# Patient Record
Sex: Male | Born: 1975 | Race: White | Hispanic: No | Marital: Married | Smoking: Current every day smoker
Health system: Southern US, Community
[De-identification: ages and names within clinical notes are randomized; demographics above are authoritative.]

## PROBLEM LIST (undated history)

## (undated) DIAGNOSIS — B1710 Acute hepatitis C without hepatic coma: Secondary | ICD-10-CM

## (undated) DIAGNOSIS — F419 Anxiety disorder, unspecified: Secondary | ICD-10-CM

## (undated) DIAGNOSIS — F410 Panic disorder [episodic paroxysmal anxiety] without agoraphobia: Secondary | ICD-10-CM

## (undated) DIAGNOSIS — K219 Gastro-esophageal reflux disease without esophagitis: Secondary | ICD-10-CM

## (undated) DIAGNOSIS — F32A Depression, unspecified: Secondary | ICD-10-CM

## (undated) DIAGNOSIS — I10 Essential (primary) hypertension: Secondary | ICD-10-CM

## (undated) DIAGNOSIS — F1010 Alcohol abuse, uncomplicated: Secondary | ICD-10-CM

## (undated) DIAGNOSIS — G8918 Other acute postprocedural pain: Secondary | ICD-10-CM

## (undated) DIAGNOSIS — E0500 Thyrotoxicosis with diffuse goiter without thyrotoxic crisis or storm: Secondary | ICD-10-CM

## (undated) DIAGNOSIS — E079 Disorder of thyroid, unspecified: Secondary | ICD-10-CM

## (undated) DIAGNOSIS — F17200 Nicotine dependence, unspecified, uncomplicated: Secondary | ICD-10-CM

## (undated) DIAGNOSIS — F329 Major depressive disorder, single episode, unspecified: Secondary | ICD-10-CM

## (undated) HISTORY — DX: Anxiety disorder, unspecified: F41.9

## (undated) HISTORY — PX: WISDOM TOOTH EXTRACTION: SHX21

## (undated) HISTORY — DX: Acute hepatitis C without hepatic coma: B17.10

## (undated) HISTORY — PX: MULTIPLE TOOTH EXTRACTIONS: SHX2053

## (undated) HISTORY — DX: Gastro-esophageal reflux disease without esophagitis: K21.9

## (undated) HISTORY — DX: Alcohol abuse, uncomplicated: F10.10

## (undated) HISTORY — DX: Essential (primary) hypertension: I10

## (undated) HISTORY — DX: Panic disorder (episodic paroxysmal anxiety): F41.0

## (undated) HISTORY — DX: Nicotine dependence, unspecified, uncomplicated: F17.200

## (undated) HISTORY — DX: Major depressive disorder, single episode, unspecified: F32.9

## (undated) HISTORY — DX: Thyrotoxicosis with diffuse goiter without thyrotoxic crisis or storm: E05.00

## (undated) HISTORY — DX: Depression, unspecified: F32.A

---
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
## 2002-07-13 ENCOUNTER — Inpatient Hospital Stay (HOSPITAL_COMMUNITY): Admission: EM | Admit: 2002-07-13 | Discharge: 2002-07-14 | Payer: Self-pay

## 2002-07-13 ENCOUNTER — Encounter: Payer: Self-pay | Admitting: Internal Medicine

## 2004-02-21 ENCOUNTER — Emergency Department: Payer: Self-pay | Admitting: General Practice

## 2004-05-23 ENCOUNTER — Other Ambulatory Visit: Payer: Self-pay

## 2004-05-23 ENCOUNTER — Inpatient Hospital Stay: Payer: Self-pay | Admitting: Psychiatry

## 2004-05-29 ENCOUNTER — Ambulatory Visit: Payer: Self-pay | Admitting: Unknown Physician Specialty

## 2004-06-16 ENCOUNTER — Ambulatory Visit: Payer: Self-pay | Admitting: Unknown Physician Specialty

## 2004-07-16 ENCOUNTER — Ambulatory Visit: Payer: Self-pay | Admitting: Unknown Physician Specialty

## 2007-09-08 ENCOUNTER — Emergency Department (HOSPITAL_COMMUNITY): Admission: EM | Admit: 2007-09-08 | Discharge: 2007-09-08 | Payer: Self-pay | Admitting: Emergency Medicine

## 2009-09-29 ENCOUNTER — Emergency Department (HOSPITAL_COMMUNITY): Admission: EM | Admit: 2009-09-29 | Discharge: 2009-09-29 | Payer: Self-pay | Admitting: Emergency Medicine

## 2009-10-04 ENCOUNTER — Encounter: Payer: Self-pay | Admitting: Family Medicine

## 2009-10-11 ENCOUNTER — Encounter: Payer: Self-pay | Admitting: Internal Medicine

## 2009-10-11 ENCOUNTER — Encounter: Payer: Self-pay | Admitting: Family Medicine

## 2009-11-21 ENCOUNTER — Ambulatory Visit: Payer: Self-pay | Admitting: Family Medicine

## 2009-11-21 DIAGNOSIS — Z720 Tobacco use: Secondary | ICD-10-CM

## 2009-11-21 DIAGNOSIS — I10 Essential (primary) hypertension: Secondary | ICD-10-CM | POA: Insufficient documentation

## 2009-11-21 DIAGNOSIS — R5383 Other fatigue: Secondary | ICD-10-CM

## 2009-11-21 DIAGNOSIS — F419 Anxiety disorder, unspecified: Secondary | ICD-10-CM

## 2009-11-21 DIAGNOSIS — F410 Panic disorder [episodic paroxysmal anxiety] without agoraphobia: Secondary | ICD-10-CM | POA: Insufficient documentation

## 2009-11-21 DIAGNOSIS — F329 Major depressive disorder, single episode, unspecified: Secondary | ICD-10-CM

## 2009-11-21 DIAGNOSIS — B1710 Acute hepatitis C without hepatic coma: Secondary | ICD-10-CM | POA: Insufficient documentation

## 2009-11-21 DIAGNOSIS — F1010 Alcohol abuse, uncomplicated: Secondary | ICD-10-CM | POA: Insufficient documentation

## 2009-11-21 DIAGNOSIS — R5381 Other malaise: Secondary | ICD-10-CM

## 2009-11-28 ENCOUNTER — Encounter (INDEPENDENT_AMBULATORY_CARE_PROVIDER_SITE_OTHER): Payer: Self-pay | Admitting: *Deleted

## 2009-12-26 ENCOUNTER — Telehealth: Payer: Self-pay | Admitting: Gastroenterology

## 2009-12-26 ENCOUNTER — Encounter (INDEPENDENT_AMBULATORY_CARE_PROVIDER_SITE_OTHER): Payer: Self-pay | Admitting: *Deleted

## 2009-12-27 ENCOUNTER — Ambulatory Visit: Payer: Self-pay | Admitting: Family Medicine

## 2009-12-27 ENCOUNTER — Telehealth (INDEPENDENT_AMBULATORY_CARE_PROVIDER_SITE_OTHER): Payer: Self-pay | Admitting: *Deleted

## 2010-01-01 LAB — CONVERTED CEMR LAB
AST: 88 units/L — ABNORMAL HIGH (ref 0–37)
Albumin: 3.7 g/dL (ref 3.5–5.2)
Alkaline Phosphatase: 128 units/L — ABNORMAL HIGH (ref 39–117)
Basophils Absolute: 0.1 10*3/uL (ref 0.0–0.1)
Bilirubin, Direct: 0.1 mg/dL (ref 0.0–0.3)
Calcium: 9.6 mg/dL (ref 8.4–10.5)
Cholesterol: 164 mg/dL (ref 0–200)
Creatinine, Ser: 0.8 mg/dL (ref 0.4–1.5)
Direct LDL: 77.2 mg/dL
Eosinophils Absolute: 0.3 10*3/uL (ref 0.0–0.7)
GFR calc non Af Amer: 113.14 mL/min (ref >60)
Glucose, Bld: 92 mg/dL (ref 70–99)
HDL: 33.2 mg/dL — ABNORMAL LOW (ref >39.00)
Hemoglobin: 16.1 g/dL (ref 13.0–17.0)
Lymphocytes Relative: 24.5 % (ref 12.0–46.0)
MCHC: 34.8 g/dL (ref 30.0–36.0)
Monocytes Relative: 14.1 % — ABNORMAL HIGH (ref 3.0–12.0)
Neutro Abs: 4.9 10*3/uL (ref 1.4–7.7)
Neutrophils Relative %: 56.7 % (ref 43.0–77.0)
RDW: 13.3 % (ref 11.5–14.6)
Sodium: 139 meq/L (ref 135–145)
Total Bilirubin: 0.6 mg/dL (ref 0.3–1.2)
Total CHOL/HDL Ratio: 5
Triglycerides: 405 mg/dL — ABNORMAL HIGH (ref 0.0–149.0)

## 2010-01-15 ENCOUNTER — Telehealth: Payer: Self-pay | Admitting: Family Medicine

## 2010-02-01 ENCOUNTER — Encounter: Payer: Self-pay | Admitting: Family Medicine

## 2010-02-01 ENCOUNTER — Ambulatory Visit: Payer: Self-pay | Admitting: Gastroenterology

## 2010-02-27 ENCOUNTER — Ambulatory Visit (HOSPITAL_COMMUNITY)
Admission: RE | Admit: 2010-02-27 | Discharge: 2010-02-27 | Payer: Self-pay | Source: Home / Self Care | Attending: Gastroenterology | Admitting: Gastroenterology

## 2010-04-05 ENCOUNTER — Encounter: Payer: Self-pay | Admitting: Family Medicine

## 2010-04-05 ENCOUNTER — Ambulatory Visit
Admission: RE | Admit: 2010-04-05 | Discharge: 2010-04-05 | Payer: Self-pay | Source: Home / Self Care | Attending: Gastroenterology | Admitting: Gastroenterology

## 2010-04-05 DIAGNOSIS — B182 Chronic viral hepatitis C: Secondary | ICD-10-CM

## 2010-04-17 NOTE — Letter (Signed)
Summary: New Patient letter  [facility]  [address] [place], [place] [address]   Phone: [phone]  Fax: [phone]       12/26/2009 MRN: [id number]  Chad Wilson [address] [place], [place]  [address]  Dear Chad Wilson,  Welcome to the Gastroenterology Division at [facility].    You are scheduled to see Dr.  Henrene Pastor  on 02/05/10  at 9:30 am  on the 3rd floor at [facility].  We ask that you try to arrive at our office 15 minutes prior to your appointment time to allow for check-in.  We would like you to complete the enclosed self-administered evaluation form prior to your visit and bring it with you on the day of your appointment.  We will review it with you.  Also, please bring a complete list of all your medications or, if you prefer, bring the medication bottles and we will list them.  Please bring your insurance card so that we may make a copy of it.  If your insurance requires a referral to see a specialist, please bring your referral form from your primary care physician.  Co-payments are due at the time of your visit and may be paid by cash, check or credit card.     Your office visit will consist of a consult with your physician (includes a physical exam), any laboratory testing he/she may order, scheduling of any necessary diagnostic testing (e.g. x-ray, ultrasound, CT-scan), and scheduling of a procedure (e.g. Endoscopy, Colonoscopy) if required.  Please allow enough time on your schedule to allow for any/all of these possibilities.    If you cannot keep your appointment, please call [phone] to cancel or reschedule prior to your appointment date.  This allows [place] the opportunity to schedule an appointment for another patient in need of care.  If you do not cancel or reschedule by 5 p.m. the business day prior to your appointment date, you will be charged a $50.00 late cancellation/no-show fee.    Thank you for  choosing [facility] for your medical needs.  We appreciate the opportunity to care for you.  Please visit [place] at our website  to learn more about our practice.                     Sincerely,                                                             The Gastroenterology Division

## 2010-04-17 NOTE — Progress Notes (Signed)
Summary: triage  Phone Note Call from Patient Call back at Home Phone [phone]   Caller: wife, Ivin Booty Call For: Dr. Ardis Hughs (doctor of the day) Reason for Call: Talk to Nurse Summary of Call: wife called ro resch NOS appt with Dr. Deatra Ina... no GI hx with our office... no preference of doctor... would like to be worked in for inflamed liver and not have to wait until December Initial call taken by: Lucien Mons,  December 26, 2009 3:21 PM  Follow-up for Phone Call        I consulted Dr Ardis Hughs and he advises pt to be scheduled an NGI with the first available [facility].  left message on machine to call back  Follow-up by: Christian Mate CMA Deborra Medina),  December 26, 2009 3:37 PM  Additional Follow-up for Phone Call Additional follow up Details #1::        pt scheduled for 02/05/10 with Dr Henrene Pastor new pt paperwork mailed Additional Follow-up by: Christian Mate CMA Deborra Medina),  December 26, 2009 4:55 PM

## 2010-04-17 NOTE — Letter (Signed)
Summary: Medical [facility]ty [facility]  Medical [facility]ty Services   Imported By: Phillis Knack 02/20/2010 12:18:57  _____________________________________________________________________  External Attachment:    Type:   Image     Comment:   External Document

## 2010-04-17 NOTE — Progress Notes (Signed)
Summary: needs referral to hepatitis clinic  Phone Note Call from Patient Call back at Home Phone [phone]   Caller: Patient Call For: Eliezer Lofts MD Summary of Call: Pt wants referral to Hep C clinic, per last lab results.

## 2010-04-17 NOTE — Progress Notes (Signed)
Summary: cx apt-- [facility]  Phone Note Outgoing Call Call back at [facility] Phone [phone]   Call placed by: Christian Mate CMA Deborra Medina),  December 27, 2009 4:23 PM Summary of Call: pt appt with Dr Henrene Pastor needs to be cx due to Hep C and we do not treat.  left message on pt machine Initial call taken by: Christian Mate CMA Deborra Medina),  December 27, 2009 4:24 PM  Follow-up for Phone Call        pt returned call and is aware that the pt needs to be seen by the [facility] he will get a referral from Dr Diona Browner Follow-up by: Christian Mate CMA Deborra Medina),  December 27, 2009 4:33 PM

## 2010-04-17 NOTE — Letter (Signed)
Summary: [place] No Show Letter  [place] [facility]  [address] [place], [place] [address]   Phone: [phone]  Fax: [phone]    11/28/2009 MRN: [id number]  Chad Wilson [address] [place], [place]  [address]   Dear [name],   Our records indicate that you missed your scheduled appointment with ___Lab__________________ on ____9-9-11________.  Please contact this office to reschedule your appointment as soon as possible.  It is important that you keep your scheduled appointments with your physician, so we can provide you the best care possible.  Please be advised that there may be a charge for "no show" appointments.    Sincerely,   [place] at [facility]

## 2010-04-17 NOTE — Assessment & Plan Note (Signed)
Summary: NEW PT TO EST/CLE   Vital Signs:  Patient profile:   35 year old male Height:      67.5 inches Weight:      166 pounds BMI:     25.71 Temp:     98.8 degrees F oral Pulse rate:   88 / minute Pulse rhythm:   regular BP sitting:   140 / 60  (left arm) Cuff size:   large  Vitals Entered By: Edwin Dada CMA Deborra Medina) (November 21, 2009 3:48 PM) CC: new patient to establish care   History of Present Illness: 8 weeks began having lightheadedness, presyncopal.  Fatigued. Went ER...dx dehydrated, HTN and Anxiety....given iVF, potassium pill and started on BP medicaiton.  EKG nml.  Started on lisinopril 20 mg daily.. Started on buspirone 1/2 tab by mouth 3 times per day....using every few days.  Helps minimally and gives him SE   Since then has felt nauseous and weak intermittantly. Occ heart racing. Toes and fingers tingling. Panic spells. Some stress in life. No depression, no SI.  Has always had some insomnia. Significant decrease in motivation. Relaxing and deep breathing helps.  Seen at Urgent care in 09/2009... labs done showed increased LFTs..AST 252 and ALT 121 Fasting CBG ..blood slightly elevated at 112 Nml kidney. Nml potassium. Hepatitis panel showed positive hep C. HAs tattoos denies needle use. Has appt set with Hepatitis clinic [facility] [address].   Preventive Screening-Counseling & Management  Alcohol-Tobacco     Smoking Status: current  Caffeine-Diet-Exercise     Does Patient Exercise: no      Drug Use:  yes.    Allergies (verified): No Known Drug Allergies  Past History:  Past medical, surgical, family and social histories (including risk factors) reviewed, and no changes noted (except as noted below).  Family History: Reviewed history and no changes required. sisters with anxiety father: no contact mother: scoliosis, panic attakcs, anxiety  Social History: Reviewed history and no changes  required. Occupation:floor work Single No current sexual activity Current Smoker 1 ppd Alcohol use-yes..hx of 12 pack per night...now 12 pack in a week Drug use-yes, cocaine, no injected meds Regular exercise-no Diet: fruits, and veggies, water, pwerade, occ sweet teaOccupation:  employed Smoking Status:  current Drug Use:  yes Does Patient Exercise:  no  Review of Systems General:  Complains of fatigue; denies fever. CV:  Complains of chest pain or discomfort; associated with panic episodes. Resp:  Denies shortness of breath. GI:  Denies abdominal pain, bloody stools, constipation, and diarrhea. GU:  Denies dysuria. Derm:  Denies lesion(s). Endo:  Complains of heat intolerance.  Physical Exam  General:  anxious appearing male in mild distress Eyes:  No corneal or conjunctival inflammation noted. EOMI. Perrla. Funduscopic exam benign, without hemorrhages, exudates or papilledema. Vision grossly normal. Ears:  External ear exam shows no significant lesions or deformities.  Otoscopic examination reveals clear canals, tympanic membranes are intact bilaterally without bulging, retraction, inflammation or discharge. Hearing is grossly normal bilaterally. Nose:  External nasal examination shows no deformity or inflammation. Nasal mucosa are pink and moist without lesions or exudates. Mouth:  Oral mucosa and oropharynx without lesions or exudates.  Teeth in good repair. Neck:  no carotid bruit or thyromegaly no cervical or supraclavicular lymphadenopathy  Lungs:  Normal respiratory effort, chest expands symmetrically. Lungs are clear to auscultation, no crackles or wheezes. Heart:  Normal rate and regular rhythm. S1 and S2 normal without gallop, murmur, click, rub or other  extra sounds. Abdomen:  mild heptomegaly, no ttp, NABS, no masses Msk:  No deformity or scoliosis noted of thoracic or lumbar spine.   Pulses:  R and L posterior tibial pulses are full and equal bilaterally   Extremities:  no edema  Neurologic:  No cranial nerve deficits noted. Station and gait are normal. Plantar reflexes are down-going bilaterally. DTRs are symmetrical throughout. Sensory, motor and coordinative functions appear intact. Skin:  Intact without suspicious lesions or rashes Psych:  Oriented X3, poor eye contact, moderately anxious, easily distracted, and poor concentration.     Impression & Recommendations:  Problem # 1:  ANXIETY (ICD-300.00) Poor control..start sertraline 25 mg...increase to 50 mg daily as tolerated.  Follow up closely.  The following medications were removed from the medication list:    Buspirone Hcl 30 Mg Tabs (Buspirone hcl) .Marland Kitchen... Take 1/2-1 by mouth three times a day as needed His updated medication list for this problem includes:    Sertraline Hcl 25 Mg Tabs (Sertraline hcl) .Marland Kitchen... 1 tab by mouth daily x 1 week, if tolerating increase to 2 tabs by mouth at bedtime  Problem # 2:  HYPERTENSION, BENIGN ESSENTIAL (ICD-401.1) Moderate control..continue lisinopril and continue to follow as anxiety improved.  His updated medication list for this problem includes:    Lisinopril 20 Mg Tabs (Lisinopril) .Marland Kitchen... Take 1 by mouth once daily  Problem # 3:  HEPATITIS C (ICD-070.51) New diagnosis.Marland Kitchenlikely cause for increase LFTS as well as #4 . Keep appt with hep clinic possible cause of nausea.  Reeval LFTS.  Problem # 4:  ALCOHOL ABUSE (ICD-305.00) Decrease alcohol use..limit then quit all together given LFTs. This may be contributing as well.   Problem # 5:  FATIGUE (ICD-780.79) Eval with labsTSH , B12, cbc. Likely due to combination of other health issues.   Complete Medication List: 1)  Lisinopril 20 Mg Tabs (Lisinopril) .... Take 1 by mouth once daily 2)  Sertraline Hcl 25 Mg Tabs (Sertraline hcl) .Marland Kitchen.. 1 tab by mouth daily x 1 week, if tolerating increase to 2 tabs by mouth at bedtime  Patient Instructions: 1)  Make sure you keep hep C doctor  appointment. 2)  Fasting lipids, TSH, B12, cbc, CMET Dx v77.91, 780.79 as soon as possible. 3)  Start sertraline for mood , start 1 tab at bedtime, increase to 2 tabs after 1 week if no side effects. 4)  Decrease alcohol intake further to improve liver function. 5)  Continue lisinopril. 6)  Stop Buspar. 7)  Follow up in 1 month, mood.  Prescriptions: SERTRALINE HCL 25 MG TABS (SERTRALINE HCL) 1 tab by mouth daily x 1 week, if tolerating increase to 2 tabs by mouth at bedtime  #60 x 3   Entered and Authorized by:   Eliezer Lofts MD   Signed by:   Eliezer Lofts MD on 11/21/2009   Method used:   Electronically to        [facility] [phone]* (retail)       [address]       [place], [place]  [address]       Ph: [phone]       Fax: [phone]   RxID:   [id number]   Current Allergies (reviewed today): No known allergies   TD Result Date:  03/18/2004 TD Result:  given TD Next Due:  10 yr

## 2010-05-03 NOTE — Letter (Signed)
Summary: [facility]  [facility]   Imported By: Jamelle Haring 04/24/2010 09:22:01  _____________________________________________________________________  External Attachment:    Type:   Image     Comment:   External Document

## 2010-05-21 ENCOUNTER — Other Ambulatory Visit: Payer: Self-pay | Admitting: Family Medicine

## 2010-05-21 ENCOUNTER — Ambulatory Visit (INDEPENDENT_AMBULATORY_CARE_PROVIDER_SITE_OTHER): Payer: Self-pay | Admitting: Family Medicine

## 2010-05-21 ENCOUNTER — Encounter: Payer: Self-pay | Admitting: Family Medicine

## 2010-05-21 DIAGNOSIS — B1710 Acute hepatitis C without hepatic coma: Secondary | ICD-10-CM

## 2010-05-21 DIAGNOSIS — R17 Unspecified jaundice: Secondary | ICD-10-CM

## 2010-05-21 DIAGNOSIS — F1010 Alcohol abuse, uncomplicated: Secondary | ICD-10-CM

## 2010-05-21 LAB — CONVERTED CEMR LAB: LDH: 153 units/L (ref 94–250)

## 2010-05-22 LAB — CBC WITH DIFFERENTIAL/PLATELET
Basophils Absolute: 0.2 10*3/uL — ABNORMAL HIGH (ref 0.0–0.1)
Basophils Relative: 3.1 % — ABNORMAL HIGH (ref 0.0–3.0)
Eosinophils Absolute: 0.3 10*3/uL (ref 0.0–0.7)
HCT: 45.7 % (ref 39.0–52.0)
Hemoglobin: 16.1 g/dL (ref 13.0–17.0)
Lymphocytes Relative: 39.3 % (ref 12.0–46.0)
Lymphs Abs: 3.2 10*3/uL (ref 0.7–4.0)
MCHC: 35.2 g/dL (ref 30.0–36.0)
MCV: 95.5 fl (ref 78.0–100.0)
Neutro Abs: 3.4 10*3/uL (ref 1.4–7.7)
RBC: 4.79 Mil/uL (ref 4.22–5.81)
RDW: 13.3 % (ref 11.5–14.6)

## 2010-05-22 LAB — HEPATIC FUNCTION PANEL
AST: 68 U/L — ABNORMAL HIGH (ref 0–37)
Albumin: 4.1 g/dL (ref 3.5–5.2)
Alkaline Phosphatase: 106 U/L (ref 39–117)
Total Protein: 7 g/dL (ref 6.0–8.3)

## 2010-05-22 LAB — BASIC METABOLIC PANEL
CO2: 30 mEq/L (ref 19–32)
Chloride: 104 mEq/L (ref 96–112)
Glucose, Bld: 70 mg/dL (ref 70–99)
Potassium: 4.3 mEq/L (ref 3.5–5.1)
Sodium: 139 mEq/L (ref 135–145)

## 2010-05-28 LAB — CBC
HCT: 45.8 % (ref 39.0–52.0)
Hemoglobin: 16.5 g/dL (ref 13.0–17.0)
RBC: 4.91 MIL/uL (ref 4.22–5.81)
WBC: 5.2 10*3/uL (ref 4.0–10.5)

## 2010-05-28 LAB — PROTIME-INR
INR: 0.94 (ref 0.00–1.49)
Prothrombin Time: 12.8 seconds (ref 11.6–15.2)

## 2010-05-28 LAB — APTT: aPTT: 28 seconds (ref 24–37)

## 2010-05-29 NOTE — Assessment & Plan Note (Signed)
Summary: EYES ARE YELLOW TODAY- HAS HEP C   Vital Signs:  Patient profile:   35 year old male Height:      67.5 inches Weight:      176.25 pounds BMI:     27.30 Temp:     98.7 degrees F oral Pulse rate:   80 / minute Pulse rhythm:   regular BP sitting:   120 / 70  (left arm) Cuff size:   regular  Vitals Entered By: Zenda Alpers CMA Deborra Medina) (May 21, 2010 3:50 PM)  History of Present Illness: Chief complaint Eyes are yellow and has hep c  Scleral Icterus:  Feels tired and weak.  Has been feeling that for six months or so.  Now feeling bad and tired.  over the past few days, patient has developed  scleral icterus, and his companion also believes that he  has a slightly yellowish stent  to his skin tone compared to his baseline. This is my 1st time evaluating the patient.  He does have known hepatitis C, and he also used to be a very heavy drinker. Right now, he admits to drinking about 12 beers a week. He has not had any excessive intake or binge drinking recently.  05/31/2010: has an appt with Hepatology. at that point he is going to begin [facility] therapy    Allergies (verified): No Known Drug Allergies  Past History:  Past medical, surgical, family and social histories (including risk factors) reviewed, and no changes noted (except as noted below).  Past Medical History: TOBACCO ABUSE (ICD-305.1) ALCOHOL ABUSE (ICD-305.00) HYPERTENSION, BENIGN ESSENTIAL (ICD-401.1) PANIC ATTACK (ICD-300.01) HEPATITIS C (ICD-070.51) ANXIETY (ICD-300.00)    Family History: Reviewed history from 11/21/2009 and no changes required. sisters with anxiety father: no contact mother: scoliosis, panic attakcs, anxiety  Social History: Reviewed history from 11/21/2009 and no changes required. Occupation:floor work Single No current sexual activity Current Smoker 1 ppd Alcohol use-yes..hx of 12 pack per night...now 12 pack in a week Drug use-yes, cocaine, no injected meds Regular  exercise-no Diet: fruits, and veggies, water, pwerade, occ sweet tea  Review of Systems      See HPI General:  Complains of fatigue and weakness; denies chills, fever, and sweats.  Physical Exam  General:  Well-developed,well-nourished,in no acute distress; alert,appropriate and cooperative throughout examination Head:  Normocephalic and atraumatic without obvious abnormalities. No apparent alopecia or balding. Eyes:  sclera slightly yellowish Ears:  External ear exam shows no significant lesions or deformities.  Otoscopic examination reveals clear canals, tympanic membranes are intact bilaterally without bulging, retraction, inflammation or discharge. Hearing is grossly normal bilaterally. Mouth:  Oral mucosa and oropharynx without lesions or exudates.   Neck:  No deformities, masses, or tenderness noted. Lungs:  Normal respiratory effort, chest expands symmetrically. Lungs are clear to auscultation, no crackles or wheezes. Heart:  Normal rate and regular rhythm. S1 and S2 normal without gallop, murmur, click, rub or other extra sounds. Skin:  not appreciated to be yellow Psych:  Cognition and judgment appear intact. Alert and cooperative with normal attention span and concentration. No apparent delusions, illusions, hallucinations   Impression & Recommendations:  Problem # 1:  JAUNDICE (ICD-782.4) Assessment New will evaluate to ensure no gross decompensation of liver hepatology eval in a little over a week LDH, eval for blood breakdown check cbc platelets  if numbers all reasonable, i think can wait for hepatology consult.  Orders: Venipuncture ([address]) TLB-BMP (Basic Metabolic Panel-BMET) (60454-[id number]) TLB-CBC Platelet - w/Differential (85025-CBCD) TLB-Hepatic/Liver Function  Pnl (80076-HEPATIC) T- * Misc. Laboratory test [phone]) Specimen Handling ([address])  Problem # 2:  HEPATITIS C (ICD-070.51)  Problem # 3:  ALCOHOL ABUSE (ICD-305.00) for now, i have asked him to stop  drinking completely   Orders Added: 1)  Venipuncture [63845] 2)  TLB-BMP (Basic Metabolic Panel-BMET) [36468-[id number]] 3)  TLB-CBC Platelet - w/Differential [85025-CBCD] 4)  TLB-Hepatic/Liver Function Pnl [80076-HEPATIC] 5)  T- * Misc. Laboratory test [phone] 6)  Specimen Handling [99000] 7)  Est. Patient Level IV [37048]    Current Allergies (reviewed today): No known allergies

## 2010-06-02 LAB — COMPREHENSIVE METABOLIC PANEL
AST: 102 U/L — ABNORMAL HIGH (ref 0–37)
Albumin: 3.7 g/dL (ref 3.5–5.2)
Alkaline Phosphatase: 123 U/L — ABNORMAL HIGH (ref 39–117)
BUN: 6 mg/dL (ref 6–23)
Chloride: 105 mEq/L (ref 96–112)
GFR calc Af Amer: >60 mL/min (ref >60)
Potassium: 3.4 mEq/L — ABNORMAL LOW (ref 3.5–5.1)
Total Bilirubin: 0.5 mg/dL (ref 0.3–1.2)
Total Protein: 6.8 g/dL (ref 6.0–8.3)

## 2010-06-02 LAB — CBC
MCV: 97.1 fL (ref 78.0–100.0)
Platelets: 159 10*3/uL (ref 150–400)
RBC: 5.07 MIL/uL (ref 4.22–5.81)
RDW: 13.2 % (ref 11.5–15.5)
WBC: 6.3 10*3/uL (ref 4.0–10.5)

## 2010-06-02 LAB — RAPID URINE DRUG SCREEN, HOSP PERFORMED
Amphetamines: NOT DETECTED
Barbiturates: NOT DETECTED
Benzodiazepines: NOT DETECTED
Opiates: NOT DETECTED
Tetrahydrocannabinol: NOT DETECTED

## 2010-06-02 LAB — URINALYSIS, ROUTINE W REFLEX MICROSCOPIC
Bilirubin Urine: NEGATIVE
Glucose, UA: NEGATIVE mg/dL
Hgb urine dipstick: NEGATIVE
Protein, ur: NEGATIVE mg/dL
Specific Gravity, Urine: 1.005 (ref 1.005–1.030)

## 2010-06-02 LAB — HEPATITIS PANEL, ACUTE
HCV Ab: REACTIVE — AB
Hep A IgM: NEGATIVE
Hepatitis B Surface Ag: NEGATIVE

## 2010-06-02 LAB — CARBOXYHEMOGLOBIN: Methemoglobin: 0.6 % (ref 0.0–1.5)

## 2010-06-02 LAB — POCT CARDIAC MARKERS: Troponin i, poc: <0.05 ng/mL (ref 0.00–0.09)

## 2010-06-02 LAB — PROTIME-INR: Prothrombin Time: 11.9 seconds (ref 11.6–15.2)

## 2010-08-03 NOTE — Discharge Summary (Signed)
NAME:  Chad Wilson, Chad Wilson                             ACCOUNT NO.:  [id number]   MEDICAL RECORD NO.:  [id number]                   PATIENT TYPE:  INP   LOCATION:  3304                                 FACILITY:  [facility]   PHYSICIAN:  Monia Sabal. Jobe Igo, M.D.               DATE OF BIRTH:  1975-06-10   DATE OF ADMISSION:  07/13/2002  DATE OF DISCHARGE:  07/14/2002                                 DISCHARGE SUMMARY   PRIMARY CARE PHYSICIAN:  Unknown.   DISCHARGE DIAGNOSES:  1. Suicide attempt with multi-drug overdose.  2. Major depressive disorder, moderate.  3. Alcohol dependence.  4. Polysubstance abuse.   DISCHARGE MEDICATIONS:  None.   DISPOSITION:  The patient was discharged home with his family in stable  condition.   FOLLOWUP:  The patient is to follow up with [facility] for further  management of his psychiatric problems.   ISSUES FOR FOLLOWUP:  Certainly, this patient will require further  management of his psychiatric problems including depression and alcohol  dependence.  Additionally, I feel that the patient would benefit from  establishment of a primary care physician.  He reports that his wife sees a  family practice physician he is also scheduled to see.  He is welcome to be  seen in the outpatient clinic if he is unable to be seen in the [facility].   CONSULTS:  Dr. Felizardo Hoffmann from psychiatry was consulted regarding this  patient.  We certainly appreciate his assistance in his care.   ADMITTING HISTORY AND PHYSICAL WITH LABORATORIES:  The patient is a 35-year-  old Caucasian male with past medical history significant for depression, who  was brought to the emergency department by his family secondary to his  suicide attempt.  The patient was in a normal state of health until the  evening prior to admission.  Per report, he went to his mother's home and  took pills from her medicine cabinet.  Per his wife, she thinks he was doing  this while  talking to her on the telephone.  They were reportedly in some  form of argument regarding their relationship.  He then awoke his sister and  told her what he had done .  She awoke their mother and brought the patient  to the emergency department.   Per the patient's wife and mother, things have been somewhat strange since  approximately September of 2003, when the patient began having decreased  work with his job and increased financial stressors.  Also, the patient's  family reports increased stress at home with problems with trust and  happiness in the family.  At some point after the initial increased stress,  the patient began using drugs and alcohol intermittently and would be  increasingly agitated at times.  Approximately two months prior to the date  of admission, the patient went to [place] to  seek help and to get  life in order.  He was recommended outpatient therapy but did not follow  up.  He was to go to counseling with his wife as well.  The patient is not  taking any medications at this time.   PHYSICAL EXAMINATION:  VITAL SIGNS:  Temperature 99.1, pulse 90, blood  pressure 150/75, respiratory rate 20, O2 saturation 92% on room air.  GENERAL:  The patient was nontoxic and in no acute distress.  He was alert  and oriented x3.  HEENT:  Pupils were equal, round and reactive to light.  Sclerae were clear.  Extraocular muscles were intact.  Nares were patent.  Oropharynx was clear  without erythema or exudate.  NECK:  Supple without lymphadenopathy or JVD.  CARDIOVASCULAR:  Regular rate and rhythm without murmurs, gallops or rubs.  LUNGS:  __________ wheezing and a few rhonchi bilaterally.  Otherwise clear.  ABDOMEN:  Soft, nontender and nondistended with normal bowel sounds.  EXTREMITIES:  No clubbing, cyanosis, or edema is appreciated.  NEUROLOGIC:  The patient demonstrated 5/5 strength in bilateral upper and  lower extremities.  Sensation was intact throughout.   Cranial nerves II-XII  were intact bilaterally.  Cerebellar function was within normal limits.  PSYCHIATRIC:  The patient was dressed in a hospital gown, lying in the bed.  His behavior was somewhat withdrawn.  His speech was limited.  His mood was  described as okay.  His affect was somewhat flattened.  Per report, it was  labile in the emergency department.  His thought process and content are  abnormal.  His insight and judgment and concentration are poor.   LABORATORY DATA:  On admission, white blood cell count 11.4, hemoglobin  16.4, hematocrit 46.3, platelets 287,000; sodium 138, potassium 3.0,  chloride 104, bicarb 21, BUN 6, creatinine 0.9, glucose 127, calcium 9.1,  total bilirubin 0.5, alkaline phosphatase 79, AST 25, ALT 43, total protein  7.3, albumin 4.2.  ABG revealed pH 7.42, PCO2 of 33 and PO2 of 83, bicarb  21, O2 saturation 96%.  ETOH level was 204.  Acetaminophen level was less  than 10.  Aspirin level was less than 4.  UDS was positive for cocaine but  negative for benzodiazepines and tricyclics.  A urinalysis was within normal  limits.  TSH was 2.406.   EKG revealed evidence of a right axis deviation.  Normal sinus rhythm at 90  beats per minute and there were normal intervals.  No ST elevation of  depression and T wave inversion consistent with ischemia.   Initial cardiac enzymes were negative.   HOSPITAL COURSE:  APPARENT SUICIDE ATTEMPT WITH MULTIPLE DRUG OVERDOSE:  Per  report of the family, the patient took multiple drugs including Valium,  Baclofen, Synthroid, Excedrin, alcohol and cocaine.  The primary  complication secondary to overdose on these medications involves CNS and  respiratory depression.  There were significant cardiac arrhythmias  possible.  As such, the patient was admitted to the stepdown unit for close  monitoring on telemetry.  His mental status continued to improve over his admission.  His laboratory data was all stable.  The patient did  not suffer  any complications secondary to his multiple drug consumption.  He was stable  throughout the admission.  Secondary to the overdose, psychiatry was  consulted.  Dr. Rhona Raider came to evaluate the patient and felt that he had  major depressive disorder, moderate.  He also felt there was an element of  alcohol  dependence.  The patient was felt to be safe to be discharged home with  outpatient followup.  He was given instructions on a number to call in case  he felt that he was at risk of harming himself or others.  The patient is to  follow up with Behavioral Health for further management of his psychiatric  problems.       Julio Alm, M.D.                  Monia Sabal Jobe Igo, M.D.    CM/MEDQ  D:  08/03/2002  T:  08/04/2002  Job:  [id number]   cc:   Felizardo Hoffmann, M.D.  [address] [place], [place] [address]  Fax: [phone]

## 2010-12-06 ENCOUNTER — Emergency Department (HOSPITAL_COMMUNITY)
Admission: EM | Admit: 2010-12-06 | Discharge: 2010-12-07 | Disposition: A | Discharge status: Home / Self Care | Payer: Self-pay | Attending: Emergency Medicine | Admitting: Emergency Medicine

## 2010-12-06 DIAGNOSIS — R112 Nausea with vomiting, unspecified: Secondary | ICD-10-CM | POA: Insufficient documentation

## 2010-12-06 DIAGNOSIS — F3289 Other specified depressive episodes: Secondary | ICD-10-CM | POA: Insufficient documentation

## 2010-12-06 DIAGNOSIS — R6883 Chills (without fever): Secondary | ICD-10-CM | POA: Insufficient documentation

## 2010-12-06 DIAGNOSIS — R42 Dizziness and giddiness: Secondary | ICD-10-CM | POA: Insufficient documentation

## 2010-12-06 DIAGNOSIS — R945 Abnormal results of liver function studies: Secondary | ICD-10-CM | POA: Insufficient documentation

## 2010-12-06 DIAGNOSIS — R5381 Other malaise: Secondary | ICD-10-CM | POA: Insufficient documentation

## 2010-12-06 DIAGNOSIS — F329 Major depressive disorder, single episode, unspecified: Secondary | ICD-10-CM | POA: Insufficient documentation

## 2010-12-06 DIAGNOSIS — Z8619 Personal history of other infectious and parasitic diseases: Secondary | ICD-10-CM | POA: Insufficient documentation

## 2010-12-06 LAB — CBC
HCT: 43 % (ref 39.0–52.0)
Hemoglobin: 15.7 g/dL (ref 13.0–17.0)
MCV: 88.7 fL (ref 78.0–100.0)
Platelets: 181 10*3/uL (ref 150–400)
RBC: 4.85 MIL/uL (ref 4.22–5.81)
WBC: 9 10*3/uL (ref 4.0–10.5)

## 2010-12-06 LAB — COMPREHENSIVE METABOLIC PANEL
ALT: 147 U/L — ABNORMAL HIGH (ref 0–53)
Alkaline Phosphatase: 111 U/L (ref 39–117)
BUN: 7 mg/dL (ref 6–23)
CO2: 27 mEq/L (ref 19–32)
Calcium: 9.4 mg/dL (ref 8.4–10.5)
GFR calc Af Amer: >60 mL/min (ref >60)
GFR calc non Af Amer: >60 mL/min (ref >60)
Glucose, Bld: 88 mg/dL (ref 70–99)
Sodium: 139 mEq/L (ref 135–145)

## 2010-12-06 LAB — DIFFERENTIAL
Lymphocytes Relative: 29 % (ref 12–46)
Lymphs Abs: 2.6 10*3/uL (ref 0.7–4.0)
Neutro Abs: 5.2 10*3/uL (ref 1.7–7.7)
Neutrophils Relative %: 58 % (ref 43–77)

## 2010-12-06 LAB — POCT I-STAT, CHEM 8
BUN: 6 mg/dL (ref 6–23)
Calcium, Ion: 1.22 mmol/L (ref 1.12–1.32)
Chloride: 103 mEq/L (ref 96–112)
Creatinine, Ser: 0.7 mg/dL (ref 0.50–1.35)

## 2010-12-13 LAB — ETHANOL: Alcohol, Ethyl (B): 156 — ABNORMAL HIGH

## 2010-12-13 LAB — RAPID URINE DRUG SCREEN, HOSP PERFORMED
Opiates: NOT DETECTED
Tetrahydrocannabinol: NOT DETECTED

## 2010-12-13 LAB — LITHIUM LEVEL: Lithium Lvl: 0.37 — ABNORMAL LOW

## 2010-12-13 LAB — POCT I-STAT, CHEM 8
Chloride: 99
HCT: 53 — ABNORMAL HIGH
Potassium: 3.5

## 2010-12-24 ENCOUNTER — Ambulatory Visit (INDEPENDENT_AMBULATORY_CARE_PROVIDER_SITE_OTHER): Payer: Self-pay | Admitting: Family Medicine

## 2010-12-24 ENCOUNTER — Encounter: Payer: Self-pay | Admitting: Family Medicine

## 2010-12-24 VITALS — BP 120/84 | HR 86 | Temp 98.5°F | Wt 175.5 lb

## 2010-12-24 DIAGNOSIS — B1710 Acute hepatitis C without hepatic coma: Secondary | ICD-10-CM

## 2010-12-24 DIAGNOSIS — R110 Nausea: Secondary | ICD-10-CM

## 2010-12-24 DIAGNOSIS — R079 Chest pain, unspecified: Secondary | ICD-10-CM

## 2010-12-24 DIAGNOSIS — F410 Panic disorder [episodic paroxysmal anxiety] without agoraphobia: Secondary | ICD-10-CM

## 2010-12-24 DIAGNOSIS — R5383 Other fatigue: Secondary | ICD-10-CM

## 2010-12-24 LAB — HEPATIC FUNCTION PANEL
AST: 72 U/L — ABNORMAL HIGH (ref 0–37)
Alkaline Phosphatase: 106 U/L (ref 39–117)
Total Bilirubin: 0.6 mg/dL (ref 0.3–1.2)

## 2010-12-24 LAB — T3, FREE: T3, Free: 3 pg/mL (ref 2.3–4.2)

## 2010-12-24 MED ORDER — OMEPRAZOLE 20 MG PO CPDR: 20.0000 mg | DELAYED_RELEASE_CAPSULE | Freq: Every day | ORAL | Status: DC

## 2010-12-24 NOTE — Progress Notes (Signed)
Subjective:    Patient ID: Chad Wilson, male    DOB: [birth year], 35 y.o.   MRN: [id number]  HPI  Chad Wilson, a 35 y.o. male presents today in the office for the following:    For a few weeks: Has been feeling real tired, feels like battery is on E.  Now is getting worse. Feels tired and worn out.  A little chest congestion.   He is generally has been feeling poorly, has had some nausea, and rare emesis. No bilious emesis, no blood. The patient does have a history of hepatitis C. Recent AST and ALT are noted, elevated. Relatively baseline for him. He does drink approximately 1 times a week now only 1-2 or 3 drinks. Denies any other substance use. No other acute illnesses that he knows of or exposures. He did go to the emergency room, and they felt that he was dehydrated, and given 2 bags of fluid.  Some nausea and does not feel like doing anything. Some occ chest pain.  Last week was in the bed and did not feel like doing anything. A few days last week.   Multiple people in family with thyroid problems.   Palpitations and tachycardia -- then felt really tired. Heart skipping beats. Really fast for a few minutes.  CBC:    Component Value Date/Time   WBC 9.0 12/06/2010 2242   HGB 15.6 12/06/2010 2301   HCT 46.0 12/06/2010 2301   PLT 181 12/06/2010 2242   MCV 88.7 12/06/2010 2242   NEUTROABS 5.2 12/06/2010 2242   LYMPHSABS 2.6 12/06/2010 2242   MONOABS 1.0 12/06/2010 2242   EOSABS 0.2 12/06/2010 2242   BASOSABS 0.0 12/06/2010 2242   Comprehensive Metabolic Panel:    Component Value Date/Time   NA 140 12/06/2010 2301   K 3.5 12/06/2010 2301   CL 103 12/06/2010 2301   CO2 27 12/06/2010 2242   BUN 6 12/06/2010 2301   CREATININE 0.70 12/06/2010 2301   GLUCOSE 87 12/06/2010 2301   CALCIUM 9.4 12/06/2010 2242   AST 56* 12/06/2010 2242   ALT 147* 12/06/2010 2242   ALKPHOS 111 12/06/2010 2242   BILITOT 0.4 12/06/2010 2242   PROT 7.2 12/06/2010 2242   ALBUMIN 3.8 12/06/2010 2242   Lab Results    Component Value Date   LIPASE 34 12/06/2010   Patient Active Problem List  Diagnoses  . HEPATITIS C  . ANXIETY  . PANIC ATTACK  . ALCOHOL ABUSE  . TOBACCO ABUSE  . HYPERTENSION, BENIGN ESSENTIAL  . FATIGUE  . JAUNDICE   Past Medical History  Diagnosis Date  . Tobacco use disorder   . Alcohol abuse, unspecified   . Essential hypertension, benign   . Panic disorder without agoraphobia   . Acute hepatitis C without mention of hepatic coma   . Anxiety states    No past surgical history on file. History  Substance Use Topics  . Smoking status: Current Everyday Smoker  . Smokeless tobacco: Not on file  . Alcohol Use: Yes   Family History  Problem Relation Age of Onset  . Anxiety disorder Mother   . Scoliosis Mother   . Anxiety disorder Sister    No Known Allergies No current outpatient prescriptions on file prior to visit.     Review of Systems ROS: GEN: above, fatigue GI: above Pulm: No SOB Somewhat anxious Interactive and getting along well at home.  Otherwise, ROS is as per the HPI.     Objective:  Physical Exam   Physical Exam  Blood pressure 120/84, pulse 86, temperature 98.5 F (36.9 C), temperature source Oral, weight 175 lb 8 oz (79.606 kg).  GEN: WDWN, NAD, Non-toxic, A & O x 3 HEENT: Atraumatic, Normocephalic. Neck supple. No masses, No LAD. Ears and Nose: No external deformity. CV: RRR, No M/G/R. No JVD. No thrill. No extra heart sounds. PULM: CTA B, no wheezes, crackles, rhonchi. No retractions. No resp. distress. No accessory muscle use. ABD: S, NT, ND, +BS. No rebound tenderness. No HSM.  EXTR: No c/c/e NEURO Normal gait.  PSYCH: Normally interactive. Conversant. Not depressed or anxious appearing.  Calm demeanor.        Assessment & Plan:   1. Fatigue  EKG 12-Lead, EKG 12-Lead, TSH, T4, free, T3, free, Hepatic function panel, Testosterone  2. FATIGUE    3. HEPATITIS C  Hepatic function panel  4. PANIC ATTACK    5. Nausea   omeprazole (PRILOSEC) 20 MG capsule  6. Chest pain      EKG: Normal sinus rhythm. Normal axis, normal R wave progression, No acute ST elevation or depression.   I do not have a definitive diagnosis for the patient. Fatigue and often be multifactorial. We should check his liver enzymes today, as well as a thyroid and a testosterone level.  He does have some nausea, difficulty with acidic foods, and I'm going to place him on some Prilosec, have him stop all NSAIDs, decrease alcohol.  History of signifcant anxiety - EKG perfectly normal.

## 2010-12-24 NOTE — Patient Instructions (Signed)
PUD and Dyspepsia  Often caused by H. Pylori bacteria or NSAID use  1. Treat H. Pylori if present 2. Stop aspirin and NSAID use 3. If mild, OTC Zantac or Pepcid can help, treat 6-12 weeks 4. If severe, prescription likely needed, 3-6 months 5. Smoking, alcohol, caffeine can make worse 6. Don't eat foods that make you worse

## 2010-12-31 ENCOUNTER — Telehealth: Payer: Self-pay | Admitting: *Deleted

## 2010-12-31 ENCOUNTER — Ambulatory Visit: Payer: Self-pay | Admitting: Family Medicine

## 2010-12-31 NOTE — Telephone Encounter (Signed)
Labs not been listed on [facility] because Dr. Loletha Grayer just addressed today.. Please call pt directly with recs per note entered by Dr. Loletha Grayer today given he is anxious about them.

## 2010-12-31 NOTE — Telephone Encounter (Signed)
Pt is asking for his recent lab results.  Please review and advise.  He has been calling [facility] for results, and, since they havent been there, please call him him after review.

## 2010-12-31 NOTE — Telephone Encounter (Signed)
Patient advised.

## 2011-02-21 ENCOUNTER — Ambulatory Visit (INDEPENDENT_AMBULATORY_CARE_PROVIDER_SITE_OTHER): Payer: Self-pay | Admitting: Family Medicine

## 2011-02-21 DIAGNOSIS — F410 Panic disorder [episodic paroxysmal anxiety] without agoraphobia: Secondary | ICD-10-CM

## 2011-02-21 DIAGNOSIS — Z0289 Encounter for other administrative examinations: Secondary | ICD-10-CM

## 2011-02-21 NOTE — Progress Notes (Signed)
  Subjective:    Patient ID: Chad Wilson, male    DOB: 1975/07/22, 35 y.o.   MRN: [id number]  HPI No show.   Review of Systems     Objective:   Physical Exam        Assessment & Plan:

## 2011-03-28 ENCOUNTER — Telehealth: Payer: Self-pay | Admitting: Gastroenterology

## 2011-03-28 NOTE — Telephone Encounter (Signed)
I trying to find out what the status of his applications were for Pegasys/RBV/Telaprevir through the companies programs for the uninsured, I called the patient and his wife.  His wife called back and left a message at [facility]: [phone] The wife said that she received the paperwork and filled it out and sent it back but has not received any other calls. Sunnie Nielsen  I called to the number above and left a message to call us back to tell us where the applications were sent to, because we have not heard from the companies.

## 2011-05-20 ENCOUNTER — Ambulatory Visit: Payer: Self-pay | Admitting: Family Medicine

## 2011-05-20 DIAGNOSIS — Z0289 Encounter for other administrative examinations: Secondary | ICD-10-CM

## 2011-05-27 ENCOUNTER — Ambulatory Visit (INDEPENDENT_AMBULATORY_CARE_PROVIDER_SITE_OTHER): Payer: Self-pay | Admitting: Family Medicine

## 2011-05-27 ENCOUNTER — Encounter: Payer: Self-pay | Admitting: Family Medicine

## 2011-05-27 DIAGNOSIS — F329 Major depressive disorder, single episode, unspecified: Secondary | ICD-10-CM

## 2011-05-27 DIAGNOSIS — F411 Generalized anxiety disorder: Secondary | ICD-10-CM

## 2011-05-27 DIAGNOSIS — F419 Anxiety disorder, unspecified: Secondary | ICD-10-CM

## 2011-05-27 MED ORDER — FLUOXETINE HCL 20 MG PO TABS: 20.0000 mg | ORAL_TABLET | Freq: Every day | ORAL | Status: DC

## 2011-05-27 NOTE — Patient Instructions (Signed)
F/u 1 month 

## 2011-05-27 NOTE — Progress Notes (Signed)
  Patient Name: Chad Wilson Date of Birth: 03-06-1976 Age: 36 y.o. Medical Record Number: [id number] Gender: male Date of Encounter: 05/27/2011  History of Present Illness:  Chad Wilson is a 36 y.o. very pleasant male patient who presents with the following:  Will feel like maybe like when has pain, will make the symptoms worse and will  Get a little worse. Sometimes will feel like cannot take a breath when in [employer].   Has been ongoing for the past couple of years. Has gotten dehydrated a couple of times.  About 6 months ago was drinking about every day  Past Medical History, Surgical History, Social History, Family History, Problem List, Medications, and Allergies have been reviewed and updated if relevant.  Review of Systems: above   Physical Examination: Filed Vitals:   05/27/11 1121  BP: 120/72  Pulse: 97  Temp: 98.6 F (37 C)  TempSrc: Oral  Height: 5' 6"  (1.676 m)  Weight: 165 lb 12.8 oz (75.206 kg)  SpO2: 99%    Body mass index is 26.76 kg/(m^2).   GEN: WDWN, NAD, Non-toxic, Alert & Oriented x 3 HEENT: Atraumatic, Normocephalic.  Ears and Nose: No external deformity. EXTR: No clubbing/cyanosis/edema NEURO: Normal gait.  PSYCH: Normally interactive. Conversant. Not depressed or anxious appearing.  Calm demeanor.    Assessment and Plan: 1. Depression  FLUoxetine (PROZAC) 20 MG tablet  2. Anxiety  FLUoxetine (PROZAC) 20 MG tablet  3. ANXIETY      >15 minutes spent in face to face time with patient, >50% spent in counselling or coordination of care: All in counseling. The patient discontinued alcohol use 6 months ago. He was drinking every day, 7 days a week at that point. Now he is drinking one to 2 drinks weekly. He is having some depressed mood. Some anxiety, and has even had some anxiety attacks and had to leave [employer] another stores. No suicidality or homicidality. Decreased interest and activity levels.

## 2011-06-27 ENCOUNTER — Encounter: Payer: Self-pay | Admitting: Family Medicine

## 2011-06-27 ENCOUNTER — Ambulatory Visit (INDEPENDENT_AMBULATORY_CARE_PROVIDER_SITE_OTHER): Payer: Self-pay | Admitting: Family Medicine

## 2011-06-27 VITALS — BP 128/78 | HR 77 | Temp 98.0°F | Ht 66.0 in | Wt 160.8 lb

## 2011-06-27 DIAGNOSIS — F329 Major depressive disorder, single episode, unspecified: Secondary | ICD-10-CM

## 2011-06-27 DIAGNOSIS — F411 Generalized anxiety disorder: Secondary | ICD-10-CM

## 2011-06-27 DIAGNOSIS — F419 Anxiety disorder, unspecified: Secondary | ICD-10-CM

## 2011-06-27 MED ORDER — HYDROCORTISONE ACETATE 25 MG RE SUPP: 25.0000 mg | Freq: Two times a day (BID) | RECTAL | Status: AC | PRN

## 2011-06-27 MED ORDER — FLUOXETINE HCL 40 MG PO CAPS: 40.0000 mg | ORAL_CAPSULE | Freq: Every day | ORAL | Status: DC

## 2011-06-27 NOTE — Progress Notes (Signed)
  Patient Name: Chad Wilson Date of Birth: Nov 03, 1975 Age: 36 y.o. Medical Record Number: [id number] Gender: male Date of Encounter: 06/27/2011  >15 minutes spent in face to face time with patient, >50% spent in counselling or coordination of care:   Pleasant gentleman, who was started on Prozac 20 mg one month ago, here in followup. His anxiety has improved somewhat, but it is still present. He has a decreased frequency of his anxiety attacks. Decreased sensation of generalized anxiety. He is currently off of alcohol completely. No real significant depression right now. No panic attacks. He has been compliant with his medication. He is here with his wife today.  He is taking his hepatitis C medications, and they have made him intervally nauseous, and having some diarrhea. He also has some irritation in his anus and a hemorrhoid. Anucort

## 2011-07-01 ENCOUNTER — Ambulatory Visit: Payer: Self-pay | Admitting: Family Medicine

## 2011-08-09 ENCOUNTER — Telehealth: Payer: Self-pay | Admitting: Family Medicine

## 2011-08-09 NOTE — Telephone Encounter (Signed)
Triage Record Num: [id number] Operator: Valinda Hoar Patient Name: Chad Wilson Call Date & Time: 08/08/2011 5:26:16PM Patient Phone: PCP: Eliezer Lofts Patient Gender: Male PCP Fax : [phone] Patient DOB: 08/19/1975 Practice Name: [facility] Reason for Call: Theone Stanley reports pt.is currently receiving HEP C injections and had a recent positive drug screen. Caller asking if Hep C injections could cause a Postive drug Screen. Advised Positive drug test is not likely related to injections. . Suggested checking with office to discuss any meds pt. is taking that would result in a Positive test. Caller verb. understanding. Protocol(s) Used: Information Only Call; No Symptom Triage (Adult) Recommended Outcome per Protocol: Call Provider When Office is Open Reason for Outcome: Requesting information and provider is best resource; no triage required. Care Advice: ~ 08/08/2011 5:48:31PM Page 1 of 1 [id number]

## 2011-08-09 NOTE — Telephone Encounter (Signed)
Patient advised.

## 2011-08-09 NOTE — Telephone Encounter (Signed)
I don't believe injections would cause positive drug test, but caller should contact Hep C office who gives regular injections to ask their specialist.   he is not on any other meds that could cause pos drug screen.

## 2011-09-06 ENCOUNTER — Ambulatory Visit (INDEPENDENT_AMBULATORY_CARE_PROVIDER_SITE_OTHER): Payer: Self-pay | Admitting: Family Medicine

## 2011-09-06 ENCOUNTER — Encounter: Payer: Self-pay | Admitting: Family Medicine

## 2011-09-06 ENCOUNTER — Ambulatory Visit (INDEPENDENT_AMBULATORY_CARE_PROVIDER_SITE_OTHER)
Admission: RE | Admit: 2011-09-06 | Discharge: 2011-09-06 | Disposition: A | Discharge status: Home / Self Care | Payer: Self-pay | Source: Ambulatory Visit | Attending: Family Medicine | Admitting: Family Medicine

## 2011-09-06 VITALS — BP 110/60 | HR 74 | Temp 98.7°F | Ht 66.0 in | Wt 158.0 lb

## 2011-09-06 DIAGNOSIS — M542 Cervicalgia: Secondary | ICD-10-CM

## 2011-09-06 DIAGNOSIS — M5412 Radiculopathy, cervical region: Secondary | ICD-10-CM | POA: Insufficient documentation

## 2011-09-06 DIAGNOSIS — G8928 Other chronic postprocedural pain: Secondary | ICD-10-CM | POA: Insufficient documentation

## 2011-09-06 MED ORDER — CYCLOBENZAPRINE HCL 10 MG PO TABS: 10.0000 mg | ORAL_TABLET | Freq: Every day | ORAL | Status: DC

## 2011-09-06 MED ORDER — DICLOFENAC SODIUM 75 MG PO TBEC: 75.0000 mg | DELAYED_RELEASE_TABLET | Freq: Two times a day (BID) | ORAL | Status: DC

## 2011-09-06 NOTE — Assessment & Plan Note (Addendum)
X-ray showed: no fracture, degenerative changes at C5-C6 as expected. Treat with NSAID , nighttime muscle relaxant.proper sleep ergonomics, heat and massage. ... If not better will try cervical collar and or physical therapy.

## 2011-09-06 NOTE — Patient Instructions (Addendum)
Heat and massage on neck. Can use muscle relaxant at night. Sleep with head and neck aligned with body using samll pillow under neck when lying on back or several pillows when lying onside.  We will call with X-ray results. Start antiinflammatory.. Make follow up in 2 weeks for re-evaluation

## 2011-09-06 NOTE — Progress Notes (Signed)
  Subjective:    Patient ID: Chad Wilson, male    DOB: Dec 20, 1975, 36 y.o.   MRN: [id number]  HPI  36 year old male with history of hep C med presents with heaviness in right arm. Pain at shoulder blade on right runs down to right arm. Right first three digits are numb. Ongoing for a year but worse in last few months.  When sleeping on left side arm goes numb, improves in lying flat on back. 6/10 on pain scale.  No weakness.  No recent injury,some  neck pain over right lateral neck. 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  Not taking any med.       Review of Systems  Constitutional: Negative for fever and fatigue.  HENT: Positive for neck pain and neck stiffness. Negative for ear pain.   Eyes: Negative for pain.  Respiratory: Negative for shortness of breath.   Cardiovascular: Negative for leg swelling.  Gastrointestinal: Negative for abdominal pain.       Objective:   Physical Exam  Constitutional: He is oriented to person, place, and time. Vital signs are normal. He appears well-developed and well-nourished.  HENT:  Head: Normocephalic.  Right Ear: Hearing normal.  Left Ear: Hearing normal.  Nose: Nose normal.  Mouth/Throat: Oropharynx is clear and moist and mucous membranes are normal.  Neck: Trachea normal. Carotid bruit is not present. No mass and no thyromegaly present.  Cardiovascular: Normal rate, regular rhythm and normal pulses.  Exam reveals no gallop, no distant heart sounds and no friction rub.   No murmur heard.      No peripheral edema  Pulmonary/Chest: Effort normal and breath sounds normal. No respiratory distress.  Musculoskeletal:       Cervical back: He exhibits decreased range of motion, tenderness and bony tenderness.       Right upper arm: Normal.       Left upper arm: Normal.       Spurling positive on right, neg Tinel/Phalen B, neg ulnar impingement  Neurological: He is alert and oriented to person, place, and time. He has  normal strength. No cranial nerve deficit or sensory deficit.  Skin: Skin is warm, dry and intact. No rash noted.          Assessment & Plan:

## 2011-09-20 ENCOUNTER — Ambulatory Visit: Payer: Self-pay | Admitting: Family Medicine

## 2011-09-27 ENCOUNTER — Ambulatory Visit (INDEPENDENT_AMBULATORY_CARE_PROVIDER_SITE_OTHER): Payer: Self-pay | Admitting: Family Medicine

## 2011-09-27 ENCOUNTER — Encounter: Payer: Self-pay | Admitting: Family Medicine

## 2011-09-27 VITALS — BP 120/78 | HR 84 | Temp 98.9°F | Wt 162.0 lb

## 2011-09-27 DIAGNOSIS — M5412 Radiculopathy, cervical region: Secondary | ICD-10-CM

## 2011-09-27 DIAGNOSIS — M542 Cervicalgia: Secondary | ICD-10-CM

## 2011-09-27 MED ORDER — CYCLOBENZAPRINE HCL 10 MG PO TABS: 10.0000 mg | ORAL_TABLET | Freq: Every day | ORAL | Status: AC

## 2011-09-27 NOTE — Assessment & Plan Note (Signed)
Minimal improvement with NSAIDs and SE to NSAIDs. Will continue muscle relaxant and refer to PT for traction etc. If not improving consider referral to PM&R.

## 2011-09-27 NOTE — Patient Instructions (Addendum)
Use muscle relaxant at night.  Stop at front desk to set up PT referral.  Follow up after PT completed if not improving as expected.

## 2011-09-27 NOTE — Progress Notes (Signed)
  Subjective:    Patient ID: Chad Wilson, male    DOB: May 21, 1975, 36 y.o.   MRN: [id number]  HPI  36 year old male here for 2 week follow up with cervical radiculopathy. Started on voltaren and muscle relaxants on 6/21. proper sleep ergonomics, heat and massage.    09/06/2011  Cervical Spine Films IMPRESSION:  1. 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  2. 2 mm retrolisthesis of C6 on C7 by 2 mm most likely  degenerative.  He reports that he has continued  intermittent 7/10 pain in right neck and right arm, tingling in fingers.  Sometimes he goes all day He used voltaren during the day.. But made him nauseous.  Muscle relaxant helped at night.  No new symptoms. No weakness in right hand.  Review of Systems  Constitutional: Negative for fever and fatigue.  Respiratory: Negative for shortness of breath.   Cardiovascular: Negative for chest pain.  Gastrointestinal: Negative for nausea and abdominal pain.       Objective:   Physical Exam  Constitutional: He is oriented to person, place, and time. Vital signs are normal. He appears well-developed and well-nourished.  HENT:  Head: Normocephalic.  Right Ear: Hearing normal.  Left Ear: Hearing normal.  Nose: Nose normal.  Mouth/Throat: Oropharynx is clear and moist and mucous membranes are normal.  Neck: Trachea normal. Carotid bruit is not present. No mass and no thyromegaly present.  Cardiovascular: Normal rate, regular rhythm and normal pulses.  Exam reveals no gallop, no distant heart sounds and no friction rub.   No murmur heard.      No peripheral edema  Pulmonary/Chest: Effort normal and breath sounds normal. No respiratory distress.  Musculoskeletal:       Cervical back: He exhibits decreased range of motion, tenderness and bony tenderness.       Right upper arm: Normal.       Left upper arm: Normal.       Spurling positive on right, neg Tinel/Phalen B, neg ulnar  impingement  Neurological: He is alert and oriented to person, place, and time. He has normal strength. No cranial nerve deficit or sensory deficit.  Skin: Skin is warm, dry and intact. No rash noted.          Assessment & Plan:

## 2011-09-27 NOTE — Addendum Note (Signed)
Addended by: Eliezer Lofts E on: 09/27/2011 09:16 AM   Modules accepted: Orders

## 2011-10-08 ENCOUNTER — Ambulatory Visit: Payer: Self-pay | Attending: Family Medicine

## 2011-10-08 DIAGNOSIS — M2550 Pain in unspecified joint: Secondary | ICD-10-CM | POA: Insufficient documentation

## 2011-10-08 DIAGNOSIS — IMO0001 Reserved for inherently not codable concepts without codable children: Secondary | ICD-10-CM | POA: Insufficient documentation

## 2011-10-09 ENCOUNTER — Ambulatory Visit: Payer: Self-pay | Admitting: Rehabilitation

## 2011-10-15 ENCOUNTER — Encounter: Payer: Self-pay | Admitting: Rehabilitation

## 2011-10-18 ENCOUNTER — Encounter: Payer: Self-pay | Admitting: Rehabilitation

## 2011-11-19 ENCOUNTER — Other Ambulatory Visit: Payer: Self-pay

## 2011-11-19 MED ORDER — FLUOXETINE HCL 40 MG PO CAPS: 40.0000 mg | ORAL_CAPSULE | Freq: Every day | ORAL | Status: DC

## 2011-11-19 NOTE — Telephone Encounter (Signed)
Patient notified as instructed by telephone refill was done.

## 2011-11-19 NOTE — Telephone Encounter (Signed)
Pt left v/m refill fluoxetine for anxiety to [facility].Please advise.

## 2016-01-18 ENCOUNTER — Encounter: Payer: Self-pay | Admitting: Internal Medicine

## 2016-03-20 ENCOUNTER — Encounter: Payer: Self-pay | Admitting: Endocrinology

## 2016-03-20 ENCOUNTER — Ambulatory Visit (INDEPENDENT_AMBULATORY_CARE_PROVIDER_SITE_OTHER): Payer: Self-pay | Admitting: Endocrinology

## 2016-03-20 DIAGNOSIS — E0590 Thyrotoxicosis, unspecified without thyrotoxic crisis or storm: Secondary | ICD-10-CM

## 2016-03-20 MED ORDER — PROPRANOLOL HCL ER 60 MG PO CP24: 60.0000 mg | ORAL_CAPSULE | Freq: Every day | ORAL | 5 refills | Status: DC

## 2016-03-20 NOTE — Progress Notes (Signed)
Subjective:    Patient ID: Chad Wilson, male    DOB: Apr 01, 1975, 41 y.o.   MRN: [id number]  HPI Pt is referred by Lysle Rubens, PA, for hyperthyroidism.  Pt reports he was dx'ed with hyperthyroidism in early 2017, when he was incarcerated.  He has been on methimazole since dx.  He now takes 20 mg qd (was increased from 15 mg qd, 2 weeks ago).  He has never had XRT to the anterior neck, or thyroid surgery.  He does not consume kelp or any other prescribed or non-prescribed thyroid medication.  He has never been on amiodarone.  He reports slight doe sensation in the chest, and assoc lightheadedness.   Past Medical History:  Diagnosis Date  . Acute hepatitis C without mention of hepatic coma(070.51)   . Alcohol abuse, unspecified   . Anxiety states   . Depression 05/27/2011  . Essential hypertension, benign   . Panic disorder without agoraphobia   . Tobacco use disorder     No past surgical history on file.  Social History   Social History  . Marital status: Single    Spouse name: N/A  . Number of children: 0  . Years of education: N/A   Occupational History  .  [employer]   Social History Main Topics  . Smoking status: Current Every Day Smoker  . Smokeless tobacco: Not on file  . Alcohol use Yes  . Drug use:      Comment: cocaine  . Sexual activity: Not on file   Other Topics Concern  . Not on file   Social History Narrative   Regular exercise-no      Diet: Fruits and veggies, water poweraid, occ sweet tea    Current Outpatient Prescriptions on File Prior to Visit  Medication Sig Dispense Refill  . [DISCONTINUED] omeprazole (PRILOSEC) 20 MG capsule Take 1 capsule (20 mg total) by mouth daily. 30 capsule 3   No current facility-administered medications on file prior to visit.     No Known Allergies  Family History  Problem Relation Age of Onset  . Anxiety disorder Mother   . Scoliosis Mother   . Hyperthyroidism Mother   . Anxiety disorder Sister    . Hyperthyroidism Sister     BP (!) 142/86   Pulse 89   Ht 5' 6"  (1.676 m)   Wt 155 lb (70.3 kg)   SpO2 94%   BMI 25.02 kg/m   Review of Systems denies weight loss, headache, hoarseness, diplopia, fever, diarrhea, polyuria, muscle weakness, excessive diaphoresis, heat intolerance, easy bruising, and rhinorrhea.  He reports nausea, tremor, anxiety, and intermittent palpitations.       Objective:   Physical Exam VS: see vs page GEN: no distress HEAD: head: no deformity eyes: slight bilat periorbital swelling and proptosis.   external nose and ears are normal mouth: no lesion seen NECK: thyroid is 5 times normal size, R>L.  No palpable nodule, but there is a bilat thyroid bruit. CHEST WALL: no deformity LUNGS: clear to auscultation CV: reg rate and rhythm, no murmur ABD: abdomen is soft, nontender.  no hepatosplenomegaly.  not distended.  no hernia MUSCULOSKELETAL: muscle bulk and strength are grossly normal.  no obvious joint swelling.  gait is normal and steady EXTEMITIES: no deformity.  no edema PULSES: no carotid bruit NEURO:  cn 2-12 grossly intact.   readily moves all 4's.  sensation is intact to touch on all 4's.  Slight tremor of the hands SKIN:  Normal texture and temperature.  No rash or suspicious lesion is visible.  Not diaphoretic.   NODES:  None palpable at the neck PSYCH: alert, well-oriented.  Does not appear anxious nor depressed.  I have reviewed outside records, and summarized: Pt was noted to have hyperthyroidism, and referred here.  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  outside test results are reviewed: TSH=0.01 Free T4=5.14     Assessment & Plan:  hyperthyroidism, due to [place], new to me. We discussed rx options.  He chooses RAI.  Patient is advised the following: Patient Instructions  For now, please stop taking the methimazole.   I have sent a prescription to your pharmacy, to  resume the propranolol.   let's check a thyroid "scan" (a special, but easy and painless type of thyroid x ray).  It works like this: you go to the x-ray department of the hospital to swallow a pill, which contains a miniscule amount of radiation.  You will not notice any symptoms from this.  You will go back to the x-ray department the next day, to lie down in front of a camera.  The results of this will be sent to me.   Based on the results, I hope to order for you a treatment pill of radioactive iodine.  Although it is a larger amount of radiation, you will again notice no symptoms from this.  The pill is gone from your body in a few days (during which you should stay away from other people), but takes several months to work.  However, since the thyroid can temporarily go higher, please return here approximately 3-5 days after the treatment.  This treatment has been available for many years, and the only known side-effect is an underactive thyroid.  It is possible that i would eventually prescribe for you a thyroid hormone pill, which is very inexpensive.  You don't have to worry about side-effects of this thyroid hormone pill, because it is the same molecule your thyroid makes.

## 2016-03-20 NOTE — Patient Instructions (Addendum)
For now, please stop taking the methimazole.   I have sent a prescription to your pharmacy, to resume the propranolol.   let's check a thyroid "scan" (a special, but easy and painless type of thyroid x ray).  It works like this: you go to the x-ray department of the hospital to swallow a pill, which contains a miniscule amount of radiation.  You will not notice any symptoms from this.  You will go back to the x-ray department the next day, to lie down in front of a camera.  The results of this will be sent to me.   Based on the results, I hope to order for you a treatment pill of radioactive iodine.  Although it is a larger amount of radiation, you will again notice no symptoms from this.  The pill is gone from your body in a few days (during which you should stay away from other people), but takes several months to work.  However, since the thyroid can temporarily go higher, please return here approximately 3-5 days after the treatment.  This treatment has been available for many years, and the only known side-effect is an underactive thyroid.  It is possible that i would eventually prescribe for you a thyroid hormone pill, which is very inexpensive.  You don't have to worry about side-effects of this thyroid hormone pill, because it is the same molecule your thyroid makes.

## 2016-04-02 ENCOUNTER — Encounter (HOSPITAL_COMMUNITY)
Admission: RE | Admit: 2016-04-02 | Discharge: 2016-04-02 | Disposition: A | Discharge status: Home / Self Care | Payer: Self-pay | Source: Ambulatory Visit | Attending: Endocrinology | Admitting: Endocrinology

## 2016-04-02 DIAGNOSIS — E0590 Thyrotoxicosis, unspecified without thyrotoxic crisis or storm: Secondary | ICD-10-CM | POA: Insufficient documentation

## 2016-04-02 MED ORDER — SODIUM IODIDE I 131 CAPSULE
7.7000 | Freq: Once | INTRAVENOUS | Status: AC | PRN
  Administered 2016-04-02: 7.7 via ORAL

## 2016-04-03 ENCOUNTER — Encounter (HOSPITAL_COMMUNITY): Payer: Self-pay

## 2016-04-03 ENCOUNTER — Encounter (HOSPITAL_COMMUNITY)
Admission: RE | Admit: 2016-04-03 | Discharge: 2016-04-03 | Disposition: A | Discharge status: Home / Self Care | Payer: Self-pay | Source: Ambulatory Visit | Attending: Endocrinology | Admitting: Endocrinology

## 2016-04-03 DIAGNOSIS — E0590 Thyrotoxicosis, unspecified without thyrotoxic crisis or storm: Secondary | ICD-10-CM | POA: Insufficient documentation

## 2016-04-03 MED ORDER — SODIUM PERTECHNETATE TC 99M INJECTION
9.9000 | Freq: Once | INTRAVENOUS | Status: AC | PRN
  Administered 2016-04-03: 9.9 via INTRAVENOUS

## 2016-04-04 ENCOUNTER — Other Ambulatory Visit: Payer: Self-pay | Admitting: Endocrinology

## 2016-04-04 DIAGNOSIS — E0590 Thyrotoxicosis, unspecified without thyrotoxic crisis or storm: Secondary | ICD-10-CM

## 2016-04-09 ENCOUNTER — Telehealth: Payer: Self-pay | Admitting: Endocrinology

## 2016-04-09 NOTE — Telephone Encounter (Signed)
Pt's wife called in and was checking in on the lab results and what the next steps will be.

## 2016-04-10 NOTE — Telephone Encounter (Signed)
I contacted the patient and advised the next step is to complete the RAI treatment. Dr. Loanne Drilling placed the order on 04/04/2016 and patient was advised our patient care coordinators will be calling to schedule this test soon. Patient voiced understanding and had no further questions at this time.

## 2016-04-11 ENCOUNTER — Other Ambulatory Visit: Payer: Self-pay

## 2016-04-12 ENCOUNTER — Other Ambulatory Visit: Payer: Self-pay | Admitting: Endocrinology

## 2016-04-12 DIAGNOSIS — E0590 Thyrotoxicosis, unspecified without thyrotoxic crisis or storm: Secondary | ICD-10-CM

## 2016-04-16 ENCOUNTER — Encounter: Payer: Self-pay | Admitting: Endocrinology

## 2016-04-16 ENCOUNTER — Other Ambulatory Visit (INDEPENDENT_AMBULATORY_CARE_PROVIDER_SITE_OTHER): Payer: Self-pay

## 2016-04-16 DIAGNOSIS — E0590 Thyrotoxicosis, unspecified without thyrotoxic crisis or storm: Secondary | ICD-10-CM

## 2016-04-16 LAB — TSH: TSH: 0.12 u[IU]/mL — ABNORMAL LOW (ref 0.35–4.50)

## 2016-04-16 LAB — T4, FREE: Free T4: 2.53 ng/dL — ABNORMAL HIGH (ref 0.60–1.60)

## 2016-04-17 ENCOUNTER — Other Ambulatory Visit: Payer: Self-pay | Admitting: Endocrinology

## 2016-04-17 MED ORDER — ALPRAZOLAM 0.25 MG PO TABS: 0.2500 mg | ORAL_TABLET | Freq: Two times a day (BID) | ORAL | 0 refills | Status: DC | PRN

## 2016-04-22 ENCOUNTER — Encounter: Payer: Self-pay | Admitting: Endocrinology

## 2016-04-26 ENCOUNTER — Encounter (HOSPITAL_COMMUNITY)
Admission: RE | Admit: 2016-04-26 | Discharge: 2016-04-26 | Disposition: A | Discharge status: Home / Self Care | Payer: Self-pay | Source: Ambulatory Visit | Attending: Endocrinology | Admitting: Endocrinology

## 2016-04-26 DIAGNOSIS — E0590 Thyrotoxicosis, unspecified without thyrotoxic crisis or storm: Secondary | ICD-10-CM

## 2016-04-26 DIAGNOSIS — E0500 Thyrotoxicosis with diffuse goiter without thyrotoxic crisis or storm: Secondary | ICD-10-CM | POA: Insufficient documentation

## 2016-04-26 MED ORDER — SODIUM IODIDE I 131 CAPSULE
15.7000 | Freq: Once | INTRAVENOUS | Status: AC | PRN
  Administered 2016-04-26: 15.7 via ORAL

## 2016-05-01 ENCOUNTER — Telehealth: Payer: Self-pay | Admitting: Endocrinology

## 2016-05-01 ENCOUNTER — Encounter: Payer: Self-pay | Admitting: Endocrinology

## 2016-05-01 NOTE — Telephone Encounter (Signed)
[facility] call [facility] if we can rx anything

## 2016-05-01 NOTE — Telephone Encounter (Signed)
Pt is having issue with ED, can this have to do with thyroid and can we assist him.

## 2016-05-02 NOTE — Telephone Encounter (Signed)
Pt is asking for help with ED please advise

## 2016-05-03 ENCOUNTER — Other Ambulatory Visit: Payer: Self-pay | Admitting: Endocrinology

## 2016-05-03 ENCOUNTER — Telehealth: Payer: Self-pay

## 2016-05-03 ENCOUNTER — Telehealth: Payer: Self-pay | Admitting: Endocrinology

## 2016-05-03 MED ORDER — ALPRAZOLAM 0.25 MG PO TABS: 0.2500 mg | ORAL_TABLET | Freq: Two times a day (BID) | ORAL | 0 refills | Status: DC | PRN

## 2016-05-03 MED ORDER — SILDENAFIL CITRATE 100 MG PO TABS: 100.0000 mg | ORAL_TABLET | Freq: Every day | ORAL | 0 refills | Status: DC | PRN

## 2016-05-03 MED FILL — SILDENAFIL 100 MG TABLET: 100 | 2 days supply | Qty: 1 | Fill #0

## 2016-05-03 MED FILL — ALPRAZolam 0.25 MG TABS: 0.25 | 15 days supply | Qty: 30 | Fill #0

## 2016-05-03 NOTE — Telephone Encounter (Signed)
Ok, I have sent a prescription to your pharmacy

## 2016-05-03 NOTE — Telephone Encounter (Signed)
Dr. Loanne Drilling see messages and please advise, Thanks!

## 2016-05-03 NOTE — Telephone Encounter (Signed)
Pt's wife called in and requested that the new script that Dr. Loanne Drilling was going to call in be sent to [facility].

## 2016-05-03 NOTE — Telephone Encounter (Signed)
Pt is wanting to please have this called in before the day is over, please call [facility]. Thank you

## 2016-05-03 NOTE — Telephone Encounter (Signed)
I contacted the patient and advised rx has been submitted to [facility] out patient.

## 2016-05-03 NOTE — Telephone Encounter (Signed)
Since the dosage is not indicated it will have to be done by Dr. Loanne Drilling

## 2016-05-03 NOTE — Telephone Encounter (Signed)
Patient sent a mychart message requesting a prescription for Viagra. Dr. Loanne Drilling sent a message via mychart that the prescription as been approved but did not advise on the dosage. Could you advise on the dosage during his absence?  Thanks!

## 2016-05-06 NOTE — Telephone Encounter (Signed)
Pt is asking if we can call into [facility] for the sildenafil 20 mg

## 2016-05-07 MED ORDER — SILDENAFIL CITRATE 100 MG PO TABS: 100.0000 mg | ORAL_TABLET | Freq: Every day | ORAL | 0 refills | Status: DC | PRN

## 2016-05-07 NOTE — Telephone Encounter (Signed)
Message viewed on 05/07/2016. Refill submitted.

## 2016-05-07 NOTE — Telephone Encounter (Signed)
Pt's fiance is asking if [place] needs to make an appt now that he has had his imaging done

## 2016-05-13 ENCOUNTER — Ambulatory Visit (INDEPENDENT_AMBULATORY_CARE_PROVIDER_SITE_OTHER): Payer: Self-pay | Admitting: Endocrinology

## 2016-05-13 ENCOUNTER — Encounter: Payer: Self-pay | Admitting: Endocrinology

## 2016-05-13 VITALS — BP 140/84 | HR 84 | Temp 98.3°F | Ht 66.0 in | Wt 151.0 lb

## 2016-05-13 DIAGNOSIS — E0590 Thyrotoxicosis, unspecified without thyrotoxic crisis or storm: Secondary | ICD-10-CM

## 2016-05-13 LAB — TSH: TSH: 0.01 u[IU]/mL — AB (ref 0.35–4.50)

## 2016-05-13 LAB — T4, FREE: Free T4: 1.72 ng/dL — ABNORMAL HIGH (ref 0.60–1.60)

## 2016-05-13 MED ORDER — METHIMAZOLE 10 MG PO TABS: 20.0000 mg | ORAL_TABLET | Freq: Two times a day (BID) | ORAL | 1 refills | Status: DC

## 2016-05-13 NOTE — Progress Notes (Signed)
   Subjective:    Patient ID: Chad Wilson, male    DOB: [birth year], 41 y.o.   MRN: [id number]  HPI Pt returns for f/u of hyperthyroidism (dx'ed 2017, when he was incarcerated; he took tapazole, but this was stopped for RAI, which he had 2 weeks ago).  He has anxiety.   Past Medical History:  Diagnosis Date  . Acute hepatitis C without mention of hepatic coma(070.51)   . Alcohol abuse, unspecified   . Anxiety states   . Depression 05/27/2011  . Essential hypertension, benign   . Panic disorder without agoraphobia   . Tobacco use disorder     No past surgical history on file.  Social History   Social History  . Marital status: Single    Spouse name: N/A  . Number of children: 0  . Years of education: N/A   Occupational History  .  [employer]   Social History Main Topics  . Smoking status: Current Every Day Smoker  . Smokeless tobacco: Never Used  . Alcohol use Yes  . Drug use: Yes     Comment: cocaine  . Sexual activity: Not on file   Other Topics Concern  . Not on file   Social History Narrative   Regular exercise-no      Diet: Fruits and veggies, water poweraid, occ sweet tea    Current Outpatient Prescriptions on File Prior to Visit  Medication Sig Dispense Refill  . ALPRAZolam (XANAX) 0.25 MG tablet Take 1 tablet (0.25 mg total) by mouth 2 (two) times daily as needed for anxiety. 30 tablet 0  . propranolol ER (INDERAL LA) 60 MG 24 hr capsule Take 1 capsule (60 mg total) by mouth daily. 30 capsule 5  . sildenafil (VIAGRA) 100 MG tablet Take 1 tablet (100 mg total) by mouth daily as needed for erectile dysfunction. 10 tablet 0  . [DISCONTINUED] omeprazole (PRILOSEC) 20 MG capsule Take 1 capsule (20 mg total) by mouth daily. 30 capsule 3   No current facility-administered medications on file prior to visit.     No Known Allergies  Family History  Problem Relation Age of Onset  . Anxiety disorder Mother   . Scoliosis Mother   . Hyperthyroidism  Mother   . Anxiety disorder Sister   . Hyperthyroidism Sister     BP 140/84   Pulse 84   Temp 98.3 F (36.8 C) (Oral)   Ht 5' 6"  (1.676 m)   Wt 151 lb (68.5 kg)   SpO2 97%   BMI 24.37 kg/m   Review of Systems Denies fever.     Objective:   Physical Exam VS: see vs page GEN: no distress HEAD: head: no deformity eyes: slight bilat periorbital swelling and proptosis.   external nose and ears are normal mouth: no lesion seen NECK: thyroid is 3 times normal size, R>L.  No palpable nodule.    Lab Results  Component Value Date   TSH 0.01 (L) 05/13/2016      Assessment & Plan:  Hyperthyroidism: not better yet.  I rx'ed tapazole.  Please come back for a follow-up appointment in 1 month.

## 2016-05-13 NOTE — Telephone Encounter (Signed)
Please call in the xanax to [facility] and the methimazole to that pharmacy as well please

## 2016-05-13 NOTE — Patient Instructions (Signed)
blood tests are requested for you today.  We'll let you know about the results. I expect the blood tests will be high again.  If so, I'll prescribe for you a pill, to slow the thyroid while the radioactive iodine is working. Please come back for a follow-up appointment in 1 month.

## 2016-05-14 ENCOUNTER — Telehealth: Payer: Self-pay | Admitting: Endocrinology

## 2016-05-14 MED ORDER — METHIMAZOLE 10 MG PO TABS: ORAL_TABLET | ORAL | 2 refills | Status: DC

## 2016-05-14 MED ORDER — ALPRAZOLAM 0.25 MG PO TABS: 0.2500 mg | ORAL_TABLET | Freq: Two times a day (BID) | ORAL | 0 refills | Status: DC | PRN

## 2016-05-14 NOTE — Telephone Encounter (Signed)
Pt's fiance is asking for the increase to be called in for the pt to be able to pick up the xanax now, he is already completely out. Fiance states that over the weekend he did take more than 2 per day during his anxiety attack. Please advise

## 2016-05-14 NOTE — Telephone Encounter (Signed)
Please dispense on the date requested

## 2016-05-14 NOTE — Telephone Encounter (Signed)
I went ahead and picked up Chad Wilson's meds at [facility] for the thyroid meds.    He still needs the Xanax called and if you could change his pharmacy to [facility] I would greatly appreciate it

## 2016-05-14 NOTE — Telephone Encounter (Signed)
See message and please advise if ok to refill Xanax. Thanks!

## 2016-05-14 NOTE — Telephone Encounter (Signed)
I contacted the pharmacy and spoke with Claiborne Billings. She stated th rx for the xanax was written to be dispensed on 05/16/2016. Patient would like this prescription early and wanted to know if we could override this?  Please advise, Thanks!

## 2016-05-14 NOTE — Telephone Encounter (Signed)
Fiance is aware- cannot dispense early

## 2016-05-14 NOTE — Telephone Encounter (Signed)
Xanax and methimazole was submitted to [facility] on 05/14/2016.

## 2016-05-14 NOTE — Telephone Encounter (Signed)
Xanax and methimazole submitted to [facility]

## 2016-05-14 NOTE — Telephone Encounter (Signed)
See message and please advise, Thanks!  

## 2016-05-14 NOTE — Telephone Encounter (Signed)
I printed

## 2016-05-14 NOTE — Telephone Encounter (Signed)
Please call pharmacy back regarding RX

## 2016-05-17 MED ORDER — SILDENAFIL CITRATE 100 MG PO TABS: 100.0000 mg | ORAL_TABLET | Freq: Every day | ORAL | 0 refills | Status: DC | PRN

## 2016-05-17 NOTE — Telephone Encounter (Signed)
Refill submitted. 

## 2016-05-17 NOTE — Addendum Note (Signed)
Addended by: Verlin Grills T on: 05/17/2016 02:05 PM   Modules accepted: Orders

## 2016-05-17 NOTE — Telephone Encounter (Signed)
Pt is asking for viagra please call into [facility] if approved he is completely out

## 2016-05-24 MED ORDER — SILDENAFIL CITRATE 100 MG PO TABS: 100.0000 mg | ORAL_TABLET | Freq: Every day | ORAL | 0 refills | Status: DC | PRN

## 2016-05-24 MED ORDER — SILDENAFIL CITRATE 20 MG PO TABS: ORAL_TABLET | ORAL | 11 refills | Status: DC

## 2016-05-24 NOTE — Telephone Encounter (Signed)
See message and please advise if we can change the strength of this med.  Thanks!

## 2016-05-24 NOTE — Telephone Encounter (Signed)
Pt is asking for refills to be placed on his viagra please called to [facility]

## 2016-05-24 NOTE — Telephone Encounter (Signed)
[facility] only does 20 mg viagra

## 2016-05-24 NOTE — Telephone Encounter (Signed)
Refill submitted on 05/17/2016. Rx resubmitted.

## 2016-05-24 NOTE — Addendum Note (Signed)
Addended by: Verlin Grills T on: 05/24/2016 10:39 AM   Modules accepted: Orders

## 2016-05-24 NOTE — Addendum Note (Signed)
Addended by: Renato Shin on: 05/24/2016 11:39 AM   Modules accepted: Orders

## 2016-05-24 NOTE — Telephone Encounter (Signed)
I have sent a prescription to your pharmacy

## 2016-05-30 ENCOUNTER — Other Ambulatory Visit: Payer: Self-pay | Admitting: Endocrinology

## 2016-05-30 MED ORDER — ALPRAZOLAM 0.25 MG PO TABS: 0.2500 mg | ORAL_TABLET | Freq: Two times a day (BID) | ORAL | 0 refills | Status: DC | PRN

## 2016-06-06 ENCOUNTER — Telehealth: Payer: Self-pay | Admitting: Endocrinology

## 2016-06-06 NOTE — Telephone Encounter (Signed)
f/u ov is due.  Let's address then

## 2016-06-06 NOTE — Telephone Encounter (Signed)
Need to see what the next step is with Chad Wilson's care. Also can you ask on the [facility] if he can get more than 30 tabs, IF he takes 5 that's only a 4 day supply.

## 2016-06-06 NOTE — Telephone Encounter (Signed)
See message and please advise, Thanks!  

## 2016-06-07 NOTE — Telephone Encounter (Signed)
I contacted the patient and advised of message. He voiced understanding and scheduled his appointment for 06/07/2016 at 345 pm.

## 2016-06-10 ENCOUNTER — Ambulatory Visit: Payer: Self-pay | Admitting: Endocrinology

## 2016-06-17 ENCOUNTER — Other Ambulatory Visit: Payer: Self-pay

## 2016-06-17 ENCOUNTER — Other Ambulatory Visit: Payer: Self-pay | Admitting: Endocrinology

## 2016-06-17 MED ORDER — ALPRAZOLAM 0.25 MG PO TABS: 0.2500 mg | ORAL_TABLET | Freq: Two times a day (BID) | ORAL | 0 refills | Status: DC | PRN

## 2016-06-17 NOTE — Telephone Encounter (Signed)
Pt is in need of a refill of the xanax please. Call in to [facility]

## 2016-06-17 NOTE — Telephone Encounter (Signed)
Rx submitted to [facility] this morning.

## 2016-06-27 ENCOUNTER — Encounter: Payer: Self-pay | Admitting: Endocrinology

## 2016-06-27 ENCOUNTER — Ambulatory Visit (INDEPENDENT_AMBULATORY_CARE_PROVIDER_SITE_OTHER): Payer: Self-pay | Admitting: Endocrinology

## 2016-06-27 VITALS — BP 146/94 | HR 86 | Ht 66.0 in | Wt 166.0 lb

## 2016-06-27 DIAGNOSIS — E0590 Thyrotoxicosis, unspecified without thyrotoxic crisis or storm: Secondary | ICD-10-CM

## 2016-06-27 DIAGNOSIS — F1010 Alcohol abuse, uncomplicated: Secondary | ICD-10-CM

## 2016-06-27 LAB — TSH

## 2016-06-27 LAB — T4, FREE: Free T4: 0.2 ng/dL — ABNORMAL LOW (ref 0.60–1.60)

## 2016-06-27 NOTE — Patient Instructions (Addendum)
blood tests are requested for you today.  We'll let you know about the results.   Please see Corine Shelter, PA, for your blood pressure, as it is high today.  If ever you have fever while taking methimazole, stop it and call us, even if the reason is obvious, because of the risk of a rare side-effect.   Please come back for a follow-up appointment in 1 month.

## 2016-06-27 NOTE — Progress Notes (Signed)
Subjective:    Patient ID: Chad Wilson, male    DOB: 1975-05-14, 41 y.o.   MRN: [id number]  HPI  The state of at least three ongoing medical problems is addressed today, with interval history of each noted here: Pt returns for f/u of hyperthyroidism (dx'ed 2017, when he was incarcerated; he took tapazole, but this was stopped for RAI, which he had 2 weeks ago). He says periorbital swelling persists.  He takes tapazole as rx'ed.  Anxiety: it persists, but is improved.  HTN: he has fatigue, but does not take b-blocker.  Past Medical History:  Diagnosis Date  . Acute hepatitis C without mention of hepatic coma(070.51)   . Alcohol abuse, unspecified   . Anxiety states   . Depression 05/27/2011  . Essential hypertension, benign   . Panic disorder without agoraphobia   . Tobacco use disorder     No past surgical history on file.  Social History   Social History  . Marital status: Single    Spouse name: N/A  . Number of children: 0  . Years of education: N/A   Occupational History  .  [employer]   Social History Main Topics  . Smoking status: Current Every Day Smoker  . Smokeless tobacco: Never Used  . Alcohol use Yes  . Drug use: Yes     Comment: cocaine  . Sexual activity: Not on file   Other Topics Concern  . Not on file   Social History Narrative   Regular exercise-no      Diet: Fruits and veggies, water poweraid, occ sweet tea    Current Outpatient Prescriptions on File Prior to Visit  Medication Sig Dispense Refill  . sildenafil (VIAGRA) 100 MG tablet Take 1 tablet (100 mg total) by mouth daily as needed for erectile dysfunction. 10 tablet 0  . sildenafil (REVATIO) 20 MG tablet 2-5 tabs, as needed for ED symptoms. (Patient not taking: Reported on 06/27/2016) 30 tablet 11  . [DISCONTINUED] omeprazole (PRILOSEC) 20 MG capsule Take 1 capsule (20 mg total) by mouth daily. 30 capsule 3   No current facility-administered medications on file prior to  visit.     No Known Allergies  Family History  Problem Relation Age of Onset  . Anxiety disorder Mother   . Scoliosis Mother   . Hyperthyroidism Mother   . Anxiety disorder Sister   . Hyperthyroidism Sister     BP (!) 146/94   Pulse 86   Ht 5' 6"  (1.676 m)   Wt 166 lb (75.3 kg)   SpO2 95%   BMI 26.79 kg/m    Review of Systems Denies fever.  He has gained a few lbs.      Objective:   Physical Exam VITAL SIGNS:  See vs page GENERAL: no distress HEAD: head: no deformity eyes: slight bilat periorbital swelling and proptosis.   external nose and ears are normal mouth: no lesion seen.  NECK: thyroid is 3 times normal size, R>L.  No palpable nodule.    Lab Results  Component Value Date   TSH 63.84 Repeated and verified X2. (H) 06/27/2016      Assessment & Plan:  Hyperthyroidism: overcontrolled. D/c tapazole Anxiety, persistent: in view of elevated TSH, he needs to d/c xanax HTN: he no longer needs b-blocker, for the thyroid, but he need BP f/u.  Patient Instructions  blood tests are requested for you today.  We'll let you know about the results.   Please see Corine Shelter,  PA, for your blood pressure, as it is high today.  If ever you have fever while taking methimazole, stop it and call us, even if the reason is obvious, because of the risk of a rare side-effect.   Please come back for a follow-up appointment in 1 month.

## 2016-06-28 ENCOUNTER — Telehealth: Payer: Self-pay | Admitting: Endocrinology

## 2016-06-28 LAB — ETHANOL: Ethanol: NEGATIVE %

## 2016-06-28 NOTE — Telephone Encounter (Signed)
Pharmacy called stated patient need a refill of his  Alprazolam.  I did not see it in his medication list.  [facility]- [place], [place] - [address] [phone] (Phone) [phone] (Fax)

## 2016-06-28 NOTE — Telephone Encounter (Signed)
Thyroid has gone low.  This med is no longer needed.

## 2016-06-28 NOTE — Telephone Encounter (Signed)
See message and please advise, Thanks!  

## 2016-06-28 NOTE — Telephone Encounter (Signed)
I contacted Chad Wilson who is on the [facility] and advised of message. She will advise the patient of this message.

## 2016-07-08 ENCOUNTER — Emergency Department (HOSPITAL_COMMUNITY)
Admission: EM | Admit: 2016-07-08 | Discharge: 2016-07-09 | Disposition: A | Discharge status: Home / Self Care | Payer: Self-pay | Attending: Emergency Medicine | Admitting: Emergency Medicine

## 2016-07-08 ENCOUNTER — Emergency Department (HOSPITAL_COMMUNITY): Payer: Self-pay

## 2016-07-08 ENCOUNTER — Encounter: Payer: Self-pay | Admitting: Endocrinology

## 2016-07-08 ENCOUNTER — Encounter (HOSPITAL_COMMUNITY): Payer: Self-pay | Admitting: Emergency Medicine

## 2016-07-08 DIAGNOSIS — Z79899 Other long term (current) drug therapy: Secondary | ICD-10-CM | POA: Insufficient documentation

## 2016-07-08 DIAGNOSIS — R531 Weakness: Secondary | ICD-10-CM | POA: Insufficient documentation

## 2016-07-08 DIAGNOSIS — F17200 Nicotine dependence, unspecified, uncomplicated: Secondary | ICD-10-CM | POA: Insufficient documentation

## 2016-07-08 DIAGNOSIS — I10 Essential (primary) hypertension: Secondary | ICD-10-CM | POA: Insufficient documentation

## 2016-07-08 HISTORY — DX: Disorder of thyroid, unspecified: E07.9

## 2016-07-08 LAB — BASIC METABOLIC PANEL
Anion gap: 11 (ref 5–15)
BUN: 9 mg/dL (ref 6–20)
CALCIUM: 9.4 mg/dL (ref 8.9–10.3)
CHLORIDE: 101 mmol/L (ref 101–111)
CO2: 26 mmol/L (ref 22–32)
CREATININE: 0.99 mg/dL (ref 0.61–1.24)
Glucose, Bld: 108 mg/dL — ABNORMAL HIGH (ref 65–99)
Potassium: 3.3 mmol/L — ABNORMAL LOW (ref 3.5–5.1)
SODIUM: 138 mmol/L (ref 135–145)

## 2016-07-08 LAB — CBC
HCT: 44.2 % (ref 39.0–52.0)
Hemoglobin: 15.4 g/dL (ref 13.0–17.0)
MCH: 31.4 pg (ref 26.0–34.0)
MCHC: 34.8 g/dL (ref 30.0–36.0)
MCV: 90.2 fL (ref 78.0–100.0)
PLATELETS: 233 10*3/uL (ref 150–400)
RBC: 4.9 MIL/uL (ref 4.22–5.81)
RDW: 16.9 % — AB (ref 11.5–15.5)
WBC: 8.4 10*3/uL (ref 4.0–10.5)

## 2016-07-08 LAB — I-STAT TROPONIN, ED: TROPONIN I, POC: 0 ng/mL (ref 0.00–0.08)

## 2016-07-08 NOTE — ED Triage Notes (Signed)
Patient arrives with complaint of 2 weeks of fatigue, hypertension, headache, lightheadedness. States has been struggling with blood pressure over that period of time. SBP readings tonight up to 220 at home. Also currently undergoing treatment for Grave's disease. TSH via wife most recently was approximately 78.

## 2016-07-08 NOTE — ED Provider Notes (Signed)
[facility] DEPT Provider Note   CSN: [id number] Arrival date & time: 07/08/16  2109   By signing my name below, I, Eunice Blase, attest that this documentation has been prepared under the direction and in the presence of Jola Schmidt, MD. Electronically signed, Eunice Blase, ED Scribe. 07/09/16. 12:15 AM.  History   Chief Complaint Chief Complaint  Patient presents with  . Chest Pain  . Hypertension  . Dizziness   The history is provided by the patient, medical records and a significant other. No language interpreter was used.    Chad Wilson is a 41 y.o. male with h/o graves disease, transported to the Emergency Department with concern for ongoing fatigue x 2 weeks. Associated weakness, tingling of the fingertips, diaphoresis, anxiety, chest tightness, high blood pressure readings measured at home (systole: 188-220), cough, N/V/D onset this evening and recent weight gain reported by significant other. Some NL bowel movements noted recently with some occasional constipation. Significant other states she has recently been keeping track of blood pressure readings and they are normally ~164/94. H/o graves disease noted. Pt is Followed by Dr. Loanne Drilling for this. Pt reportedly treated with methazone and radiation, but has had no treatments x 2 weeks as he awaits blood work and F/U both on 07/12/2016,  outpatient and with PCP, respectively. Significant other states Dr. Loanne Drilling will contact the pt for blood work results. Occasional alcohol use noted. No abdominal swelling, abdominal pain, joint pain, other URI symptoms or any other complaints noted at this time.   Past Medical History:  Diagnosis Date  . Acute hepatitis C without mention of hepatic coma(070.51)   . Alcohol abuse, unspecified   . Anxiety states   . Depression 05/27/2011  . Essential hypertension, benign   . Panic disorder without agoraphobia   . Thyroid disease    Graves Disease  . Tobacco use disorder     Patient Active  Problem List   Diagnosis Date Noted  . Hyperthyroidism 03/20/2016  . Neck pain 09/06/2011  . Cervical radiculopathy at C6 09/06/2011  . Depression 05/27/2011  . JAUNDICE 05/21/2010  . HEPATITIS C 11/21/2009  . ANXIETY 11/21/2009  . PANIC ATTACK 11/21/2009  . Alcohol abuse 11/21/2009  . TOBACCO ABUSE 11/21/2009  . HYPERTENSION, BENIGN ESSENTIAL 11/21/2009  . FATIGUE 11/21/2009    History reviewed. No pertinent surgical history.     Home Medications    Prior to Admission medications   Medication Sig Start Date End Date Taking? Authorizing Provider  sildenafil (REVATIO) 20 MG tablet 2-5 tabs, as needed for ED symptoms. Patient not taking: Reported on 06/27/2016 05/24/16   Renato Shin, MD  sildenafil (VIAGRA) 100 MG tablet Take 1 tablet (100 mg total) by mouth daily as needed for erectile dysfunction. 05/24/16   Renato Shin, MD    Family History Family History  Problem Relation Age of Onset  . Anxiety disorder Mother   . Scoliosis Mother   . Hyperthyroidism Mother   . Anxiety disorder Sister   . Hyperthyroidism Sister     Social History Social History  Substance Use Topics  . Smoking status: Current Every Day Smoker  . Smokeless tobacco: Never Used  . Alcohol use Yes     Allergies   Patient has no known allergies.   Review of Systems Review of Systems  Constitutional: Positive for diaphoresis and fatigue.  Respiratory: Positive for cough and chest tightness.   Gastrointestinal: Positive for diarrhea, nausea and vomiting. Negative for abdominal distention and abdominal pain.  Musculoskeletal:  Negative for joint swelling.  Neurological: Positive for weakness and numbness (tingling in fingertips).  Psychiatric/Behavioral: The patient is nervous/anxious.      Physical Exam Updated Vital Signs BP (!) 160/99 (BP Location: Right Arm)   Pulse 98   Temp 98 F (36.7 C) (Oral)   Resp 20   SpO2 99%   Physical Exam  Constitutional: He is oriented to person,  place, and time. He appears well-developed and well-nourished.  HENT:  Head: Normocephalic and atraumatic.  Eyes: EOM are normal.  Neck: Normal range of motion.  Cardiovascular: Normal rate, regular rhythm, normal heart sounds and intact distal pulses.   Pulmonary/Chest: Effort normal and breath sounds normal. No respiratory distress.  Abdominal: Soft. He exhibits no distension. There is no tenderness.  Musculoskeletal: Normal range of motion.  Neurological: He is alert and oriented to person, place, and time.  Skin: Skin is warm and dry.  Psychiatric: He has a normal mood and affect. Judgment normal.  Nursing note and vitals reviewed.    ED Treatments / Results  DIAGNOSTIC STUDIES: Oxygen Saturation is 99% on RA, NL by my interpretation.    COORDINATION OF CARE: 12:07 AM-Discussed next steps with pt. Pt verbalized understanding and is agreeable with the plan. Will order blood work. Pt advised of symptomatic care at home and return precautions.    Labs (all labs ordered are listed, but only abnormal results are displayed) Labs Reviewed  BASIC METABOLIC PANEL - Abnormal; Notable for the following:       Result Value   Potassium 3.3 (*)    Glucose, Bld 108 (*)    All other components within normal limits  CBC - Abnormal; Notable for the following:    RDW 16.9 (*)    All other components within normal limits  TSH  T4, FREE  ETHANOL  I-STAT TROPOININ, ED    EKG  EKG Interpretation  Date/Time:  Monday July 08 2016 21:34:43 EDT Ventricular Rate:  87 PR Interval:  144 QRS Duration: 90 QT Interval:  362 QTC Calculation: 435 R Axis:   56 Text Interpretation:  Normal sinus rhythm Nonspecific T wave abnormality Abnormal ECG No significant change was found Confirmed by Terence Bart  MD, Lennette Bihari ([id number]) on 07/08/2016 11:39:41 PM       Radiology Dg Chest 2 View  Result Date: 07/08/2016 CLINICAL DATA:  41 y/o M; 2 weeks of fatigue, hypertension, headache, lightheadedness. Chest  pain. EXAM: CHEST  2 VIEW COMPARISON:  None. FINDINGS: Normal cardiac silhouette. Clear lungs. No pleural effusion or pneumothorax. Elevated left hemidiaphragm. No acute osseous abnormality is evident. IMPRESSION: No active cardiopulmonary disease.  Elevated left hemidiaphragm. Electronically Signed   By: Kristine Garbe M.D.   On: 07/08/2016 22:53    Procedures Procedures (including critical care time)  Medications Ordered in ED Medications - No data to display   Initial Impression / Assessment and Plan / ED Course  I have reviewed the triage vital signs and the nursing notes.  Pertinent labs & imaging results that were available during my care of the patient were reviewed by me and considered in my medical decision making (see chart for details).    Generalized weakness and fatigue.  No bradycardia.  He was last noted to have low thyroid levels per the patient.  He is scheduled to follow-up with endocrinology this Friday for additional blood testing.  Thyroid studies ordered.  A vas that he call his endocrinology office in the morning for additional recommendations.  I don't believe  this a presentation of stroke or heart attack.  His overall well-appearing.  He has no focal weakness on examination.  His blood work is otherwise unremarkable.  Blood pressures elevated but not drastically here in the ER.  Primary care follow-up.  Ambulatory.  Discharge home in good condition.    Final Clinical Impressions(s) / ED Diagnoses   Final diagnoses:  Weakness  Hypertension, unspecified type    New Prescriptions New Prescriptions   No medications on file  I personally performed the services described in this documentation, which was scribed in my presence. The recorded information has been reviewed and is accurate.        Jola Schmidt, MD 07/09/16 [phone]

## 2016-07-09 LAB — HEPATIC FUNCTION PANEL
ALT: 32 U/L (ref 17–63)
AST: 31 U/L (ref 15–41)
Albumin: 4.3 g/dL (ref 3.5–5.0)
Alkaline Phosphatase: 92 U/L (ref 38–126)
BILIRUBIN INDIRECT: 0.6 mg/dL (ref 0.3–0.9)
Bilirubin, Direct: 0.1 mg/dL (ref 0.1–0.5)
TOTAL PROTEIN: 7.3 g/dL (ref 6.5–8.1)
Total Bilirubin: 0.7 mg/dL (ref 0.3–1.2)

## 2016-07-09 LAB — ETHANOL

## 2016-07-09 LAB — T4, FREE: FREE T4: 0.5 ng/dL — AB (ref 0.61–1.12)

## 2016-07-09 LAB — TSH: TSH: 22.246 u[IU]/mL — AB (ref 0.350–4.500)

## 2016-07-09 NOTE — Telephone Encounter (Signed)
Good Afternoon. Chad Wilson ended up in the ER last night with a BP of 221/184. After multiple test everything was ruled to the Tyroid. The ER doctor did take labs that Chad Wilson is scheduled for on Friday to try and get ahead of the game with Dr Loanne Drilling. Can you have Dr Loanne Drilling take a look at those labs and give [place] a call? Also cancel the lab appointment since they were done last night. I thank you so much for all your help!     Copied Skype message from the pt's fiancee'

## 2016-07-09 NOTE — Discharge Instructions (Signed)
Please call Dr Cordelia Pen office in the morning for additional recommendations

## 2016-07-11 NOTE — Telephone Encounter (Signed)
Pt is calling to check on the status of this message

## 2016-07-11 NOTE — Telephone Encounter (Signed)
See message and please advise, Thanks!  

## 2016-07-12 ENCOUNTER — Ambulatory Visit: Payer: Self-pay | Admitting: Adult Health

## 2016-07-12 ENCOUNTER — Other Ambulatory Visit: Payer: Self-pay

## 2016-07-15 NOTE — Telephone Encounter (Signed)
Dr. Johnette Abraham please assist with the message below and advise

## 2016-07-16 ENCOUNTER — Encounter: Payer: Self-pay | Admitting: Endocrinology

## 2016-07-19 ENCOUNTER — Ambulatory Visit: Payer: Self-pay | Admitting: Adult Health

## 2016-07-29 ENCOUNTER — Ambulatory Visit (INDEPENDENT_AMBULATORY_CARE_PROVIDER_SITE_OTHER): Payer: Self-pay | Admitting: Endocrinology

## 2016-07-29 ENCOUNTER — Encounter: Payer: Self-pay | Admitting: Endocrinology

## 2016-07-29 VITALS — BP 132/86 | HR 101 | Ht 66.0 in | Wt 166.0 lb

## 2016-07-29 DIAGNOSIS — E0590 Thyrotoxicosis, unspecified without thyrotoxic crisis or storm: Secondary | ICD-10-CM

## 2016-07-29 LAB — TSH: TSH: 0.1 u[IU]/mL — AB (ref 0.35–4.50)

## 2016-07-29 LAB — T4, FREE: FREE T4: 0.77 ng/dL (ref 0.60–1.60)

## 2016-07-29 NOTE — Progress Notes (Signed)
   Subjective:    Patient ID: Chad Wilson, male    DOB: 30-Aug-1975, 41 y.o.   MRN: [id number]  HPI The state of at least three ongoing medical problems is addressed today, with interval history of each noted here: Pt returns for f/u of hyperthyroidism (due for Grave's Dz; dx'ed 2017, when he was incarcerated; he took tapazole, but this was stopped for RAI, which he had in Feb of 2018). He reports fatigue and anxiety.   Past Medical History:  Diagnosis Date  . Acute hepatitis C without mention of hepatic coma(070.51)   . Alcohol abuse, unspecified   . Anxiety states   . Depression 05/27/2011  . Essential hypertension, benign   . Panic disorder without agoraphobia   . Thyroid disease    Graves Disease  . Tobacco use disorder     No past surgical history on file.  Social History   Social History  . Marital status: Single    Spouse name: N/A  . Number of children: 0  . Years of education: N/A   Occupational History  .  [employer]   Social History Main Topics  . Smoking status: Current Every Day Smoker  . Smokeless tobacco: Never Used  . Alcohol use Yes  . Drug use: Yes     Comment: cocaine  . Sexual activity: Not on file   Other Topics Concern  . Not on file   Social History Narrative   Regular exercise-no      Diet: Fruits and veggies, water poweraid, occ sweet tea    Current Outpatient Prescriptions on File Prior to Visit  Medication Sig Dispense Refill  . sildenafil (VIAGRA) 100 MG tablet Take 1 tablet (100 mg total) by mouth daily as needed for erectile dysfunction. 10 tablet 0  . doxycycline (VIBRA-TABS) 100 MG tablet Take 100 mg by mouth 2 (two) times daily.    . [DISCONTINUED] omeprazole (PRILOSEC) 20 MG capsule Take 1 capsule (20 mg total) by mouth daily. 30 capsule 3   No current facility-administered medications on file prior to visit.     No Known Allergies  Family History  Problem Relation Age of Onset  . Anxiety disorder Mother   .  Scoliosis Mother   . Hyperthyroidism Mother   . Anxiety disorder Sister   . Hyperthyroidism Sister     BP 132/86   Pulse (!) 101   Ht 5' 6"  (1.676 m)   Wt 166 lb (75.3 kg)   SpO2 95%   BMI 26.79 kg/m    Review of Systems No weight change.    Objective:   Physical Exam VITAL SIGNS:  See vs page GENERAL: no distress eyes: slight bilat periorbital swelling and proptosis.   NECK: thyroid is 3 times normal size, R>L.  There is an irreg surface but no palpable nodule.   Lab Results  Component Value Date   TSH 0.10 (L) 07/29/2016      Assessment & Plan:  Hyperthyroidism, mild but persistent, after RAI.  No rx needed now. Please come back for a follow-up appointment in 2 months.

## 2016-07-29 NOTE — Patient Instructions (Addendum)
blood tests are requested for you today.  We'll let you know about the results.  Please come back for a follow-up appointment in 2 months.

## 2016-08-14 ENCOUNTER — Ambulatory Visit: Payer: Self-pay | Admitting: Adult Health

## 2016-08-14 DIAGNOSIS — Z0289 Encounter for other administrative examinations: Secondary | ICD-10-CM

## 2016-08-15 ENCOUNTER — Other Ambulatory Visit: Payer: Self-pay

## 2016-08-15 MED ORDER — SILDENAFIL CITRATE 100 MG PO TABS: 100.0000 mg | ORAL_TABLET | Freq: Every day | ORAL | 0 refills | Status: DC | PRN

## 2016-09-23 ENCOUNTER — Other Ambulatory Visit: Payer: Self-pay

## 2016-09-23 MED ORDER — SILDENAFIL CITRATE 100 MG PO TABS: 100.0000 mg | ORAL_TABLET | Freq: Every day | ORAL | 2 refills | Status: DC | PRN

## 2016-09-27 ENCOUNTER — Ambulatory Visit: Payer: Self-pay | Admitting: Endocrinology

## 2016-09-27 ENCOUNTER — Ambulatory Visit (INDEPENDENT_AMBULATORY_CARE_PROVIDER_SITE_OTHER): Payer: Self-pay | Admitting: Adult Health

## 2016-09-27 ENCOUNTER — Encounter: Payer: Self-pay | Admitting: Adult Health

## 2016-09-27 VITALS — BP 130/80 | HR 100 | Temp 97.7°F | Ht 67.75 in | Wt 166.6 lb

## 2016-09-27 DIAGNOSIS — F329 Major depressive disorder, single episode, unspecified: Secondary | ICD-10-CM

## 2016-09-27 DIAGNOSIS — Z7689 Persons encountering health services in other specified circumstances: Secondary | ICD-10-CM

## 2016-09-27 DIAGNOSIS — I10 Essential (primary) hypertension: Secondary | ICD-10-CM

## 2016-09-27 DIAGNOSIS — Z0289 Encounter for other administrative examinations: Secondary | ICD-10-CM

## 2016-09-27 DIAGNOSIS — F17200 Nicotine dependence, unspecified, uncomplicated: Secondary | ICD-10-CM

## 2016-09-27 DIAGNOSIS — F419 Anxiety disorder, unspecified: Secondary | ICD-10-CM

## 2016-09-27 MED ORDER — CITALOPRAM HYDROBROMIDE 20 MG PO TABS: 20.0000 mg | ORAL_TABLET | Freq: Every day | ORAL | 3 refills | Status: DC

## 2016-09-27 NOTE — Patient Instructions (Signed)
It was great meeting you today   I have prescribed Celexa to help with anxiety and depression - take this daily   Work on cutting back on smoking   Follow up in one month for your physical   Health Maintenance, Male A healthy lifestyle and preventative care can promote health and wellness.  Maintain regular health, dental, and eye exams.  Eat a healthy diet. Foods like vegetables, fruits, whole grains, low-fat dairy products, and lean protein foods contain the nutrients you need and are low in calories. Decrease your intake of foods high in solid fats, added sugars, and salt. Get information about a proper diet from your health care provider, if necessary.  Regular physical exercise is one of the most important things you can do for your health. Most adults should get at least 150 minutes of moderate-intensity exercise (any activity that increases your heart rate and causes you to sweat) each week. In addition, most adults need muscle-strengthening exercises on 2 or more days a week.   Maintain a healthy weight. The body mass index (BMI) is a screening tool to identify possible weight problems. It provides an estimate of body fat based on height and weight. Your health care provider can find your BMI and can help you achieve or maintain a healthy weight. For males 20 years and older:  A BMI below 18.5 is considered underweight.  A BMI of 18.5 to 24.9 is normal.  A BMI of 25 to 29.9 is considered overweight.  A BMI of 30 and above is considered obese.  Maintain normal blood lipids and cholesterol by exercising and minimizing your intake of saturated fat. Eat a balanced diet with plenty of fruits and vegetables. Blood tests for lipids and cholesterol should begin at age 81 and be repeated every 5 years. If your lipid or cholesterol levels are high, you are over age 99, or you are at high risk for heart disease, you may need your cholesterol levels checked more frequently.Ongoing high lipid  and cholesterol levels should be treated with medicines if diet and exercise are not working.  If you smoke, find out from your health care provider how to quit. If you do not use tobacco, do not start.  Lung cancer screening is recommended for adults aged 30-80 years who are at high risk for developing lung cancer because of a history of smoking. A yearly low-dose CT scan of the lungs is recommended for people who have at least a 30-pack-year history of smoking and are current smokers or have quit within the past 15 years. A pack year of smoking is smoking an average of 1 pack of cigarettes a day for 1 year (for example, a 30-pack-year history of smoking could mean smoking 1 pack a day for 30 years or 2 packs a day for 15 years). Yearly screening should continue until the smoker has stopped smoking for at least 15 years. Yearly screening should be stopped for people who develop a health problem that would prevent them from having lung cancer treatment.  If you choose to drink alcohol, do not have more than 2 drinks per day. One drink is considered to be 12 oz (360 mL) of beer, 5 oz (150 mL) of wine, or 1.5 oz (45 mL) of liquor.  Avoid the use of street drugs. Do not share needles with anyone. Ask for help if you need support or instructions about stopping the use of drugs.  High blood pressure causes heart disease and increases the  risk of stroke. High blood pressure is more likely to develop in:  People who have blood pressure in the end of the normal range (100-139/85-89 mm Hg).  People who are overweight or obese.  People who are African American.  If you are 31-28 years of age, have your blood pressure checked every 3-5 years. If you are 60 years of age or older, have your blood pressure checked every year. You should have your blood pressure measured twice--once when you are at a hospital or clinic, and once when you are not at a hospital or clinic. Record the average of the two measurements.  To check your blood pressure when you are not at a hospital or clinic, you can use:  An automated blood pressure machine at a pharmacy.  A home blood pressure monitor.  If you are 54-92 years old, ask your health care provider if you should take aspirin to prevent heart disease.  Diabetes screening involves taking a blood sample to check your fasting blood sugar level. This should be done once every 3 years after age 34 if you are at a normal weight and without risk factors for diabetes. Testing should be considered at a younger age or be carried out more frequently if you are overweight and have at least 1 risk factor for diabetes.  Colorectal cancer can be detected and often prevented. Most routine colorectal cancer screening begins at the age of 34 and continues through age 67. However, your health care provider may recommend screening at an earlier age if you have risk factors for colon cancer. On a yearly basis, your health care provider may provide home test kits to check for hidden blood in the stool. A small camera at the end of a tube may be used to directly examine the colon (sigmoidoscopy or colonoscopy) to detect the earliest forms of colorectal cancer. Talk to your health care provider about this at age 66 when routine screening begins. A direct exam of the colon should be repeated every 5-10 years through age 22, unless early forms of precancerous polyps or small growths are found.  People who are at an increased risk for hepatitis B should be screened for this virus. You are considered at high risk for hepatitis B if:  You were born in a country where hepatitis B occurs often. Talk with your health care provider about which countries are considered high risk.  Your parents were born in a high-risk country and you have not received a shot to protect against hepatitis B (hepatitis B vaccine).  You have HIV or AIDS.  You use needles to inject street drugs.  You live with, or have  sex with, someone who has hepatitis B.  You are a man who has sex with other men (MSM).  You get hemodialysis treatment.  You take certain medicines for conditions like cancer, organ transplantation, and autoimmune conditions.  Hepatitis C blood testing is recommended for all people born from [birth year] through 1966 and any individual with known risk factors for hepatitis C.  Healthy men should no longer receive prostate-specific antigen (PSA) blood tests as part of routine cancer screening. Talk to your health care provider about prostate cancer screening.  Testicular cancer screening is not recommended for adolescents or adult males who have no symptoms. Screening includes self-exam, a health care provider exam, and other screening tests. Consult with your health care provider about any symptoms you have or any concerns you have about testicular cancer.  Practice safe  sex. Use condoms and avoid high-risk sexual practices to reduce the spread of sexually transmitted infections (STIs).  You should be screened for STIs, including gonorrhea and chlamydia if:  You are sexually active and are younger than 24 years.  You are older than 24 years, and your health care provider tells you that you are at risk for this type of infection.  Your sexual activity has changed since you were last screened, and you are at an increased risk for chlamydia or gonorrhea. Ask your health care provider if you are at risk.  If you are at risk of being infected with HIV, it is recommended that you take a prescription medicine daily to prevent HIV infection. This is called pre-exposure prophylaxis (PrEP). You are considered at risk if:  You are a man who has sex with other men (MSM).  You are a heterosexual man who is sexually active with multiple partners.  You take drugs by injection.  You are sexually active with a partner who has HIV.  Talk with your health care provider about whether you are at high risk of  being infected with HIV. If you choose to begin PrEP, you should first be tested for HIV. You should then be tested every 3 months for as long as you are taking PrEP.  Use sunscreen. Apply sunscreen liberally and repeatedly throughout the day. You should seek shade when your shadow is shorter than you. Protect yourself by wearing long sleeves, pants, a wide-brimmed hat, and sunglasses year round whenever you are outdoors.  Tell your health care provider of new moles or changes in moles, especially if there is a change in shape or color. Also, tell your health care provider if a mole is larger than the size of a pencil eraser.  A one-time screening for abdominal aortic aneurysm (AAA) and surgical repair of large AAAs by ultrasound is recommended for men aged 56-75 years who are current or former smokers.  Stay current with your vaccines (immunizations).   This information is not intended to replace advice given to you by your health care provider. Make sure you discuss any questions you have with your health care provider.   Document Released: 08/31/2007 Document Revised: 03/25/2014 Document Reviewed: 07/30/2010 Elsevier Interactive Patient Education [employer].

## 2016-09-27 NOTE — Progress Notes (Signed)
Patient presents to clinic today to establish care. He is a pleasant 41 year old male who  has a past medical history of Acute hepatitis C without mention of hepatic coma(070.51); Alcohol abuse, unspecified; Anxiety states; Depression (05/27/2011); Essential hypertension, benign; Graves disease; Panic disorder without agoraphobia; Thyroid disease; and Tobacco use disorder.   Acute Concerns: Establish Care    Chronic Issues: Graves Disease - is followed by Endocrinology. Not currently taking any medication   Anxiety and Depression - Has been on Xanax in the past but was taken off by Dr. Loanne Drilling - per patient. He reports having anxiety attacks almost every other day. He constantly feels tired and does not want to do activities he once did. Denies SI or HI   Tobacco Use - He smokes about 1 pack per day. He would like to quit smoking.   Health Maintenance: Dental --Dentures Vision -- Does not do routine care Immunizations --UTD  Colonoscopy -- Never had  Diet: Tries to eat healthy.  Exercise: He is active at work but does not work out  Is followed by   Endocrinology - Dr. Loanne Drilling     Past Medical History:  Diagnosis Date  . Acute hepatitis C without mention of hepatic coma(070.51)   . Alcohol abuse, unspecified   . Anxiety states   . Depression 05/27/2011  . Essential hypertension, benign   . Graves disease    treated by Dr Loanne Drilling  . Panic disorder without agoraphobia   . Thyroid disease    Graves Disease  . Tobacco use disorder     No past surgical history on file.  Current Outpatient Prescriptions on File Prior to Visit  Medication Sig Dispense Refill  . sildenafil (VIAGRA) 100 MG tablet Take 1 tablet (100 mg total) by mouth daily as needed for erectile dysfunction. 10 tablet 2  . [DISCONTINUED] omeprazole (PRILOSEC) 20 MG capsule Take 1 capsule (20 mg total) by mouth daily. 30 capsule 3   No current facility-administered medications on file prior to visit.      No Known Allergies  Family History  Problem Relation Age of Onset  . Anxiety disorder Father   . Heart disease Father   . Hypertension Father   . Thyroid disease Mother   . Anxiety disorder Sister   . Hyperthyroidism Sister     Social History   Social History  . Marital status: Single    Spouse name: N/A  . Number of children: 0  . Years of education: N/A   Occupational History  .  [employer]   Social History Main Topics  . Smoking status: Current Every Day Smoker  . Smokeless tobacco: Never Used  . Alcohol use Yes  . Drug use: Yes     Comment: cocaine  . Sexual activity: Not on file   Other Topics Concern  . Not on file   Social History Narrative   Regular exercise-no      Diet: Fruits and veggies, water poweraid, occ sweet tea    Review of Systems  Constitutional: Positive for malaise/fatigue.  HENT: Negative.   Eyes: Negative.   Respiratory: Negative.   Cardiovascular: Negative.   Gastrointestinal: Negative.   Genitourinary: Negative.   Musculoskeletal: Negative.   Skin: Negative.   Neurological: Negative.   Endo/Heme/Allergies: Negative.   Psychiatric/Behavioral: Positive for depression and substance abuse (hx of ). Negative for memory loss and suicidal ideas. The patient is nervous/anxious and has insomnia.   All other systems reviewed and  are negative.   BP 130/80   Pulse 100   Temp 97.7 F (36.5 C) (Oral)   Ht 5' 7.75" (1.721 m)   Wt 166 lb 9.6 oz (75.6 kg)   BMI 25.52 kg/m   Physical Exam  Constitutional: He is oriented to person, place, and time and well-developed, well-nourished, and in no distress. No distress.  Neck: Thyromegaly present.  Cardiovascular: Normal rate, regular rhythm, normal heart sounds and intact distal pulses.  Exam reveals no gallop and no friction rub.   No murmur heard. Pulmonary/Chest: Effort normal and breath sounds normal. No respiratory distress. He has no wheezes. He has no rales. He  exhibits no tenderness.  Abdominal: Soft. Bowel sounds are normal. He exhibits no distension and no mass. There is no tenderness. There is no rebound and no guarding.  Musculoskeletal: Normal range of motion. He exhibits no edema, tenderness or deformity.  Neurological: He is alert and oriented to person, place, and time. Gait normal. GCS score is 15.  Skin: Skin is warm and dry. No rash noted. He is not diaphoretic. No erythema. No pallor.  Psychiatric: Mood, memory, affect and judgment normal.  Nursing note and vitals reviewed.   Assessment/Plan: 1. Encounter to establish care - Follow up in one month for CPE - Follow up sooner if needed - Needs to exercise outside of work  - Work on quitting smoking   2. HYPERTENSION, BENIGN ESSENTIAL - Near goal  - Will continue to monitor   3. Anxiety and depression - Will start on Celexa 20 mg - citalopram (CELEXA) 20 MG tablet; Take 1 tablet (20 mg total) by mouth daily.  Dispense: 30 tablet; Refill: 3 - Follow up in one month at physical  4. TOBACCO ABUSE - Work on cutting back  - Will talk about Chantix at next visit   Dorothyann Peng, NP

## 2016-10-01 ENCOUNTER — Encounter: Payer: Self-pay | Admitting: Endocrinology

## 2016-10-01 ENCOUNTER — Ambulatory Visit (INDEPENDENT_AMBULATORY_CARE_PROVIDER_SITE_OTHER): Payer: Self-pay | Admitting: Endocrinology

## 2016-10-01 VITALS — BP 160/96 | HR 110 | Wt 161.2 lb

## 2016-10-01 DIAGNOSIS — E0590 Thyrotoxicosis, unspecified without thyrotoxic crisis or storm: Secondary | ICD-10-CM

## 2016-10-01 LAB — T4, FREE: Free T4: 1.51 ng/dL (ref 0.60–1.60)

## 2016-10-01 LAB — TSH: TSH: 0.01 u[IU]/mL — AB (ref 0.35–4.50)

## 2016-10-01 NOTE — Progress Notes (Signed)
   Subjective:    Patient ID: Chad Wilson, male    DOB: 06-20-1975, 41 y.o.   MRN: [id number]  HPI Pt returns for f/u of hyperthyroidism (due for Grave's Dz; dx'ed 2017, when he was incarcerated; he took tapazole, but this was stopped for RAI, which he had in Feb of 2018). He reports sxs of nausea, anxiety, and heat intolerance.   Past Medical History:  Diagnosis Date  . Acute hepatitis C without mention of hepatic coma(070.51)    has been cleared   . Alcohol abuse, unspecified   . Anxiety states   . Depression 05/27/2011  . Essential hypertension, benign   . Graves disease    treated by Dr Loanne Drilling  . Panic disorder without agoraphobia   . Thyroid disease    Graves Disease  . Tobacco use disorder     No past surgical history on file.  Social History   Social History  . Marital status: Single    Spouse name: N/A  . Number of children: 0  . Years of education: N/A   Occupational History  .  [employer]   Social History Main Topics  . Smoking status: Current Every Day Smoker    Packs/day: 1.00    Years: 30.00  . Smokeless tobacco: Never Used  . Alcohol use Yes     Comment: socially   . Drug use: No     Comment: cocaine  . Sexual activity: Not on file   Other Topics Concern  . Not on file   Social History Narrative   He is working in [employer]    Diet: Fruits and veggies, water poweraid, occ sweet tea    Current Outpatient Prescriptions on File Prior to Visit  Medication Sig Dispense Refill  . citalopram (CELEXA) 20 MG tablet Take 1 tablet (20 mg total) by mouth daily. 30 tablet 3  . sildenafil (VIAGRA) 100 MG tablet Take 1 tablet (100 mg total) by mouth daily as needed for erectile dysfunction. 10 tablet 2  . [DISCONTINUED] omeprazole (PRILOSEC) 20 MG capsule Take 1 capsule (20 mg total) by mouth daily. 30 capsule 3   No current facility-administered medications on file prior to visit.     No Known Allergies  Family History  Problem Relation  Age of Onset  . Anxiety disorder Father   . Heart disease Father   . Hypertension Father   . Thyroid disease Mother   . Anxiety disorder Sister   . Hyperthyroidism Sister     BP (!) 160/96 (BP Location: Left Arm, Cuff Size: Normal)   Pulse (!) 110   Wt 161 lb 3.2 oz (73.1 kg)   SpO2 97%   BMI 24.69 kg/m    Review of Systems He has fatigue.      Objective:   Physical Exam VITAL SIGNS:  See vs page GENERAL: no distress eyes: slight bilat periorbital swelling and proptosis.   NECK: thyroid is approx twice normal size, R>L.  There is an irreg surface but no palpable nodule.   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  Recheck uptake--plan is for another dose of RAI

## 2016-10-01 NOTE — Patient Instructions (Addendum)
blood tests are requested for you today.  We'll let you know about the results.   Please come back for a follow-up appointment in 2 months.

## 2016-10-06 ENCOUNTER — Other Ambulatory Visit: Payer: Self-pay | Admitting: Endocrinology

## 2016-10-06 DIAGNOSIS — E0590 Thyrotoxicosis, unspecified without thyrotoxic crisis or storm: Secondary | ICD-10-CM

## 2016-10-28 ENCOUNTER — Other Ambulatory Visit: Payer: Self-pay

## 2016-10-28 MED ORDER — SILDENAFIL CITRATE 100 MG PO TABS: 100.0000 mg | ORAL_TABLET | Freq: Every day | ORAL | 2 refills | Status: DC | PRN

## 2016-10-28 MED FILL — CITALOPRAM HBR 20 MG TABLET: 20 | 30 days supply | Qty: 30 | Fill #0

## 2016-10-30 ENCOUNTER — Ambulatory Visit (INDEPENDENT_AMBULATORY_CARE_PROVIDER_SITE_OTHER): Payer: 59 | Admitting: Adult Health

## 2016-10-30 ENCOUNTER — Encounter: Payer: Self-pay | Admitting: Adult Health

## 2016-10-30 VITALS — BP 156/90 | Temp 97.7°F | Ht 68.0 in | Wt 162.0 lb

## 2016-10-30 DIAGNOSIS — F329 Major depressive disorder, single episode, unspecified: Secondary | ICD-10-CM | POA: Diagnosis not present

## 2016-10-30 DIAGNOSIS — F419 Anxiety disorder, unspecified: Secondary | ICD-10-CM

## 2016-10-30 DIAGNOSIS — Z0000 Encounter for general adult medical examination without abnormal findings: Secondary | ICD-10-CM | POA: Diagnosis not present

## 2016-10-30 DIAGNOSIS — I10 Essential (primary) hypertension: Secondary | ICD-10-CM | POA: Diagnosis not present

## 2016-10-30 DIAGNOSIS — Z125 Encounter for screening for malignant neoplasm of prostate: Secondary | ICD-10-CM | POA: Diagnosis not present

## 2016-10-30 DIAGNOSIS — F17200 Nicotine dependence, unspecified, uncomplicated: Secondary | ICD-10-CM

## 2016-10-30 DIAGNOSIS — G8929 Other chronic pain: Secondary | ICD-10-CM | POA: Diagnosis not present

## 2016-10-30 DIAGNOSIS — M545 Low back pain: Secondary | ICD-10-CM | POA: Diagnosis not present

## 2016-10-30 LAB — CBC WITH DIFFERENTIAL/PLATELET
Basophils Absolute: 0 10*3/uL (ref 0.0–0.1)
Basophils Relative: 0.7 % (ref 0.0–3.0)
EOS ABS: 0.2 10*3/uL (ref 0.0–0.7)
EOS PCT: 3.5 % (ref 0.0–5.0)
HCT: 45.6 % (ref 39.0–52.0)
HEMOGLOBIN: 15.8 g/dL (ref 13.0–17.0)
Lymphocytes Relative: 26.8 % (ref 12.0–46.0)
Lymphs Abs: 1.5 10*3/uL (ref 0.7–4.0)
MCHC: 34.7 g/dL (ref 30.0–36.0)
MCV: 92.3 fl (ref 78.0–100.0)
MONO ABS: 1 10*3/uL (ref 0.1–1.0)
Monocytes Relative: 17.3 % — ABNORMAL HIGH (ref 3.0–12.0)
Neutro Abs: 2.8 10*3/uL (ref 1.4–7.7)
Neutrophils Relative %: 51.7 % (ref 43.0–77.0)
Platelets: 226 10*3/uL (ref 150.0–400.0)
RBC: 4.94 Mil/uL (ref 4.22–5.81)
RDW: 12.4 % (ref 11.5–15.5)
WBC: 5.5 10*3/uL (ref 4.0–10.5)

## 2016-10-30 LAB — BASIC METABOLIC PANEL
BUN: 10 mg/dL (ref 6–23)
CO2: 26 mEq/L (ref 19–32)
Calcium: 9.4 mg/dL (ref 8.4–10.5)
Chloride: 103 mEq/L (ref 96–112)
Creatinine, Ser: 0.61 mg/dL (ref 0.40–1.50)
GFR: 153.63 mL/min (ref >60.00)
Glucose, Bld: 108 mg/dL — ABNORMAL HIGH (ref 70–99)
POTASSIUM: 4.2 meq/L (ref 3.5–5.1)
SODIUM: 136 meq/L (ref 135–145)

## 2016-10-30 LAB — LIPID PANEL
CHOL/HDL RATIO: 4
Cholesterol: 145 mg/dL (ref 0–200)
HDL: 40.5 mg/dL (ref >39.00)
LDL CALC: 66 mg/dL (ref 0–99)
NONHDL: 104.54
Triglycerides: 191 mg/dL — ABNORMAL HIGH (ref 0.0–149.0)
VLDL: 38.2 mg/dL (ref 0.0–40.0)

## 2016-10-30 LAB — HEPATIC FUNCTION PANEL
ALK PHOS: 63 U/L (ref 39–117)
ALT: 20 U/L (ref 0–53)
AST: 17 U/L (ref 0–37)
Albumin: 4.4 g/dL (ref 3.5–5.2)
BILIRUBIN DIRECT: 0.1 mg/dL (ref 0.0–0.3)
BILIRUBIN TOTAL: 0.6 mg/dL (ref 0.2–1.2)
Total Protein: 6.5 g/dL (ref 6.0–8.3)

## 2016-10-30 LAB — PSA: PSA: 1.98 ng/mL (ref 0.10–4.00)

## 2016-10-30 LAB — HEMOGLOBIN A1C: HEMOGLOBIN A1C: 5.7 % (ref 4.6–6.5)

## 2016-10-30 MED ORDER — LISINOPRIL 10 MG PO TABS: 10.0000 mg | ORAL_TABLET | Freq: Every day | ORAL | 3 refills | Status: DC

## 2016-10-30 MED ORDER — VARENICLINE TARTRATE 0.5 MG X 11 & 1 MG X 42 PO MISC: ORAL | 0 refills | Status: DC

## 2016-10-30 MED ORDER — VARENICLINE TARTRATE 1 MG PO TABS: 1.0000 mg | ORAL_TABLET | Freq: Two times a day (BID) | ORAL | 3 refills | Status: DC

## 2016-10-30 MED FILL — LISINOPRIL 10 MG TABLET: 10 | 30 days supply | Qty: 30 | Fill #0

## 2016-10-30 MED FILL — CHANTIX 1 MG CONT MONTH BOX: 1 | 28 days supply | Qty: 56 | Fill #0

## 2016-10-30 NOTE — Progress Notes (Addendum)
Subjective:    Patient ID: Chad Wilson, male    DOB: 03-09-1976, 41 y.o.   MRN: [id number]  HPI  Patient presents for yearly preventative medicine examination. He is a pleasant 41 year old male who  has a past medical history of Acute hepatitis C without mention of hepatic coma(070.51); Alcohol abuse, unspecified; Anxiety states; Depression (05/27/2011); Essential hypertension, benign; Graves disease; Panic disorder without agoraphobia; Thyroid disease; and Tobacco use disorder.  All immunizations and health maintenance protocols were reviewed with the patient and needed orders were placed.  Appropriate screening laboratory values were ordered for the patient including screening of hyperlipidemia, renal function and hepatic function. If indicated by BPH, a PSA was ordered.  Medication reconciliation,  past medical history, social history, problem list and allergies were reviewed in detail with the patient  Goals were established with regard to weight loss, exercise, and  diet in compliance with medications  During his last visit he was started on Celexa 20 mg due to daily occurrence of panic attacks and bouts of depression. Today in the office he reports that he is feeling much better and feels as though his current dose is perfect for him. He feels less anxious and is starting to enjoy things he once did   Tobacco Use - he is interested in quitting and would like to try chantix. Currently he smokes about 1 pack per day   He is followed by Dr. Loanne Drilling for hx of Graves Disease. He reports that he is having radiation treatment at the end of this month.  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 He would like to se orthopedics to see if their is anything they can do.    Review of Systems  Constitutional: Negative.   HENT: Negative.   Eyes:  Negative.   Respiratory: Negative.   Cardiovascular: Negative.   Gastrointestinal: Negative.   Endocrine: Negative.   Genitourinary: Negative.   Musculoskeletal: Positive for arthralgias and back pain.  Skin: Negative.   Allergic/Immunologic: Negative.   Neurological: Negative.   Hematological: Negative.   Psychiatric/Behavioral: Negative.   All other systems reviewed and are negative.  Past Medical History:  Diagnosis Date  . Acute hepatitis C without mention of hepatic coma(070.51)    has been cleared   . Alcohol abuse, unspecified   . Anxiety states   . Depression 05/27/2011  . Essential hypertension, benign   . Graves disease    treated by Dr Loanne Drilling  . Panic disorder without agoraphobia   . Thyroid disease    Graves Disease  . Tobacco use disorder     Social History   Social History  . Marital status: Married    Spouse name: N/A  . Number of children: 0  . Years of education: N/A   Occupational History  .  [employer]   Social History Main Topics  . Smoking status: Current Every Day Smoker    Packs/day: 1.00    Years: 30.00  . Smokeless tobacco: Never Used  . Alcohol use Yes     Comment: socially   . Drug use: No     Comment: cocaine  . Sexual activity: Not on file   Other Topics Concern  . Not on file   Social History Narrative   He is working in [employer]  Diet: Fruits and veggies, water poweraid, occ sweet tea    No past surgical history on file.  Family History  Problem Relation Age of Onset  . Anxiety disorder Father   . Heart disease Father   . Hypertension Father   . Thyroid disease Mother   . Anxiety disorder Sister   . Hyperthyroidism Sister     No Known Allergies  Current Outpatient Prescriptions on File Prior to Visit  Medication Sig Dispense Refill  . citalopram (CELEXA) 20 MG tablet Take 1 tablet (20 mg total) by mouth daily. 30 tablet 3  . sildenafil (VIAGRA) 100 MG tablet Take 1 tablet (100 mg total) by  mouth daily as needed for erectile dysfunction. 10 tablet 2  . [DISCONTINUED] omeprazole (PRILOSEC) 20 MG capsule Take 1 capsule (20 mg total) by mouth daily. 30 capsule 3   No current facility-administered medications on file prior to visit.     BP (!) 156/90 (BP Location: Left Arm)   Temp 97.7 F (36.5 C) (Oral)   Ht 5' 8"  (1.727 m)   Wt 162 lb (73.5 kg)   BMI 24.63 kg/m       Objective:   Physical Exam  Constitutional: He is oriented to person, place, and time. He appears well-developed and well-nourished. No distress.  HENT:  Head: Normocephalic and atraumatic.  Right Ear: External ear normal.  Left Ear: External ear normal.  Nose: Nose normal.  Mouth/Throat: Oropharynx is clear and moist. No oropharyngeal exudate.  Eyes: Pupils are equal, round, and reactive to light. Conjunctivae are normal. Right eye exhibits no discharge. Left eye exhibits no discharge. No scleral icterus.  Neck: Normal range of motion. Neck supple. No JVD present. Carotid bruit is not present. No tracheal deviation present. No thyromegaly present.  Cardiovascular: Normal rate, regular rhythm, normal heart sounds and intact distal pulses.  Exam reveals no gallop and no friction rub.   No murmur heard. Pulmonary/Chest: Effort normal and breath sounds normal. No stridor. No respiratory distress. He has no wheezes. He has no rales. He exhibits no tenderness.  Abdominal: Soft. Bowel sounds are normal. He exhibits no distension and no mass. There is no tenderness. There is no rebound and no guarding.  Musculoskeletal: Normal range of motion. He exhibits no edema, tenderness or deformity.  Lymphadenopathy:    He has no cervical adenopathy.  Neurological: He is alert and oriented to person, place, and time. He has normal reflexes. He displays normal reflexes. No cranial nerve deficit. He exhibits normal muscle tone. Coordination normal.  Skin: Skin is warm and dry. No rash noted. He is not diaphoretic. No  erythema. No pallor.  Psychiatric: He has a normal mood and affect. His behavior is normal. Judgment and thought content normal.  Nursing note and vitals reviewed.      Assessment & Plan:  1. Routine general medical examination at a health care facility - Basic metabolic panel - CBC with Differential/Platelet - Hemoglobin A1c - Hepatic function panel - Lipid panel - PSA  2. Anxiety and depression - Celexa seems to be working well for him. No change in medication at this time   3. TOBACCO ABUSE Trial of chantix. Common side effects including rare risk of suicide ideation was discussed with the patient today.  Patient is instructed to go directly to the ED if this occurs.  We discussed that patient can continue to smoke for 1 week after starting chantix, but then must discontinue cigarettes.  Follow up in one month  5 minutes spent with patient today on tobacco cessation counseling.   - varenicline (CHANTIX CONTINUING MONTH PAK) 1 MG tablet; Take 1 tablet (1 mg total) by mouth 2 (two) times daily.  Dispense: 60 tablet; Refill: 3 - varenicline (CHANTIX STARTING MONTH PAK) 0.5 MG X 11 & 1 MG X 42 tablet; Take one 0.5 mg tablet by mouth once daily for 3 days, then increase to one 0.5 mg tablet twice daily for 4 days, then increase to one 1 mg tablet twice daily.  Dispense: 53 tablet; Refill: 0  4. Chronic bilateral low back pain without sciatica  - AMB referral to orthopedics  5. Essential hypertension - Will start on lisinopril 10 mg. Follow up in one month  - Basic metabolic panel - CBC with Differential/Platelet - Hemoglobin A1c - Hepatic function panel - Lipid panel - PSA - lisinopril (PRINIVIL,ZESTRIL) 10 MG tablet; Take 1 tablet (10 mg total) by mouth daily.  Dispense: 30 tablet; Refill: 3  Dorothyann Peng, NP

## 2016-10-30 NOTE — Patient Instructions (Signed)
It was great seeing you today   I have sent in two prescriptions   1. Chantix to help you quit smoking   2. Lisinopril to help with blood pressure control   Follow up with me in one month or sooner if needed  I will call you today with the results of your blood work

## 2016-11-06 ENCOUNTER — Telehealth: Payer: Self-pay | Admitting: Endocrinology

## 2016-11-06 ENCOUNTER — Telehealth: Payer: Self-pay

## 2016-11-06 ENCOUNTER — Ambulatory Visit (HOSPITAL_COMMUNITY): Admission: RE | Admit: 2016-11-06 | Payer: 59 | Source: Ambulatory Visit

## 2016-11-06 ENCOUNTER — Other Ambulatory Visit: Payer: Self-pay

## 2016-11-06 NOTE — Telephone Encounter (Signed)
Pt needs refill on viagra please

## 2016-11-06 NOTE — Telephone Encounter (Signed)
Called and spoke with patient to clarify that viagra prescription was sent last week with two refills.

## 2016-11-06 NOTE — Telephone Encounter (Signed)
Called and informed patient that prescription was sent in last week & was sent with 2 refills.

## 2016-11-06 NOTE — Telephone Encounter (Signed)
Was refilled last week

## 2016-11-07 ENCOUNTER — Encounter (HOSPITAL_COMMUNITY): Payer: 59

## 2016-11-12 ENCOUNTER — Ambulatory Visit (INDEPENDENT_AMBULATORY_CARE_PROVIDER_SITE_OTHER): Payer: Self-pay | Admitting: Orthopaedic Surgery

## 2016-11-26 MED FILL — LISINOPRIL 10 MG TABLET: 10 | 30 days supply | Qty: 30 | Fill #1

## 2016-11-26 MED FILL — CITALOPRAM HBR 20 MG TABLET: 20 | 30 days supply | Qty: 30 | Fill #1

## 2016-11-28 MED FILL — SILDENAFIL 20 MG TABLET: 20 | 10 days supply | Qty: 50 | Fill #0

## 2016-12-02 ENCOUNTER — Ambulatory Visit: Payer: Self-pay | Admitting: Endocrinology

## 2016-12-05 ENCOUNTER — Ambulatory Visit: Payer: 59 | Admitting: Adult Health

## 2016-12-05 ENCOUNTER — Encounter (HOSPITAL_COMMUNITY)
Admission: RE | Admit: 2016-12-05 | Discharge: 2016-12-05 | Disposition: A | Discharge status: Home / Self Care | Payer: 59 | Source: Ambulatory Visit | Attending: Endocrinology | Admitting: Endocrinology

## 2016-12-05 DIAGNOSIS — E0590 Thyrotoxicosis, unspecified without thyrotoxic crisis or storm: Secondary | ICD-10-CM | POA: Insufficient documentation

## 2016-12-06 ENCOUNTER — Encounter: Payer: Self-pay | Admitting: Adult Health

## 2016-12-06 ENCOUNTER — Encounter (HOSPITAL_COMMUNITY)
Admission: RE | Admit: 2016-12-06 | Discharge: 2016-12-06 | Disposition: A | Discharge status: Home / Self Care | Payer: 59 | Source: Ambulatory Visit | Attending: Endocrinology | Admitting: Endocrinology

## 2016-12-06 DIAGNOSIS — E0590 Thyrotoxicosis, unspecified without thyrotoxic crisis or storm: Secondary | ICD-10-CM | POA: Diagnosis not present

## 2016-12-06 DIAGNOSIS — E0500 Thyrotoxicosis with diffuse goiter without thyrotoxic crisis or storm: Secondary | ICD-10-CM | POA: Diagnosis not present

## 2016-12-06 MED ORDER — SODIUM IODIDE I 131 CAPSULE
8.0000 | Freq: Once | INTRAVENOUS | Status: AC | PRN
  Administered 2016-12-06: 8 via ORAL

## 2016-12-07 ENCOUNTER — Other Ambulatory Visit: Payer: Self-pay | Admitting: Endocrinology

## 2016-12-07 DIAGNOSIS — E0590 Thyrotoxicosis, unspecified without thyrotoxic crisis or storm: Secondary | ICD-10-CM

## 2016-12-11 MED FILL — SILDENAFIL 20 MG TABLET: 20 | 10 days supply | Qty: 50 | Fill #1

## 2016-12-19 ENCOUNTER — Other Ambulatory Visit: Payer: Self-pay | Admitting: Adult Health

## 2016-12-19 ENCOUNTER — Ambulatory Visit (INDEPENDENT_AMBULATORY_CARE_PROVIDER_SITE_OTHER): Payer: 59 | Admitting: Adult Health

## 2016-12-19 ENCOUNTER — Encounter: Payer: Self-pay | Admitting: Adult Health

## 2016-12-19 VITALS — BP 144/80 | Temp 97.9°F | Wt 165.0 lb

## 2016-12-19 DIAGNOSIS — F17200 Nicotine dependence, unspecified, uncomplicated: Secondary | ICD-10-CM | POA: Diagnosis not present

## 2016-12-19 DIAGNOSIS — F419 Anxiety disorder, unspecified: Principal | ICD-10-CM

## 2016-12-19 DIAGNOSIS — Z23 Encounter for immunization: Secondary | ICD-10-CM | POA: Diagnosis not present

## 2016-12-19 DIAGNOSIS — I10 Essential (primary) hypertension: Secondary | ICD-10-CM

## 2016-12-19 DIAGNOSIS — F329 Major depressive disorder, single episode, unspecified: Secondary | ICD-10-CM

## 2016-12-19 MED ORDER — LISINOPRIL 30 MG PO TABS: 30.0000 mg | ORAL_TABLET | Freq: Every day | ORAL | 1 refills | Status: DC

## 2016-12-19 MED ORDER — CITALOPRAM HYDROBROMIDE 40 MG PO TABS: 40.0000 mg | ORAL_TABLET | Freq: Every day | ORAL | 1 refills | Status: DC

## 2016-12-19 MED FILL — LISINOPRIL 30 MG TABLET: 30 | 90 days supply | Qty: 90 | Fill #0 | Status: TO

## 2016-12-19 MED FILL — CITALOPRAM HBR 40 MG TABLET: 40 | 90 days supply | Qty: 90 | Fill #0 | Status: TO

## 2016-12-19 NOTE — Progress Notes (Signed)
Subjective:    Patient ID: Chad Wilson, male    DOB: 1976/03/01, 41 y.o.   MRN: [id number]  HPI  41 year old male who  has a past medical history of Acute hepatitis C without mention of hepatic coma(070.51); Alcohol abuse, unspecified; Anxiety states; Depression (05/27/2011); Essential hypertension, benign; Graves disease; Panic disorder without agoraphobia; Thyroid disease; and Tobacco use disorder. He presents to the office today for one month follow up after starting Chantix to help him quit smoking and lisinopril 10 mg for elevated blood pressure readings.   BP Readings from Last 3 Encounters:  12/19/16 (!) 144/80  10/30/16 (!) 156/90  10/01/16 (!) 160/96   Today in the office he reports that he took Chantix for about three weeks but was experiencing side effects of sleep walking and agitation. These symptoms went away when he stopped chantix. He is looking for another aid to help him quit smoking. He has never tried the patch or gum. Currently smoking 1 pack per day.   He also reports that his blood pressure has been coming down slowly. About one week ago he increased his dose to 20 mg daily. Denies any side effects of this medication   Review of Systems  Constitutional: Negative.   Respiratory: Negative.   Cardiovascular: Negative.   Gastrointestinal: Negative.   Genitourinary: Negative.   Musculoskeletal: Negative.   Skin: Negative.   Neurological: Negative.   Hematological: Negative.   Psychiatric/Behavioral: Positive for agitation, confusion and sleep disturbance.  All other systems reviewed and are negative.  Past Medical History:  Diagnosis Date  . Acute hepatitis C without mention of hepatic coma(070.51)    has been cleared   . Alcohol abuse, unspecified   . Anxiety states   . Depression 05/27/2011  . Essential hypertension, benign   . Graves disease    treated by Dr Loanne Drilling  . Panic disorder without agoraphobia   . Thyroid disease    Graves Disease  .  Tobacco use disorder     Social History   Social History  . Marital status: Married    Spouse name: N/A  . Number of children: 0  . Years of education: N/A   Occupational History  .  [employer]   Social History Main Topics  . Smoking status: Current Every Day Smoker    Packs/day: 1.00    Years: 30.00  . Smokeless tobacco: Never Used  . Alcohol use Yes     Comment: socially   . Drug use: No     Comment: cocaine  . Sexual activity: Not on file   Other Topics Concern  . Not on file   Social History Narrative   He is working in Architect    Diet: Fruits and veggies, water poweraid, occ sweet tea    No past surgical history on file.  Family History  Problem Relation Age of Onset  . Anxiety disorder Father   . Heart disease Father   . Hypertension Father   . Thyroid disease Mother   . Anxiety disorder Sister   . Hyperthyroidism Sister     No Known Allergies  Current Outpatient Prescriptions on File Prior to Visit  Medication Sig Dispense Refill  . citalopram (CELEXA) 20 MG tablet Take 1 tablet (20 mg total) by mouth daily. 30 tablet 3  . sildenafil (VIAGRA) 100 MG tablet Take 1 tablet (100 mg total) by mouth daily as needed for erectile dysfunction. 10 tablet 2  . [DISCONTINUED] omeprazole (PRILOSEC)  20 MG capsule Take 1 capsule (20 mg total) by mouth daily. 30 capsule 3   No current facility-administered medications on file prior to visit.     BP (!) 144/80 (BP Location: Left Arm)   Temp 97.9 F (36.6 C) (Oral)   Wt 165 lb (74.8 kg)   BMI 25.09 kg/m       Objective:   Physical Exam  Constitutional: He is oriented to person, place, and time. He appears well-developed and well-nourished. No distress.  HENT:  Head: Normocephalic and atraumatic.  Right Ear: External ear normal.  Left Ear: External ear normal.  Nose: Nose normal.  Mouth/Throat: Oropharynx is clear and moist. No oropharyngeal exudate.  Eyes: Pupils are equal, round, and  reactive to light. Conjunctivae and EOM are normal. Right eye exhibits no discharge. Left eye exhibits no discharge. No scleral icterus.  Cardiovascular: Normal rate, regular rhythm, normal heart sounds and intact distal pulses.  Exam reveals no gallop and no friction rub.   No murmur heard. Pulmonary/Chest: Effort normal and breath sounds normal. No respiratory distress. He has no wheezes. He has no rales. He exhibits no tenderness.  Neurological: He is alert and oriented to person, place, and time. He has normal reflexes. He displays normal reflexes. No cranial nerve deficit. He exhibits normal muscle tone. Coordination normal.  Skin: Skin is warm and dry. No rash noted. He is not diaphoretic. No erythema. No pallor.  Psychiatric: He has a normal mood and affect. His behavior is normal. Judgment and thought content normal.  Nursing note and vitals reviewed.     Assessment & Plan:  1. Essential hypertension - Will increase dose to 30 mg. Send me results via mychart in 2 weeks  - lisinopril (PRINIVIL,ZESTRIL) 30 MG tablet; Take 1 tablet (30 mg total) by mouth daily.  Dispense: 90 tablet; Refill: 1  2. TOBACCO ABUSE - He is going to try gum or patch. If this does not work then we will try Wellbutrin. Will have to taper off Celexa ( which he is doing well on)  3. Need for prophylactic vaccination and inoculation against influenza  - Flu Vaccine QUAD 6+ mos PF IM (Fluarix Quad PF)  Dorothyann Peng, NP

## 2016-12-19 NOTE — Patient Instructions (Addendum)
[email or web address]

## 2016-12-26 ENCOUNTER — Encounter (HOSPITAL_COMMUNITY): Payer: 59

## 2016-12-27 MED FILL — SILDENAFIL 20 MG TABLET: 20 | 10 days supply | Qty: 50 | Fill #2

## 2016-12-31 ENCOUNTER — Ambulatory Visit (HOSPITAL_COMMUNITY)
Admission: RE | Admit: 2016-12-31 | Discharge: 2016-12-31 | Disposition: A | Discharge status: Home / Self Care | Payer: 59 | Source: Ambulatory Visit | Attending: Endocrinology | Admitting: Endocrinology

## 2016-12-31 ENCOUNTER — Ambulatory Visit (INDEPENDENT_AMBULATORY_CARE_PROVIDER_SITE_OTHER): Payer: 59

## 2016-12-31 ENCOUNTER — Ambulatory Visit (INDEPENDENT_AMBULATORY_CARE_PROVIDER_SITE_OTHER): Payer: 59 | Admitting: Orthopaedic Surgery

## 2016-12-31 ENCOUNTER — Encounter (INDEPENDENT_AMBULATORY_CARE_PROVIDER_SITE_OTHER): Payer: Self-pay | Admitting: Orthopaedic Surgery

## 2016-12-31 DIAGNOSIS — G8929 Other chronic pain: Secondary | ICD-10-CM

## 2016-12-31 DIAGNOSIS — M542 Cervicalgia: Secondary | ICD-10-CM

## 2016-12-31 DIAGNOSIS — M5441 Lumbago with sciatica, right side: Secondary | ICD-10-CM | POA: Diagnosis not present

## 2016-12-31 DIAGNOSIS — M5442 Lumbago with sciatica, left side: Secondary | ICD-10-CM | POA: Diagnosis not present

## 2016-12-31 DIAGNOSIS — E0590 Thyrotoxicosis, unspecified without thyrotoxic crisis or storm: Secondary | ICD-10-CM | POA: Diagnosis not present

## 2016-12-31 MED ORDER — TRAMADOL HCL 50 MG PO TABS: 50.0000 mg | ORAL_TABLET | Freq: Two times a day (BID) | ORAL | 0 refills | Status: DC

## 2016-12-31 MED ORDER — SODIUM IODIDE I 131 CAPSULE
40.0000 | Freq: Once | INTRAVENOUS | Status: AC | PRN
  Administered 2016-12-31: 40 via ORAL

## 2016-12-31 MED FILL — traMADol HCL 50 MG TABS: 50 | 15 days supply | Qty: 30 | Fill #0

## 2016-12-31 NOTE — Progress Notes (Signed)
Office Visit Note   Patient: Chad Wilson           Date of Birth: January 07, 1976           MRN: [id number] Visit Date: 12/31/2016              Requested by: Dorothyann Peng, NP [address], [place] [address] PCP: Dorothyann Peng, NP   Assessment & Plan: Visit Diagnoses:  1. Neck pain   2. Chronic midline low back pain with bilateral sciatica     Plan: Patient is exhibiting signs of myelopathy given spasticity and positive Homans reflex.  Recommend MRI of the cervical spine and lumbar spine to evaluate for myelopathy. Prescription for tramadol. Follow-up after the MRI. Follow-Up Instructions: Return in about 2 weeks (around 01/14/2017).   Orders:  Orders Placed This Encounter  Procedures  . XR Cervical Spine 2 or 3 views  . XR Lumbar Spine 2-3 Views  . MR Lumbar Spine w/o contrast  . MR Cervical Spine w/o contrast   Meds ordered this encounter  Medications  . traMADol (ULTRAM) 50 MG tablet    Sig: Take 1 tablet (50 mg total) by mouth 2 (two) times daily.    Dispense:  30 tablet    Refill:  0      Procedures: No procedures performed   Clinical Data: No additional findings.   Subjective: Chief Complaint  Patient presents with  . Neck - Pain  . Lower Back - Pain    Patient is a 41 year old gentleman comes in with chronic neck and back pain that is getting worse with activity. He also endorses occasional radiation of pain into the legs. Denies any numbness and tingling or arm pain. Denies any issues with dexterity or maintaining balance. He takes Goody's powder for the pain. Denies any injuries. Denies any bowel or bladder dysfunction.    Review of Systems  Constitutional: Negative.   All other systems reviewed and are negative.    Objective: Vital Signs: There were no vitals taken for this visit.  Physical Exam  Constitutional: He is oriented to person, place, and time. He appears well-developed and well-nourished.  HENT:  Head:  Normocephalic and atraumatic.  Eyes: Pupils are equal, round, and reactive to light.  Neck: Neck supple.  Pulmonary/Chest: Effort normal.  Abdominal: Soft.  Musculoskeletal: Normal range of motion.  Neurological: He is alert and oriented to person, place, and time.  Skin: Skin is warm.  Psychiatric: He has a normal mood and affect. His behavior is normal. Judgment and thought content normal.  Nursing note and vitals reviewed.   Ortho Exam Cervical spine exam shows limited range of motion secondary to guarding and pain. He has no focal motor sensory deficits. He does have hyperreflexic biceps and brachioradialis reflexes. Positive Homans sign.  Bilateral lower extremity exam shows no focal motor sensory deficits. No sciatic tension signs. Hyperreflexic patellar reflex. 3 beats of clonus. Specialty Comments:  No specialty comments available.  Imaging: Xr Cervical Spine 2 Or 3 Views  Result Date: 12/31/2016 Mild degenerative disc disease of the lower cervical vertebrae  Xr Lumbar Spine 2-3 Views  Result Date: 12/31/2016 Lumbar spondylosis    PMFS History: Patient Active Problem List   Diagnosis Date Noted  . Chronic midline low back pain with bilateral sciatica 12/31/2016  . Hyperthyroidism 03/20/2016  . Neck pain 09/06/2011  . Cervical radiculopathy at C6 09/06/2011  . JAUNDICE 05/21/2010  . HEPATITIS C 11/21/2009  . Anxiety and depression  11/21/2009  . PANIC ATTACK 11/21/2009  . Alcohol abuse 11/21/2009  . TOBACCO ABUSE 11/21/2009  . HYPERTENSION, BENIGN ESSENTIAL 11/21/2009  . FATIGUE 11/21/2009   Past Medical History:  Diagnosis Date  . Acute hepatitis C without mention of hepatic coma(070.51)    has been cleared   . Alcohol abuse, unspecified   . Anxiety states   . Depression 05/27/2011  . Essential hypertension, benign   . Graves disease    treated by Dr Loanne Drilling  . Panic disorder without agoraphobia   . Thyroid disease    Graves Disease  . Tobacco use  disorder     Family History  Problem Relation Age of Onset  . Anxiety disorder Father   . Heart disease Father   . Hypertension Father   . Thyroid disease Mother   . Anxiety disorder Sister   . Hyperthyroidism Sister     No past surgical history on file. Social History   Occupational History  .  [employer]   Social History Main Topics  . Smoking status: Current Every Day Smoker    Packs/day: 1.00    Years: 30.00  . Smokeless tobacco: Never Used  . Alcohol use Yes     Comment: socially   . Drug use: No     Comment: cocaine  . Sexual activity: Not on file

## 2017-01-02 ENCOUNTER — Ambulatory Visit (HOSPITAL_COMMUNITY): Payer: 59

## 2017-01-09 ENCOUNTER — Telehealth (INDEPENDENT_AMBULATORY_CARE_PROVIDER_SITE_OTHER): Payer: Self-pay | Admitting: Orthopaedic Surgery

## 2017-01-09 ENCOUNTER — Other Ambulatory Visit (INDEPENDENT_AMBULATORY_CARE_PROVIDER_SITE_OTHER): Payer: Self-pay | Admitting: Orthopaedic Surgery

## 2017-01-09 MED ORDER — PREDNISONE 10 MG PO TABS: ORAL_TABLET | ORAL | 0 refills | Status: DC

## 2017-01-09 MED FILL — predniSONE 10 MG TABS: 10 | 30 days supply | Qty: 60 | Fill #0

## 2017-01-09 NOTE — Telephone Encounter (Signed)
This is a [facility] patient and he is sched for MRI Lsp and Csp on 01/11/17.  He is not having any relief from the tramadol and is asking for something for pain.  Can you advise? Erlinda Hong is in surgery this pm.

## 2017-01-09 NOTE — Telephone Encounter (Signed)
Patient's wife Alyse Low) called advised the Tramadol patient is taking is not helping with the pain. She asked if there is something stronger he can get prescribed for him. The number to contact Alyse Low is [phone]

## 2017-01-09 NOTE — Telephone Encounter (Signed)
Call in prednisone 109m, take 2 qam for pain, #60

## 2017-01-09 NOTE — Telephone Encounter (Signed)
Yes approve

## 2017-01-09 NOTE — Telephone Encounter (Signed)
Rx sent to pharm, [facility] and advised.

## 2017-01-11 ENCOUNTER — Other Ambulatory Visit: Payer: 59

## 2017-01-12 MED FILL — traMADol HCL 50 MG TABS: 50 | 15 days supply | Qty: 30 | Fill #0

## 2017-01-14 ENCOUNTER — Ambulatory Visit (INDEPENDENT_AMBULATORY_CARE_PROVIDER_SITE_OTHER): Payer: 59 | Admitting: Orthopaedic Surgery

## 2017-01-16 ENCOUNTER — Other Ambulatory Visit: Payer: Self-pay | Admitting: Endocrinology

## 2017-01-16 MED FILL — SILDENAFIL 20 MG TABLET: 20 | 10 days supply | Qty: 50 | Fill #0

## 2017-01-23 ENCOUNTER — Ambulatory Visit (INDEPENDENT_AMBULATORY_CARE_PROVIDER_SITE_OTHER): Payer: 59 | Admitting: Endocrinology

## 2017-01-23 ENCOUNTER — Encounter: Payer: Self-pay | Admitting: Endocrinology

## 2017-01-23 VITALS — BP 142/92 | HR 73 | Wt 171.8 lb

## 2017-01-23 DIAGNOSIS — E0590 Thyrotoxicosis, unspecified without thyrotoxic crisis or storm: Secondary | ICD-10-CM | POA: Diagnosis not present

## 2017-01-23 LAB — TSH: TSH: 5.18 u[IU]/mL — AB (ref 0.35–4.50)

## 2017-01-23 LAB — T4, FREE: FREE T4: 0.26 ng/dL — AB (ref 0.60–1.60)

## 2017-01-23 MED ORDER — LEVOTHYROXINE SODIUM 50 MCG PO TABS: 50.0000 ug | ORAL_TABLET | Freq: Every day | ORAL | 1 refills | Status: DC

## 2017-01-23 NOTE — Progress Notes (Signed)
Subjective:    Patient ID: Chad Wilson, male    DOB: [birth year], 41 y.o.   MRN: [id number]  HPI Pt returns for f/u of hyperthyroidism (due for Grave's Dz; dx'ed 2017, when he was incarcerated; he took tapazole, but this was stopped for RAI, which he had in Feb, and again in Oct of 2018).  pt states he feels well in general.  Past Medical History:  Diagnosis Date  . Acute hepatitis C without mention of hepatic coma(070.51)    has been cleared   . Alcohol abuse, unspecified   . Anxiety states   . Depression 05/27/2011  . Essential hypertension, benign   . Graves disease    treated by Dr Loanne Drilling  . Panic disorder without agoraphobia   . Thyroid disease    Graves Disease  . Tobacco use disorder     History reviewed. No pertinent surgical history.  Social History   Socioeconomic History  . Marital status: Married    Spouse name: Not on file  . Number of children: 0  . Years of education: Not on file  . Highest education level: Not on file  Social Needs  . Financial resource strain: Not on file  . Food insecurity - worry: Not on file  . Food insecurity - inability: Not on file  . Transportation needs - medical: Not on file  . Transportation needs - non-medical: Not on file  Occupational History    Employer: [employer]  Tobacco Use  . Smoking status: Current Every Day Smoker    Packs/day: 1.00    Years: 30.00    Pack years: 30.00  . Smokeless tobacco: Never Used  Substance and Sexual Activity  . Alcohol use: Yes    Comment: socially   . Drug use: No    Comment: cocaine  . Sexual activity: Not on file  Other Topics Concern  . Not on file  Social History Narrative   He is working in Architect    Diet: Fruits and veggies, water poweraid, occ sweet tea    Current Outpatient Medications on File Prior to Visit  Medication Sig Dispense Refill  . citalopram (CELEXA) 40 MG tablet Take 1 tablet (40 mg total) by mouth daily. 90 tablet 1  .  lisinopril (PRINIVIL,ZESTRIL) 30 MG tablet Take 1 tablet (30 mg total) by mouth daily. 90 tablet 1  . sildenafil (REVATIO) 20 MG tablet TAKE 5 TABLETS BY MOUTH EVERY DAY AS NEEDED FOR ERECTILE DYSFUNCTION 50 tablet 2  . sildenafil (VIAGRA) 100 MG tablet Take 1 tablet (100 mg total) by mouth daily as needed for erectile dysfunction. 10 tablet 2  . traMADol (ULTRAM) 50 MG tablet Take 1 tablet (50 mg total) by mouth 2 (two) times daily. 30 tablet 0  . [DISCONTINUED] omeprazole (PRILOSEC) 20 MG capsule Take 1 capsule (20 mg total) by mouth daily. 30 capsule 3   No current facility-administered medications on file prior to visit.     Allergies  Allergen Reactions  . Chantix [Varenicline] Other (See Comments)    Agitation and sleep walking     Family History  Problem Relation Age of Onset  . Anxiety disorder Father   . Heart disease Father   . Hypertension Father   . Thyroid disease Mother   . Anxiety disorder Sister   . Hyperthyroidism Sister     BP (!) 142/92 (BP Location: Left Arm, Patient Position: Sitting, Cuff Size: Normal)   Pulse 73   Wt 171 lb 12.8  oz (77.9 kg)   SpO2 95%   BMI 26.12 kg/m    Review of Systems He has regained weight.     Objective:   Physical Exam VITAL SIGNS:  See vs page.  GENERAL: no distress.  eyes: slight bilat periorbital swelling and proptosis.   NECK: thyroid is approx twice normal size, R>L.  There is an irreg surface but no palpable nodule.     Lab Results  Component Value Date   TSH 5.18 (H) 01/23/2017      Assessment & Plan:  Post-RAI hypothyroidism, new.  I have sent a prescription to your pharmacy, for synthroid Please come back for a follow-up appointment in 1 month.

## 2017-01-23 NOTE — Patient Instructions (Addendum)
blood tests are requested for you today.  We'll let you know about the results.  Please come back for a follow-up appointment in 1 month.

## 2017-01-30 MED FILL — SILDENAFIL 20 MG TABLET: 20 | 10 days supply | Qty: 50 | Fill #1

## 2017-02-02 ENCOUNTER — Ambulatory Visit
Admission: RE | Admit: 2017-02-02 | Discharge: 2017-02-02 | Disposition: A | Discharge status: Home / Self Care | Payer: 59 | Source: Ambulatory Visit | Attending: Orthopaedic Surgery | Admitting: Orthopaedic Surgery

## 2017-02-02 DIAGNOSIS — S199XXA Unspecified injury of neck, initial encounter: Secondary | ICD-10-CM | POA: Diagnosis not present

## 2017-02-02 DIAGNOSIS — G8929 Other chronic pain: Secondary | ICD-10-CM

## 2017-02-02 DIAGNOSIS — M5441 Lumbago with sciatica, right side: Principal | ICD-10-CM

## 2017-02-02 DIAGNOSIS — M542 Cervicalgia: Secondary | ICD-10-CM

## 2017-02-02 DIAGNOSIS — M5442 Lumbago with sciatica, left side: Principal | ICD-10-CM

## 2017-02-02 DIAGNOSIS — S3992XA Unspecified injury of lower back, initial encounter: Secondary | ICD-10-CM | POA: Diagnosis not present

## 2017-02-02 DIAGNOSIS — M545 Low back pain: Secondary | ICD-10-CM | POA: Diagnosis not present

## 2017-02-10 ENCOUNTER — Encounter (INDEPENDENT_AMBULATORY_CARE_PROVIDER_SITE_OTHER): Payer: Self-pay | Admitting: Orthopaedic Surgery

## 2017-02-11 ENCOUNTER — Other Ambulatory Visit (INDEPENDENT_AMBULATORY_CARE_PROVIDER_SITE_OTHER): Payer: Self-pay

## 2017-02-11 ENCOUNTER — Other Ambulatory Visit (INDEPENDENT_AMBULATORY_CARE_PROVIDER_SITE_OTHER): Payer: Self-pay | Admitting: Orthopaedic Surgery

## 2017-02-11 DIAGNOSIS — M542 Cervicalgia: Secondary | ICD-10-CM

## 2017-02-11 MED FILL — SILDENAFIL 20 MG TABLET: 20 | 10 days supply | Qty: 50 | Fill #2

## 2017-02-11 NOTE — Telephone Encounter (Signed)
yes

## 2017-02-11 NOTE — Telephone Encounter (Signed)
Need referral to [facility] for c spine ESI

## 2017-02-12 MED FILL — traMADol HCL 50 MG TABS: 50 | 15 days supply | Qty: 30 | Fill #0

## 2017-02-12 NOTE — Telephone Encounter (Signed)
Just of the neck unless Dr. Ernestina Patches feels like his back also needs injection.  I do not see anything very impressive on the lumbar spine MRI.  Norco No. 20.  One time only.

## 2017-02-12 NOTE — Telephone Encounter (Signed)
Called into pharm  

## 2017-02-13 ENCOUNTER — Other Ambulatory Visit (INDEPENDENT_AMBULATORY_CARE_PROVIDER_SITE_OTHER): Payer: Self-pay

## 2017-02-13 MED ORDER — HYDROCODONE-ACETAMINOPHEN 5-325 MG PO TABS: 1.0000 | ORAL_TABLET | Freq: Every day | ORAL | 0 refills | Status: DC | PRN

## 2017-02-24 ENCOUNTER — Ambulatory Visit: Payer: 59 | Admitting: Endocrinology

## 2017-02-26 ENCOUNTER — Other Ambulatory Visit: Payer: Self-pay | Admitting: Endocrinology

## 2017-02-26 ENCOUNTER — Encounter (INDEPENDENT_AMBULATORY_CARE_PROVIDER_SITE_OTHER): Payer: 59 | Admitting: Physical Medicine and Rehabilitation

## 2017-02-27 MED FILL — SILDENAFIL 20 MG TABLET: 20 | 10 days supply | Qty: 50 | Fill #0

## 2017-02-28 ENCOUNTER — Telehealth: Payer: Self-pay

## 2017-02-28 NOTE — Telephone Encounter (Signed)
Left vm requesting patient call back so that we can reschedule his 12/10 appointment that was canceled by [place] due to weather

## 2017-03-12 ENCOUNTER — Other Ambulatory Visit (INDEPENDENT_AMBULATORY_CARE_PROVIDER_SITE_OTHER): Payer: Self-pay | Admitting: Orthopaedic Surgery

## 2017-03-12 NOTE — Telephone Encounter (Signed)
Approve #20.

## 2017-03-12 NOTE — Telephone Encounter (Signed)
Chad Wilson, can you print for tomorrow?  For Chad Wilson to sign?

## 2017-03-12 NOTE — Telephone Encounter (Signed)
Per 02/10/17 note, norco was refilled, one time only.  Patient is asking for refill at this time, please advise?

## 2017-03-13 ENCOUNTER — Other Ambulatory Visit: Payer: 59

## 2017-03-13 ENCOUNTER — Other Ambulatory Visit (INDEPENDENT_AMBULATORY_CARE_PROVIDER_SITE_OTHER): Payer: Self-pay

## 2017-03-13 MED ORDER — HYDROCODONE-ACETAMINOPHEN 5-325 MG PO TABS: 1.0000 | ORAL_TABLET | Freq: Every day | ORAL | 0 refills | Status: DC | PRN

## 2017-03-17 MED FILL — SILDENAFIL 20 MG TABLET: 20 | 10 days supply | Qty: 50 | Fill #1 | Status: TO

## 2017-03-26 ENCOUNTER — Ambulatory Visit (INDEPENDENT_AMBULATORY_CARE_PROVIDER_SITE_OTHER): Payer: Self-pay

## 2017-03-26 ENCOUNTER — Ambulatory Visit (INDEPENDENT_AMBULATORY_CARE_PROVIDER_SITE_OTHER): Payer: 59 | Admitting: Physical Medicine and Rehabilitation

## 2017-03-26 ENCOUNTER — Encounter (INDEPENDENT_AMBULATORY_CARE_PROVIDER_SITE_OTHER): Payer: Self-pay | Admitting: Physical Medicine and Rehabilitation

## 2017-03-26 VITALS — BP 135/97 | HR 69 | Temp 97.8°F

## 2017-03-26 DIAGNOSIS — M4712 Other spondylosis with myelopathy, cervical region: Secondary | ICD-10-CM

## 2017-03-26 DIAGNOSIS — M5116 Intervertebral disc disorders with radiculopathy, lumbar region: Secondary | ICD-10-CM

## 2017-03-26 DIAGNOSIS — M4802 Spinal stenosis, cervical region: Secondary | ICD-10-CM | POA: Diagnosis not present

## 2017-03-26 MED ORDER — BETAMETHASONE SOD PHOS & ACET 6 (3-3) MG/ML IJ SUSP
12.0000 mg | Freq: Once | INTRAMUSCULAR | Status: AC
  Administered 2017-03-26: 12 mg

## 2017-03-26 NOTE — Patient Instructions (Signed)

## 2017-03-26 NOTE — Progress Notes (Deleted)
Pt states a constant tight pain in neck and lower back. Pt states numbness and tingling in both hands/fingers. Pt states pain has been going on for about 3 years, pt has been taking BC to ease pain. Pt states hydrocodone helps the pain, and bending down at work makes pain. +Driver, -BT, -Dye Allergies.

## 2017-03-26 NOTE — Progress Notes (Deleted)
+   Hoffmans, hyperrefflexic

## 2017-03-27 ENCOUNTER — Encounter (INDEPENDENT_AMBULATORY_CARE_PROVIDER_SITE_OTHER): Payer: Self-pay | Admitting: Physical Medicine and Rehabilitation

## 2017-03-27 NOTE — Progress Notes (Signed)
Chad Wilson - 42 y.o. male MRN [id number]  Date of birth: 02-Sep-1975  Office Visit Note: Visit Date: 03/26/2017 PCP: Dorothyann Peng, NP Referred by: Dorothyann Peng, NP  Subjective: Chief Complaint  Patient presents with  . Neck - Pain  . Lower Back - Pain  . Left Hand - Numbness, Tingling  . Right Hand - Numbness, Tingling   HPI: Chad Wilson is a 42 year old right-hand-dominant gentleman seen here today for evaluation and management Dr. Erlinda Hong.  Dr. Erlinda Hong has been following for his orthopedic complaints and neck pain and back and hip and leg pain.  The patient has had no relief with conservative care including activity modification and time.  He has had therapy in the past.  He is currently taking hydrocodone and this was bumped up from tramadol due to the fact that it was ineffective.  He reports really pain is been going on for close to 3 years but with recent worsening.  He is also tried to take [facility] powders to ease the pain.  He continues to work.  He reports tightness and pain in the neck on the right side but across the neck referring into both shoulders and down into the arms and fingers in a nondermatomal fashion.  The hands and fingers are more closely related to the first and second digit when you really try to nail down the distribution.  He does not feel like he has had any weakness in the hands.  He does a lot of physical work.  He has not had any prior cervical surgery or injections.  He has felt like it has worsened over the last 6 months.  He is not had any bowel or bladder difficulty or difficulty ambulating or balance difficulties.  He said no fevers chills or night sweats.  He has had a secondary issue of low back pain which is really centered at the low back.  Gets pain with bending forward and staying in one position too long.  He has some prolonged sitting or prolonged standing.  Dr. Erlinda Hong obtained an MRI of both the cervical and lumbar spine.  The lumbar spine MRI was fairly normal  except for mild retrolisthesis of L3 on 4 with some facet arthropathy here and otherwise fairly normal.  His cervical spine MRI however was fairly abnormal is reviewed in detail in the report below.  There is basically significant disc herniation and listhesis at the C5-6 and C6-7 extrusion contacting the cord and there is cord signal intensity this is.  There is severe stenosis.    Review of Systems  Constitutional: Negative for chills, fever, malaise/fatigue and weight loss.  HENT: Negative for hearing loss and sinus pain.   Eyes: Negative for blurred vision, double vision and photophobia.  Respiratory: Negative for cough and shortness of breath.   Cardiovascular: Negative for chest pain, palpitations and leg swelling.  Gastrointestinal: Negative for abdominal pain, nausea and vomiting.  Genitourinary: Negative for flank pain.  Musculoskeletal: Positive for back pain and neck pain. Negative for myalgias.  Skin: Negative for itching and rash.  Neurological: Positive for tingling. Negative for tremors, focal weakness and weakness.  Endo/Heme/Allergies: Negative.   Psychiatric/Behavioral: Negative for depression.  All other systems reviewed and are negative.  Otherwise per HPI.  Assessment & Plan: Visit Diagnoses:  1. Radiculopathy due to lumbar intervertebral disc disorder   2. Spinal stenosis of cervical region   3. Other spondylosis with myelopathy, cervical region     Plan: Findings:  Chronic but worsening history of the neck pain and paresthesias in both arms and hands without much in the way of perceived weakness.  He continues to work a physical job.  He does get a lot of back pain if he bends forward and bends down.  Lumbar spine MRI was really not very abnormal with some mild degenerative changes at L3-4.  Cervical MRI however shows significant disc herniation and stenosis as well as cord signal change.  Exam shows some myelopathic change with positive Hoffman's test bilaterally as  well as hyperreflexic biceps and brachioradialis.  The patient's course is complicated by the use of BC powders as well as now hydrocodone.  His course is also complicated by history of anxiety with disorder and history of alcohol abuse.  He is employed and does continue to work.  He works with a Armed forces logistics/support/administrative officer.  He is not a diabetic and is on no blood thinners.  I think the best approach here is to go ahead and provide epidural injection of the lumbar spine since his back is giving him difficulty.  I am unsure if the back pain is related to his neck and it could given the severity of the neck itself.  From his cervical spine standpoint I really do not think it is appropriate for cervical injection at this point is referral to a spine surgeon or neurosurgeon.  We will make appropriate referral.    Meds & Orders:  Meds ordered this encounter  Medications  . betamethasone acetate-betamethasone sodium phosphate (CELESTONE) injection 12 mg    Orders Placed This Encounter  Procedures  . XR C-ARM NO REPORT  . Ambulatory referral to Neurosurgery  . Epidural Steroid injection    Follow-up: Return if symptoms worsen or fail to improve.   Procedures: No procedures performed  No notes on file   Clinical History: MRI CERVICAL SPINE WITHOUT CONTRAST  TECHNIQUE: Multiplanar, multisequence MR imaging of the cervical spine was performed. No intravenous contrast was administered.  COMPARISON:  Cervical spine radiographs 12/31/2016  FINDINGS: Alignment: Grade 1 degenerative retrolisthesis CT C6-7 measures 2 mm. AP alignment is otherwise anatomic. There straightening of the normal cervical lordosis.  Vertebrae: Marrow signal and vertebral body heights are normal.  Cord: T2 hyperintensity is evident in the right lateral aspect of cord at the C6-7 level extending 8 mm cephalo caudad. Normal signal is present through the remainder of the cervical spinal cord.  Posterior Fossa, vertebral  arteries, paraspinal tissues: The craniocervical junction is normal. The visualized intracranial contents are normal. Flow is present in the vertebral arteries bilaterally.  Disc levels:  C2-3:  Negative.  C3-4: Bilateral uncovertebral spurring and mild foraminal stenosis is present. The central canal is patent.  C4-5: A mild broad-based disc osteophyte complex is present. There is partial effacement of ventral CSF. Facet hypertrophy is worse on the left. Moderate left and mild right foraminal narrowing is present.  C5-6: A large soft disc right paramedian disc extrusion contacts and distorts the spinal cord. The canal is narrowed to 5.5 mm on the right. Moderate left and mild right foraminal narrowing is secondary to a uncovertebral spurring.  C6-7: A broad-based rightward disc osteophyte complex is present. There is a significant soft disc component. This results in severe central canal narrowing. The canal is narrowed to 6.5 mm. Abnormal T2 signal is present on the right. Severe foraminal stenosis is present bilaterally, right greater than left.  C7-T1: Asymmetric moderate right-sided facet hypertrophy is present without significant stenosis.  IMPRESSION: 1. Prominent right paramedian soft disc protrusion contacts and distorts the central and right paramedian cord at the C5-6 level with severe central canal stenosis. 2. Moderate left and mild right foraminal stenosis at C5-6 secondary to uncovertebral spurring. 3. Broad-based rightward disc osteophyte complex with significant soft disc component results in severe right central canal stenosis and abnormal cord signal, likely edema. 4. Severe foraminal stenosis at C6-7 is worse on the right. 5. Mild central canal narrowing at C4-5 with moderate left and mild right foraminal stenosis. 6. Mild foraminal narrowing bilaterally at C3-4. 7. Asymmetric right-sided facet hypertrophy at C7-T1 without significant  stenosis.   Electronically Signed   By: San Morelle M.D.   On: 02/02/2017 13:46  He reports that he has been smoking.  He has a 30.00 pack-year smoking history. he has never used smokeless tobacco.  Recent Labs    10/30/16 0740  HGBA1C 5.7    Objective:  VS:  HT:    WT:   BMI:     BP:(!) 135/97  HR:69bpm  TEMP:97.8 F (36.6 C)(Oral)  RESP:99 % Physical Exam  Constitutional: He is oriented to person, place, and time. He appears well-developed and well-nourished. No distress.  HENT:  Head: Normocephalic and atraumatic.  Nose: Nose normal.  Mouth/Throat: Oropharynx is clear and moist.  Eyes: Conjunctivae are normal. Pupils are equal, round, and reactive to light.  Neck: Neck supple. No JVD present.  Cardiovascular: Regular rhythm and intact distal pulses.  Pulmonary/Chest: Effort normal and breath sounds normal.  Abdominal: Soft. He exhibits no distension.  Musculoskeletal: He exhibits no edema or deformity.  Neurological: He is alert and oriented to person, place, and time. He displays abnormal reflex.  Reflex Scores:      Tricep reflexes are 3+ on the right side and 3+ on the left side.      Bicep reflexes are 3+ on the right side and 3+ on the left side.      Brachioradialis reflexes are 3+ on the right side and 3+ on the left side. Patient positive Hoffman's right more than left.   Skin: Skin is warm. No rash noted.  Psychiatric: He has a normal mood and affect. His behavior is normal.  Nursing note and vitals reviewed.   Ortho Exam Imaging: Xr C-arm No Report  Result Date: 03/26/2017 Please see Notes or Procedures tab for imaging impression.   Past Medical/Family/Surgical/Social History: Medications & Allergies reviewed per EMR Patient Active Problem List   Diagnosis Date Noted  . Chronic midline low back pain with bilateral sciatica 12/31/2016  . Hyperthyroidism 03/20/2016  . Neck pain 09/06/2011  . Cervical radiculopathy at C6 09/06/2011  .  JAUNDICE 05/21/2010  . HEPATITIS C 11/21/2009  . Anxiety and depression 11/21/2009  . PANIC ATTACK 11/21/2009  . Alcohol abuse 11/21/2009  . TOBACCO ABUSE 11/21/2009  . HYPERTENSION, BENIGN ESSENTIAL 11/21/2009  . FATIGUE 11/21/2009   Past Medical History:  Diagnosis Date  . Acute hepatitis C without mention of hepatic coma(070.51)    has been cleared   . Alcohol abuse, unspecified   . Anxiety states   . Depression 05/27/2011  . Essential hypertension, benign   . Graves disease    treated by Dr Loanne Drilling  . Panic disorder without agoraphobia   . Thyroid disease    Graves Disease  . Tobacco use disorder    Family History  Problem Relation Age of Onset  . Anxiety disorder Father   . Heart disease Father   .  Hypertension Father   . Thyroid disease Mother   . Anxiety disorder Sister   . Hyperthyroidism Sister    History reviewed. No pertinent surgical history. Social History   Occupational History    Employer: [employer]  Tobacco Use  . Smoking status: Current Every Day Smoker    Packs/day: 1.00    Years: 30.00    Pack years: 30.00  . Smokeless tobacco: Never Used  Substance and Sexual Activity  . Alcohol use: Yes    Comment: socially   . Drug use: No    Comment: cocaine  . Sexual activity: Not on file

## 2017-04-04 ENCOUNTER — Other Ambulatory Visit (INDEPENDENT_AMBULATORY_CARE_PROVIDER_SITE_OTHER): Payer: Self-pay | Admitting: Orthopaedic Surgery

## 2017-04-04 NOTE — Telephone Encounter (Signed)
No more narcotics.  He was referred to neurosurgery

## 2017-04-04 NOTE — Telephone Encounter (Signed)
Rx request 

## 2017-04-07 ENCOUNTER — Encounter (INDEPENDENT_AMBULATORY_CARE_PROVIDER_SITE_OTHER): Payer: Self-pay | Admitting: Orthopaedic Surgery

## 2017-04-07 ENCOUNTER — Other Ambulatory Visit (INDEPENDENT_AMBULATORY_CARE_PROVIDER_SITE_OTHER): Payer: Self-pay

## 2017-04-07 MED ORDER — HYDROCODONE-ACETAMINOPHEN 5-325 MG PO TABS: 1.0000 | ORAL_TABLET | Freq: Every day | ORAL | 0 refills | Status: DC | PRN

## 2017-04-07 NOTE — Telephone Encounter (Signed)
Kathlee Nations, can you call this one?

## 2017-04-07 NOTE — Telephone Encounter (Signed)
Norco #[id number]

## 2017-04-14 ENCOUNTER — Encounter: Payer: Self-pay | Admitting: Endocrinology

## 2017-04-14 ENCOUNTER — Encounter: Payer: Self-pay | Admitting: Adult Health

## 2017-04-14 ENCOUNTER — Ambulatory Visit (INDEPENDENT_AMBULATORY_CARE_PROVIDER_SITE_OTHER): Payer: 59 | Admitting: Endocrinology

## 2017-04-14 DIAGNOSIS — E039 Hypothyroidism, unspecified: Secondary | ICD-10-CM | POA: Insufficient documentation

## 2017-04-14 DIAGNOSIS — E890 Postprocedural hypothyroidism: Secondary | ICD-10-CM | POA: Diagnosis not present

## 2017-04-14 LAB — TSH: TSH: 39.2 u[IU]/mL — ABNORMAL HIGH (ref 0.35–4.50)

## 2017-04-14 LAB — T4, FREE: Free T4: 0.51 ng/dL — ABNORMAL LOW (ref 0.60–1.60)

## 2017-04-14 MED ORDER — LEVOTHYROXINE SODIUM 125 MCG PO TABS
125.0000 ug | ORAL_TABLET | Freq: Every day | ORAL | 3 refills | Status: DC
Start: 2017-04-14 — End: 2017-06-12

## 2017-04-14 NOTE — Progress Notes (Signed)
Subjective:    Patient ID: Chad Wilson, male    DOB: 1976-01-05, 42 y.o.   MRN: [id number]  HPI Pt returns for f/u of post-RAI hypothyroidism (due to [place]; dx'ed 2017, when he was incarcerated; he took tapazole, but this was stopped for RAI, which he had in Feb, and again in Oct of 2018).  He started Synthroid in Nov of 2018.  pt states he feels well in general.  Past Medical History:  Diagnosis Date  . Acute hepatitis C without mention of hepatic coma(070.51)    has been cleared   . Alcohol abuse, unspecified   . Anxiety states   . Depression 05/27/2011  . Essential hypertension, benign   . Graves disease    treated by Dr Loanne Drilling  . Panic disorder without agoraphobia   . Thyroid disease    Graves Disease  . Tobacco use disorder     History reviewed. No pertinent surgical history.  Social History   Socioeconomic History  . Marital status: Married    Spouse name: Not on file  . Number of children: 0  . Years of education: Not on file  . Highest education level: Not on file  Social Needs  . Financial resource strain: Not on file  . Food insecurity - worry: Not on file  . Food insecurity - inability: Not on file  . Transportation needs - medical: Not on file  . Transportation needs - non-medical: Not on file  Occupational History    Employer: [employer]  Tobacco Use  . Smoking status: Current Every Day Smoker    Packs/day: 1.00    Years: 30.00    Pack years: 30.00  . Smokeless tobacco: Never Used  Substance and Sexual Activity  . Alcohol use: Yes    Comment: socially   . Drug use: No    Comment: cocaine  . Sexual activity: Not on file  Other Topics Concern  . Not on file  Social History Narrative   He is working in [employer]    Diet: Fruits and veggies, water poweraid, occ sweet tea    Current Outpatient Medications on File Prior to Visit  Medication Sig Dispense Refill  . citalopram (CELEXA) 40 MG tablet Take 1 tablet (40 mg  total) by mouth daily. 90 tablet 1  . HYDROcodone-acetaminophen (NORCO/VICODIN) 5-325 MG tablet Take 1 tablet by mouth daily as needed for moderate pain. 20 tablet 0  . sildenafil (REVATIO) 20 MG tablet TAKE 5 TABLETS BY MOUTH EVERY DAY AS NEEDED FOR ERECTILE DYSFUNCTION 50 tablet 2  . sildenafil (VIAGRA) 100 MG tablet Take 1 tablet (100 mg total) by mouth daily as needed for erectile dysfunction. 10 tablet 2  . traMADol (ULTRAM) 50 MG tablet TAKE 1 TABLET BY MOUTH TWICE DAILY AS NEEDED FOR PAIN 30 tablet 0  . [DISCONTINUED] omeprazole (PRILOSEC) 20 MG capsule Take 1 capsule (20 mg total) by mouth daily. 30 capsule 3   No current facility-administered medications on file prior to visit.     Allergies  Allergen Reactions  . Chantix [Varenicline] Other (See Comments)    Agitation and sleep walking     Family History  Problem Relation Age of Onset  . Anxiety disorder Father   . Heart disease Father   . Hypertension Father   . Thyroid disease Mother   . Anxiety disorder Sister   . Hyperthyroidism Sister     BP (!) 152/96 (BP Location: Left Arm, Patient Position: Sitting, Cuff Size: Normal)  Pulse 84   Wt 182 lb 12.8 oz (82.9 kg)   SpO2 95%   BMI 27.79 kg/m    Review of Systems He has gained weight    Objective:   Physical Exam VITAL SIGNS:  See vs page.  GENERAL: no distress.  eyes: slight bilat periorbital swelling and proptosis.   NECK: thyroid is approx twice normal size, R>L.  There is an irreg surface but no palpable nodule.   Lab Results  Component Value Date   TSH 39.20 (H) 04/14/2017      Assessment & Plan:  Post-RAI hypothyroidism: he needs increased rx.  I have sent a prescription to your pharmacy.

## 2017-04-14 NOTE — Patient Instructions (Addendum)
blood tests are requested for you today.  We'll let you know about the results. Your blood pressure is high today.  Please see your primary care provider soon, to have it rechecked.   Please come back for a follow-up appointment in 2 months.

## 2017-04-15 ENCOUNTER — Other Ambulatory Visit: Payer: Self-pay | Admitting: Adult Health

## 2017-04-15 DIAGNOSIS — Z6828 Body mass index (BMI) 28.0-28.9, adult: Secondary | ICD-10-CM | POA: Diagnosis not present

## 2017-04-15 DIAGNOSIS — M5412 Radiculopathy, cervical region: Secondary | ICD-10-CM | POA: Diagnosis not present

## 2017-04-15 DIAGNOSIS — I10 Essential (primary) hypertension: Secondary | ICD-10-CM

## 2017-04-15 DIAGNOSIS — G959 Disease of spinal cord, unspecified: Secondary | ICD-10-CM | POA: Diagnosis not present

## 2017-04-15 MED ORDER — LISINOPRIL 40 MG PO TABS: 40.0000 mg | ORAL_TABLET | Freq: Every day | ORAL | 3 refills | Status: DC

## 2017-04-16 ENCOUNTER — Telehealth: Payer: Self-pay | Admitting: Endocrinology

## 2017-04-16 ENCOUNTER — Other Ambulatory Visit: Payer: Self-pay | Admitting: Neurological Surgery

## 2017-04-16 NOTE — Telephone Encounter (Signed)
Rx printed 04/07/17 and picked up by Patient 04/08/17.

## 2017-04-16 NOTE — Telephone Encounter (Signed)
This was sent to our office by mistake, please advise.

## 2017-04-16 NOTE — Telephone Encounter (Signed)
I am not the prescriber of this med

## 2017-04-16 NOTE — Telephone Encounter (Signed)
[facility] ph# [phone] called-need script for Hydrocodone sent to them (it was sent to [facility] by [facility] [place])

## 2017-04-16 NOTE — Telephone Encounter (Signed)
Pharmacy aware. Tried to call patient to see where he filled Rx no answer.

## 2017-04-16 NOTE — Telephone Encounter (Signed)
Please advise on below  

## 2017-04-16 NOTE — Telephone Encounter (Signed)
Called pharm to see if patient ever filled Rx. We cannot fax Narcotics into pharm.

## 2017-04-28 ENCOUNTER — Institutional Professional Consult (permissible substitution) (INDEPENDENT_AMBULATORY_CARE_PROVIDER_SITE_OTHER): Payer: 59 | Admitting: Specialist

## 2017-04-28 ENCOUNTER — Other Ambulatory Visit: Payer: Self-pay | Admitting: Endocrinology

## 2017-04-28 MED FILL — SILDENAFIL 20 MG TABLET: 20 | 30 days supply | Qty: 50 | Fill #0

## 2017-04-29 ENCOUNTER — Other Ambulatory Visit: Payer: Self-pay | Admitting: *Deleted

## 2017-04-29 ENCOUNTER — Encounter: Payer: Self-pay | Admitting: *Deleted

## 2017-04-29 NOTE — Telephone Encounter (Signed)
This encounter was created in error - please disregard.

## 2017-04-29 NOTE — Patient Outreach (Addendum)
[facility]) Care Management  04/29/2017  Mali Everette Humphries 1975/05/13 [id number]   Subjective: Telephone call to patient's home / mobile number, spoke with patient, and HIPAA verified.  Discussed [facility] Care Management [facility]MR Transition of care follow up, preoperative call follow up, patient voiced understanding, and is in agreement to both types of follow up.   Patient states he is doing well currently on his lunch break at work, will be having surgery on 05/21/17 at [facility], not sure how long he will be in the hospital, or out of work.   States his wife Kashmere Staffa will be caregiver and can assist as needed with activities of daily living / home management.  Patient gave [facility] verbal consent to speak with wife Anik Wesch) regarding his healthcare needs as needed.  Patient states he is able to manage self care and has assistance as needed.  Patient voices understanding of medical diagnosis, pending surgery, and treatment plan. States he is accessing the following [employer] benefits: outpatient pharmacy, hospital indemnity (not sure, will ask wife who is [employer] employee to follow up, will file if appropriate), wife has family medical leave act (FMLA) in place to assist with patient's recovery, and patient not eligible for FMLA.  Discussed Advanced Directives, advised of [employer] Employee Spiritual Care Advanced Directives document completion benefit, patient voices understanding, and states he will ask his wife to follow up.   Patient states he does not have any preoperative questions, care coordination, disease management, disease monitoring, transportation, community resource, or pharmacy needs at this time. States he is very appreciative of the follow up and is in agreement to receive [place] Management information post transition of care follow up.      Objective: Per KPN (Knowledge Performance Now, point of care tool) and chart review, patient to be admitted 05/21/17 for possible  corpectomy (C5-6, C6-C7) at [facility].   Patient also has a history of Acute hepatitis C, Graves disease, Panic disorder without agoraphobia; Thyroid disease; Alcohol abuse, Tobacco use disorder, and hypertension.      Assessment:  Received [facility] Preoperative / Transition of care referral on 04/24/17.    Preoperative call completed, and transition of care follow up pending notification of patient discharge.     Plan: RNCM will call patient for  telephone outreach attempt, transition of care follow up, within 3 business days of hospital discharge notification.     Duell Holdren H. Annia Friendly, BSN, [place] Management [facility] Telephonic CM Phone: [phone] Fax: [phone]

## 2017-05-06 NOTE — Pre-Procedure Instructions (Signed)
Mali Everette Pickard  05/06/2017      [place], [place] - [address] [place] [address] Phone: [phone] Fax: [place], [place]. [address] [place] [address] Phone: [phone] Fax: [phone]    Your procedure is scheduled on March 6  Report to [place] at 1045 A.M.  Call this number if you have problems the morning of surgery:  [phone]   Remember:  Do not eat food or drink liquids after midnight.  Take these medicines the morning of surgery with A SIP OF WATER Citalopram (Celexa), Hydrocodone (Norco) if needed, Levothyroxine (Synthroid)   Do not wear jewelry, make-up or nail polish.  Do not wear lotions, powders, or perfumes, or deodorant.  Do not shave 48 hours prior to surgery.  Men may shave face and neck.  Do not bring valuables to the hospital.  [facility] is not responsible for any belongings or valuables.  Contacts, dentures or bridgework may not be worn into surgery.  Leave your suitcase in the car.  After surgery it may be brought to your room.  For patients admitted to the hospital, discharge time will be determined by your treatment team.  Patients discharged the day of surgery will not be allowed to drive home.   Special instructions:  [place] - Preparing for Surgery  Before surgery, you can play an important role.  Because skin is not sterile, your skin needs to be as free of germs as possible.  You can reduce the number of germs on you skin by washing with CHG (chlorahexidine gluconate) soap before surgery.  CHG is an antiseptic cleaner which kills germs and bonds with the skin to continue killing germs even after washing.  Please DO NOT use if you have an allergy to CHG or antibacterial soaps.  If your skin becomes reddened/irritated stop using the CHG and inform your  nurse when you arrive at Short Stay.  Do not shave (including legs and underarms) for at least 48 hours prior to the first CHG shower.  You may shave your face.  Please follow these instructions carefully:   1.  Shower with CHG Soap the night before surgery and the  morning of Surgery.  2.  If you choose to wash your hair, wash your hair first as usual with your  normal shampoo.  3.  After you shampoo, rinse your hair and body thoroughly to remove the Shampoo.  4.  Use CHG as you would any other liquid soap.  You can apply chg directly to the skin and wash gently with scrungie or a clean washcloth.  5.  Apply the CHG Soap to your body ONLY FROM THE NECK DOWN.   Do not use on open wounds or open sores.  Avoid contact with your eyes,  ears, mouth and genitals (private parts).  Wash genitals (private parts)       with your normal soap.  6.  Wash thoroughly, paying special attention to the area where your surgery  will be performed.  7.  Thoroughly rinse your body with warm water from the neck down.  8.  DO NOT shower/wash with your normal soap after using and rinsing off   the CHG Soap.  9.  Pat yourself dry with a clean towel.            10.  Wear clean pajamas.            11.  Place clean sheets on your bed the night of your first shower and do not sleep with pets.  Day of Surgery  Do not apply any lotions/deoderants the morning of surgery.  Please wear clean clothes to the hospital/surgery center.     Please read over the following fact sheets that you were given. Pain Booklet, Coughing and Deep Breathing, MRSA Information and Surgical Site Infection Prevention

## 2017-05-07 ENCOUNTER — Other Ambulatory Visit: Payer: Self-pay

## 2017-05-07 ENCOUNTER — Encounter (HOSPITAL_COMMUNITY)
Admission: RE | Admit: 2017-05-07 | Discharge: 2017-05-07 | Disposition: A | Discharge status: Home / Self Care | Payer: 59 | Source: Ambulatory Visit | Attending: Neurological Surgery | Admitting: Neurological Surgery

## 2017-05-07 ENCOUNTER — Encounter (HOSPITAL_COMMUNITY): Payer: Self-pay

## 2017-05-07 DIAGNOSIS — F410 Panic disorder [episodic paroxysmal anxiety] without agoraphobia: Secondary | ICD-10-CM | POA: Insufficient documentation

## 2017-05-07 DIAGNOSIS — B1710 Acute hepatitis C without hepatic coma: Secondary | ICD-10-CM | POA: Diagnosis not present

## 2017-05-07 DIAGNOSIS — M542 Cervicalgia: Secondary | ICD-10-CM | POA: Insufficient documentation

## 2017-05-07 DIAGNOSIS — F1010 Alcohol abuse, uncomplicated: Secondary | ICD-10-CM | POA: Insufficient documentation

## 2017-05-07 DIAGNOSIS — R17 Unspecified jaundice: Secondary | ICD-10-CM | POA: Insufficient documentation

## 2017-05-07 DIAGNOSIS — M5412 Radiculopathy, cervical region: Secondary | ICD-10-CM | POA: Insufficient documentation

## 2017-05-07 DIAGNOSIS — I10 Essential (primary) hypertension: Secondary | ICD-10-CM | POA: Insufficient documentation

## 2017-05-07 DIAGNOSIS — F17200 Nicotine dependence, unspecified, uncomplicated: Secondary | ICD-10-CM | POA: Insufficient documentation

## 2017-05-07 DIAGNOSIS — F329 Major depressive disorder, single episode, unspecified: Secondary | ICD-10-CM | POA: Diagnosis not present

## 2017-05-07 DIAGNOSIS — E039 Hypothyroidism, unspecified: Secondary | ICD-10-CM | POA: Diagnosis not present

## 2017-05-07 DIAGNOSIS — F419 Anxiety disorder, unspecified: Secondary | ICD-10-CM | POA: Insufficient documentation

## 2017-05-07 DIAGNOSIS — Z01812 Encounter for preprocedural laboratory examination: Secondary | ICD-10-CM | POA: Diagnosis not present

## 2017-05-07 DIAGNOSIS — R5383 Other fatigue: Secondary | ICD-10-CM | POA: Insufficient documentation

## 2017-05-07 DIAGNOSIS — R5381 Other malaise: Secondary | ICD-10-CM | POA: Diagnosis not present

## 2017-05-07 LAB — COMPREHENSIVE METABOLIC PANEL
ALK PHOS: 71 U/L (ref 38–126)
ALT: 27 U/L (ref 17–63)
ANION GAP: 10 (ref 5–15)
AST: 22 U/L (ref 15–41)
Albumin: 4.1 g/dL (ref 3.5–5.0)
BUN: 11 mg/dL (ref 6–20)
CO2: 26 mmol/L (ref 22–32)
Calcium: 9.4 mg/dL (ref 8.9–10.3)
Chloride: 103 mmol/L (ref 101–111)
Creatinine, Ser: 0.83 mg/dL (ref 0.61–1.24)
GFR calc Af Amer: >60 mL/min (ref >60)
GLUCOSE: 113 mg/dL — AB (ref 65–99)
Potassium: 4.5 mmol/L (ref 3.5–5.1)
SODIUM: 139 mmol/L (ref 135–145)
Total Bilirubin: 0.9 mg/dL (ref 0.3–1.2)
Total Protein: 7.2 g/dL (ref 6.5–8.1)

## 2017-05-07 LAB — CBC WITH DIFFERENTIAL/PLATELET
BASOS ABS: 0.1 10*3/uL (ref 0.0–0.1)
Basophils Relative: 1 %
EOS PCT: 2 %
Eosinophils Absolute: 0.2 10*3/uL (ref 0.0–0.7)
HCT: 42.5 % (ref 39.0–52.0)
Hemoglobin: 14.8 g/dL (ref 13.0–17.0)
LYMPHS ABS: 2.4 10*3/uL (ref 0.7–4.0)
Lymphocytes Relative: 28 %
MCH: 33.9 pg (ref 26.0–34.0)
MCHC: 34.8 g/dL (ref 30.0–36.0)
MCV: 97.3 fL (ref 78.0–100.0)
MONO ABS: 1 10*3/uL (ref 0.1–1.0)
Monocytes Relative: 12 %
NEUTROS ABS: 5 10*3/uL (ref 1.7–7.7)
Neutrophils Relative %: 57 %
PLATELETS: 240 10*3/uL (ref 150–400)
RBC: 4.37 MIL/uL (ref 4.22–5.81)
RDW: 14.3 % (ref 11.5–15.5)
WBC: 8.7 10*3/uL (ref 4.0–10.5)

## 2017-05-07 LAB — SURGICAL PCR SCREEN
MRSA, PCR: NEGATIVE
STAPHYLOCOCCUS AUREUS: NEGATIVE

## 2017-05-07 LAB — PROTIME-INR
INR: 0.99
Prothrombin Time: 13 seconds (ref 11.4–15.2)

## 2017-05-07 NOTE — Progress Notes (Signed)
PCP: Dorothyann Peng [facility] [facility]  No cardiologist

## 2017-05-12 ENCOUNTER — Other Ambulatory Visit: Payer: Self-pay | Admitting: Endocrinology

## 2017-05-12 ENCOUNTER — Telehealth: Payer: Self-pay | Admitting: Endocrinology

## 2017-05-13 ENCOUNTER — Telehealth: Payer: Self-pay | Admitting: Endocrinology

## 2017-05-13 NOTE — Telephone Encounter (Signed)
Patient need refill of Viagera

## 2017-05-21 ENCOUNTER — Encounter (HOSPITAL_COMMUNITY)
Admission: RE | Disposition: A | Discharge status: Home / Self Care | Payer: Self-pay | Source: Ambulatory Visit | Attending: Neurological Surgery

## 2017-05-21 ENCOUNTER — Inpatient Hospital Stay (HOSPITAL_COMMUNITY)
Admission: RE | Admit: 2017-05-21 | Discharge: 2017-05-22 | DRG: 472 | Disposition: A | Discharge status: Home / Self Care | Payer: 59 | Source: Ambulatory Visit | Attending: Neurological Surgery | Admitting: Neurological Surgery

## 2017-05-21 ENCOUNTER — Inpatient Hospital Stay (HOSPITAL_COMMUNITY): Payer: 59

## 2017-05-21 ENCOUNTER — Inpatient Hospital Stay (HOSPITAL_COMMUNITY): Payer: 59 | Admitting: Certified Registered Nurse Anesthetist

## 2017-05-21 ENCOUNTER — Encounter (HOSPITAL_COMMUNITY): Payer: Self-pay

## 2017-05-21 DIAGNOSIS — M50023 Cervical disc disorder at C6-C7 level with myelopathy: Secondary | ICD-10-CM | POA: Diagnosis present

## 2017-05-21 DIAGNOSIS — Z79899 Other long term (current) drug therapy: Secondary | ICD-10-CM | POA: Diagnosis not present

## 2017-05-21 DIAGNOSIS — M4802 Spinal stenosis, cervical region: Secondary | ICD-10-CM | POA: Diagnosis not present

## 2017-05-21 DIAGNOSIS — M47812 Spondylosis without myelopathy or radiculopathy, cervical region: Secondary | ICD-10-CM | POA: Diagnosis not present

## 2017-05-21 DIAGNOSIS — F17210 Nicotine dependence, cigarettes, uncomplicated: Secondary | ICD-10-CM | POA: Diagnosis present

## 2017-05-21 DIAGNOSIS — M50122 Cervical disc disorder at C5-C6 level with radiculopathy: Secondary | ICD-10-CM | POA: Diagnosis not present

## 2017-05-21 DIAGNOSIS — M542 Cervicalgia: Secondary | ICD-10-CM | POA: Diagnosis present

## 2017-05-21 DIAGNOSIS — I10 Essential (primary) hypertension: Secondary | ICD-10-CM | POA: Diagnosis present

## 2017-05-21 DIAGNOSIS — F419 Anxiety disorder, unspecified: Secondary | ICD-10-CM | POA: Diagnosis present

## 2017-05-21 DIAGNOSIS — M4712 Other spondylosis with myelopathy, cervical region: Secondary | ICD-10-CM | POA: Diagnosis not present

## 2017-05-21 DIAGNOSIS — E0500 Thyrotoxicosis with diffuse goiter without thyrotoxic crisis or storm: Secondary | ICD-10-CM | POA: Diagnosis present

## 2017-05-21 DIAGNOSIS — Z419 Encounter for procedure for purposes other than remedying health state, unspecified: Secondary | ICD-10-CM

## 2017-05-21 DIAGNOSIS — E039 Hypothyroidism, unspecified: Secondary | ICD-10-CM | POA: Diagnosis present

## 2017-05-21 DIAGNOSIS — Z9889 Other specified postprocedural states: Secondary | ICD-10-CM

## 2017-05-21 DIAGNOSIS — Z981 Arthrodesis status: Secondary | ICD-10-CM | POA: Diagnosis not present

## 2017-05-21 DIAGNOSIS — F1010 Alcohol abuse, uncomplicated: Secondary | ICD-10-CM | POA: Diagnosis present

## 2017-05-21 DIAGNOSIS — B1710 Acute hepatitis C without hepatic coma: Secondary | ICD-10-CM | POA: Diagnosis present

## 2017-05-21 DIAGNOSIS — M50022 Cervical disc disorder at C5-C6 level with myelopathy: Secondary | ICD-10-CM | POA: Diagnosis present

## 2017-05-21 DIAGNOSIS — M50123 Cervical disc disorder at C6-C7 level with radiculopathy: Secondary | ICD-10-CM | POA: Diagnosis not present

## 2017-05-21 HISTORY — PX: ANTERIOR CERVICAL DECOMP/DISCECTOMY FUSION: SHX1161

## 2017-05-21 SURGERY — ANTERIOR CERVICAL DECOMPRESSION/DISCECTOMY FUSION 2 LEVELS
Anesthesia: General | Site: Neck

## 2017-05-21 MED ORDER — MENTHOL 3 MG MT LOZG: 1.0000 | LOZENGE | OROMUCOSAL | Status: DC | PRN

## 2017-05-21 MED ORDER — SODIUM CHLORIDE 0.9 % IR SOLN
Status: DC | PRN
  Administered 2017-05-21: 14:00:00

## 2017-05-21 MED ORDER — PROPOFOL 10 MG/ML IV BOLUS
INTRAVENOUS | Status: AC
  Filled 2017-05-21: qty 20

## 2017-05-21 MED ORDER — DEXAMETHASONE SODIUM PHOSPHATE 10 MG/ML IJ SOLN
INTRAMUSCULAR | Status: AC
  Filled 2017-05-21: qty 1

## 2017-05-21 MED ORDER — CEFAZOLIN SODIUM-DEXTROSE 2-4 GM/100ML-% IV SOLN
2.0000 g | INTRAVENOUS | Status: AC
  Administered 2017-05-21: 2 g via INTRAVENOUS
  Filled 2017-05-21: qty 100

## 2017-05-21 MED ORDER — METHOCARBAMOL 500 MG PO TABS
500.0000 mg | ORAL_TABLET | Freq: Four times a day (QID) | ORAL | Status: DC | PRN
  Administered 2017-05-21 – 2017-05-22 (×2): 500 mg via ORAL
  Filled 2017-05-21 (×2): qty 1

## 2017-05-21 MED ORDER — PHENOL 1.4 % MT LIQD: 1.0000 | OROMUCOSAL | Status: DC | PRN

## 2017-05-21 MED ORDER — MIDAZOLAM HCL 2 MG/2ML IJ SOLN
INTRAMUSCULAR | Status: DC | PRN
  Administered 2017-05-21: 2 mg via INTRAVENOUS

## 2017-05-21 MED ORDER — FENTANYL CITRATE (PF) 250 MCG/5ML IJ SOLN
INTRAMUSCULAR | Status: DC | PRN
  Administered 2017-05-21: 50 ug via INTRAVENOUS
  Administered 2017-05-21: 100 ug via INTRAVENOUS
  Administered 2017-05-21 (×2): 50 ug via INTRAVENOUS

## 2017-05-21 MED ORDER — HYDROMORPHONE HCL 1 MG/ML IJ SOLN
0.2500 mg | INTRAMUSCULAR | Status: DC | PRN
  Administered 2017-05-21 (×2): 0.5 mg via INTRAVENOUS

## 2017-05-21 MED ORDER — ACETAMINOPHEN 650 MG RE SUPP: 650.0000 mg | RECTAL | Status: DC | PRN

## 2017-05-21 MED ORDER — FENTANYL CITRATE (PF) 250 MCG/5ML IJ SOLN
INTRAMUSCULAR | Status: AC
  Filled 2017-05-21: qty 5

## 2017-05-21 MED ORDER — LACTATED RINGERS IV SOLN
INTRAVENOUS | Status: DC
  Administered 2017-05-21: 11:00:00 via INTRAVENOUS

## 2017-05-21 MED ORDER — SUGAMMADEX SODIUM 200 MG/2ML IV SOLN
INTRAVENOUS | Status: DC | PRN
  Administered 2017-05-21: 200 mg via INTRAVENOUS

## 2017-05-21 MED ORDER — SENNA 8.6 MG PO TABS
1.0000 | ORAL_TABLET | Freq: Two times a day (BID) | ORAL | Status: DC
  Administered 2017-05-21: 8.6 mg via ORAL
  Filled 2017-05-21: qty 1

## 2017-05-21 MED ORDER — LIDOCAINE 2% (20 MG/ML) 5 ML SYRINGE
INTRAMUSCULAR | Status: DC | PRN
  Administered 2017-05-21: 60 mg via INTRAVENOUS

## 2017-05-21 MED ORDER — MEPERIDINE HCL 50 MG/ML IJ SOLN: 6.2500 mg | INTRAMUSCULAR | Status: DC | PRN

## 2017-05-21 MED ORDER — SODIUM CHLORIDE 0.9% FLUSH: 3.0000 mL | INTRAVENOUS | Status: DC | PRN

## 2017-05-21 MED ORDER — DEXAMETHASONE SODIUM PHOSPHATE 10 MG/ML IJ SOLN
10.0000 mg | INTRAMUSCULAR | Status: AC
  Administered 2017-05-21: 10 mg via INTRAVENOUS
  Filled 2017-05-21: qty 1

## 2017-05-21 MED ORDER — THROMBIN (RECOMBINANT) 5000 UNITS EX SOLR
CUTANEOUS | Status: AC
  Filled 2017-05-21: qty 15000

## 2017-05-21 MED ORDER — BUPIVACAINE HCL (PF) 0.25 % IJ SOLN
INTRAMUSCULAR | Status: AC
  Filled 2017-05-21: qty 30

## 2017-05-21 MED ORDER — MORPHINE SULFATE (PF) 4 MG/ML IV SOLN: 1.0000 mg | INTRAVENOUS | Status: DC | PRN

## 2017-05-21 MED ORDER — CHLORHEXIDINE GLUCONATE CLOTH 2 % EX PADS: 6.0000 | MEDICATED_PAD | Freq: Once | CUTANEOUS | Status: DC

## 2017-05-21 MED ORDER — LEVOTHYROXINE SODIUM 100 MCG PO TABS: 125.0000 ug | ORAL_TABLET | Freq: Every day | ORAL | Status: DC

## 2017-05-21 MED ORDER — ROCURONIUM BROMIDE 10 MG/ML (PF) SYRINGE
PREFILLED_SYRINGE | INTRAVENOUS | Status: DC | PRN
  Administered 2017-05-21: 40 mg via INTRAVENOUS
  Administered 2017-05-21: 20 mg via INTRAVENOUS
  Administered 2017-05-21 (×2): 10 mg via INTRAVENOUS
  Administered 2017-05-21: 20 mg via INTRAVENOUS

## 2017-05-21 MED ORDER — ONDANSETRON HCL 4 MG/2ML IJ SOLN: 4.0000 mg | Freq: Once | INTRAMUSCULAR | Status: DC | PRN

## 2017-05-21 MED ORDER — ACETAMINOPHEN 325 MG PO TABS: 650.0000 mg | ORAL_TABLET | ORAL | Status: DC | PRN

## 2017-05-21 MED ORDER — BUPIVACAINE HCL (PF) 0.25 % IJ SOLN
INTRAMUSCULAR | Status: DC | PRN
  Administered 2017-05-21: 4 mL

## 2017-05-21 MED ORDER — SODIUM CHLORIDE 0.9 % IV SOLN: 250.0000 mL | INTRAVENOUS | Status: DC

## 2017-05-21 MED ORDER — SODIUM CHLORIDE 0.9% FLUSH
3.0000 mL | Freq: Two times a day (BID) | INTRAVENOUS | Status: DC
  Administered 2017-05-21: 3 mL via INTRAVENOUS

## 2017-05-21 MED ORDER — 0.9 % SODIUM CHLORIDE (POUR BTL) OPTIME
TOPICAL | Status: DC | PRN
  Administered 2017-05-21: 1000 mL

## 2017-05-21 MED ORDER — LISINOPRIL 20 MG PO TABS: 40.0000 mg | ORAL_TABLET | Freq: Every day | ORAL | Status: DC

## 2017-05-21 MED ORDER — ONDANSETRON HCL 4 MG PO TABS: 4.0000 mg | ORAL_TABLET | Freq: Four times a day (QID) | ORAL | Status: DC | PRN

## 2017-05-21 MED ORDER — HYDROCODONE-ACETAMINOPHEN 5-325 MG PO TABS
1.0000 | ORAL_TABLET | ORAL | Status: DC | PRN
  Administered 2017-05-21 (×2): 1 via ORAL
  Filled 2017-05-21 (×2): qty 1

## 2017-05-21 MED ORDER — GELATIN ABSORBABLE MT POWD
OROMUCOSAL | Status: DC | PRN
  Administered 2017-05-21: 14:00:00 via TOPICAL

## 2017-05-21 MED ORDER — POTASSIUM CHLORIDE IN NACL 20-0.9 MEQ/L-% IV SOLN: INTRAVENOUS | Status: DC

## 2017-05-21 MED ORDER — CITALOPRAM HYDROBROMIDE 40 MG PO TABS
40.0000 mg | ORAL_TABLET | Freq: Every day | ORAL | Status: DC
  Filled 2017-05-21: qty 1

## 2017-05-21 MED ORDER — EPHEDRINE SULFATE-NACL 50-0.9 MG/10ML-% IV SOSY
PREFILLED_SYRINGE | INTRAVENOUS | Status: DC | PRN
  Administered 2017-05-21: 10 mg via INTRAVENOUS

## 2017-05-21 MED ORDER — ONDANSETRON HCL 4 MG/2ML IJ SOLN
INTRAMUSCULAR | Status: DC | PRN
  Administered 2017-05-21: 4 mg via INTRAVENOUS

## 2017-05-21 MED ORDER — METHOCARBAMOL 1000 MG/10ML IJ SOLN
500.0000 mg | Freq: Four times a day (QID) | INTRAVENOUS | Status: DC | PRN
  Filled 2017-05-21: qty 5

## 2017-05-21 MED ORDER — EPHEDRINE 5 MG/ML INJ
INTRAVENOUS | Status: AC
  Filled 2017-05-21: qty 20

## 2017-05-21 MED ORDER — SUGAMMADEX SODIUM 200 MG/2ML IV SOLN
INTRAVENOUS | Status: AC
  Filled 2017-05-21: qty 4

## 2017-05-21 MED ORDER — CEFAZOLIN SODIUM-DEXTROSE 2-4 GM/100ML-% IV SOLN
2.0000 g | Freq: Three times a day (TID) | INTRAVENOUS | Status: AC
  Administered 2017-05-21 – 2017-05-22 (×2): 2 g via INTRAVENOUS
  Filled 2017-05-21 (×2): qty 100

## 2017-05-21 MED ORDER — ONDANSETRON HCL 4 MG/2ML IJ SOLN
INTRAMUSCULAR | Status: AC
  Filled 2017-05-21: qty 2

## 2017-05-21 MED ORDER — HYDROMORPHONE HCL 1 MG/ML IJ SOLN
INTRAMUSCULAR | Status: AC
  Filled 2017-05-21: qty 1

## 2017-05-21 MED ORDER — PHENYLEPHRINE HCL 10 MG/ML IJ SOLN
INTRAMUSCULAR | Status: DC | PRN
  Administered 2017-05-21: 20 ug/min via INTRAVENOUS

## 2017-05-21 MED ORDER — ONDANSETRON HCL 4 MG/2ML IJ SOLN: 4.0000 mg | Freq: Four times a day (QID) | INTRAMUSCULAR | Status: DC | PRN

## 2017-05-21 MED ORDER — PROPOFOL 10 MG/ML IV BOLUS
INTRAVENOUS | Status: DC | PRN
  Administered 2017-05-21: 150 mg via INTRAVENOUS

## 2017-05-21 MED ORDER — ROCURONIUM BROMIDE 10 MG/ML (PF) SYRINGE
PREFILLED_SYRINGE | INTRAVENOUS | Status: AC
  Filled 2017-05-21: qty 5

## 2017-05-21 MED ORDER — MIDAZOLAM HCL 2 MG/2ML IJ SOLN
INTRAMUSCULAR | Status: AC
  Filled 2017-05-21: qty 2

## 2017-05-21 SURGICAL SUPPLY — 54 items
BAG DECANTER FOR FLEXI CONT (MISCELLANEOUS) ×3 IMPLANT
BASKET BONE COLLECTION (BASKET) ×3 IMPLANT
BENZOIN TINCTURE PRP APPL 2/3 (GAUZE/BANDAGES/DRESSINGS) ×3 IMPLANT
BIT DRILL 14MM (INSTRUMENTS) ×1 IMPLANT
BUR MATCHSTICK NEURO 3.0 LAGG (BURR) ×3 IMPLANT
CANISTER SUCT 3000ML PPV (MISCELLANEOUS) ×3 IMPLANT
CARTRIDGE OIL MAESTRO DRILL (MISCELLANEOUS) ×1 IMPLANT
CLOSURE WOUND 1/2 X4 (GAUZE/BANDAGES/DRESSINGS) ×1
DIFFUSER DRILL AIR PNEUMATIC (MISCELLANEOUS) ×3 IMPLANT
DRAPE C-ARM 42X72 X-RAY (DRAPES) ×6 IMPLANT
DRAPE LAPAROTOMY 100X72 PEDS (DRAPES) ×3 IMPLANT
DRAPE MICROSCOPE LEICA (MISCELLANEOUS) ×3 IMPLANT
DRAPE POUCH INSTRU U-SHP 10X18 (DRAPES) IMPLANT
DRILL 14MM (INSTRUMENTS) ×3
DRSG OPSITE POSTOP 3X4 (GAUZE/BANDAGES/DRESSINGS) ×3 IMPLANT
DURAPREP 6ML APPLICATOR 50/CS (WOUND CARE) ×3 IMPLANT
ELECT COATED BLADE 2.86 ST (ELECTRODE) ×3 IMPLANT
ELECT REM PT RETURN 9FT ADLT (ELECTROSURGICAL) ×3
ELECTRODE REM PT RTRN 9FT ADLT (ELECTROSURGICAL) ×1 IMPLANT
GAUZE SPONGE 4X4 16PLY XRAY LF (GAUZE/BANDAGES/DRESSINGS) ×3 IMPLANT
GLOVE BIO SURGEON STRL SZ7 (GLOVE) ×3 IMPLANT
GLOVE BIO SURGEON STRL SZ8 (GLOVE) ×3 IMPLANT
GLOVE BIOGEL PI IND STRL 7.0 (GLOVE) ×1 IMPLANT
GLOVE BIOGEL PI IND STRL 8 (GLOVE) ×4 IMPLANT
GLOVE BIOGEL PI INDICATOR 7.0 (GLOVE) ×2
GLOVE BIOGEL PI INDICATOR 8 (GLOVE) ×8
GLOVE ECLIPSE 6.5 STRL STRAW (GLOVE) ×3 IMPLANT
GLOVE ECLIPSE 7.5 STRL STRAW (GLOVE) ×6 IMPLANT
GOWN STRL REUS W/ TWL LRG LVL3 (GOWN DISPOSABLE) ×2 IMPLANT
GOWN STRL REUS W/ TWL XL LVL3 (GOWN DISPOSABLE) ×1 IMPLANT
GOWN STRL REUS W/TWL 2XL LVL3 (GOWN DISPOSABLE) ×6 IMPLANT
GOWN STRL REUS W/TWL LRG LVL3 (GOWN DISPOSABLE) ×4
GOWN STRL REUS W/TWL XL LVL3 (GOWN DISPOSABLE) ×2
HEMOSTAT POWDER KIT SURGIFOAM (HEMOSTASIS) ×3 IMPLANT
KIT BASIN OR (CUSTOM PROCEDURE TRAY) ×3 IMPLANT
KIT ROOM TURNOVER OR (KITS) ×3 IMPLANT
NEEDLE HYPO 25X1 1.5 SAFETY (NEEDLE) ×3 IMPLANT
NEEDLE SPNL 20GX3.5 QUINCKE YW (NEEDLE) ×3 IMPLANT
NS IRRIG 1000ML POUR BTL (IV SOLUTION) ×3 IMPLANT
OIL CARTRIDGE MAESTRO DRILL (MISCELLANEOUS) ×3
PACK LAMINECTOMY NEURO (CUSTOM PROCEDURE TRAY) ×3 IMPLANT
PAD ARMBOARD 7.5X6 YLW CONV (MISCELLANEOUS) ×6 IMPLANT
PIN DISTRACTION 14MM (PIN) ×6 IMPLANT
PLATE 30MM (Plate) ×3 IMPLANT
RUBBERBAND STERILE (MISCELLANEOUS) ×6 IMPLANT
SCREW 14MM (Screw) ×18 IMPLANT
SPACER PTI-C 8X16X14 7DEG (Spacer) ×6 IMPLANT
SPONGE INTESTINAL PEANUT (DISPOSABLE) ×3 IMPLANT
SPONGE SURGIFOAM ABS GEL SZ50 (HEMOSTASIS) IMPLANT
STRIP CLOSURE SKIN 1/2X4 (GAUZE/BANDAGES/DRESSINGS) ×2 IMPLANT
SUT VIC AB 3-0 SH 8-18 (SUTURE) ×6 IMPLANT
TOWEL GREEN STERILE (TOWEL DISPOSABLE) ×3 IMPLANT
TOWEL GREEN STERILE FF (TOWEL DISPOSABLE) ×3 IMPLANT
WATER STERILE IRR 1000ML POUR (IV SOLUTION) ×3 IMPLANT

## 2017-05-21 NOTE — Op Note (Signed)
05/21/2017  3:30 PM  PATIENT:  Chad Wilson  42 y.o. 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 Decompressive anterior cervical discectomy C5-6 and C6-7, 2. Anterior cervical arthrodesis C5-6 and C6-7 utilizing a PTi interbody cage packed with locally harvested morcellized autologous bone graft, 3. Anterior cervical plating C5-6 and C6-7 utilizing a Alphatec plate  SURGEON:  Sherley Bounds, MD  ASSISTANTS: Meyran FNP  ANESTHESIA:   General  EBL: 50 ml  Total I/O In: 800 [I.V.:800] Out: 50 [Blood:50]  BLOOD ADMINISTERED: none  DRAINS: none  SPECIMEN:  none  INDICATION FOR PROCEDURE: This patient presented with numbness and tingling in the hands would change in gait, neck pain with radiculopathy in the right arm. Imaging showed large cervical disc herniations at C5-6 and C6-7 with signal change in the cord and severe spinal stenosis. The patient tried conservative measures without relief. Pain was debilitating. Recommended ACDF with plating. Patient understood the risks, benefits, and alternatives and potential outcomes and wished to proceed.  PROCEDURE DETAILS: Patient was brought to the operating room placed under general endotracheal anesthesia. Patient was placed in the supine position on the operating room table. The neck was prepped with Duraprep and draped in a sterile fashion.   Three cc of local anesthesia was injected and a transverse incision was made on the right side of the neck.  Dissection was carried down thru the subcutaneous tissue and the platysma was  elevated, opened, and undermined with Metzenbaum scissors.  Dissection was then carried out thru an avascular plane leaving the sternocleidomastoid carotid artery and jugular vein laterally and the trachea and esophagus medially. The ventral  aspect of the vertebral column was identified and a localizing x-ray was taken. The C5-6 level was identified. The longus colli muscles were then elevated and the retractor was placed to expose C5-6 and C6-7. The annulus was incised and the disc space entered at both levels. The exact same decompression was performed at both levels. Discectomy was performed with micro-curettes and pituitary rongeurs. I then used the high-speed drill to drill the endplates down to the level of the posterior longitudinal ligament. The drill shavings were saved in a mucous trap for later arthrodesis. The operating microscope was draped and brought into the field provided additional magnification, illumination and visualization. Discectomy was continued posteriorly thru the disc space. Posterior longitudinal ligament was opened with a nerve hook, and then removed along with disc herniation and osteophytes, decompressing the spinal canal and thecal sac. There is a large cervical disc herniation at both levels but it was larger C5-C6 paracentral to the right. It was causing significant thecal sac compression. We then continued to remove osteophytic overgrowth and disc material decompressing the neural foramina and exiting nerve roots bilaterally. The scope was angled up and down to help decompress and undercut the vertebral bodies. I spent considerable time trying to undercut the vertebral body of C6 from both the superior and the inferior endplates. Once the decompression was completed we could pass a nerve hook circumferentially to assure adequate decompression in the midline and in the neural foramina. So by both visualization and palpation we felt we had an adequate decompression of the neural elements. We then measured the height of the intravertebral disc space and selected a 8 millimeter PTi interbody cage packed with autograft. It was then gently positioned in the  intravertebral disc space(s) and countersunk. I then used a  Alphatec plate and placed variable angle screws into the vertebral bodies of each level and locked them into position. The wound was irrigated with bacitracin solution, checked for hemostasis which was established and confirmed. Once meticulous hemostasis was achieved, we then proceeded with closure. The platysma was closed with interrupted 3-0 undyed Vicryl suture, the subcuticular layer was closed with interrupted 3-0 undyed Vicryl suture. The skin edges were approximated with steristrips. The drapes were removed. A sterile dressing was applied. The patient was then awakened from general anesthesia and transferred to the recovery room in stable condition. At the end of the procedure all sponge, needle and instrument counts were correct.   PLAN OF CARE: Admit to inpatient   PATIENT DISPOSITION:  PACU - hemodynamically stable.   Delay start of Pharmacological VTE agent (>24hrs) due to surgical blood loss or risk of bleeding:  yes

## 2017-05-21 NOTE — H&P (Signed)
Subjective:   Patient is a 42 y.o. male admitted for cervical spondylitic myelopathy. The patient first presented to me with complaints of neck pain, arm pain and numbness of the arm(s). Onset of symptoms was several months ago. The pain is described as aching and occurs all day. The pain is rated severe, and is located in the neck and radiates to the arms. The symptoms have been progressive. Symptoms are exacerbated by extending head backwards, and are relieved by none.  Previous work up includes MRI of cervical spine, results: spinal stenosis.  Past Medical History:  Diagnosis Date  . Acute hepatitis C without mention of hepatic coma(070.51)    has been cleared   . Alcohol abuse, unspecified   . Anxiety states   . Essential hypertension, benign   . Graves disease    treated by Dr Loanne Drilling  . Panic disorder without agoraphobia   . Thyroid disease    Graves Disease  . Tobacco use disorder     Past Surgical History:  Procedure Laterality Date  . MULTIPLE TOOTH EXTRACTIONS  15 yrs. ago    Allergies  Allergen Reactions  . Chantix [Varenicline] Other (See Comments)    Agitation and sleep walking     Social History   Tobacco Use  . Smoking status: Current Every Day Smoker    Packs/day: 1.00    Years: 30.00    Pack years: 30.00  . Smokeless tobacco: Current User    Types: Chew  Substance Use Topics  . Alcohol use: Yes    Comment: socially     Family History  Problem Relation Age of Onset  . Anxiety disorder Father   . Heart disease Father   . Hypertension Father   . Thyroid disease Mother   . Anxiety disorder Sister   . Hyperthyroidism Sister    Prior to Admission medications   Medication Sig Start Date End Date Taking? Authorizing Provider  citalopram (CELEXA) 40 MG tablet Take 1 tablet (40 mg total) by mouth daily. 12/19/16  Yes Nafziger, Tommi Rumps, NP  HYDROcodone-acetaminophen (NORCO/VICODIN) 5-325 MG tablet Take 1 tablet by mouth daily as needed for moderate  pain. Patient taking differently: Take 1 tablet by mouth every 6 (six) hours as needed for moderate pain.  04/07/17  Yes Leandrew Koyanagi, MD  levothyroxine (SYNTHROID, LEVOTHROID) 125 MCG tablet Take 1 tablet (125 mcg total) by mouth daily. 04/14/17  Yes Renato Shin, MD  lisinopril (PRINIVIL,ZESTRIL) 40 MG tablet Take 1 tablet (40 mg total) by mouth daily. 04/15/17  Yes Nafziger, Tommi Rumps, NP  sildenafil (REVATIO) 20 MG tablet TAKE 5 TABLETS BY MOUTH EVERY DAY AS NEEDED FOR ERECTILE DYSFUNCTION 05/14/17  Yes Renato Shin, MD  omeprazole (PRILOSEC) 20 MG capsule Take 1 capsule (20 mg total) by mouth daily. 12/24/10 05/27/11  Owens Loffler, MD     Review of Systems  Positive ROS: neg  All other systems have been reviewed and were otherwise negative with the exception of those mentioned in the HPI and as above.  Objective: Vital signs in last 24 hours: Temp:  [97.9 F (36.6 C)] 97.9 F (36.6 C) (03/06 1050) Pulse Rate:  [69] 69 (03/06 1050) Resp:  [18] 18 (03/06 1050) BP: (141)/(91) 141/91 (03/06 1050) SpO2:  [99 %] 99 % (03/06 1050) Weight:  [84.7 kg (186 lb 11.2 oz)] 84.7 kg (186 lb 11.2 oz) (03/06 1058)  General Appearance: Alert, cooperative, no distress, appears stated age Head: Normocephalic, without obvious abnormality, atraumatic Eyes: PERRL, conjunctiva/corneas clear, EOM's intact  Neck: Supple, symmetrical, trachea midline, Back: Symmetric, no curvature, ROM normal, no CVA tenderness Lungs:  respirations unlabored Heart: Regular rate and rhythm Abdomen: Soft, non-tender Extremities: Extremities normal, atraumatic, no cyanosis or edema Pulses: 2+ and symmetric all extremities Skin: Skin color, texture, turgor normal, no rashes or lesions  NEUROLOGIC:  Mental status: Alert and oriented x4, no aphasia, good attention span, fund of knowledge and memory  Motor Exam - grossly normal except for some weakness of the hand grips Sensory Exam - grossly normal Reflexes:  Increased Coordination - grossly normal Gait - spastic Balance - grossly normal Cranial Nerves: I: smell Not tested  II: visual acuity  OS: nl    OD: nl  II: visual fields Full to confrontation  II: pupils Equal, round, reactive to light  III,VII: ptosis None  III,IV,VI: extraocular muscles  Full ROM  V: mastication Normal  V: facial light touch sensation  Normal  V,VII: corneal reflex  Present  VII: facial muscle function - upper  Normal  VII: facial muscle function - lower Normal  VIII: hearing Not tested  IX: soft palate elevation  Normal  IX,X: gag reflex Present  XI: trapezius strength  5/5  XI: sternocleidomastoid strength 5/5  XI: neck flexion strength  5/5  XII: tongue strength  Normal    Data Review Lab Results  Component Value Date   WBC 8.7 05/07/2017   HGB 14.8 05/07/2017   HCT 42.5 05/07/2017   MCV 97.3 05/07/2017   PLT 240 05/07/2017   Lab Results  Component Value Date   NA 139 05/07/2017   K 4.5 05/07/2017   CL 103 05/07/2017   CO2 26 05/07/2017   BUN 11 05/07/2017   CREATININE 0.83 05/07/2017   GLUCOSE 113 (H) 05/07/2017   Lab Results  Component Value Date   INR 0.99 05/07/2017    Assessment:   Cervical neck pain with herniated nucleus pulposus/ spondylosis/ stenosis at C5-6 and C6-7. Estimated body mass index is 29.24 kg/m as calculated from the following:   Height as of this encounter: 5' 7"  (1.702 m).   Weight as of this encounter: 84.7 kg (186 lb 11.2 oz).  Patient has failed conservative therapy. Planned surgery : ACDF C5-6 and C6-7, possible C6 corpectomy if necessary, plating C5-C7  Plan:   I explained the condition and procedure to the patient and answered any questions.  Patient wishes to proceed with procedure as planned. Understands risks/ benefits/ and expected or typical outcomes.  Reiner Loewen S 05/21/2017 12:21 PM

## 2017-05-21 NOTE — Anesthesia Preprocedure Evaluation (Signed)
Anesthesia Evaluation  Patient identified by MRN, date of birth, ID band Patient awake    Reviewed: Allergy & Precautions, NPO status , Patient's Chart, lab work & pertinent test results  Airway Mallampati: I  TM Distance: >3 FB Neck ROM: Full    Dental   Pulmonary Current Smoker,    Pulmonary exam normal        Cardiovascular hypertension, Pt. on medications Normal cardiovascular exam     Neuro/Psych Anxiety    GI/Hepatic   Endo/Other  Hypothyroidism   Renal/GU      Musculoskeletal   Abdominal   Peds  Hematology   Anesthesia Other Findings   Reproductive/Obstetrics                             Anesthesia Physical Anesthesia Plan  ASA: II  Anesthesia Plan: General   Post-op Pain Management:    Induction: Intravenous  PONV Risk Score and Plan: 1 and Ondansetron, Dexamethasone and Treatment may vary due to age or medical condition  Airway Management Planned: Oral ETT  Additional Equipment:   Intra-op Plan:   Post-operative Plan: Extubation in OR  Informed Consent: I have reviewed the patients History and Physical, chart, labs and discussed the procedure including the risks, benefits and alternatives for the proposed anesthesia with the patient or authorized representative who has indicated his/her understanding and acceptance.     Plan Discussed with: CRNA and Surgeon  Anesthesia Plan Comments:         Anesthesia Quick Evaluation

## 2017-05-21 NOTE — Transfer of Care (Signed)
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6 C (P) 37.6 C  SpO2: 99%     Last Pain:  Vitals:   05/21/17 1058  TempSrc:   PainSc: 8       Patients Stated Pain Goal: 5 (24/11/46 4314)  Complications: No apparent anesthesia complications

## 2017-05-21 NOTE — Anesthesia Postprocedure Evaluation (Signed)
Anesthesia Post Note  Patient: [place] Everette Manas  Procedure(s) Performed: Anterior Cervical Decompression Fusion Cervical five-six, Cervical six-seven (N/A Neck)     Patient location during evaluation: PACU Anesthesia Type: General Level of consciousness: awake and alert Pain management: pain level controlled Vital Signs Assessment: post-procedure vital signs reviewed and stable Respiratory status: spontaneous breathing, nonlabored ventilation and respiratory function stable Cardiovascular status: blood pressure returned to baseline and stable Postop Assessment: no apparent nausea or vomiting Anesthetic complications: no    Last Vitals:  Vitals:   05/21/17 1615 05/21/17 1629  BP:  121/76  Pulse: 85 83  Resp: 17 14  Temp:    SpO2: 92% 93%    Last Pain:  Vitals:   05/21/17 1530  TempSrc:   PainSc: 8                  Carole Doner,W. EDMOND

## 2017-05-21 NOTE — Anesthesia Procedure Notes (Signed)
Procedure Name: Intubation Date/Time: 05/21/2017 1:22 PM Performed by: Julieta Bellini, CRNA Pre-anesthesia Checklist: Patient identified, Emergency Drugs available, Suction available and Patient being monitored Patient Re-evaluated:Patient Re-evaluated prior to induction Oxygen Delivery Method: Circle system utilized Preoxygenation: Pre-oxygenation with 100% oxygen Induction Type: IV induction Ventilation: Mask ventilation without difficulty Laryngoscope Size: Mac and 4 Grade View: Grade I Tube type: Oral Tube size: 7.5 mm Number of attempts: 1 Airway Equipment and Method: Stylet Placement Confirmation: ETT inserted through vocal cords under direct vision,  positive ETCO2 and breath sounds checked- equal and bilateral Secured at: 22 cm Tube secured with: Tape Dental Injury: Teeth and Oropharynx as per pre-operative assessment

## 2017-05-22 MED ORDER — HYDROCODONE-ACETAMINOPHEN 5-325 MG PO TABS
2.0000 | ORAL_TABLET | ORAL | Status: DC | PRN
  Administered 2017-05-22 (×2): 2 via ORAL
  Filled 2017-05-22 (×2): qty 2

## 2017-05-22 MED ORDER — HYDROCODONE-ACETAMINOPHEN 5-325 MG PO TABS: 1.0000 | ORAL_TABLET | ORAL | 0 refills | Status: DC | PRN

## 2017-05-22 MED ORDER — METHOCARBAMOL 500 MG PO TABS: 500.0000 mg | ORAL_TABLET | Freq: Four times a day (QID) | ORAL | 1 refills | Status: DC | PRN

## 2017-05-22 NOTE — Discharge Summary (Signed)
Physician Discharge Summary  Patient ID: Chad Wilson MRN: [id number] DOB/AGE: 1975/10/17 42 y.o.  Admit date: 05/21/2017 Discharge date: 05/22/2017  Admission Diagnoses: cervical stenosis with myeloradiculopathy    Discharge Diagnoses: same   Discharged Condition: good  Hospital Course: The patient was admitted on 05/21/2017 and taken to the operating room where the patient underwent ACDF C5-6 C6-7. The patient tolerated the procedure well and was taken to the recovery room and then to the floor in stable condition. The hospital course was routine. There were no complications. The wound remained clean dry and intact. Pt had appropriate neck soreness. No complaints of arm pain or new N/T/W. The patient remained afebrile with stable vital signs, and tolerated a regular diet. The patient continued to increase activities, and pain was well controlled with oral pain medications.   Consults: none  Significant Diagnostic Studies:  Results for orders placed or performed during the hospital encounter of 05/07/17  Surgical pcr screen  Result Value Ref Range   MRSA, PCR NEGATIVE NEGATIVE   Staphylococcus aureus NEGATIVE NEGATIVE  CBC WITH DIFFERENTIAL  Result Value Ref Range   WBC 8.7 4.0 - 10.5 K/uL   RBC 4.37 4.22 - 5.81 MIL/uL   Hemoglobin 14.8 13.0 - 17.0 g/dL   HCT 42.5 39.0 - 52.0 %   MCV 97.3 78.0 - 100.0 fL   MCH 33.9 26.0 - 34.0 pg   MCHC 34.8 30.0 - 36.0 g/dL   RDW 14.3 11.5 - 15.5 %   Platelets 240 150 - 400 K/uL   Neutrophils Relative % 57 %   Lymphocytes Relative 28 %   Monocytes Relative 12 %   Eosinophils Relative 2 %   Basophils Relative 1 %   Neutro Abs 5.0 1.7 - 7.7 K/uL   Lymphs Abs 2.4 0.7 - 4.0 K/uL   Monocytes Absolute 1.0 0.1 - 1.0 K/uL   Eosinophils Absolute 0.2 0.0 - 0.7 K/uL   Basophils Absolute 0.1 0.0 - 0.1 K/uL   Smear Review MORPHOLOGY UNREMARKABLE   Protime-INR  Result Value Ref Range   Prothrombin Time 13.0 11.4 - 15.2 seconds   INR 0.99    Comprehensive metabolic panel  Result Value Ref Range   Sodium 139 135 - 145 mmol/L   Potassium 4.5 3.5 - 5.1 mmol/L   Chloride 103 101 - 111 mmol/L   CO2 26 22 - 32 mmol/L   Glucose, Bld 113 (H) 65 - 99 mg/dL   BUN 11 6 - 20 mg/dL   Creatinine, Ser 0.83 0.61 - 1.24 mg/dL   Calcium 9.4 8.9 - 10.3 mg/dL   Total Protein 7.2 6.5 - 8.1 g/dL   Albumin 4.1 3.5 - 5.0 g/dL   AST 22 15 - 41 U/L   ALT 27 17 - 63 U/L   Alkaline Phosphatase 71 38 - 126 U/L   Total Bilirubin 0.9 0.3 - 1.2 mg/dL   GFR calc non Af Amer >60 >60 mL/min   GFR calc Af Amer >60 >60 mL/min   Anion gap 10 5 - 15    Dg Cervical Spine 2-3 Views  Result Date: 05/21/2017 CLINICAL DATA:  C5-7 ACDF EXAM: DG C-ARM 61-120 MIN; CERVICAL SPINE - 2-3 VIEW COMPARISON:  Cervical spine MRI 02/02/2017 FINDINGS: Three images from intraoperative fluoroscopy obtained during C5-C7 ACDF. 18 seconds of fluoroscopy time was reported. IMPRESSION: Intraoperative fluoroscopy. Electronically Signed   By: Ulyses Jarred M.D.   On: 05/21/2017 15:25   Dg C-arm 1-60 Min  Result Date: 05/21/2017 CLINICAL  DATA:  C5-7 ACDF EXAM: DG C-ARM 61-120 MIN; CERVICAL SPINE - 2-3 VIEW COMPARISON:  Cervical spine MRI 02/02/2017 FINDINGS: Three images from intraoperative fluoroscopy obtained during C5-C7 ACDF. 18 seconds of fluoroscopy time was reported. IMPRESSION: Intraoperative fluoroscopy. Electronically Signed   By: Ulyses Jarred M.D.   On: 05/21/2017 15:25    Antibiotics:  Anti-infectives (From admission, onward)   Start     Dose/Rate Route Frequency Ordered Stop   05/21/17 1730  ceFAZolin (ANCEF) IVPB 2g/100 mL premix     2 g 200 mL/hr over 30 Minutes Intravenous Every 8 hours 05/21/17 1726 05/22/17 0220   05/21/17 1352  bacitracin 50,000 Units in sodium chloride irrigation 0.9 % 500 mL irrigation  Status:  Discontinued       As needed 05/21/17 1352 05/21/17 1527   05/21/17 1000  ceFAZolin (ANCEF) IVPB 2g/100 mL premix     2 g 200 mL/hr over 30 Minutes  Intravenous On call to O.R. 05/21/17 1000 05/21/17 1321      Discharge Exam: Blood pressure 128/78, pulse 81, temperature 99.3 F (37.4 C), temperature source Oral, resp. rate 18, height 5' 7"  (1.702 m), weight 84.7 kg (186 lb 11.2 oz), SpO2 95 %. Neurologic: Grossly normal with stable myelopathy Dressing dry  Discharge Medications:   Allergies as of 05/22/2017      Reactions   Chantix [varenicline] Other (See Comments)   Agitation and sleep walking       Medication List    TAKE these medications   citalopram 40 MG tablet Commonly known as:  CELEXA Take 1 tablet (40 mg total) by mouth daily.   HYDROcodone-acetaminophen 5-325 MG tablet Commonly known as:  NORCO/VICODIN Take 1 tablet by mouth every 4 (four) hours as needed for moderate pain ((score 4 to 6)). What changed:    when to take this  reasons to take this   levothyroxine 125 MCG tablet Commonly known as:  SYNTHROID, LEVOTHROID Take 1 tablet (125 mcg total) by mouth daily.   lisinopril 40 MG tablet Commonly known as:  PRINIVIL,ZESTRIL Take 1 tablet (40 mg total) by mouth daily.   methocarbamol 500 MG tablet Commonly known as:  ROBAXIN Take 1 tablet (500 mg total) by mouth every 6 (six) hours as needed for muscle spasms.   sildenafil 20 MG tablet Commonly known as:  REVATIO TAKE 5 TABLETS BY MOUTH EVERY DAY AS NEEDED FOR ERECTILE DYSFUNCTION       Disposition: home   Final Dx: ACDF  Discharge Instructions     Remove dressing in 72 hours   Complete by:  As directed    Call MD for:  difficulty breathing, headache or visual disturbances   Complete by:  As directed    Call MD for:  persistant nausea and vomiting   Complete by:  As directed    Call MD for:  redness, tenderness, or signs of infection (pain, swelling, redness, odor or green/yellow discharge around incision site)   Complete by:  As directed    Call MD for:  severe uncontrolled pain   Complete by:  As directed    Call MD for:  temperature  >100.4   Complete by:  As directed    Diet - low sodium heart healthy   Complete by:  As directed    Increase activity slowly   Complete by:  As directed       Follow-up Information    Eustace Moore, MD. Schedule an appointment as soon as possible for a  visit in 2 week(s).   Specialty:  Neurosurgery Contact information: [address] [place] [address] [place] [address] [phone]            Signed: Eustace Moore 05/22/2017, 7:32 AM

## 2017-05-22 NOTE — Progress Notes (Signed)
Pt doing well. Pt and wife given D/C instructions with Rx's, verbal understanding was provided. Pt's incision has no sign of infection. Pt's IV was removed prior to D/C. Pt D/C'd home via walking @ 0840 per MD order. Pt is stable @ D/C and has no other needs at this time. Holli Humbles, RN

## 2017-05-22 NOTE — Evaluation (Signed)
Occupational Therapy Evaluation Patient Details Name: Chad Wilson MRN: [id number] DOB: 1975/09/17 Today's Date: 05/22/2017    History of Present Illness pt is a 42 y/o male now s/p ACDF C5-6 C6-7; PMhx includes anxiety, HTN, thyroid (Graves) disease    Clinical Impression   This 42 y/o M presents with the above. At baseline pt is independent with ADLs and functional mobility. Pt completing room and hallway level functional mobility without AD and supervision throughout; currently requires MinA for UB ADLs and MinGuard for LB ADLs. Pt will return home with spouse who is able to provide ADL assist PRN. Education provided on cervical precautions, safety and compensatory techniques for completing ADLs and functional mobility while adhering to precautions with pt/pt spouse verbalizing understanding; questions answered throughout. No further acute OT needs identified at this time. Will sign off.     Follow Up Recommendations  Follow surgeon's recommendation for DC plan and follow-up therapies;Supervision - Intermittent    Equipment Recommendations  None recommended by OT           Precautions / Restrictions Precautions Precautions: Cervical Precaution Booklet Issued: Yes (comment) Precaution Comments: issued and reviewed with pt/pt spouse  Required Braces or Orthoses: (per MD order no brace needed ) Restrictions Weight Bearing Restrictions: No      Mobility Bed Mobility               General bed mobility comments: OOB in recliner; verbally reviewed log roll technique   Transfers Overall transfer level: Modified independent Equipment used: None             General transfer comment: pt completing sit<>stand from recliner without difficulty     Balance Overall balance assessment: Mild deficits observed, not formally tested                                         ADL either performed or assessed with clinical judgement   ADL Overall ADL's :  Needs assistance/impaired Eating/Feeding: Supervision/ safety;Sitting   Grooming: Supervision/safety;Standing   Upper Body Bathing: Min guard;Sitting   Lower Body Bathing: Min guard;Sit to/from stand   Upper Body Dressing : Minimal assistance;Sitting Upper Body Dressing Details (indicate cue type and reason): pt reports needed some assist for donning overhead shirt this AM  Lower Body Dressing: Min guard;Sit to/from stand Lower Body Dressing Details (indicate cue type and reason): pt able to perform figure 4 technique without difficulty  Toilet Transfer: Min guard;Ambulation Toilet Transfer Details (indicate cue type and reason): simulated in transfer to/from [place] and Hygiene: Min guard;Sit to/from Nurse, children's Details (indicate cue type and reason): eduated to complete bathing in walk-in shower with use of shower seat for increased safety and ease of reaching LEs while adhering to cervical precautions  Functional mobility during ADLs: Supervision/safety General ADL Comments: pt completing room and hallway functional mobility with supervision; educated on cervical precautions, safety and compensatory techniques for completing ADLs and functional transfers while adhering to precautions with pt/pt spouse verbalizing understanding                          Pertinent Vitals/Pain Pain Assessment: Faces Faces Pain Scale: Hurts a little bit Pain Location: neck at incision site  Pain Descriptors / Indicators: Guarding Pain Intervention(s): Monitored during session  Extremity/Trunk Assessment Upper Extremity Assessment Upper Extremity Assessment: RUE deficits/detail RUE Deficits / Details: decreased fine motor though appears WFL; pt reports numbness in R hand RUE Sensation: decreased light touch RUE Coordination: decreased fine motor   Lower Extremity Assessment Lower Extremity Assessment: Overall WFL for tasks  assessed(pt reports numbness in RLE )   Cervical / Trunk Assessment Cervical / Trunk Assessment: Other exceptions Cervical / Trunk Exceptions: s/p cervical surgery    Communication Communication Communication: No difficulties   Cognition Arousal/Alertness: Awake/alert Behavior During Therapy: WFL for tasks assessed/performed Overall Cognitive Status: Within Functional Limits for tasks assessed                                                      Home Living Family/patient expects to be discharged to:: Private residence Living Arrangements: Spouse/significant other Available Help at Discharge: Family Type of Home: House Home Access: Stairs to enter Technical brewer of Steps: 2 Entrance Stairs-Rails: Left Home Layout: Two level;Able to live on main level with bedroom/bathroom Alternate Level Stairs-Number of Steps: pt reports doesn't use upstairts level    Bathroom Shower/Tub: Tub/shower unit;Walk-in Psychologist, prison and probation services: Standard     Home Equipment: Shower seat - built in          Prior Functioning/Environment Level of Independence: Independent                 OT Problem List: Decreased knowledge of use of DME or AE;Decreased knowledge of precautions;Decreased range of motion            OT Goals(Current goals can be found in the care plan section) Acute Rehab OT Goals Patient Stated Goal: return home  OT Goal Formulation: All assessment and education complete, DC therapy                                 AM-PAC PT "6 Clicks" Daily Activity     Outcome Measure Help from another person eating meals?: None Help from another person taking care of personal grooming?: None Help from another person toileting, which includes using toliet, bedpan, or urinal?: None Help from another person bathing (including washing, rinsing, drying)?: A Little Help from another person to put on and taking off regular upper body clothing?: A  Little Help from another person to put on and taking off regular lower body clothing?: A Little 6 Click Score: 21   End of Session Nurse Communication: Mobility status  Activity Tolerance: Patient tolerated treatment well Patient left: in chair;with call bell/phone within reach;with family/visitor present  OT Visit Diagnosis: Other abnormalities of gait and mobility (R26.89)                Time: 7026-3785 OT Time Calculation (min): 13 min Charges:  OT General Charges $OT Visit: 1 Visit OT Evaluation $OT Eval Low Complexity: 1 Low G-Codes:     Lou Cal, OT Pager [phone] 05/22/2017   Raymondo Band 05/22/2017, 8:38 AM

## 2017-05-23 ENCOUNTER — Encounter (HOSPITAL_COMMUNITY): Payer: Self-pay | Admitting: Neurological Surgery

## 2017-05-28 ENCOUNTER — Other Ambulatory Visit: Payer: Self-pay | Admitting: *Deleted

## 2017-05-28 NOTE — Patient Outreach (Signed)
[facility]) Care Management  05/28/2017  Chad Wilson 18-Feb-1976 [id number]   Subjective: Telephone call to patient's home  / mobile number, no answer, left HIPAA compliant voicemail message, and requested call back.    Objective: Per KPN (Knowledge Performance Now, point of care tool) and chart review, patient to be admitted 05/21/17 for possible corpectomy (C5-6, C6-C7) at [facility].   Patient also has a history of Acute hepatitis C, Graves disease, Panic disorder without agoraphobia; Thyroid disease; Alcohol abuse, Tobacco use disorder, and hypertension.      Assessment:  Received UMR Preoperative / Transition of care referral on 04/24/17.    Preoperative call completed, and Transition of care follow up pending patient contact.      Plan: RNCM will call patient for 2nd telephone outreach attempt, transition of care follow up, within 10 business days if no return call.       Leyani Gargus H. Annia Friendly, BSN, [place] Management [facility] Telephonic CM Phone: [phone] Fax: [phone]

## 2017-05-29 ENCOUNTER — Other Ambulatory Visit: Payer: Self-pay | Admitting: *Deleted

## 2017-05-29 ENCOUNTER — Ambulatory Visit: Payer: Self-pay | Admitting: *Deleted

## 2017-05-29 ENCOUNTER — Encounter: Payer: Self-pay | Admitting: *Deleted

## 2017-05-29 NOTE — Patient Outreach (Signed)
[facility]) Care Management  05/29/2017  Mali Everette Doescher 1975/10/28 [id number]   Subjective: Telephone call to patient's home / mobile number, spoke with patient, and HIPAA verified.  Discussed [facility] Care Management UMR Transition of care follow up, patient voiced understanding, and is in agreement to follow up.  Patient states he remembers speaking with this RNCM in the past.  States he is doing great, surgery went well, up walking around, neck sore, no tingling or numbness, following activity restrictions, has a follow up appointment with surgeon on 06/07/17, and with primary MD on 06/12/17.  Advised to speak with surgeon regarding home exercise program based on activity restrictions, patient voiced understanding, and states he will follow up.  Patient states he is able to manage self care and has assistance as needed with activities of daily living / home management.  Patient voices understanding of medical diagnosis, surgery,  and treatment plan. [facility] benefits discussed on 04/29/17  preoperative call and patient states no additional questions at this time.  Patient states he does not have any education material, transition of care, care coordination, disease management, disease monitoring, transportation, community resource, or pharmacy needs at this time.  States he is very appreciative of the follow up and is in agreement to receive [place] Management information.       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   Status post Decompressive anterior cervical discectomy C5-6 and C6-7, Anterior cervical arthrodesis C5-6 and C6-7 utilizing a PTi interbody cage packed with locally harvested morcellized autologous bone graft, and Anterior cervical plating C5-6 and C6-7 utilizing a Alphatec plate  at [facility]. Patient also has a history of Acute  hepatitis C,Graves disease,Panic disorder without agoraphobia; Thyroid disease; Alcohol abuse,Tobacco use disorder,and hypertension.      Assessment: Received UMR Preoperative / Transition of care referral on 04/24/17.Preoperative call completed, and  Transition of care follow up completed, no care management needs, and will proceed with case closure.        Plan:RNCM will send patient successful outreach letter, [facility] pamphlet, and magnet. RNCM will send case closure due to follow up completed / no care management needs request to [facility].        Gary Gabrielsen H. Annia Friendly, BSN, [place] Management [facility] Telephonic CM Phone: [phone] Fax: [phone]

## 2017-06-12 ENCOUNTER — Encounter: Payer: Self-pay | Admitting: Endocrinology

## 2017-06-12 ENCOUNTER — Ambulatory Visit (INDEPENDENT_AMBULATORY_CARE_PROVIDER_SITE_OTHER): Payer: 59 | Admitting: Endocrinology

## 2017-06-12 VITALS — BP 138/84 | HR 68 | Wt 187.2 lb

## 2017-06-12 DIAGNOSIS — E890 Postprocedural hypothyroidism: Secondary | ICD-10-CM | POA: Diagnosis not present

## 2017-06-12 LAB — T4, FREE: Free T4: 0.85 ng/dL (ref 0.60–1.60)

## 2017-06-12 LAB — TSH: TSH: 6.41 u[IU]/mL — ABNORMAL HIGH (ref 0.35–4.50)

## 2017-06-12 MED ORDER — SILDENAFIL CITRATE 100 MG PO TABS: 100.0000 mg | ORAL_TABLET | Freq: Every day | ORAL | 11 refills | Status: DC | PRN

## 2017-06-12 MED ORDER — LEVOTHYROXINE SODIUM 137 MCG PO TABS: 137.0000 ug | ORAL_TABLET | Freq: Every day | ORAL | 3 refills | Status: DC

## 2017-06-12 NOTE — Progress Notes (Signed)
Subjective:    Patient ID: Chad Wilson, male    DOB: July 12, 1975, 42 y.o.   MRN: [id number]  HPI Pt returns for f/u of post-RAI hypothyroidism (due to [place]; dx'ed 2017, when he was incarcerated; he took tapazole, but this was stopped for RAI, which he had in Feb, and again in Oct of 2018; he started Synthroid in Nov of 2018).  pt states he feels well in general.  Past Medical History:  Diagnosis Date  . Acute hepatitis C without mention of hepatic coma(070.51)    has been cleared   . Alcohol abuse, unspecified   . Anxiety states   . Essential hypertension, benign   . Graves disease    treated by Dr Loanne Drilling  . Panic disorder without agoraphobia   . Thyroid disease    Graves Disease  . Tobacco use disorder     Past Surgical History:  Procedure Laterality Date  . ANTERIOR CERVICAL DECOMP/DISCECTOMY FUSION N/A 05/21/2017   Procedure: Anterior Cervical Decompression Fusion Cervical five-six, Cervical six-seven;  Surgeon: Eustace Moore, MD;  Location: [place];  Service: Neurosurgery;  Laterality: N/A;  . MULTIPLE TOOTH EXTRACTIONS  15 yrs. ago    Social History   Socioeconomic History  . Marital status: Married    Spouse name: Not on file  . Number of children: 0  . Years of education: Not on file  . Highest education level: Not on file  Occupational History    Employer: [employer]  Social Needs  . Financial resource strain: Not on file  . Food insecurity:    Worry: Not on file    Inability: Not on file  . Transportation needs:    Medical: Not on file    Non-medical: Not on file  Tobacco Use  . Smoking status: Current Every Day Smoker    Packs/day: 1.00    Years: 30.00    Pack years: 30.00  . Smokeless tobacco: Current User    Types: Chew  Substance and Sexual Activity  . Alcohol use: Yes    Comment: socially   . Drug use: No    Comment: cocaine  . Sexual activity: Not on file  Lifestyle  . Physical activity:    Days per week: Not on file     Minutes per session: Not on file  . Stress: Not on file  Relationships  . Social connections:    Talks on phone: Not on file    Gets together: Not on file    Attends religious service: Not on file    Active member of club or organization: Not on file    Attends meetings of clubs or organizations: Not on file    Relationship status: Not on file  . Intimate partner violence:    Fear of current or ex partner: Not on file    Emotionally abused: Not on file    Physically abused: Not on file    Forced sexual activity: Not on file  Other Topics Concern  . Not on file  Social History Narrative   He is working in [employer]    Diet: Fruits and veggies, water poweraid, occ sweet tea    Current Outpatient Medications on File Prior to Visit  Medication Sig Dispense Refill  . citalopram (CELEXA) 40 MG tablet Take 1 tablet (40 mg total) by mouth daily. 90 tablet 1  . HYDROcodone-acetaminophen (NORCO/VICODIN) 5-325 MG tablet Take 1 tablet by mouth every 4 (four) hours as needed for moderate  pain ((score 4 to 6)). 30 tablet 0  . lisinopril (PRINIVIL,ZESTRIL) 40 MG tablet Take 1 tablet (40 mg total) by mouth daily. 90 tablet 3  . methocarbamol (ROBAXIN) 500 MG tablet Take 1 tablet (500 mg total) by mouth every 6 (six) hours as needed for muscle spasms. 60 tablet 1  . [DISCONTINUED] omeprazole (PRILOSEC) 20 MG capsule Take 1 capsule (20 mg total) by mouth daily. 30 capsule 3   No current facility-administered medications on file prior to visit.     Allergies  Allergen Reactions  . Chantix [Varenicline] Other (See Comments)    Agitation and sleep walking     Family History  Problem Relation Age of Onset  . Anxiety disorder Father   . Heart disease Father   . Hypertension Father   . Thyroid disease Mother   . Anxiety disorder Sister   . Hyperthyroidism Sister     BP 138/84 (BP Location: Left Arm, Patient Position: Sitting, Cuff Size: Normal)   Pulse 68   Wt 187 lb 3.2 oz (84.9  kg)   SpO2 95%   BMI 29.32 kg/m    Review of Systems ED sxs are well-controlled on rx.     Objective:   Physical Exam VITAL SIGNS:  See vs page.  GENERAL: no distress.  eyes: slight bilat periorbital swelling and proptosis.   Neck: a healed scar is present.  I do not appreciate a nodule in the thyroid or elsewhere in the neck, but the thyroid still seems slightly enlarged on the right.   Lab Results  Component Value Date   TSH 6.41 (H) 06/12/2017      Assessment & Plan:  Post-RAI hypothyroidism: he needs increased rx ED: well-controlled.  I refilled viagra

## 2017-06-12 NOTE — Patient Instructions (Addendum)
blood tests are requested for you today.  We'll let you know about the results. Your blood pressure is high today.  Please see your primary care provider soon, to have it rechecked.   I have sent a prescription to your pharmacy, to change the viagra to 100 mg at a time. Please come back for a follow-up appointment in 2 months.

## 2017-06-24 ENCOUNTER — Encounter: Payer: Self-pay | Admitting: Adult Health

## 2017-06-24 ENCOUNTER — Other Ambulatory Visit: Payer: Self-pay | Admitting: Adult Health

## 2017-06-24 DIAGNOSIS — F419 Anxiety disorder, unspecified: Principal | ICD-10-CM

## 2017-06-24 DIAGNOSIS — F329 Major depressive disorder, single episode, unspecified: Secondary | ICD-10-CM

## 2017-06-24 DIAGNOSIS — F32A Depression, unspecified: Secondary | ICD-10-CM

## 2017-06-24 MED ORDER — CITALOPRAM HYDROBROMIDE 40 MG PO TABS: 40.0000 mg | ORAL_TABLET | Freq: Every day | ORAL | 1 refills | Status: DC

## 2017-06-24 MED FILL — CITALOPRAM HBR 40 MG TABLET: 40 | 90 days supply | Qty: 90 | Fill #0

## 2017-06-24 MED FILL — HYDROCODON-APAP 5-325: 5-325 | 7 days supply | Qty: 30 | Fill #0

## 2017-06-25 NOTE — Telephone Encounter (Signed)
DENIED.  SENT TO [place] [facility] ON 06/24/17

## 2017-07-06 ENCOUNTER — Encounter: Payer: Self-pay | Admitting: Endocrinology

## 2017-07-07 MED FILL — HYDROCODON-APAP 5-325: 5-325 | 8 days supply | Qty: 30 | Fill #0

## 2017-07-07 MED FILL — METHOCARBAMOL 500 MG TABS: 500 | 6 days supply | Qty: 45 | Fill #0

## 2017-07-09 ENCOUNTER — Encounter: Payer: Self-pay | Admitting: Podiatry

## 2017-07-09 ENCOUNTER — Ambulatory Visit (INDEPENDENT_AMBULATORY_CARE_PROVIDER_SITE_OTHER): Payer: 59

## 2017-07-09 ENCOUNTER — Ambulatory Visit (INDEPENDENT_AMBULATORY_CARE_PROVIDER_SITE_OTHER): Payer: 59 | Admitting: Podiatry

## 2017-07-09 DIAGNOSIS — S99921A Unspecified injury of right foot, initial encounter: Secondary | ICD-10-CM | POA: Diagnosis not present

## 2017-07-09 MED ORDER — METHYLPREDNISOLONE 4 MG PO TBPK: ORAL_TABLET | ORAL | 0 refills | Status: DC

## 2017-07-09 MED ORDER — DICLOFENAC SODIUM 75 MG PO TBEC: 75.0000 mg | DELAYED_RELEASE_TABLET | Freq: Two times a day (BID) | ORAL | 1 refills | Status: DC

## 2017-07-09 MED FILL — METHYLPREDNISOLONE 4 MG TAB: 4 | 6 days supply | Qty: 21 | Fill #0

## 2017-07-09 MED FILL — DICLOFENAC SODIUM 75 MG TAB: 75 | 30 days supply | Qty: 60 | Fill #0

## 2017-07-09 NOTE — Progress Notes (Signed)
   HPI: 42 year old male presents the office today for evaluation of right foot pain with redness and swelling.  He relates that approximately 1 weeks ago he tripped and twisted his foot, however he is only noted significant pain and swelling for the past 2-3 days.  He is unable to work due to the significant pain.  He presents today for further treatment evaluation  Past Medical History:  Diagnosis Date  . Acute hepatitis C without mention of hepatic coma(070.51)    has been cleared   . Alcohol abuse, unspecified   . Anxiety states   . Essential hypertension, benign   . Graves disease    treated by Dr Loanne Drilling  . Panic disorder without agoraphobia   . Thyroid disease    Graves Disease  . Tobacco use disorder      Physical Exam: General: The patient is alert and oriented x3 in no acute distress.  Dermatology: Skin is warm, dry and supple bilateral lower extremities. Negative for open lesions or macerations.  Vascular: Palpable pedal pulses bilaterally.  Erythema with edema noted mostly overlying the second and third metatarsals of the right foot.  Capillary refill within normal limits.  Neurological: Epicritic and protective threshold grossly intact bilaterally.   Musculoskeletal Exam: Range of motion within normal limits to all pedal and ankle joints bilateral. Muscle strength 5/5 in all groups bilateral.  Significant pain on palpation noted to the dorsum of the right foot overlying the second third metatarsals.  Radiographic Exam:  Normal osseous mineralization. Joint spaces preserved. No fracture/dislocation/boney destruction With exception of a subtle radiopacity noted to the medial cortex of the second metatarsal neck suspicious for possible stress fracture.  Assessment: 1.  Possible stress fracture second metatarsal right foot 2.  Erythema with edema right foot   Plan of Care:  1. Patient evaluated. X-Rays reviewed.  2.  Postoperative shoe dispensed today.  Weightbearing  in the postoperative shoe as tolerated 3.  Today we are going to give the patient a note to refrain from work times 1 week minimum.  May need additional weeks 4.  Prescription for Medrol Dosepak 5.  Prescription for diclofenac 75 mg 6.  Return to clinic in 2 weeks for follow-up x-rays that would better determine possible stress fracture      Edrick Kins, DPM [facility]  Dr. Edrick Kins, DPM    [address], [place] [address]                Office [phone]  Fax [phone]

## 2017-07-11 MED ORDER — HYDROCODONE-ACETAMINOPHEN 5-325 MG PO TABS: 1.0000 | ORAL_TABLET | Freq: Four times a day (QID) | ORAL | 0 refills | Status: DC | PRN

## 2017-07-14 DIAGNOSIS — G959 Disease of spinal cord, unspecified: Secondary | ICD-10-CM | POA: Diagnosis not present

## 2017-07-14 MED FILL — HYDROCODON-APAP 5-325: 5-325 | 8 days supply | Qty: 30 | Fill #0

## 2017-07-14 MED FILL — tiZANidine HCL 4 MG TABS: 4 | 30 days supply | Qty: 90 | Fill #0

## 2017-07-14 MED FILL — LEVOTHYROXINE 137 MCG TABLE: 137 | 30 days supply | Qty: 30 | Fill #0

## 2017-07-18 ENCOUNTER — Encounter: Payer: 59 | Admitting: Adult Health

## 2017-07-18 DIAGNOSIS — Z0289 Encounter for other administrative examinations: Secondary | ICD-10-CM

## 2017-07-21 MED FILL — LISINOPRIL 40 MG TABLET: 40 | 90 days supply | Qty: 90 | Fill #0

## 2017-07-21 MED FILL — HYDROCODON-APAP 5-325: 5-325 | 7 days supply | Qty: 30 | Fill #0

## 2017-07-21 MED FILL — SILDENAFIL CITRATE 100 MG T: 100 | 30 days supply | Qty: 6 | Fill #0

## 2017-07-28 MED FILL — HYDROCODON-APAP 5-325: 5-325 | 8 days supply | Qty: 30 | Fill #0

## 2017-08-04 MED FILL — HYDROCODON-APAP 5-325: 5-325 | 8 days supply | Qty: 30 | Fill #0

## 2017-08-07 ENCOUNTER — Ambulatory Visit (INDEPENDENT_AMBULATORY_CARE_PROVIDER_SITE_OTHER): Payer: 59 | Admitting: Endocrinology

## 2017-08-07 ENCOUNTER — Encounter: Payer: Self-pay | Admitting: Endocrinology

## 2017-08-07 VITALS — BP 132/88 | HR 72 | Wt 187.2 lb

## 2017-08-07 DIAGNOSIS — E890 Postprocedural hypothyroidism: Secondary | ICD-10-CM

## 2017-08-07 LAB — TSH: TSH: 5.26 u[IU]/mL — ABNORMAL HIGH (ref 0.35–4.50)

## 2017-08-07 MED ORDER — LEVOTHYROXINE SODIUM 150 MCG PO TABS: 150.0000 ug | ORAL_TABLET | Freq: Every day | ORAL | 3 refills | Status: DC

## 2017-08-07 NOTE — Progress Notes (Signed)
Subjective:    Patient ID: Chad Wilson, male    DOB: Mar 31, 1975, 42 y.o.   MRN: [id number]  HPI Pt returns for f/u of post-RAI hypothyroidism (due to [place]; dx'ed 2017; he took tapazole, but this was stopped for RAI, which he had in Feb, and again in Oct of 2018; he started Synthroid in Nov of 2018).  pt states he feels well in general.  Past Medical History:  Diagnosis Date  . Acute hepatitis C without mention of hepatic coma(070.51)    has been cleared   . Alcohol abuse, unspecified   . Anxiety states   . Essential hypertension, benign   . Graves disease    treated by Dr Loanne Drilling  . Panic disorder without agoraphobia   . Thyroid disease    Graves Disease  . Tobacco use disorder     Past Surgical History:  Procedure Laterality Date  . ANTERIOR CERVICAL DECOMP/DISCECTOMY FUSION N/A 05/21/2017   Procedure: Anterior Cervical Decompression Fusion Cervical five-six, Cervical six-seven;  Surgeon: Eustace Moore, MD;  Location: [place];  Service: Neurosurgery;  Laterality: N/A;  . MULTIPLE TOOTH EXTRACTIONS  15 yrs. ago    Social History   Socioeconomic History  . Marital status: Married    Spouse name: Not on file  . Number of children: 0  . Years of education: Not on file  . Highest education level: Not on file  Occupational History    Employer: [employer]  Social Needs  . Financial resource strain: Not on file  . Food insecurity:    Worry: Not on file    Inability: Not on file  . Transportation needs:    Medical: Not on file    Non-medical: Not on file  Tobacco Use  . Smoking status: Current Every Day Smoker    Packs/day: 1.00    Years: 30.00    Pack years: 30.00  . Smokeless tobacco: Current User    Types: Chew  Substance and Sexual Activity  . Alcohol use: Yes    Comment: socially   . Drug use: No    Comment: cocaine  . Sexual activity: Not on file  Lifestyle  . Physical activity:    Days per week: Not on file    Minutes per session:  Not on file  . Stress: Not on file  Relationships  . Social connections:    Talks on phone: Not on file    Gets together: Not on file    Attends religious service: Not on file    Active member of club or organization: Not on file    Attends meetings of clubs or organizations: Not on file    Relationship status: Not on file  . Intimate partner violence:    Fear of current or ex partner: Not on file    Emotionally abused: Not on file    Physically abused: Not on file    Forced sexual activity: Not on file  Other Topics Concern  . Not on file  Social History Narrative   He is working in [employer]    Diet: Fruits and veggies, water poweraid, occ sweet tea    Current Outpatient Medications on File Prior to Visit  Medication Sig Dispense Refill  . citalopram (CELEXA) 40 MG tablet Take 1 tablet (40 mg total) by mouth daily. 90 tablet 1  . diclofenac (VOLTAREN) 75 MG EC tablet Take 1 tablet (75 mg total) by mouth 2 (two) times daily. 60 tablet 1  .  lisinopril (PRINIVIL,ZESTRIL) 40 MG tablet Take 1 tablet (40 mg total) by mouth daily. 90 tablet 3  . methocarbamol (ROBAXIN) 500 MG tablet Take 1 tablet (500 mg total) by mouth every 6 (six) hours as needed for muscle spasms. 60 tablet 1  . sildenafil (VIAGRA) 100 MG tablet Take 1 tablet (100 mg total) by mouth daily as needed for erectile dysfunction. 10 tablet 11  . [DISCONTINUED] omeprazole (PRILOSEC) 20 MG capsule Take 1 capsule (20 mg total) by mouth daily. 30 capsule 3   No current facility-administered medications on file prior to visit.     Allergies  Allergen Reactions  . Chantix [Varenicline] Other (See Comments)    Agitation and sleep walking     Family History  Problem Relation Age of Onset  . Anxiety disorder Father   . Heart disease Father   . Hypertension Father   . Thyroid disease Mother   . Anxiety disorder Sister   . Hyperthyroidism Sister     BP 132/88   Pulse 72   Wt 187 lb 3.2 oz (84.9 kg)   SpO2 95%    BMI 29.32 kg/m    Review of Systems Denies leg edema.     Objective:   Physical Exam VITAL SIGNS:  See vs page.  GENERAL: no distress.  eyes: slight bilat periorbital swelling and proptosis.   Neck: a healed scar is present.  I do not appreciate a nodule in the thyroid or elsewhere in the neck, but the thyroid still seems slightly enlarged on the right.      Assessment & Plan:  Post-RAI hypothyroidism: due for recheck Grave's Dz.  eye involvement is stable  Patient Instructions  blood tests are requested for you today.  We'll let you know about the results. I would be happy to see you back here as needed

## 2017-08-07 NOTE — Patient Instructions (Addendum)
blood tests are requested for you today.  We'll let you know about the results. I would be happy to see you back here as needed

## 2017-08-08 ENCOUNTER — Other Ambulatory Visit: Payer: Self-pay | Admitting: Podiatry

## 2017-08-08 MED ORDER — HYDROCODONE-ACETAMINOPHEN 5-325 MG PO TABS: 1.0000 | ORAL_TABLET | Freq: Four times a day (QID) | ORAL | 0 refills | Status: DC | PRN

## 2017-08-08 MED FILL — LEVOTHYROXINE 150 MCG TAB: 150 | 90 days supply | Qty: 90 | Fill #0

## 2017-08-08 NOTE — Progress Notes (Unsigned)
Patient's wife called in stating patient is in more pain. Foot is very swollen. Unable to ambulate.  Refill Vicodin 5/361m sent into pharmacy.   BEdrick Kins DPM [facility]  Dr. BEdrick Kins DPM    [address] [place] [id number]               Office [phone] Fax [phone]

## 2017-08-12 ENCOUNTER — Other Ambulatory Visit: Payer: Self-pay | Admitting: Sports Medicine

## 2017-08-12 ENCOUNTER — Encounter: Payer: Self-pay | Admitting: Sports Medicine

## 2017-08-12 ENCOUNTER — Ambulatory Visit (INDEPENDENT_AMBULATORY_CARE_PROVIDER_SITE_OTHER): Payer: 59

## 2017-08-12 ENCOUNTER — Ambulatory Visit (INDEPENDENT_AMBULATORY_CARE_PROVIDER_SITE_OTHER): Payer: 59 | Admitting: Sports Medicine

## 2017-08-12 DIAGNOSIS — M7989 Other specified soft tissue disorders: Secondary | ICD-10-CM | POA: Diagnosis not present

## 2017-08-12 DIAGNOSIS — M79671 Pain in right foot: Secondary | ICD-10-CM

## 2017-08-12 DIAGNOSIS — S92301A Fracture of unspecified metatarsal bone(s), right foot, initial encounter for closed fracture: Secondary | ICD-10-CM

## 2017-08-12 DIAGNOSIS — S99921D Unspecified injury of right foot, subsequent encounter: Secondary | ICD-10-CM | POA: Diagnosis not present

## 2017-08-12 MED ORDER — HYDROCODONE-ACETAMINOPHEN 10-325 MG PO TABS: 1.0000 | ORAL_TABLET | Freq: Four times a day (QID) | ORAL | 0 refills | Status: DC | PRN

## 2017-08-12 MED FILL — HYDROCODON-APAP 10-325: 10-325 | 5 days supply | Qty: 20 | Fill #0

## 2017-08-12 NOTE — Progress Notes (Signed)
Subjective: Chad Wilson is a 42 y.o. male patient who presents to office for evaluation of Right foot pain. Patient complains of progressive pain especially over the weekend of slip and fall and hurt it again. Reports pain has increased and not relieved with Norco 5/325 and post op shoe. Patient denies any other pedal complaints.   Review of Systems  All other systems reviewed and are negative.  Patient Active Problem List   Diagnosis Date Noted  . 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 S/P cervical spinal fusion 05/21/2017  . Hypothyroidism 04/14/2017  . Chronic midline low back pain with bilateral sciatica 12/31/2016  . Neck pain 09/06/2011  . Cervical radiculopathy at C6 09/06/2011  . JAUNDICE 05/21/2010  . HEPATITIS C 11/21/2009  . Anxiety and depression 11/21/2009  . PANIC ATTACK 11/21/2009  . Alcohol abuse 11/21/2009  . TOBACCO ABUSE 11/21/2009  . HYPERTENSION, BENIGN ESSENTIAL 11/21/2009  . FATIGUE 11/21/2009    Current Outpatient Medications on File Prior to Visit  Medication Sig Dispense Refill  . citalopram (CELEXA) 40 MG tablet Take 1 tablet (40 mg total) by mouth daily. 90 tablet 1  . diclofenac (VOLTAREN) 75 MG EC tablet Take 1 tablet (75 mg total) by mouth 2 (two) times daily. 60 tablet 1  . levothyroxine (SYNTHROID, LEVOTHROID) 150 MCG tablet Take 1 tablet (150 mcg total) by mouth daily. 90 tablet 3  . lisinopril (PRINIVIL,ZESTRIL) 40 MG tablet Take 1 tablet (40 mg total) by mouth daily. 90 tablet 3  . methocarbamol (ROBAXIN) 500 MG tablet Take 1 tablet (500 mg total) by mouth every 6 (six) hours as needed for muscle spasms. 60 tablet 1  . sildenafil (VIAGRA) 100 MG tablet Take 1 tablet (100 mg total) by mouth daily as needed for erectile dysfunction. 10 tablet 11  . [DISCONTINUED] omeprazole (PRILOSEC) 20 MG capsule Take 1 capsule (20 mg total) by mouth daily. 30 capsule 3   No current facility-administered medications  on file prior to visit.     Allergies  Allergen Reactions  . Chantix [Varenicline] Other (See Comments)    Agitation and sleep walking     Objective:  General: Alert and oriented x3 in no acute distress  Dermatology: No open lesions bilateral lower extremities, no webspace macerations, no ecchymosis bilateral, all nails x 10 are well manicured.  Vascular: Focal edema noted to right forefoot. Dorsalis Pedis and Posterior Tibial pedal pulses 2/4, Capillary Fill Time 3 seconds,(+) pedal hair growth bilateral, Temperature gradient within normal limits.  Neurology: Gross sensation intact via light touch on right.   Musculoskeletal: There is tenderness with palpation at Right dorsal forefoot over 2-3 metatarsals. Mild pain with range of motion on right.     Gait: Unassisted, Antalgic gait  Xrays  Right Foot   Impression: Non-displaced fracture of 2-3 metatarsals, mild soft tissue swelling, no other acute findings.     Assessment and Plan: Problem List Items Addressed This Visit    None    Visit Diagnoses    Foot injury, right, subsequent encounter    -  Primary   Relevant Orders   DG Foot Complete Right   Multiple closed fractures of metatarsal bone of right foot, initial encounter       Right foot pain       Swelling of right foot          -Complete examination performed -Xrays reviewed -Discussed treatement options for fracture; risks, alternatives, and benefits explained. -Applied  Unna boot to right foot to keep intact for 5 days   -Dispensed CAM walker to patient to wear at all times and instructed on use -Recommend protection, rest, ice, elevation daily until symptoms improve -Rx Norco 10/326m  -Exogen bone stim to assist with fracture healing esp in setting of patient being a smoker with history of stress to bone -Work note: No work minimum 1 week  -Patient to return to office in 3-4 weeks for serial x-rays to assess healing  or sooner if condition  worsens.  TLandis Martins DPM

## 2017-08-13 ENCOUNTER — Encounter: Payer: 59 | Admitting: Podiatry

## 2017-08-13 ENCOUNTER — Telehealth: Payer: Self-pay | Admitting: *Deleted

## 2017-08-13 NOTE — Telephone Encounter (Signed)
-----   Message from Landis Martins, [place] sent at 08/12/2017  5:49 PM EDT ----- Regarding: Exogen Bone stim Stress fractures of 2-3rd metatarsals right foot

## 2017-08-13 NOTE — Telephone Encounter (Signed)
I informed Chad Wilson - [employer] pt's paper work would be available for pick up.

## 2017-08-14 MED FILL — SILDENAFIL CITRATE 100 MG T: 100 | 30 days supply | Qty: 6 | Fill #1

## 2017-08-15 ENCOUNTER — Telehealth: Payer: Self-pay | Admitting: *Deleted

## 2017-08-15 MED ORDER — HYDROCODONE-ACETAMINOPHEN 10-325 MG PO TABS: 1.0000 | ORAL_TABLET | Freq: Four times a day (QID) | ORAL | 0 refills | Status: DC | PRN

## 2017-08-15 NOTE — Telephone Encounter (Signed)
Pt request refill of the hydrocodone 10/334m. Dr. SCannon Kettleordered refill as previously. Rx given to pt's wife, [name]

## 2017-08-18 ENCOUNTER — Encounter: Payer: Self-pay | Admitting: Sports Medicine

## 2017-08-21 NOTE — Progress Notes (Signed)
This encounter was created in error - please disregard.

## 2017-08-25 ENCOUNTER — Telehealth: Payer: 59 | Admitting: Nurse Practitioner

## 2017-08-25 DIAGNOSIS — N3000 Acute cystitis without hematuria: Secondary | ICD-10-CM | POA: Diagnosis not present

## 2017-08-25 MED ORDER — CIPROFLOXACIN HCL 500 MG PO TABS: 500.0000 mg | ORAL_TABLET | Freq: Two times a day (BID) | ORAL | 0 refills | Status: DC

## 2017-08-25 MED FILL — CIPROFLOXACIN HCL 500 MG TA: 500 | 5 days supply | Qty: 10 | Fill #0

## 2017-08-25 NOTE — Progress Notes (Signed)

## 2017-08-26 ENCOUNTER — Ambulatory Visit (INDEPENDENT_AMBULATORY_CARE_PROVIDER_SITE_OTHER): Payer: 59 | Admitting: Sports Medicine

## 2017-08-26 ENCOUNTER — Encounter: Payer: Self-pay | Admitting: Sports Medicine

## 2017-08-26 ENCOUNTER — Ambulatory Visit (INDEPENDENT_AMBULATORY_CARE_PROVIDER_SITE_OTHER): Payer: 59

## 2017-08-26 DIAGNOSIS — M7989 Other specified soft tissue disorders: Secondary | ICD-10-CM

## 2017-08-26 DIAGNOSIS — S92301A Fracture of unspecified metatarsal bone(s), right foot, initial encounter for closed fracture: Secondary | ICD-10-CM | POA: Diagnosis not present

## 2017-08-26 DIAGNOSIS — S92301D Fracture of unspecified metatarsal bone(s), right foot, subsequent encounter for fracture with routine healing: Secondary | ICD-10-CM

## 2017-08-26 DIAGNOSIS — M79671 Pain in right foot: Secondary | ICD-10-CM

## 2017-08-26 MED ORDER — HYDROCODONE-ACETAMINOPHEN 10-325 MG PO TABS: 1.0000 | ORAL_TABLET | Freq: Four times a day (QID) | ORAL | 0 refills | Status: DC | PRN

## 2017-08-26 NOTE — Progress Notes (Signed)
Subjective: Chad Wilson is a 42 y.o. male patient who returns to office for follow up evaluation of Right foot pain secondary to met fracutres. Patient complains of pain when he is not in boot at work. States that after work pain is 7/10 with swelling. Reports that he can not wear boot when at work, must wear closed toed shoe. Reports pain is relieved with Norco but has been out and has taken Advil. Patient denies any other pedal complaints.    Patient Active Problem List   Diagnosis Date Noted  . 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 S/P cervical spinal fusion 05/21/2017  . Hypothyroidism 04/14/2017  . Chronic midline low back pain with bilateral sciatica 12/31/2016  . Neck pain 09/06/2011  . Cervical radiculopathy at C6 09/06/2011  . JAUNDICE 05/21/2010  . HEPATITIS C 11/21/2009  . Anxiety and depression 11/21/2009  . PANIC ATTACK 11/21/2009  . Alcohol abuse 11/21/2009  . TOBACCO ABUSE 11/21/2009  . HYPERTENSION, BENIGN ESSENTIAL 11/21/2009  . FATIGUE 11/21/2009    Current Outpatient Medications on File Prior to Visit  Medication Sig Dispense Refill  . ciprofloxacin (CIPRO) 500 MG tablet Take 1 tablet (500 mg total) by mouth 2 (two) times daily. 10 tablet 0  . citalopram (CELEXA) 40 MG tablet Take 1 tablet (40 mg total) by mouth daily. 90 tablet 1  . diclofenac (VOLTAREN) 75 MG EC tablet Take 1 tablet (75 mg total) by mouth 2 (two) times daily. 60 tablet 1  . levothyroxine (SYNTHROID, LEVOTHROID) 150 MCG tablet Take 1 tablet (150 mcg total) by mouth daily. 90 tablet 3  . lisinopril (PRINIVIL,ZESTRIL) 40 MG tablet Take 1 tablet (40 mg total) by mouth daily. 90 tablet 3  . methocarbamol (ROBAXIN) 500 MG tablet Take 1 tablet (500 mg total) by mouth every 6 (six) hours as needed for muscle spasms. 60 tablet 1  . sildenafil (VIAGRA) 100 MG tablet Take 1 tablet (100 mg total) by mouth daily as needed for erectile dysfunction. 10 tablet 11  .  [DISCONTINUED] omeprazole (PRILOSEC) 20 MG capsule Take 1 capsule (20 mg total) by mouth daily. 30 capsule 3   No current facility-administered medications on file prior to visit.     Allergies  Allergen Reactions  . Chantix [Varenicline] Other (See Comments)    Agitation and sleep walking     Objective:  General: Alert and oriented x3 in no acute distress  Dermatology: No open lesions bilateral lower extremities, no webspace macerations, no ecchymosis bilateral, all nails x 10 are well manicured.  Vascular: Focal edema noted to right forefoot. Dorsalis Pedis and Posterior Tibial pedal pulses 2/4, Capillary Fill Time 3 seconds,(+) pedal hair growth bilateral, Temperature gradient within normal limits.  Neurology: Gross sensation intact via light touch on right.   Musculoskeletal: There is tenderness with palpation at Right dorsal forefoot over 2-3 metatarsals like previous. Mild pain and guarding with range of motion on right.     Gait: Unassisted, Antalgic gait, NOT WEARING CAM BOOT  Xrays  Right Foot   Impression: Non-displaced fracture of 2-3 metatarsals 79m gap, mild soft tissue swelling, no other acute findings.    Assessment and Plan: Problem List Items Addressed This Visit    None    Visit Diagnoses    Multiple closed fractures of metatarsal bone of right foot with routine healing, subsequent encounter    -  Primary   Relevant Orders   DG Foot Complete Right (Completed)  Swelling of right foot       Right foot pain          -Complete examination performed -Xrays reviewed -Discussed continued care for fracture; risks, alternatives, and benefits explained. -Applied compression anklet to use daily for edema control  -Recommend continue with CAM walker to patient to wear at all times because this can lead to worsening of fractures if her does not wear the boot; Rx Toe guard for boot -Recommend protection, rest, ice, elevation daily until symptoms improve -Refilled  Norco 10/345m to use after work -Awaiting Exogen bone stim to assist with fracture healing esp in setting of patient being a smoker with history of stress to bone with fracture  -Patient to return to office in 3-4 weeks for serial x-rays to assess healing  or sooner if condition worsens.  TLandis Martins DPM

## 2017-08-27 ENCOUNTER — Telehealth: Payer: Self-pay | Admitting: *Deleted

## 2017-08-27 NOTE — Telephone Encounter (Signed)
I told pt we had the toe shield and I would have [facility] contact him with the status of the Bone Growth Stimulator order. Pt states have [employer] and give the toe shield to her also.

## 2017-08-27 NOTE — Telephone Encounter (Signed)
I informed Lytle Michaels - [employer] of the request to call pt's wife to discuss status of Exogen.

## 2017-08-27 NOTE — Telephone Encounter (Signed)
I gave pt's wife, Leroy Kennedy the toe shield and asked for her contact information to have [facility] contact her.

## 2017-08-27 NOTE — Telephone Encounter (Signed)
-----   Message from Landis Martins, [place] sent at 08/26/2017  5:35 PM EDT ----- Regarding: Bone stim and toe guard for CAM boot Val  Can you update patient on status of bone stim request for his fractures Also can you see if we can order a Toe Guard for his CAM boot so that way he can wear it to work Thanks Dr. Chauncey Cruel

## 2017-08-29 MED FILL — HYDROCODON-APAP 10-325: 10-325 | 8 days supply | Qty: 30 | Fill #0

## 2017-09-03 ENCOUNTER — Telehealth: Payer: Self-pay | Admitting: *Deleted

## 2017-09-03 MED ORDER — HYDROCODONE-ACETAMINOPHEN 10-325 MG PO TABS: 1.0000 | ORAL_TABLET | Freq: Four times a day (QID) | ORAL | 0 refills | Status: DC | PRN

## 2017-09-03 NOTE — Telephone Encounter (Signed)
Dr. Marcene Duos refill of the pt's requested Norco 10/325.

## 2017-09-05 NOTE — Progress Notes (Deleted)
Subjective:    Patient ID: Chad Wilson, male    DOB: 16-Jan-1976, 42 y.o.   MRN: [id number]  HPI  Patient presents for yearly preventative medicine examination. He is a pleasant 42 year old male who  has a past medical history of Acute hepatitis C without mention of hepatic coma(070.51), Alcohol abuse, unspecified, Anxiety states, Essential hypertension, benign, Graves disease, Panic disorder without agoraphobia, Thyroid disease, and Tobacco use disorder.  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 Is prescribed Synthroid 150 mcg  Lab Results  Component Value Date   TSH 5.26 (H) 08/07/2017    Hypertension - well controlled with Lisinopril 40 mg. Denies any hypotensive episodes, dry cough, or facial swelling.  BP Readings from Last 3 Encounters:  08/07/17 132/88  06/12/17 138/84  05/22/17 125/79   Anxiety - Well controlled with Celexa 40 mg   Tobacco Use   All immunizations and health maintenance protocols were reviewed with the patient and needed orders were placed.  Appropriate screening laboratory values were ordered for the patient including screening of hyperlipidemia, renal function and hepatic function. If indicated by BPH, a PSA was ordered.  Medication reconciliation,  past medical history, social history, problem list and allergies were reviewed in detail with the patient  Goals were established with regard to weight loss, exercise, and  diet in compliance with medications  End of life planning was discussed.  Review of Systems  Constitutional: Negative.   HENT: Negative.   Eyes: Negative.   Respiratory: Negative.   Cardiovascular: Negative.   Gastrointestinal: Negative.   Endocrine: Negative.   Genitourinary: Negative.   Musculoskeletal: Negative.   Skin: Negative.   Allergic/Immunologic: Negative.   Neurological: Negative.   Hematological: Negative.   Psychiatric/Behavioral: Negative.   All other systems reviewed and are  negative.  Past Medical History:  Diagnosis Date  . Acute hepatitis C without mention of hepatic coma(070.51)    has been cleared   . Alcohol abuse, unspecified   . Anxiety states   . Essential hypertension, benign   . Graves disease    treated by Dr Loanne Drilling  . Panic disorder without agoraphobia   . Thyroid disease    Graves Disease  . Tobacco use disorder     Social History   Socioeconomic History  . Marital status: Married    Spouse name: Not on file  . Number of children: 0  . Years of education: Not on file  . Highest education level: Not on file  Occupational History    Employer: [employer]  Social Needs  . Financial resource strain: Not on file  . Food insecurity:    Worry: Not on file    Inability: Not on file  . Transportation needs:    Medical: Not on file    Non-medical: Not on file  Tobacco Use  . Smoking status: Current Every Day Smoker    Packs/day: 1.00    Years: 30.00    Pack years: 30.00  . Smokeless tobacco: Current User    Types: Chew  Substance and Sexual Activity  . Alcohol use: Yes    Comment: socially   . Drug use: No    Comment: cocaine  . Sexual activity: Not on file  Lifestyle  . Physical activity:    Days per week: Not on file    Minutes per session: Not on file  . Stress: Not on file  Relationships  . Social connections:    Talks on phone: Not  on file    Gets together: Not on file    Attends religious service: Not on file    Active member of club or organization: Not on file    Attends meetings of clubs or organizations: Not on file    Relationship status: Not on file  . Intimate partner violence:    Fear of current or ex partner: Not on file    Emotionally abused: Not on file    Physically abused: Not on file    Forced sexual activity: Not on file  Other Topics Concern  . Not on file  Social History Narrative   He is working in [employer]    Diet: Fruits and veggies, water poweraid, occ sweet tea     Past Surgical History:  Procedure Laterality Date  . ANTERIOR CERVICAL DECOMP/DISCECTOMY FUSION N/A 05/21/2017   Procedure: Anterior Cervical Decompression Fusion Cervical five-six, Cervical six-seven;  Surgeon: Eustace Moore, MD;  Location: [facility];  Service: Neurosurgery;  Laterality: N/A;  . MULTIPLE TOOTH EXTRACTIONS  15 yrs. ago    Family History  Problem Relation Age of Onset  . Anxiety disorder Father   . Heart disease Father   . Hypertension Father   . Thyroid disease Mother   . Anxiety disorder Sister   . Hyperthyroidism Sister     Allergies  Allergen Reactions  . Chantix [Varenicline] Other (See Comments)    Agitation and sleep walking     Current Outpatient Medications on File Prior to Visit  Medication Sig Dispense Refill  . ciprofloxacin (CIPRO) 500 MG tablet Take 1 tablet (500 mg total) by mouth 2 (two) times daily. 10 tablet 0  . citalopram (CELEXA) 40 MG tablet Take 1 tablet (40 mg total) by mouth daily. 90 tablet 1  . diclofenac (VOLTAREN) 75 MG EC tablet Take 1 tablet (75 mg total) by mouth 2 (two) times daily. 60 tablet 1  . HYDROcodone-acetaminophen (NORCO) 10-325 MG tablet Take 1 tablet by mouth every 6 (six) hours as needed. 30 tablet 0  . HYDROcodone-acetaminophen (NORCO) 10-325 MG tablet Take 1 tablet by mouth every 6 (six) hours as needed. 30 tablet 0  . levothyroxine (SYNTHROID, LEVOTHROID) 150 MCG tablet Take 1 tablet (150 mcg total) by mouth daily. 90 tablet 3  . lisinopril (PRINIVIL,ZESTRIL) 40 MG tablet Take 1 tablet (40 mg total) by mouth daily. 90 tablet 3  . methocarbamol (ROBAXIN) 500 MG tablet Take 1 tablet (500 mg total) by mouth every 6 (six) hours as needed for muscle spasms. 60 tablet 1  . sildenafil (VIAGRA) 100 MG tablet Take 1 tablet (100 mg total) by mouth daily as needed for erectile dysfunction. 10 tablet 11  . [DISCONTINUED] omeprazole (PRILOSEC) 20 MG capsule Take 1 capsule (20 mg total) by mouth daily. 30 capsule 3   No current  facility-administered medications on file prior to visit.     There were no vitals taken for this visit.      Objective:   Physical Exam  Constitutional: He is oriented to person, place, and time. He appears well-developed and well-nourished. No distress.  HENT:  Head: Normocephalic and atraumatic.  Right Ear: External ear normal.  Left Ear: External ear normal.  Nose: Nose normal.  Mouth/Throat: Oropharynx is clear and moist. No oropharyngeal exudate.  Eyes: Pupils are equal, round, and reactive to light. Conjunctivae are normal. Right eye exhibits no discharge. Left eye exhibits no discharge. No scleral icterus.  Neck: No JVD present. No tracheal deviation present. No thyromegaly  present.  Cardiovascular: Normal rate, regular rhythm, normal heart sounds and intact distal pulses. Exam reveals no gallop and no friction rub.  No murmur heard. Pulmonary/Chest: Effort normal and breath sounds normal. No stridor. No respiratory distress. He has no wheezes. He has no rales. He exhibits no tenderness.  Abdominal: Soft. Bowel sounds are normal. He exhibits no distension and no mass. There is no tenderness. There is no rebound and no guarding. No hernia.  Musculoskeletal: Normal range of motion. He exhibits no edema, tenderness or deformity.  Lymphadenopathy:    He has no cervical adenopathy.  Neurological: He is alert and oriented to person, place, and time. He displays normal reflexes. No cranial nerve deficit or sensory deficit. He exhibits normal muscle tone. Coordination normal.  Skin: Skin is warm and dry. Capillary refill takes less than 2 seconds. No rash noted. He is not diaphoretic. No erythema. No pallor.  Psychiatric: He has a normal mood and affect. Judgment and thought content normal.  Nursing note and vitals reviewed.     Assessment & Plan:

## 2017-09-08 ENCOUNTER — Encounter: Payer: 59 | Admitting: Adult Health

## 2017-09-08 DIAGNOSIS — Z0289 Encounter for other administrative examinations: Secondary | ICD-10-CM

## 2017-09-09 ENCOUNTER — Other Ambulatory Visit: Payer: Self-pay | Admitting: Sports Medicine

## 2017-09-09 MED ORDER — HYDROCODONE-ACETAMINOPHEN 10-325 MG PO TABS: 1.0000 | ORAL_TABLET | Freq: Three times a day (TID) | ORAL | 0 refills | Status: DC | PRN

## 2017-09-16 ENCOUNTER — Ambulatory Visit: Payer: 59 | Admitting: Sports Medicine

## 2017-09-19 MED FILL — CITALOPRAM HBR 40 MG TABLET: 40 | 90 days supply | Qty: 90 | Fill #1

## 2017-09-22 MED FILL — SILDENAFIL CITRATE 100 MG T: 100 | 30 days supply | Qty: 6 | Fill #2

## 2017-09-23 ENCOUNTER — Telehealth: Payer: Self-pay | Admitting: *Deleted

## 2017-09-23 ENCOUNTER — Other Ambulatory Visit: Payer: Self-pay | Admitting: Sports Medicine

## 2017-09-23 NOTE — Telephone Encounter (Signed)
Left message informing Chad Wilson - [facility], pt had hydrocodone ordered on 08/12/2017, 09/03/2017 and 09/09/2017.

## 2017-09-23 NOTE — Telephone Encounter (Signed)
Chad Wilson - [facility] request confirmation of dates pt was ordered Hydrocodone 10/325 - 08/15/2017, 09/03/2017, and 09/09/2017.

## 2017-09-25 ENCOUNTER — Telehealth: Payer: Self-pay | Admitting: *Deleted

## 2017-09-25 MED ORDER — HYDROCODONE-ACETAMINOPHEN 10-325 MG PO TABS: 1.0000 | ORAL_TABLET | Freq: Three times a day (TID) | ORAL | 0 refills | Status: DC | PRN

## 2017-09-25 MED ORDER — HYDROCODONE-ACETAMINOPHEN 10-325 MG PO TABS: ORAL_TABLET | ORAL | 0 refills | Status: DC

## 2017-09-25 MED FILL — HYDROCODON-APAP 10-325: 10-325 | 10 days supply | Qty: 30 | Fill #0

## 2017-09-25 NOTE — Telephone Encounter (Signed)
Pt requested refill of pain medication. Prescription given to pt's wife, Alyse Low.

## 2017-09-30 ENCOUNTER — Ambulatory Visit: Payer: 59 | Admitting: Sports Medicine

## 2017-10-03 ENCOUNTER — Other Ambulatory Visit: Payer: Self-pay

## 2017-10-03 ENCOUNTER — Telehealth: Payer: Self-pay | Admitting: *Deleted

## 2017-10-03 MED ORDER — HYDROCODONE-ACETAMINOPHEN 10-325 MG PO TABS: ORAL_TABLET | ORAL | 0 refills | Status: DC

## 2017-10-03 NOTE — Telephone Encounter (Signed)
Pt's wife, Leroy Kennedy states pt request pain medication. Dr. Cannon Kettle ordered refill as previously.

## 2017-10-09 ENCOUNTER — Telehealth: Payer: Self-pay | Admitting: *Deleted

## 2017-10-09 MED ORDER — HYDROCODONE-ACETAMINOPHEN 10-325 MG PO TABS: ORAL_TABLET | ORAL | 0 refills | Status: DC

## 2017-10-09 NOTE — Addendum Note (Signed)
Addended by: Harriett Sine D on: 10/09/2017 03:15 PM   Modules accepted: Orders

## 2017-10-09 NOTE — Telephone Encounter (Signed)
Dr. Marcene Duos the refill fo the [facility].

## 2017-10-10 ENCOUNTER — Telehealth: Payer: Self-pay | Admitting: *Deleted

## 2017-10-10 NOTE — Telephone Encounter (Signed)
I informed pt of Dr. Leeanne Rio orders.

## 2017-10-10 NOTE — Telephone Encounter (Signed)
-----   Message from Chad Wilson, [place] sent at 10/09/2017  5:17 PM EDT ----- Regarding: Med refill Ok'd refill for this time. Patient needs to follow up and see me within 2 weeks I need to follow up with him to make sure fracture is healing and to discuss further his pain med regimen Thanks Dr. Cannon Kettle

## 2017-10-14 ENCOUNTER — Other Ambulatory Visit: Payer: Self-pay | Admitting: Sports Medicine

## 2017-10-14 DIAGNOSIS — S92301D Fracture of unspecified metatarsal bone(s), right foot, subsequent encounter for fracture with routine healing: Secondary | ICD-10-CM

## 2017-10-14 MED ORDER — HYDROCODONE-ACETAMINOPHEN 5-325 MG PO TABS: 1.0000 | ORAL_TABLET | Freq: Four times a day (QID) | ORAL | 0 refills | Status: DC | PRN

## 2017-10-14 NOTE — Progress Notes (Signed)
Change Rx to Norco 5/375m. Wife [place] me that [place] -Dr. SCannon Kettle

## 2017-10-17 ENCOUNTER — Other Ambulatory Visit: Payer: Self-pay | Admitting: Adult Health

## 2017-10-17 ENCOUNTER — Encounter: Payer: Self-pay | Admitting: Adult Health

## 2017-10-17 ENCOUNTER — Ambulatory Visit (INDEPENDENT_AMBULATORY_CARE_PROVIDER_SITE_OTHER): Payer: 59 | Admitting: Adult Health

## 2017-10-17 VITALS — BP 140/84 | Temp 98.3°F | Ht 68.0 in | Wt 186.0 lb

## 2017-10-17 DIAGNOSIS — F419 Anxiety disorder, unspecified: Secondary | ICD-10-CM

## 2017-10-17 DIAGNOSIS — E890 Postprocedural hypothyroidism: Secondary | ICD-10-CM

## 2017-10-17 DIAGNOSIS — F329 Major depressive disorder, single episode, unspecified: Secondary | ICD-10-CM

## 2017-10-17 DIAGNOSIS — F32A Depression, unspecified: Secondary | ICD-10-CM

## 2017-10-17 DIAGNOSIS — Z125 Encounter for screening for malignant neoplasm of prostate: Secondary | ICD-10-CM | POA: Diagnosis not present

## 2017-10-17 DIAGNOSIS — I10 Essential (primary) hypertension: Secondary | ICD-10-CM | POA: Diagnosis not present

## 2017-10-17 DIAGNOSIS — F17200 Nicotine dependence, unspecified, uncomplicated: Secondary | ICD-10-CM | POA: Diagnosis not present

## 2017-10-17 DIAGNOSIS — Z0000 Encounter for general adult medical examination without abnormal findings: Secondary | ICD-10-CM | POA: Diagnosis not present

## 2017-10-17 LAB — CBC WITH DIFFERENTIAL/PLATELET
Basophils Absolute: 0.1 10*3/uL (ref 0.0–0.1)
Basophils Relative: 1 % (ref 0.0–3.0)
EOS PCT: 3.5 % (ref 0.0–5.0)
Eosinophils Absolute: 0.3 10*3/uL (ref 0.0–0.7)
HCT: 44.5 % (ref 39.0–52.0)
HEMOGLOBIN: 15.5 g/dL (ref 13.0–17.0)
LYMPHS PCT: 21 % (ref 12.0–46.0)
Lymphs Abs: 1.6 10*3/uL (ref 0.7–4.0)
MCHC: 34.8 g/dL (ref 30.0–36.0)
MCV: 96.9 fl (ref 78.0–100.0)
MONOS PCT: 12.1 % — AB (ref 3.0–12.0)
Monocytes Absolute: 0.9 10*3/uL (ref 0.1–1.0)
Neutro Abs: 4.9 10*3/uL (ref 1.4–7.7)
Neutrophils Relative %: 62.4 % (ref 43.0–77.0)
Platelets: 239 10*3/uL (ref 150.0–400.0)
RBC: 4.59 Mil/uL (ref 4.22–5.81)
RDW: 14.3 % (ref 11.5–15.5)
WBC: 7.8 10*3/uL (ref 4.0–10.5)

## 2017-10-17 LAB — HEPATIC FUNCTION PANEL
ALBUMIN: 4.7 g/dL (ref 3.5–5.2)
ALT: 25 U/L (ref 0–53)
AST: 16 U/L (ref 0–37)
Alkaline Phosphatase: 89 U/L (ref 39–117)
Bilirubin, Direct: 0.1 mg/dL (ref 0.0–0.3)
Total Bilirubin: 0.3 mg/dL (ref 0.2–1.2)
Total Protein: 7.3 g/dL (ref 6.0–8.3)

## 2017-10-17 LAB — BASIC METABOLIC PANEL
BUN: 21 mg/dL (ref 6–23)
CO2: 26 mEq/L (ref 19–32)
Calcium: 9.8 mg/dL (ref 8.4–10.5)
Chloride: 101 mEq/L (ref 96–112)
Creatinine, Ser: 1.14 mg/dL (ref 0.40–1.50)
GFR: 74.32 mL/min (ref >60.00)
Glucose, Bld: 101 mg/dL — ABNORMAL HIGH (ref 70–99)
Potassium: 4.1 mEq/L (ref 3.5–5.1)
SODIUM: 137 meq/L (ref 135–145)

## 2017-10-17 LAB — LIPID PANEL
Cholesterol: 215 mg/dL — ABNORMAL HIGH (ref 0–200)
HDL: 44.8 mg/dL (ref >39.00)
Total CHOL/HDL Ratio: 5
Triglycerides: 550 mg/dL — ABNORMAL HIGH (ref 0.0–149.0)

## 2017-10-17 LAB — PSA: PSA: 0.5 ng/mL (ref 0.10–4.00)

## 2017-10-17 LAB — HEMOGLOBIN A1C: HEMOGLOBIN A1C: 5.9 % (ref 4.6–6.5)

## 2017-10-17 LAB — LDL CHOLESTEROL, DIRECT: LDL DIRECT: 105 mg/dL

## 2017-10-17 MED ORDER — ALBUTEROL SULFATE HFA 108 (90 BASE) MCG/ACT IN AERS: 2.0000 | INHALATION_SPRAY | Freq: Four times a day (QID) | RESPIRATORY_TRACT | 2 refills | Status: DC | PRN

## 2017-10-17 MED ORDER — FLUOXETINE HCL 40 MG PO CAPS: 40.0000 mg | ORAL_CAPSULE | Freq: Every day | ORAL | 0 refills | Status: DC

## 2017-10-17 MED FILL — VENTOLIN HFA 90 MCG INHALER: 108 (90 BAS | 25 days supply | Qty: 18 | Fill #0

## 2017-10-17 MED FILL — FLUoxetine HCL 40 MG CAPS: 40 | 90 days supply | Qty: 90 | Fill #0

## 2017-10-17 NOTE — Progress Notes (Signed)
Subjective:    Patient ID: Chad Wilson, male    DOB: 02-20-1976, 42 y.o.   MRN: [id number]  HPI  Patient presents for yearly preventative medicine examination. He is a very pleasant 42 year old male who  has a past medical history of Acute hepatitis C without mention of hepatic coma(070.51), Alcohol abuse, unspecified, Anxiety states, Essential hypertension, benign, Graves disease, Panic disorder without agoraphobia, Thyroid disease, and Tobacco use disorder.  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 Loanne Drilling.  Only prescribed Synthroid 150 mcg.  Patient  endorses feeling well  Anxiety -feels as though his anxiety has been worse as of lately. He finds himself going into stores such as [employer] or [employer] and he has a panic attack and will have to return to his car.   Hypertension - Controlled on lisinopril 40 mg   Tobacco Use - he continues to smoke about a pack a day.  We have tried Chantix on him in the past and he did issues with agitation and sleepwalking  All immunizations and health maintenance protocols were reviewed with the patient and needed orders were placed. He is UTD on vaccinations   Appropriate screening laboratory values were ordered for the patient including screening of hyperlipidemia, renal function and hepatic function. If indicated by BPH, a PSA was ordered.  Medication reconciliation,  past medical history, social history, problem list and allergies were reviewed in detail with the patient  Goals were established with regard to weight loss, exercise, and  diet in compliance with medications.  He stays active at work doing Architect.  Does not focus on a heart healthy diet  Wt Readings from Last 3 Encounters:  10/17/17 186 lb (84.4 kg)  08/07/17 187 lb 3.2 oz (84.9 kg)  06/12/17 187 lb 3.2 oz (84.9 kg)    Review of Systems  Constitutional: Negative.   HENT: Negative.   Eyes: Negative.   Respiratory:  Negative.   Cardiovascular: Negative.   Gastrointestinal: Negative.   Endocrine: Negative.   Genitourinary: Negative.   Musculoskeletal: Negative.   Skin: Negative.   Allergic/Immunologic: Negative.   Neurological: Negative.   Hematological: Negative.   Psychiatric/Behavioral: The patient is nervous/anxious.   All other systems reviewed and are negative.  Past Medical History:  Diagnosis Date  . Acute hepatitis C without mention of hepatic coma(070.51)    has been cleared   . Alcohol abuse, unspecified   . Anxiety states   . Essential hypertension, benign   . Graves disease    treated by Dr Loanne Drilling  . Panic disorder without agoraphobia   . Thyroid disease    Graves Disease  . Tobacco use disorder     Social History   Socioeconomic History  . Marital status: Married    Spouse name: Not on file  . Number of children: 0  . Years of education: Not on file  . Highest education level: Not on file  Occupational History    Employer: [employer]  Social Needs  . Financial resource strain: Not on file  . Food insecurity:    Worry: Not on file    Inability: Not on file  . Transportation needs:    Medical: Not on file    Non-medical: Not on file  Tobacco Use  . Smoking status: Current Every Day Smoker    Packs/day: 1.00    Years: 30.00    Pack years: 30.00  . Smokeless tobacco: Current User  Types: Chew  Substance and Sexual Activity  . Alcohol use: Yes    Comment: socially   . Drug use: No    Comment: cocaine  . Sexual activity: Not on file  Lifestyle  . Physical activity:    Days per week: Not on file    Minutes per session: Not on file  . Stress: Not on file  Relationships  . Social connections:    Talks on phone: Not on file    Gets together: Not on file    Attends religious service: Not on file    Active member of club or organization: Not on file    Attends meetings of clubs or organizations: Not on file    Relationship status: Not on  file  . Intimate partner violence:    Fear of current or ex partner: Not on file    Emotionally abused: Not on file    Physically abused: Not on file    Forced sexual activity: Not on file  Other Topics Concern  . Not on file  Social History Narrative   He is working in Architect    Diet: Fruits and veggies, water poweraid, occ sweet tea    Past Surgical History:  Procedure Laterality Date  . ANTERIOR CERVICAL DECOMP/DISCECTOMY FUSION N/A 05/21/2017   Procedure: Anterior Cervical Decompression Fusion Cervical five-six, Cervical six-seven;  Surgeon: Eustace Moore, MD;  Location: [place];  Service: Neurosurgery;  Laterality: N/A;  . MULTIPLE TOOTH EXTRACTIONS  15 yrs. ago    Family History  Problem Relation Age of Onset  . Anxiety disorder Father   . Heart disease Father   . Hypertension Father   . Thyroid disease Mother   . Anxiety disorder Sister   . Hyperthyroidism Sister     Allergies  Allergen Reactions  . Chantix [Varenicline] Other (See Comments)    Agitation and sleep walking     Current Outpatient Medications on File Prior to Visit  Medication Sig Dispense Refill  . citalopram (CELEXA) 40 MG tablet Take 1 tablet (40 mg total) by mouth daily. 90 tablet 1  . HYDROcodone-acetaminophen (NORCO) 10-325 MG tablet Take one tablet every 8 hours prn foot pain. 30 tablet 0  . levothyroxine (SYNTHROID, LEVOTHROID) 150 MCG tablet Take 1 tablet (150 mcg total) by mouth daily. 90 tablet 3  . lisinopril (PRINIVIL,ZESTRIL) 40 MG tablet Take 1 tablet (40 mg total) by mouth daily. 90 tablet 3  . sildenafil (VIAGRA) 100 MG tablet Take 1 tablet (100 mg total) by mouth daily as needed for erectile dysfunction. 10 tablet 11  . [DISCONTINUED] omeprazole (PRILOSEC) 20 MG capsule Take 1 capsule (20 mg total) by mouth daily. 30 capsule 3   No current facility-administered medications on file prior to visit.     BP 140/84   Temp 98.3 F (36.8 C)   Ht 5' 8"  (1.727 m)   Wt 186 lb (84.4  kg)   BMI 28.28 kg/m       Objective:   Physical Exam  Constitutional: He is oriented to person, place, and time. He appears well-developed and well-nourished. No distress.  HENT:  Head: Normocephalic and atraumatic.  Right Ear: External ear normal.  Left Ear: External ear normal.  Nose: Nose normal.  Mouth/Throat: Oropharynx is clear and moist. No oropharyngeal exudate.  Eyes: Pupils are equal, round, and reactive to light. Conjunctivae and EOM are normal. Right eye exhibits no discharge. Left eye exhibits no discharge. No scleral icterus.  Neck: Normal range of  motion. Neck supple. No JVD present. No tracheal deviation present. No thyromegaly present.  Cardiovascular: Normal rate, regular rhythm, normal heart sounds and intact distal pulses. Exam reveals no gallop and no friction rub.  No murmur heard. Pulmonary/Chest: Effort normal and breath sounds normal. No stridor. No respiratory distress. He has no wheezes. He has no rales. He exhibits no tenderness.  Abdominal: Soft. Bowel sounds are normal. He exhibits no distension and no mass. There is no tenderness. There is no rebound and no guarding. No hernia.  Genitourinary:  Genitourinary Comments: Will do PSA  Musculoskeletal: Normal range of motion. He exhibits no edema, tenderness or deformity.  Lymphadenopathy:    He has no cervical adenopathy.  Neurological: He is alert and oriented to person, place, and time. He displays normal reflexes. No cranial nerve deficit or sensory deficit. He exhibits normal muscle tone. Coordination normal.  Skin: Skin is warm and dry. No rash noted. He is not diaphoretic. No erythema. No pallor.  Psychiatric: He has a normal mood and affect. His behavior is normal. Judgment and thought content normal.  Nursing note and vitals reviewed.     Assessment & Plan:  1. Routine general medical examination at a health care facility - Follow up in one year or sooner if needed - Encouraged to quit smoking,  eat a heart healthy diet and exercise  - Basic metabolic panel - CBC with Differential/Platelet - Hepatic function panel - Lipid panel - Hemoglobin A1c  2. Anxiety and depression - Will change Celexa to Prozac to see if this helps  - FLUoxetine (PROZAC) 40 MG capsule; Take 1 capsule (40 mg total) by mouth daily.  Dispense: 90 capsule; Refill: 0  3. TOBACCO ABUSE - Encouraged to quit smoking. Check out [email or web address] to resources.   4. HYPERTENSION, BENIGN ESSENTIAL - Controlled.  - Basic metabolic panel - CBC with Differential/Platelet - Hepatic function panel - Lipid panel  5. Postablative hypothyroidism - Continue with POC by endocrinology   6. Prostate cancer screening  - PSA  Dorothyann Peng, NP

## 2017-10-17 NOTE — Patient Instructions (Signed)
It was great seeing you today   I am going to change your anxiety medication from Celexa to Prozac. Please let me know in a month if this is no better  I will follow up with you regarding your blood work   Please call  [phone]-[email or web address] [phone]). [email or web address]

## 2017-10-20 MED FILL — SILDENAFIL CITRATE 100 MG T: 100 | 30 days supply | Qty: 6 | Fill #3

## 2017-10-20 MED FILL — LISINOPRIL 40 MG TABLET: 40 | 90 days supply | Qty: 90 | Fill #1

## 2017-10-24 ENCOUNTER — Other Ambulatory Visit: Payer: Self-pay | Admitting: Family Medicine

## 2017-10-24 MED ORDER — ATORVASTATIN CALCIUM 20 MG PO TABS: 20.0000 mg | ORAL_TABLET | Freq: Every day | ORAL | 1 refills | Status: DC

## 2017-10-24 MED FILL — ATORVASTATIN CALCIUM 20 MG: 20 | 90 days supply | Qty: 90 | Fill #0

## 2017-10-29 ENCOUNTER — Other Ambulatory Visit: Payer: Self-pay

## 2017-10-29 MED ORDER — HYDROCODONE-ACETAMINOPHEN 10-325 MG PO TABS: ORAL_TABLET | ORAL | 0 refills | Status: DC

## 2017-11-04 ENCOUNTER — Other Ambulatory Visit: Payer: Self-pay

## 2017-11-04 MED ORDER — HYDROCODONE-ACETAMINOPHEN 5-325 MG PO TABS: 1.0000 | ORAL_TABLET | Freq: Three times a day (TID) | ORAL | 0 refills | Status: DC

## 2017-11-04 MED FILL — LEVOTHYROXINE 150 MCG TAB: 150 | 90 days supply | Qty: 90 | Fill #1

## 2017-11-11 ENCOUNTER — Other Ambulatory Visit: Payer: Self-pay

## 2017-11-11 MED ORDER — HYDROCODONE-ACETAMINOPHEN 10-325 MG PO TABS: 1.0000 | ORAL_TABLET | Freq: Three times a day (TID) | ORAL | 0 refills | Status: DC | PRN

## 2017-11-14 MED FILL — SILDENAFIL CITRATE 100 MG T: 100 | 30 days supply | Qty: 6 | Fill #4

## 2017-11-18 ENCOUNTER — Encounter: Payer: Self-pay | Admitting: Sports Medicine

## 2017-11-18 ENCOUNTER — Ambulatory Visit (INDEPENDENT_AMBULATORY_CARE_PROVIDER_SITE_OTHER): Payer: 59 | Admitting: Sports Medicine

## 2017-11-18 ENCOUNTER — Ambulatory Visit (INDEPENDENT_AMBULATORY_CARE_PROVIDER_SITE_OTHER): Payer: 59

## 2017-11-18 DIAGNOSIS — M7989 Other specified soft tissue disorders: Secondary | ICD-10-CM | POA: Diagnosis not present

## 2017-11-18 DIAGNOSIS — M79671 Pain in right foot: Secondary | ICD-10-CM | POA: Diagnosis not present

## 2017-11-18 DIAGNOSIS — S92301D Fracture of unspecified metatarsal bone(s), right foot, subsequent encounter for fracture with routine healing: Secondary | ICD-10-CM | POA: Diagnosis not present

## 2017-11-18 DIAGNOSIS — M5410 Radiculopathy, site unspecified: Secondary | ICD-10-CM

## 2017-11-18 MED ORDER — HYDROCODONE-ACETAMINOPHEN 10-325 MG PO TABS: 1.0000 | ORAL_TABLET | Freq: Three times a day (TID) | ORAL | 0 refills | Status: DC | PRN

## 2017-11-18 MED ORDER — GABAPENTIN 300 MG PO CAPS: 300.0000 mg | ORAL_CAPSULE | Freq: Every day | ORAL | 3 refills | Status: DC

## 2017-11-18 NOTE — Progress Notes (Signed)
Subjective: Chad Wilson is a 42 y.o. male patient who returns to office for follow up evaluation of Right foot pain secondary to met fractures. Patient reports flares of pain worse in back and reports sharp pain to tips of toes that is worse in AM and stiffness at night. Reports that his pain medication helps and that he is back in a normal tennis but notices his toes on right are curling more and he feels like his foot is dropping and he is getting weak/tripping. Patient denies any other pedal complaints.    Patient Active Problem List   Diagnosis Date Noted  . 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 S/P cervical spinal fusion 05/21/2017  . Hypothyroidism 04/14/2017  . Chronic midline low back pain with bilateral sciatica 12/31/2016  . Neck pain 09/06/2011  . Cervical radiculopathy at C6 09/06/2011  . JAUNDICE 05/21/2010  . HEPATITIS C 11/21/2009  . Anxiety and depression 11/21/2009  . PANIC ATTACK 11/21/2009  . Alcohol abuse 11/21/2009  . TOBACCO ABUSE 11/21/2009  . HYPERTENSION, BENIGN ESSENTIAL 11/21/2009  . FATIGUE 11/21/2009    Current Outpatient Medications on File Prior to Visit  Medication Sig Dispense Refill  . ciprofloxacin (CIPRO) 500 MG tablet Take 1 tablet (500 mg total) by mouth 2 (two) times daily. 10 tablet 0  . citalopram (CELEXA) 40 MG tablet Take 1 tablet (40 mg total) by mouth daily. 90 tablet 1  . diclofenac (VOLTAREN) 75 MG EC tablet Take 1 tablet (75 mg total) by mouth 2 (two) times daily. 60 tablet 1  . levothyroxine (SYNTHROID, LEVOTHROID) 150 MCG tablet Take 1 tablet (150 mcg total) by mouth daily. 90 tablet 3  . lisinopril (PRINIVIL,ZESTRIL) 40 MG tablet Take 1 tablet (40 mg total) by mouth daily. 90 tablet 3  . methocarbamol (ROBAXIN) 500 MG tablet Take 1 tablet (500 mg total) by mouth every 6 (six) hours as needed for muscle spasms. 60 tablet 1  . sildenafil (VIAGRA) 100 MG tablet Take 1 tablet (100 mg total)  by mouth daily as needed for erectile dysfunction. 10 tablet 11  . [DISCONTINUED] omeprazole (PRILOSEC) 20 MG capsule Take 1 capsule (20 mg total) by mouth daily. 30 capsule 3   No current facility-administered medications on file prior to visit.     Allergies  Allergen Reactions  . Chantix [Varenicline] Other (See Comments)    Agitation and sleep walking     Objective:  General: Alert and oriented x3 in no acute distress  Dermatology: No open lesions bilateral lower extremities, no webspace macerations, no ecchymosis bilateral, all nails x 10 are well manicured.  Vascular: Minimal edema noted to right forefoot. Dorsalis Pedis and Posterior Tibial pedal pulses 2/4, Capillary Fill Time 3 seconds,(+) pedal hair growth bilateral, Temperature gradient within normal limits.  Neurology: Gross sensation intact via light touch on right.   Musculoskeletal: There is minimal tenderness with palpation at Right dorsal forefoot over 2-3 metatarsals. Mild pain and guarding with range of motion on right. Subjective foot drop/tripping on right  Gait: Unassisted, Minimally Antalgic gait, NOT WEARING CAM BOOT  Xrays  Right Foot   Impression: Healed over/Callused Non-displaced fracture of 2-3 metatarsals resolved 74m gap, prematurely healed, mild soft tissue swelling, no other acute findings.    Assessment and Plan: Problem List Items Addressed This Visit    None    Visit Diagnoses    Multiple closed fractures of metatarsal bone of right foot with routine healing, subsequent encounter    -  Primary   Relevant Orders   DG Foot Complete Right (Completed)   Swelling of right foot       Right foot pain          -Complete examination performed -Xrays reviewed -Discussed continued care for prematurely healed fractures; risks, alternatives, and benefits explained. -May continue with stiff sole tennis shoe since fractures are healed -Recommend patient to have his BACK evaluated because he could be  suffering from R-sided compression that is causing the changes to toes with instability and foot drop -Refilled Norco 10/350m to use after work -Rx Gabapentin to take at bedtime for sharp pain to toes -Patient to return to office PRN.   TLandis Martins DPM

## 2017-11-19 MED FILL — HYDROCODON-APAP 10-325: 10-325 | 7 days supply | Qty: 21 | Fill #0

## 2017-11-19 MED FILL — GABAPENTIN 300 MG CAPSULE: 300 | 90 days supply | Qty: 90 | Fill #0

## 2017-11-20 MED FILL — VENTOLIN HFA 90 MCG INHALER: 108 (90 BAS | 25 days supply | Qty: 18 | Fill #1

## 2017-11-21 ENCOUNTER — Ambulatory Visit (INDEPENDENT_AMBULATORY_CARE_PROVIDER_SITE_OTHER): Payer: 59 | Admitting: Endocrinology

## 2017-11-21 ENCOUNTER — Encounter: Payer: Self-pay | Admitting: Endocrinology

## 2017-11-21 VITALS — BP 132/80 | HR 74 | Ht 68.0 in | Wt 188.4 lb

## 2017-11-21 DIAGNOSIS — E890 Postprocedural hypothyroidism: Secondary | ICD-10-CM

## 2017-11-21 LAB — TSH: TSH: 2.32 u[IU]/mL (ref 0.35–4.50)

## 2017-11-21 LAB — T4, FREE: Free T4: 0.93 ng/dL (ref 0.60–1.60)

## 2017-11-21 NOTE — Patient Instructions (Addendum)
blood tests are requested for you today.  We'll let you know about the results. I would be happy to see you back here as needed.

## 2017-11-21 NOTE — Progress Notes (Signed)
Subjective:    Patient ID: Chad Wilson, male    DOB: Aug 03, 1975, 42 y.o.   MRN: [id number]  HPI Pt returns for f/u of post-RAI hypothyroidism (due to [place]; dx'ed 2017; he took tapazole, but this was stopped for RAI, which he had in 2/18 and again in 10/18; he started Synthroid in Nov of 2018).  pt states he feels well in general.  He takes synthroid as rx'ed--says he never misses.   Past Medical History:  Diagnosis Date  . Acute hepatitis C without mention of hepatic coma(070.51)    has been cleared   . Alcohol abuse, unspecified   . Anxiety states   . Essential hypertension, benign   . Graves disease    treated by Dr Loanne Drilling  . Panic disorder without agoraphobia   . Thyroid disease    Graves Disease  . Tobacco use disorder     Past Surgical History:  Procedure Laterality Date  . ANTERIOR CERVICAL DECOMP/DISCECTOMY FUSION N/A 05/21/2017   Procedure: Anterior Cervical Decompression Fusion Cervical five-six, Cervical six-seven;  Surgeon: Eustace Moore, MD;  Location: [place];  Service: Neurosurgery;  Laterality: N/A;  . MULTIPLE TOOTH EXTRACTIONS  15 yrs. ago    Social History   Socioeconomic History  . Marital status: Married    Spouse name: Not on file  . Number of children: 0  . Years of education: Not on file  . Highest education level: Not on file  Occupational History    Employer: [employer]  Social Needs  . Financial resource strain: Not on file  . Food insecurity:    Worry: Not on file    Inability: Not on file  . Transportation needs:    Medical: Not on file    Non-medical: Not on file  Tobacco Use  . Smoking status: Current Every Day Smoker    Packs/day: 1.00    Years: 30.00    Pack years: 30.00  . Smokeless tobacco: Current User    Types: Chew  Substance and Sexual Activity  . Alcohol use: Yes    Comment: socially   . Drug use: No    Comment: cocaine  . Sexual activity: Not on file  Lifestyle  . Physical activity:    Days  per week: Not on file    Minutes per session: Not on file  . Stress: Not on file  Relationships  . Social connections:    Talks on phone: Not on file    Gets together: Not on file    Attends religious service: Not on file    Active member of club or organization: Not on file    Attends meetings of clubs or organizations: Not on file    Relationship status: Not on file  . Intimate partner violence:    Fear of current or ex partner: Not on file    Emotionally abused: Not on file    Physically abused: Not on file    Forced sexual activity: Not on file  Other Topics Concern  . Not on file  Social History Narrative   He is working in Architect    Diet: Fruits and veggies, water poweraid, occ sweet tea    Current Outpatient Medications on File Prior to Visit  Medication Sig Dispense Refill  . albuterol (PROVENTIL HFA;VENTOLIN HFA) 108 (90 Base) MCG/ACT inhaler Inhale 2 puffs into the lungs every 6 (six) hours as needed for wheezing or shortness of breath. 1 Inhaler 2  . atorvastatin (  LIPITOR) 20 MG tablet Take 1 tablet (20 mg total) by mouth daily. 90 tablet 1  . FLUoxetine (PROZAC) 40 MG capsule Take 1 capsule (40 mg total) by mouth daily. 90 capsule 0  . gabapentin (NEURONTIN) 300 MG capsule Take 1 capsule (300 mg total) by mouth at bedtime. 90 capsule 3  . HYDROcodone-acetaminophen (NORCO) 10-325 MG tablet Take 1 tablet by mouth every 8 (eight) hours as needed. 21 tablet 0  . levothyroxine (SYNTHROID, LEVOTHROID) 150 MCG tablet Take 1 tablet (150 mcg total) by mouth daily. 90 tablet 3  . lisinopril (PRINIVIL,ZESTRIL) 40 MG tablet Take 1 tablet (40 mg total) by mouth daily. 90 tablet 3  . sildenafil (VIAGRA) 100 MG tablet Take 1 tablet (100 mg total) by mouth daily as needed for erectile dysfunction. 10 tablet 11  . [DISCONTINUED] omeprazole (PRILOSEC) 20 MG capsule Take 1 capsule (20 mg total) by mouth daily. 30 capsule 3   No current facility-administered medications on file prior  to visit.     Allergies  Allergen Reactions  . Chantix [Varenicline] Other (See Comments)    Agitation and sleep walking     Family History  Problem Relation Age of Onset  . Anxiety disorder Father   . Heart disease Father   . Hypertension Father   . Thyroid disease Mother   . Anxiety disorder Sister   . Hyperthyroidism Sister     BP 132/80   Pulse 74   Ht 5' 8"  (1.727 m)   Wt 188 lb 6.4 oz (85.5 kg)   SpO2 95%   BMI 28.65 kg/m    Review of Systems He has gained weight    Objective:   Physical Exam VITAL SIGNS:  See vs page.  GENERAL: no distress.  eyes: slight bilat periorbital swelling and proptosis.   Neck: a healed scar is present.  I do not appreciate a nodule in the thyroid or elsewhere in the neck, but the thyroid still seems slightly enlarged on the right.      Assessment & Plan:  Post-RAI hypothyroidism: well-replaced Grave's Dz: persistently palpable thyroid tissue, and this replacement dosage suggests incomplete thyroid ablation by RAI, so we'll follow.   Patient Instructions  blood tests are requested for you today.  We'll let you know about the results. I would be happy to see you back here as needed.

## 2017-12-01 ENCOUNTER — Other Ambulatory Visit: Payer: Self-pay

## 2017-12-01 DIAGNOSIS — M7989 Other specified soft tissue disorders: Secondary | ICD-10-CM

## 2017-12-01 DIAGNOSIS — S92301D Fracture of unspecified metatarsal bone(s), right foot, subsequent encounter for fracture with routine healing: Secondary | ICD-10-CM

## 2017-12-01 DIAGNOSIS — M79671 Pain in right foot: Secondary | ICD-10-CM

## 2017-12-01 DIAGNOSIS — M5410 Radiculopathy, site unspecified: Secondary | ICD-10-CM

## 2017-12-01 MED ORDER — HYDROCODONE-ACETAMINOPHEN 10-325 MG PO TABS: 1.0000 | ORAL_TABLET | Freq: Three times a day (TID) | ORAL | 0 refills | Status: DC | PRN

## 2017-12-03 MED FILL — HYDROCODON-APAP 10-325: 10-325 | 7 days supply | Qty: 21 | Fill #0

## 2017-12-08 ENCOUNTER — Other Ambulatory Visit: Payer: Self-pay | Admitting: *Deleted

## 2017-12-08 DIAGNOSIS — M7989 Other specified soft tissue disorders: Secondary | ICD-10-CM

## 2017-12-08 DIAGNOSIS — S92301D Fracture of unspecified metatarsal bone(s), right foot, subsequent encounter for fracture with routine healing: Secondary | ICD-10-CM

## 2017-12-08 DIAGNOSIS — M79671 Pain in right foot: Secondary | ICD-10-CM

## 2017-12-08 DIAGNOSIS — M5410 Radiculopathy, site unspecified: Secondary | ICD-10-CM

## 2017-12-08 MED ORDER — HYDROCODONE-ACETAMINOPHEN 10-325 MG PO TABS: 1.0000 | ORAL_TABLET | Freq: Four times a day (QID) | ORAL | 0 refills | Status: DC | PRN

## 2017-12-15 ENCOUNTER — Other Ambulatory Visit: Payer: Self-pay

## 2017-12-15 DIAGNOSIS — S92301D Fracture of unspecified metatarsal bone(s), right foot, subsequent encounter for fracture with routine healing: Secondary | ICD-10-CM

## 2017-12-15 DIAGNOSIS — M7989 Other specified soft tissue disorders: Secondary | ICD-10-CM

## 2017-12-15 DIAGNOSIS — M5410 Radiculopathy, site unspecified: Secondary | ICD-10-CM

## 2017-12-15 DIAGNOSIS — M79671 Pain in right foot: Secondary | ICD-10-CM

## 2017-12-15 MED ORDER — HYDROCODONE-ACETAMINOPHEN 10-325 MG PO TABS: 1.0000 | ORAL_TABLET | Freq: Four times a day (QID) | ORAL | 0 refills | Status: DC | PRN

## 2017-12-19 ENCOUNTER — Encounter: Payer: Self-pay | Admitting: Adult Health

## 2017-12-19 NOTE — Telephone Encounter (Signed)
I spoke to the pt.  He states that he feels that Prozac is not working.  He states that he has been taking everyday in the morning.  Not seeing any results.  Still feeling very anxious and feels like his BP is elevated.  Wanted to remind Tommi Rumps that he has tried and failed Prozac in the past.  Is willing to be referred if necessary.  Scheduled on 12/23/17 @ 9:30.  Will forward to [facility] as [facility].

## 2017-12-22 MED FILL — SILDENAFIL CITRATE 100 MG T: 100 | 30 days supply | Qty: 6 | Fill #5

## 2017-12-23 ENCOUNTER — Ambulatory Visit: Payer: 59 | Admitting: Adult Health

## 2017-12-23 NOTE — Progress Notes (Deleted)
Subjective:    Patient ID: Chad Wilson, male    DOB: 1975/07/29, 42 y.o.   MRN: [id number]  HPI 42 year old male who  has a past medical history of Acute hepatitis C without mention of hepatic coma(070.51), Alcohol abuse, unspecified, Anxiety states, Essential hypertension, benign, Graves disease, Panic disorder without agoraphobia, Thyroid disease, and Tobacco use disorder.  He presents to the office today for follow-up regarding anxiety.  Back in August during his CPE he expressed concern that he thought that his anxiety was becoming worse.  At this time he had been on Celexa is maintained pretty well, he found that going into stores such as [employer] or [employer] causing to have a panic attack and he have to go back out to the car.  Celexa was changed to Prozac.  Today in the office he reports that his anxiety is no better and that he continues to have panic attacks  Review of Systems See HPI   Past Medical History:  Diagnosis Date  . Acute hepatitis C without mention of hepatic coma(070.51)    has been cleared   . Alcohol abuse, unspecified   . Anxiety states   . Essential hypertension, benign   . Graves disease    treated by Dr Loanne Drilling  . Panic disorder without agoraphobia   . Thyroid disease    Graves Disease  . Tobacco use disorder     Social History   Socioeconomic History  . Marital status: Married    Spouse name: Not on file  . Number of children: 0  . Years of education: Not on file  . Highest education level: Not on file  Occupational History    Employer: [employer]  Social Needs  . Financial resource strain: Not on file  . Food insecurity:    Worry: Not on file    Inability: Not on file  . Transportation needs:    Medical: Not on file    Non-medical: Not on file  Tobacco Use  . Smoking status: Current Every Day Smoker    Packs/day: 1.00    Years: 30.00    Pack years: 30.00  . Smokeless tobacco: Current User    Types: Chew    Substance and Sexual Activity  . Alcohol use: Yes    Comment: socially   . Drug use: No    Comment: cocaine  . Sexual activity: Not on file  Lifestyle  . Physical activity:    Days per week: Not on file    Minutes per session: Not on file  . Stress: Not on file  Relationships  . Social connections:    Talks on phone: Not on file    Gets together: Not on file    Attends religious service: Not on file    Active member of club or organization: Not on file    Attends meetings of clubs or organizations: Not on file    Relationship status: Not on file  . Intimate partner violence:    Fear of current or ex partner: Not on file    Emotionally abused: Not on file    Physically abused: Not on file    Forced sexual activity: Not on file  Other Topics Concern  . Not on file  Social History Narrative   He is working in Architect    Diet: Fruits and veggies, water poweraid, occ sweet tea    Past Surgical History:  Procedure Laterality Date  . ANTERIOR CERVICAL DECOMP/DISCECTOMY  FUSION N/A 05/21/2017   Procedure: Anterior Cervical Decompression Fusion Cervical five-six, Cervical six-seven;  Surgeon: Eustace Moore, MD;  Location: [place];  Service: Neurosurgery;  Laterality: N/A;  . MULTIPLE TOOTH EXTRACTIONS  15 yrs. ago    Family History  Problem Relation Age of Onset  . Anxiety disorder Father   . Heart disease Father   . Hypertension Father   . Thyroid disease Mother   . Anxiety disorder Sister   . Hyperthyroidism Sister     Allergies  Allergen Reactions  . Chantix [Varenicline] Other (See Comments)    Agitation and sleep walking     Current Outpatient Medications on File Prior to Visit  Medication Sig Dispense Refill  . albuterol (PROVENTIL HFA;VENTOLIN HFA) 108 (90 Base) MCG/ACT inhaler Inhale 2 puffs into the lungs every 6 (six) hours as needed for wheezing or shortness of breath. 1 Inhaler 2  . atorvastatin (LIPITOR) 20 MG tablet Take 1 tablet (20 mg total) by mouth  daily. 90 tablet 1  . FLUoxetine (PROZAC) 40 MG capsule Take 1 capsule (40 mg total) by mouth daily. 90 capsule 0  . gabapentin (NEURONTIN) 300 MG capsule Take 1 capsule (300 mg total) by mouth at bedtime. 90 capsule 3  . HYDROcodone-acetaminophen (NORCO) 10-325 MG tablet Take 1 tablet by mouth every 6 (six) hours as needed. 30 tablet 0  . levothyroxine (SYNTHROID, LEVOTHROID) 150 MCG tablet Take 1 tablet (150 mcg total) by mouth daily. 90 tablet 3  . lisinopril (PRINIVIL,ZESTRIL) 40 MG tablet Take 1 tablet (40 mg total) by mouth daily. 90 tablet 3  . sildenafil (VIAGRA) 100 MG tablet Take 1 tablet (100 mg total) by mouth daily as needed for erectile dysfunction. 10 tablet 11  . [DISCONTINUED] omeprazole (PRILOSEC) 20 MG capsule Take 1 capsule (20 mg total) by mouth daily. 30 capsule 3   No current facility-administered medications on file prior to visit.     There were no vitals taken for this visit.      Objective:   Physical Exam  Constitutional: He is oriented to person, place, and time. He appears well-developed and well-nourished. No distress.  Cardiovascular: Normal rate, regular rhythm, normal heart sounds and intact distal pulses.  Pulmonary/Chest: Effort normal and breath sounds normal.  Neurological: He is alert and oriented to person, place, and time.  Skin: Skin is warm. He is not diaphoretic.  Psychiatric: He has a normal mood and affect. His behavior is normal. Judgment and thought content normal.  Nursing note and vitals reviewed.         Assessment & Plan:

## 2017-12-24 ENCOUNTER — Ambulatory Visit (INDEPENDENT_AMBULATORY_CARE_PROVIDER_SITE_OTHER): Payer: 59 | Admitting: Adult Health

## 2017-12-24 ENCOUNTER — Encounter: Payer: Self-pay | Admitting: Adult Health

## 2017-12-24 VITALS — BP 138/70 | HR 85 | Temp 98.0°F | Wt 194.4 lb

## 2017-12-24 DIAGNOSIS — F419 Anxiety disorder, unspecified: Secondary | ICD-10-CM | POA: Diagnosis not present

## 2017-12-24 MED ORDER — SERTRALINE HCL 100 MG PO TABS: 100.0000 mg | ORAL_TABLET | Freq: Every day | ORAL | 3 refills | Status: DC

## 2017-12-24 MED FILL — SERTRALINE HCL 100 MG TAB: 100 | 30 days supply | Qty: 30 | Fill #0

## 2017-12-24 NOTE — Progress Notes (Signed)
Subjective:    Patient ID: Chad Wilson, male    DOB: 1975/12/04, 42 y.o.   MRN: [id number]  HPI 42 year old male who  has a past medical history of Acute hepatitis C without mention of hepatic coma(070.51), Alcohol abuse, unspecified, Anxiety states, Essential hypertension, benign, Graves disease, Panic disorder without agoraphobia, Thyroid disease, and Tobacco use disorder.  He presents to the office today for follow up regarding anxiety. When I last saw him two months ago we changed Celexa to Prozac. He reports that Prozac is not working to help his anxiety. He continues to have panic attacks from time time.   Denies SI or depression    Review of Systems See HPI   Past Medical History:  Diagnosis Date  . Acute hepatitis C without mention of hepatic coma(070.51)    has been cleared   . Alcohol abuse, unspecified   . Anxiety states   . Essential hypertension, benign   . Graves disease    treated by Dr Loanne Drilling  . Panic disorder without agoraphobia   . Thyroid disease    Graves Disease  . Tobacco use disorder     Social History   Socioeconomic History  . Marital status: Married    Spouse name: Not on file  . Number of children: 0  . Years of education: Not on file  . Highest education level: Not on file  Occupational History    Employer: [employer]  Social Needs  . Financial resource strain: Not on file  . Food insecurity:    Worry: Not on file    Inability: Not on file  . Transportation needs:    Medical: Not on file    Non-medical: Not on file  Tobacco Use  . Smoking status: Current Every Day Smoker    Packs/day: 1.00    Years: 30.00    Pack years: 30.00  . Smokeless tobacco: Current User    Types: Chew  Substance and Sexual Activity  . Alcohol use: Yes    Comment: socially   . Drug use: No    Comment: cocaine  . Sexual activity: Not on file  Lifestyle  . Physical activity:    Days per week: Not on file    Minutes per session: Not  on file  . Stress: Not on file  Relationships  . Social connections:    Talks on phone: Not on file    Gets together: Not on file    Attends religious service: Not on file    Active member of club or organization: Not on file    Attends meetings of clubs or organizations: Not on file    Relationship status: Not on file  . Intimate partner violence:    Fear of current or ex partner: Not on file    Emotionally abused: Not on file    Physically abused: Not on file    Forced sexual activity: Not on file  Other Topics Concern  . Not on file  Social History Narrative   He is working in Architect    Diet: Fruits and veggies, water poweraid, occ sweet tea    Past Surgical History:  Procedure Laterality Date  . ANTERIOR CERVICAL DECOMP/DISCECTOMY FUSION N/A 05/21/2017   Procedure: Anterior Cervical Decompression Fusion Cervical five-six, Cervical six-seven;  Surgeon: Eustace Moore, MD;  Location: [facility];  Service: Neurosurgery;  Laterality: N/A;  . MULTIPLE TOOTH EXTRACTIONS  15 yrs. ago    Family History  Problem  Relation Age of Onset  . Anxiety disorder Father   . Heart disease Father   . Hypertension Father   . Thyroid disease Mother   . Anxiety disorder Sister   . Hyperthyroidism Sister     Allergies  Allergen Reactions  . Chantix [Varenicline] Other (See Comments)    Agitation and sleep walking     Current Outpatient Medications on File Prior to Visit  Medication Sig Dispense Refill  . albuterol (PROVENTIL HFA;VENTOLIN HFA) 108 (90 Base) MCG/ACT inhaler Inhale 2 puffs into the lungs every 6 (six) hours as needed for wheezing or shortness of breath. 1 Inhaler 2  . atorvastatin (LIPITOR) 20 MG tablet Take 1 tablet (20 mg total) by mouth daily. 90 tablet 1  . gabapentin (NEURONTIN) 300 MG capsule Take 1 capsule (300 mg total) by mouth at bedtime. 90 capsule 3  . HYDROcodone-acetaminophen (NORCO) 10-325 MG tablet Take 1 tablet by mouth every 6 (six) hours as needed. 30  tablet 0  . levothyroxine (SYNTHROID, LEVOTHROID) 150 MCG tablet Take 1 tablet (150 mcg total) by mouth daily. 90 tablet 3  . lisinopril (PRINIVIL,ZESTRIL) 40 MG tablet Take 1 tablet (40 mg total) by mouth daily. 90 tablet 3  . sildenafil (VIAGRA) 100 MG tablet Take 1 tablet (100 mg total) by mouth daily as needed for erectile dysfunction. 10 tablet 11  . [DISCONTINUED] omeprazole (PRILOSEC) 20 MG capsule Take 1 capsule (20 mg total) by mouth daily. 30 capsule 3   No current facility-administered medications on file prior to visit.     BP 138/70 (BP Location: Left Arm, Patient Position: Sitting, Cuff Size: Normal)   Pulse 85   Temp 98 F (36.7 C) (Oral)   Wt 194 lb 6.4 oz (88.2 kg)   SpO2 96%   BMI 29.56 kg/m       Objective:   Physical Exam  Constitutional: He is oriented to person, place, and time. He appears well-developed and well-nourished. No distress.  Cardiovascular: Normal rate, regular rhythm, normal heart sounds and intact distal pulses.  Pulmonary/Chest: Effort normal and breath sounds normal.  Neurological: He is alert and oriented to person, place, and time.  Skin: Skin is warm and dry. He is not diaphoretic.  Psychiatric: He has a normal mood and affect. His behavior is normal. Judgment and thought content normal.  Nursing note and vitals reviewed.     Assessment & Plan:  1. Anxiety - Will d/c Prozac and start Zoloft. Advised to call and schedule an appointment with our therapist Dennison Bulla.  - Follow up in one month. May need to refer to psychiatry for medication management  - sertraline (ZOLOFT) 100 MG tablet; Take 1 tablet (100 mg total) by mouth daily.  Dispense: 30 tablet; Refill: 3   Dorothyann Peng, NP

## 2017-12-26 ENCOUNTER — Other Ambulatory Visit: Payer: Self-pay

## 2017-12-26 MED ORDER — HYDROCODONE-ACETAMINOPHEN 5-325 MG PO TABS: 1.0000 | ORAL_TABLET | Freq: Four times a day (QID) | ORAL | 0 refills | Status: DC | PRN

## 2018-01-08 ENCOUNTER — Encounter: Payer: Self-pay | Admitting: Sports Medicine

## 2018-01-09 ENCOUNTER — Other Ambulatory Visit: Payer: Self-pay | Admitting: Sports Medicine

## 2018-01-09 DIAGNOSIS — S92301D Fracture of unspecified metatarsal bone(s), right foot, subsequent encounter for fracture with routine healing: Secondary | ICD-10-CM

## 2018-01-09 DIAGNOSIS — M5410 Radiculopathy, site unspecified: Secondary | ICD-10-CM

## 2018-01-09 DIAGNOSIS — M7989 Other specified soft tissue disorders: Secondary | ICD-10-CM

## 2018-01-09 DIAGNOSIS — M79671 Pain in right foot: Secondary | ICD-10-CM

## 2018-01-12 ENCOUNTER — Ambulatory Visit: Payer: 59 | Admitting: Podiatry

## 2018-01-12 ENCOUNTER — Ambulatory Visit (INDEPENDENT_AMBULATORY_CARE_PROVIDER_SITE_OTHER): Payer: 59 | Admitting: Podiatry

## 2018-01-12 ENCOUNTER — Ambulatory Visit (INDEPENDENT_AMBULATORY_CARE_PROVIDER_SITE_OTHER): Payer: 59

## 2018-01-12 DIAGNOSIS — S92301D Fracture of unspecified metatarsal bone(s), right foot, subsequent encounter for fracture with routine healing: Secondary | ICD-10-CM | POA: Diagnosis not present

## 2018-01-12 MED ORDER — HYDROCODONE-ACETAMINOPHEN 5-325 MG PO TABS: 1.0000 | ORAL_TABLET | Freq: Four times a day (QID) | ORAL | 0 refills | Status: DC | PRN

## 2018-01-13 NOTE — Telephone Encounter (Signed)
Dr. Cannon Kettle referred pt to pain management through MyChart message response to me. Faxed required form, clinicals and demographics to [facility].

## 2018-01-13 NOTE — Progress Notes (Signed)
   HPI: 42 year old male presenting today with a chief complaint of mild throbbing pain to the lateral left foot and ankle that began two days ago secondary to an injury. He states he fell through a deck causing the pain. Walking and bearing weight increases the pain. He has been taking OTC pain medication for treatment. Patient is here for further evaluation and treatment.   Past Medical History:  Diagnosis Date  . Acute hepatitis C without mention of hepatic coma(070.51)    has been cleared   . Alcohol abuse, unspecified   . Anxiety and depression   . Anxiety states   . Essential hypertension, benign   . Graves disease    treated by Dr Loanne Drilling  . Panic disorder without agoraphobia   . Thyroid disease    Graves Disease  . Tobacco use disorder      Physical Exam: General: The patient is alert and oriented x3 in no acute distress.  Dermatology: Skin is warm, dry and supple bilateral lower extremities. Negative for open lesions or macerations.  Vascular: Palpable pedal pulses bilaterally. No edema or erythema noted. Capillary refill within normal limits.  Neurological: Epicritic and protective threshold grossly intact bilaterally.   Musculoskeletal Exam: Ecchymosis and edema noted to the left lower leg. Range of motion within normal limits to all pedal and ankle joints bilateral. Muscle strength 5/5 in all groups bilateral.   Radiographic Exam:  Normal osseous mineralization. Joint spaces preserved. No fracture/dislocation/boney destruction.    Assessment: 1. Left leg contusion secondary to fall 2. Lumbar radiculopathy    Plan of Care:  1. Patient evaluated. X-Rays reviewed.  2. Appointment with pain management pending.  3. Prescription for Vicodin 5/325 mg provided to patient.  4. Appointment with orthopedic spine specialist on 01/29/18.  5. Return to clinic as needed.       Edrick Kins, DPM [facility]  Dr. Edrick Kins, DPM    [address], [place] [address]                Office [phone]  Fax [phone]

## 2018-01-19 MED FILL — LEVOTHYROXINE 150 MCG TAB: 150 | 90 days supply | Qty: 90 | Fill #2

## 2018-01-19 MED FILL — LISINOPRIL 40 MG TABLET: 40 | 90 days supply | Qty: 90 | Fill #2

## 2018-01-19 MED FILL — ATORVASTATIN CALCIUM 20 MG: 20 | 90 days supply | Qty: 90 | Fill #1

## 2018-01-19 MED FILL — SERTRALINE HCL 100 MG TAB: 100 | 30 days supply | Qty: 30 | Fill #1

## 2018-01-20 NOTE — Telephone Encounter (Signed)
-----   Message from Roetta Sessions sent at 01/19/2018 11:10 AM EST ----- Regarding: declined referral Per clinical review nothing we could offer this patient

## 2018-01-28 ENCOUNTER — Other Ambulatory Visit: Payer: Self-pay | Admitting: Podiatry

## 2018-01-28 MED ORDER — HYDROCODONE-ACETAMINOPHEN 5-325 MG PO TABS: 1.0000 | ORAL_TABLET | Freq: Four times a day (QID) | ORAL | 0 refills | Status: DC | PRN

## 2018-01-28 MED FILL — HYDROCODON-APAP 5-325: 5-325 | 7 days supply | Qty: 30 | Fill #0

## 2018-01-28 NOTE — Progress Notes (Signed)
Pain management

## 2018-01-29 MED FILL — SILDENAFIL CITRATE 100 MG T: 100 | 30 days supply | Qty: 6 | Fill #6

## 2018-02-07 ENCOUNTER — Other Ambulatory Visit: Payer: Self-pay

## 2018-02-07 MED ORDER — HYDROCODONE-ACETAMINOPHEN 5-325 MG PO TABS: 1.0000 | ORAL_TABLET | Freq: Four times a day (QID) | ORAL | 0 refills | Status: DC | PRN

## 2018-02-07 NOTE — Telephone Encounter (Signed)
Patient request refill of Hydrocodone   Per Dr. Amalia Hailey verbal order, ok to refill.   Script has been printed and given to patient

## 2018-02-10 ENCOUNTER — Telehealth: Payer: Self-pay | Admitting: Family Medicine

## 2018-02-10 NOTE — Telephone Encounter (Signed)
Pt notified he needs an appt and scheduled for 02/17/18 @ 1:30.

## 2018-02-10 NOTE — Telephone Encounter (Signed)
His thyroid levels were checked two months ago and were normal.   I would like to have him come in so he can be evaluated further as there may be many other additional causes

## 2018-02-10 NOTE — Telephone Encounter (Signed)
Copied from [facility] [phone]. Topic: General - Other >> Feb 10, 2018 12:51 PM Marin Olp L wrote: Reason for CRM: Patient is feeling tired and thinks it's thyroid related so he'd like Cory Nafziger to order tsh, t3, etc to check thyroid levels. Please call patient to notify when orders are placed.

## 2018-02-10 NOTE — Telephone Encounter (Signed)
Spoke to the pt.  He says he feels tired and "off."  Thinks his thyroid may be out of balance again.  Is taking medication as prescribed.  He was told by Dr. Loanne Drilling to follow up with PCP.  Please advise.

## 2018-02-17 ENCOUNTER — Ambulatory Visit (INDEPENDENT_AMBULATORY_CARE_PROVIDER_SITE_OTHER): Payer: 59 | Admitting: Adult Health

## 2018-02-17 ENCOUNTER — Encounter: Payer: Self-pay | Admitting: Family Medicine

## 2018-02-17 ENCOUNTER — Encounter: Payer: Self-pay | Admitting: Adult Health

## 2018-02-17 VITALS — BP 116/80 | Temp 98.2°F | Wt 195.0 lb

## 2018-02-17 DIAGNOSIS — R5383 Other fatigue: Secondary | ICD-10-CM

## 2018-02-17 LAB — CBC WITH DIFFERENTIAL/PLATELET
Basophils Absolute: 0.1 10*3/uL (ref 0.0–0.1)
Basophils Relative: 0.7 % (ref 0.0–3.0)
Eosinophils Absolute: 0.3 10*3/uL (ref 0.0–0.7)
Eosinophils Relative: 3.4 % (ref 0.0–5.0)
HCT: 43.1 % (ref 39.0–52.0)
HEMOGLOBIN: 15.3 g/dL (ref 13.0–17.0)
LYMPHS ABS: 2.4 10*3/uL (ref 0.7–4.0)
Lymphocytes Relative: 30 % (ref 12.0–46.0)
MCHC: 35.5 g/dL (ref 30.0–36.0)
MCV: 95.4 fl (ref 78.0–100.0)
Monocytes Absolute: 0.8 10*3/uL (ref 0.1–1.0)
Monocytes Relative: 9.9 % (ref 3.0–12.0)
Neutro Abs: 4.5 10*3/uL (ref 1.4–7.7)
Neutrophils Relative %: 56 % (ref 43.0–77.0)
Platelets: 223 10*3/uL (ref 150.0–400.0)
RBC: 4.52 Mil/uL (ref 4.22–5.81)
RDW: 13.8 % (ref 11.5–15.5)
WBC: 8.1 10*3/uL (ref 4.0–10.5)

## 2018-02-17 LAB — BASIC METABOLIC PANEL
BUN: 13 mg/dL (ref 6–23)
CO2: 28 meq/L (ref 19–32)
Calcium: 9.4 mg/dL (ref 8.4–10.5)
Chloride: 101 mEq/L (ref 96–112)
Creatinine, Ser: 1.13 mg/dL (ref 0.40–1.50)
GFR: 74.96 mL/min (ref >60.00)
Glucose, Bld: 112 mg/dL — ABNORMAL HIGH (ref 70–99)
Potassium: 4.2 mEq/L (ref 3.5–5.1)
Sodium: 136 mEq/L (ref 135–145)

## 2018-02-17 LAB — T4, FREE: Free T4: 0.69 ng/dL (ref 0.60–1.60)

## 2018-02-17 LAB — TSH: TSH: 2.98 u[IU]/mL (ref 0.35–4.50)

## 2018-02-17 LAB — T3, FREE: T3, Free: 3.6 pg/mL (ref 2.3–4.2)

## 2018-02-17 LAB — VITAMIN B12: Vitamin B-12: 147 pg/mL — ABNORMAL LOW (ref 211–911)

## 2018-02-17 LAB — VITAMIN D 25 HYDROXY (VIT D DEFICIENCY, FRACTURES): VITD: 21.01 ng/mL — ABNORMAL LOW (ref 30.00–100.00)

## 2018-02-17 NOTE — Progress Notes (Signed)
Subjective:    Patient ID: Chad Wilson, male    DOB: Oct 16, 1975, 42 y.o.   MRN: [id number]  HPI 42 year old male who  has a past medical history of Acute hepatitis C without mention of hepatic coma(070.51), Alcohol abuse, unspecified, Anxiety and depression, Anxiety states, Essential hypertension, benign, Graves disease, Panic disorder without agoraphobia, Thyroid disease, and Tobacco use disorder.  He presents to the office today for an acute issue of fatigue and " feeling off". His symptoms have been present for about a month. He feels as though his symptoms are getting worse as time goes on. He reports " he goes to bed tired and wakes up tired".  He does not snore on a nightly basis but reports " my wife tells me I snore when I am really tired." He denies waking up in the middle of the night gasping for breath.   He denies depression but does suffer from anxiety. He was started on Zoloft during the last visit in October as Prozac was not working well for him and he was still having panic attacks. He has not noticed much change in his anxiety since starting Zoloft 2 months ago.   He does take viagra PRN to help with ED.   Review of Systems See HPI   Past Medical History:  Diagnosis Date  . Acute hepatitis C without mention of hepatic coma(070.51)    has been cleared   . Alcohol abuse, unspecified   . Anxiety and depression   . Anxiety states   . Essential hypertension, benign   . Graves disease    treated by Dr Loanne Drilling  . Panic disorder without agoraphobia   . Thyroid disease    Graves Disease  . Tobacco use disorder     Social History   Socioeconomic History  . Marital status: Married    Spouse name: Not on file  . Number of children: 0  . Years of education: Not on file  . Highest education level: Not on file  Occupational History    Employer: [employer]  Social Needs  . Financial resource strain: Not on file  . Food insecurity:    Worry: Not on  file    Inability: Not on file  . Transportation needs:    Medical: Not on file    Non-medical: Not on file  Tobacco Use  . Smoking status: Current Every Day Smoker    Packs/day: 1.00    Years: 30.00    Pack years: 30.00  . Smokeless tobacco: Current User    Types: Chew  Substance and Sexual Activity  . Alcohol use: Yes    Comment: socially   . Drug use: No    Comment: cocaine  . Sexual activity: Not on file  Lifestyle  . Physical activity:    Days per week: Not on file    Minutes per session: Not on file  . Stress: Not on file  Relationships  . Social connections:    Talks on phone: Not on file    Gets together: Not on file    Attends religious service: Not on file    Active member of club or organization: Not on file    Attends meetings of clubs or organizations: Not on file    Relationship status: Not on file  . Intimate partner violence:    Fear of current or ex partner: Not on file    Emotionally abused: Not on file    Physically  abused: Not on file    Forced sexual activity: Not on file  Other Topics Concern  . Not on file  Social History Narrative   He is working in Architect    Diet: Fruits and veggies, water poweraid, occ sweet tea    Past Surgical History:  Procedure Laterality Date  . ANTERIOR CERVICAL DECOMP/DISCECTOMY FUSION N/A 05/21/2017   Procedure: Anterior Cervical Decompression Fusion Cervical five-six, Cervical six-seven;  Surgeon: Eustace Moore, MD;  Location: [facility];  Service: Neurosurgery;  Laterality: N/A;  . MULTIPLE TOOTH EXTRACTIONS  15 yrs. ago    Family History  Problem Relation Age of Onset  . Anxiety disorder Father   . Heart disease Father   . Hypertension Father   . Thyroid disease Mother   . Anxiety disorder Sister   . Hyperthyroidism Sister     Allergies  Allergen Reactions  . Chantix [Varenicline] Other (See Comments)    Agitation and sleep walking     Current Outpatient Medications on File Prior to Visit    Medication Sig Dispense Refill  . albuterol (PROVENTIL HFA;VENTOLIN HFA) 108 (90 Base) MCG/ACT inhaler Inhale 2 puffs into the lungs every 6 (six) hours as needed for wheezing or shortness of breath. 1 Inhaler 2  . atorvastatin (LIPITOR) 20 MG tablet Take 1 tablet (20 mg total) by mouth daily. 90 tablet 1  . gabapentin (NEURONTIN) 300 MG capsule Take 1 capsule (300 mg total) by mouth at bedtime. 90 capsule 3  . HYDROcodone-acetaminophen (NORCO/VICODIN) 5-325 MG tablet Take 1 tablet by mouth every 6 (six) hours as needed for moderate pain. 30 tablet 0  . levothyroxine (SYNTHROID, LEVOTHROID) 150 MCG tablet Take 1 tablet (150 mcg total) by mouth daily. 90 tablet 3  . lisinopril (PRINIVIL,ZESTRIL) 40 MG tablet Take 1 tablet (40 mg total) by mouth daily. 90 tablet 3  . sertraline (ZOLOFT) 100 MG tablet Take 1 tablet (100 mg total) by mouth daily. 30 tablet 3  . sildenafil (VIAGRA) 100 MG tablet Take 1 tablet (100 mg total) by mouth daily as needed for erectile dysfunction. 10 tablet 11  . [DISCONTINUED] omeprazole (PRILOSEC) 20 MG capsule Take 1 capsule (20 mg total) by mouth daily. 30 capsule 3   No current facility-administered medications on file prior to visit.     BP 116/80   Temp 98.2 F (36.8 C)   Wt 195 lb (88.5 kg)   BMI 29.65 kg/m       Objective:   Physical Exam  Constitutional: He is oriented to person, place, and time. He appears well-developed and well-nourished. No distress.  HENT:  Head: Normocephalic and atraumatic.  Right Ear: External ear normal.  Left Ear: External ear normal.  Nose: Nose normal.  Mouth/Throat: Oropharynx is clear and moist. No oropharyngeal exudate.  Eyes: Pupils are equal, round, and reactive to light. Conjunctivae and EOM are normal. Right eye exhibits no discharge. Left eye exhibits no discharge. No scleral icterus.  Neck: Normal range of motion. Neck supple.  Cardiovascular: Normal rate, regular rhythm, normal heart sounds and intact distal  pulses. Exam reveals no gallop and no friction rub.  No murmur heard. Pulmonary/Chest: Effort normal and breath sounds normal. No stridor. No respiratory distress. He has no wheezes. He has no rales. He exhibits no tenderness.  Abdominal: Soft. Bowel sounds are normal. He exhibits no distension and no mass. There is no tenderness. There is no rebound and no guarding.  Neurological: He is oriented to person, place, and time.  Skin: Skin is warm and dry. Capillary refill takes less than 2 seconds. He is not diaphoretic.  Psychiatric: He has a normal mood and affect. His behavior is normal. Judgment and thought content normal.  Nursing note and vitals reviewed.     Assessment & Plan:  1. Fatigue, unspecified type - Unknown cause. Possibly Zoloft. Need to consider sleep apnea as well. Get check lab work. Consider cutting back Zoloft to 50 mg daily and/or referral to pulmonary  - Advised to try therapy for anxiety. Phone number for [employer] [facility] given  - TSH - T3, Free - T4, Free - CBC with Differential/Platelet - Basic Metabolic Panel - Vitamin D, 25-hydroxy - Vitamin B12 - Testosterone Total,Free,Bio, Males - Iron, TIBC and Ferritin Panel - POC Mono (Epstein Barr Virus)  Dorothyann Peng, NP

## 2018-02-18 LAB — TESTOSTERONE TOTAL,FREE,BIO, MALES
Albumin: 4.3 g/dL (ref 3.6–5.1)
Sex Hormone Binding: 20 nmol/L (ref 10–50)
Testosterone: 147 ng/dL — ABNORMAL LOW (ref 250–827)

## 2018-02-18 LAB — IRON,TIBC AND FERRITIN PANEL
%SAT: 52 % (calc) — ABNORMAL HIGH (ref 20–48)
Ferritin: 387 ng/mL — ABNORMAL HIGH (ref 38–380)
Iron: 151 ug/dL (ref 50–180)
TIBC: 292 mcg/dL (calc) (ref 250–425)

## 2018-02-19 ENCOUNTER — Other Ambulatory Visit: Payer: Self-pay | Admitting: Family Medicine

## 2018-02-19 DIAGNOSIS — R7989 Other specified abnormal findings of blood chemistry: Secondary | ICD-10-CM

## 2018-02-19 NOTE — Progress Notes (Signed)
am

## 2018-02-23 MED FILL — SERTRALINE HCL 100 MG TAB: 100 | 30 days supply | Qty: 30 | Fill #2

## 2018-02-26 ENCOUNTER — Other Ambulatory Visit: Payer: Self-pay | Admitting: Podiatry

## 2018-02-26 MED ORDER — HYDROCODONE-ACETAMINOPHEN 5-325 MG PO TABS: 1.0000 | ORAL_TABLET | Freq: Four times a day (QID) | ORAL | 0 refills | Status: DC | PRN

## 2018-02-26 NOTE — Progress Notes (Signed)
Pain medication prn foot pain.

## 2018-02-27 ENCOUNTER — Other Ambulatory Visit: Payer: Self-pay | Admitting: Podiatry

## 2018-02-27 MED ORDER — HYDROCODONE-ACETAMINOPHEN 5-325 MG PO TABS: 1.0000 | ORAL_TABLET | Freq: Four times a day (QID) | ORAL | 0 refills | Status: DC | PRN

## 2018-02-27 MED FILL — SILDENAFIL CITRATE 100 MG T: 100 | 30 days supply | Qty: 6 | Fill #7

## 2018-03-23 ENCOUNTER — Ambulatory Visit: Payer: 59 | Admitting: Psychology

## 2018-03-26 MED FILL — SERTRALINE HCL 100 MG TAB: 100 | 30 days supply | Qty: 30 | Fill #3

## 2018-03-27 ENCOUNTER — Encounter: Payer: Self-pay | Admitting: Podiatry

## 2018-03-27 ENCOUNTER — Ambulatory Visit (INDEPENDENT_AMBULATORY_CARE_PROVIDER_SITE_OTHER): Payer: 59

## 2018-03-27 ENCOUNTER — Ambulatory Visit (INDEPENDENT_AMBULATORY_CARE_PROVIDER_SITE_OTHER): Payer: 59 | Admitting: Podiatry

## 2018-03-27 ENCOUNTER — Other Ambulatory Visit: Payer: Self-pay | Admitting: Podiatry

## 2018-03-27 DIAGNOSIS — M778 Other enthesopathies, not elsewhere classified: Secondary | ICD-10-CM

## 2018-03-27 DIAGNOSIS — M7751 Other enthesopathy of right foot: Secondary | ICD-10-CM

## 2018-03-27 DIAGNOSIS — M779 Enthesopathy, unspecified: Secondary | ICD-10-CM

## 2018-03-27 DIAGNOSIS — S99921D Unspecified injury of right foot, subsequent encounter: Secondary | ICD-10-CM

## 2018-03-27 MED ORDER — HYDROCODONE-ACETAMINOPHEN 5-325 MG PO TABS: 1.0000 | ORAL_TABLET | Freq: Four times a day (QID) | ORAL | 0 refills | Status: DC | PRN

## 2018-03-27 MED FILL — HYDROCODON-APAP 5-325: 5-325 | 7 days supply | Qty: 30 | Fill #0

## 2018-03-31 MED FILL — SILDENAFIL CITRATE 100 MG T: 100 | 30 days supply | Qty: 6 | Fill #8

## 2018-04-06 DIAGNOSIS — M51379 Other intervertebral disc degeneration, lumbosacral region without mention of lumbar back pain or lower extremity pain: Secondary | ICD-10-CM | POA: Insufficient documentation

## 2018-04-06 DIAGNOSIS — M545 Low back pain: Secondary | ICD-10-CM | POA: Diagnosis not present

## 2018-04-06 DIAGNOSIS — M5137 Other intervertebral disc degeneration, lumbosacral region: Secondary | ICD-10-CM | POA: Insufficient documentation

## 2018-04-06 DIAGNOSIS — M47816 Spondylosis without myelopathy or radiculopathy, lumbar region: Secondary | ICD-10-CM | POA: Insufficient documentation

## 2018-04-06 DIAGNOSIS — M5136 Other intervertebral disc degeneration, lumbar region: Secondary | ICD-10-CM | POA: Diagnosis not present

## 2018-04-07 ENCOUNTER — Encounter: Payer: Self-pay | Admitting: Family Medicine

## 2018-04-07 ENCOUNTER — Ambulatory Visit (INDEPENDENT_AMBULATORY_CARE_PROVIDER_SITE_OTHER): Payer: 59 | Admitting: Family Medicine

## 2018-04-07 ENCOUNTER — Other Ambulatory Visit: Payer: Self-pay

## 2018-04-07 VITALS — BP 136/82 | HR 89 | Temp 98.0°F | Ht 68.0 in | Wt 201.1 lb

## 2018-04-07 DIAGNOSIS — F419 Anxiety disorder, unspecified: Secondary | ICD-10-CM | POA: Diagnosis not present

## 2018-04-07 DIAGNOSIS — G4700 Insomnia, unspecified: Secondary | ICD-10-CM | POA: Diagnosis not present

## 2018-04-07 MED ORDER — LORAZEPAM 0.5 MG PO TABS: 0.5000 mg | ORAL_TABLET | Freq: Three times a day (TID) | ORAL | 1 refills | Status: DC | PRN

## 2018-04-07 NOTE — Progress Notes (Signed)
Subjective:     Patient ID: Chad Wilson, male   DOB: 09/12/1975, 43 y.o.   MRN: [id number]  HPI Patient seen to discuss anxiety issues.  He has a longstanding history of anxiety and is currently on Zoloft 100 mg daily.  He is in the process of trying to get into see psychiatry.  He has his own company which involves laying floors and is staying fairly busy.  He has some work stress but more than anything has unpredictable episodes of anxiety that come on suddenly sometimes during the day.  These are sometimes related to crowds but not consistently.  He has taken Lexapro and Prozac in the past without much success.  He is having frequent breakthrough anxiety symptoms even on sertraline.  No regular alcohol use.  Tries to minimize caffeine use.  Denies depression symptoms.  He has had sleep disturbance intermittently.  Frequently gets 3 to 4 hours sleep at night.  He has questions and concerns about possible posttraumatic stress.  He was in prison for 5 years and thinks some of this is related to that.  He had taken Xanax recently from his mother which apparently helped his severe anxiety symptoms.  He has not had any history of agitation or high energy or other specific concerns for bipolar disorder.  Multiple recent labs significant for low testosterone and also low B12 level 147.  He is taking some oral replacement.  Recent TSH normal.    Past Medical History:  Diagnosis Date  . Acute hepatitis C without mention of hepatic coma(070.51)    has been cleared   . Alcohol abuse, unspecified   . Anxiety and depression   . Anxiety states   . Essential hypertension, benign   . Graves disease    treated by Dr Loanne Drilling  . Panic disorder without agoraphobia   . Thyroid disease    Graves Disease  . Tobacco use disorder    Past Surgical History:  Procedure Laterality Date  . ANTERIOR CERVICAL DECOMP/DISCECTOMY FUSION N/A 05/21/2017   Procedure: Anterior Cervical Decompression Fusion Cervical  five-six, Cervical six-seven;  Surgeon: Eustace Moore, MD;  Location: [facility];  Service: Neurosurgery;  Laterality: N/A;  . MULTIPLE TOOTH EXTRACTIONS  15 yrs. ago    reports that he has been smoking. He has a 30.00 pack-year smoking history. His smokeless tobacco use includes chew. He reports current alcohol use. He reports that he does not use drugs. family history includes Anxiety disorder in his father and sister; Heart disease in his father; Hypertension in his father; Hyperthyroidism in his sister; Thyroid disease in his mother. Allergies  Allergen Reactions  . Chantix [Varenicline] Other (See Comments)    Agitation and sleep walking      Review of Systems  Constitutional: Negative for appetite change and unexpected weight change.  Respiratory: Negative for shortness of breath.   Cardiovascular: Negative for chest pain.  Gastrointestinal: Negative for abdominal pain.  Neurological: Negative for syncope and headaches.  Psychiatric/Behavioral: Positive for sleep disturbance. Negative for agitation, confusion, dysphoric mood, hallucinations and suicidal ideas. The patient is nervous/anxious.        Objective:   Physical Exam Constitutional:      Appearance: Normal appearance.  Cardiovascular:     Rate and Rhythm: Normal rate and regular rhythm.  Pulmonary:     Effort: Pulmonary effort is normal.     Breath sounds: Normal breath sounds.  Musculoskeletal:     Right lower leg: No edema.  Left lower leg: No edema.  Neurological:     General: No focal deficit present.     Mental Status: He is alert.  Psychiatric:        Mood and Affect: Mood normal.        Behavior: Behavior normal.        Thought Content: Thought content normal.        Assessment:     #1 Patient has longstanding history of anxiety-questionable mixed type with possible elements of social anxiety as well as possible posttraumatic stress.  #2 Chronic insomnia    Plan:     -Discussed sleep  hygiene -avoid caffeine after 2 pm. -We recommended consideration for counseling to try to help with his anxiety symptoms and stress management. -Continue sertraline 100 mg daily -They will continue to pursue psychiatry follow-up -We have written for very limited lorazepam 0.5 mg 1 every 8 hours but take only as needed for severe breakthrough anxiety symptoms #30..  We discussed avoiding regular use and also risk of habituation and abuse.  They understand  Eulas Post MD [facility]

## 2018-04-07 NOTE — Progress Notes (Signed)
   HPI: 43 year old male presents the office today for evaluation of right foot pain.  He still has throbbing when he is on his feet all day.  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  Patient also says that he has an appointment with pain management on 03/31/2018.  He presents for further treatment evaluation  Past Medical History:  Diagnosis Date  . Acute hepatitis C without mention of hepatic coma(070.51)    has been cleared   . Alcohol abuse, unspecified   . Anxiety and depression   . Anxiety states   . Essential hypertension, benign   . Graves disease    treated by Dr Loanne Drilling  . Panic disorder without agoraphobia   . Thyroid disease    Graves Disease  . Tobacco use disorder      Physical Exam: General: The patient is alert and oriented x3 in no acute distress.  Dermatology: Skin is warm, dry and supple bilateral lower extremities. Negative for open lesions or macerations.  Vascular: Palpable pedal pulses bilaterally. No edema or erythema noted. Capillary refill within normal limits.  Neurological: Epicritic and protective threshold grossly intact bilaterally.   Musculoskeletal Exam: Range of motion within normal limits to all pedal and ankle joints bilateral. Muscle strength 5/5 in all groups bilateral.  Significant amount of pain on palpation to the midfoot right foot.  Also pain on palpation along the metatarsals right foot.  Radiographic Exam:  Normal osseous mineralization. Joint spaces preserved. No fracture/dislocation/boney destruction.  Metatarsal fractures noted on prior exams do have routine healing noted.  Fractures appear completely healed.  Assessment: 1.  Capsulitis right foot 2.  Metatarsal fractures right foot- healed   Plan of Care:  1. Patient evaluated. X-Rays reviewed.  2.  Today we will refill the patient's pain medication one final time to hold him over until he sees pain management.  At this time I will  sign off for the patient regarding any pain medication use.  I do believe that the patient does have chronic pain to the foot exacerbated by the patient's nature of work.  He also has a history of trauma to the foot which does appear to be resolved based on radiographic exam. 3.  Return to clinic as needed      Edrick Kins, DPM [facility]  Dr. Edrick Kins, DPM    [address], [place] [address]                Office [phone]  Fax [phone]

## 2018-04-19 ENCOUNTER — Encounter: Payer: Self-pay | Admitting: Adult Health

## 2018-04-20 ENCOUNTER — Other Ambulatory Visit: Payer: Self-pay | Admitting: Sports Medicine

## 2018-04-20 ENCOUNTER — Other Ambulatory Visit (HOSPITAL_COMMUNITY): Payer: Self-pay | Admitting: Sports Medicine

## 2018-04-20 DIAGNOSIS — M545 Low back pain, unspecified: Secondary | ICD-10-CM

## 2018-04-20 MED FILL — LEVOTHYROXINE 150 MCG TAB: 150 | 90 days supply | Qty: 90 | Fill #3

## 2018-04-21 ENCOUNTER — Other Ambulatory Visit: Payer: Self-pay | Admitting: Adult Health

## 2018-04-21 DIAGNOSIS — F419 Anxiety disorder, unspecified: Secondary | ICD-10-CM

## 2018-04-21 DIAGNOSIS — I10 Essential (primary) hypertension: Secondary | ICD-10-CM

## 2018-04-21 NOTE — Telephone Encounter (Signed)
Sent to the pharmacy by e-scribe. 

## 2018-04-22 MED FILL — ATORVASTATIN CALCIUM 20 MG: 20 | 90 days supply | Qty: 90 | Fill #0 | Status: TO

## 2018-04-22 MED FILL — SERTRALINE HCL 100 MG TAB: 100 | 90 days supply | Qty: 90 | Fill #0 | Status: TO

## 2018-04-22 MED FILL — LISINOPRIL 40 MG TABLET: 40 | 90 days supply | Qty: 90 | Fill #0 | Status: TO

## 2018-04-24 ENCOUNTER — Ambulatory Visit (HOSPITAL_COMMUNITY)
Admission: RE | Admit: 2018-04-24 | Discharge: 2018-04-24 | Disposition: A | Discharge status: Home / Self Care | Payer: 59 | Source: Ambulatory Visit | Attending: Sports Medicine | Admitting: Sports Medicine

## 2018-04-24 DIAGNOSIS — M545 Low back pain, unspecified: Secondary | ICD-10-CM

## 2018-04-30 DIAGNOSIS — N5201 Erectile dysfunction due to arterial insufficiency: Secondary | ICD-10-CM | POA: Diagnosis not present

## 2018-04-30 DIAGNOSIS — E349 Endocrine disorder, unspecified: Secondary | ICD-10-CM | POA: Diagnosis not present

## 2018-04-30 MED FILL — SILDENAFIL CITRATE 20 MG TA: 20 | 18 days supply | Qty: 90 | Fill #0 | Status: TO

## 2018-05-04 DIAGNOSIS — M5136 Other intervertebral disc degeneration, lumbar region: Secondary | ICD-10-CM | POA: Diagnosis not present

## 2018-05-04 DIAGNOSIS — M545 Low back pain: Secondary | ICD-10-CM | POA: Diagnosis not present

## 2018-05-07 MED FILL — DEPO-TESTOSTERONE 200 MG/ML: 200 | 84 days supply | Qty: 6 | Fill #0

## 2018-05-26 ENCOUNTER — Ambulatory Visit: Payer: Self-pay | Admitting: Nurse Practitioner

## 2018-06-03 ENCOUNTER — Encounter: Payer: Self-pay | Admitting: Nurse Practitioner

## 2018-06-03 ENCOUNTER — Other Ambulatory Visit: Payer: Self-pay

## 2018-06-03 ENCOUNTER — Ambulatory Visit: Payer: 59 | Attending: Nurse Practitioner | Admitting: Nurse Practitioner

## 2018-06-03 VITALS — BP 160/96 | HR 71 | Temp 98.2°F | Resp 16 | Ht 66.0 in | Wt 201.0 lb

## 2018-06-03 DIAGNOSIS — Z79899 Other long term (current) drug therapy: Secondary | ICD-10-CM | POA: Insufficient documentation

## 2018-06-03 DIAGNOSIS — Z789 Other specified health status: Secondary | ICD-10-CM | POA: Diagnosis not present

## 2018-06-03 DIAGNOSIS — M545 Low back pain, unspecified: Secondary | ICD-10-CM | POA: Insufficient documentation

## 2018-06-03 DIAGNOSIS — G894 Chronic pain syndrome: Secondary | ICD-10-CM | POA: Insufficient documentation

## 2018-06-03 DIAGNOSIS — G8929 Other chronic pain: Secondary | ICD-10-CM | POA: Diagnosis not present

## 2018-06-03 DIAGNOSIS — Z79891 Long term (current) use of opiate analgesic: Secondary | ICD-10-CM | POA: Diagnosis not present

## 2018-06-03 DIAGNOSIS — M899 Disorder of bone, unspecified: Secondary | ICD-10-CM | POA: Insufficient documentation

## 2018-06-03 NOTE — Progress Notes (Signed)
Patient's Name: Chad Wilson  MRN: [id number]  Referring Provider: Pedro Earls, MD  DOB: [birth year]  PCP: Dorothyann Peng, NP  DOS: 06/03/2018  Note by: Dionisio David NP  Service setting: Ambulatory outpatient  Specialty: Interventional Pain Management  Location: [facility] (AMB) Pain Management Facility    Patient type: New Patient    Primary Reason(s) for Visit: Initial Patient Evaluation CC: Back Pain (lumbar bilateral )  HPI  Mr. Bilton is a 43 y.o. year old, male patient, who comes today for an initial evaluation. He has HEPATITIS C; Anxiety and depression; PANIC ATTACK; Alcohol abuse; TOBACCO ABUSE; HYPERTENSION, BENIGN ESSENTIAL; FATIGUE; JAUNDICE; Neck pain; Cervical radiculopathy at C6; Chronic midline low back pain with bilateral sciatica; Hypothyroidism; Status post lumbar spine surgery for decompression of spinal cord; S/P cervical spinal fusion; Degeneration of lumbar intervertebral disc; Lumbar pain; Chronic pain syndrome; Pharmacologic therapy; Long term current use of opiate analgesic; Disorder of skeletal system; Problems influencing health status; and Chronic bilateral low back pain without sciatica (Primary Area of Pain) (R = L) on their problem list.. His primarily concern today is the Back Pain (lumbar bilateral )  Pain Assessment: Location: Lower, Left, Right Back Radiating: denies  Onset: More than a month ago Duration: Chronic pain Quality: Discomfort, Constant, Aching, Burning, Tightness Severity: 8 /10 (subjective, self-reported pain score)  Note: Reported level is compatible with observation.                         When using our objective Pain Scale, levels between 6 and 10/10 are said to belong in an emergency room, as it progressively worsens from a 6/10, described as severely limiting, requiring emergency care not usually available at an outpatient pain management facility. At a 6/10 level, communication becomes difficult and requires great effort. Assistance to reach  the emergency department may be required. Facial flushing and profuse sweating along with potentially dangerous increases in heart rate and blood pressure will be evident. Effect on ADL: patient has difficulty upon rising in the morning.  Timing: Constant Modifying factors: hydrocodone apap 5 - 325 mg  BP: (!) 160/96  HR: 71  Onset and Duration: Gradual and Present longer than 3 months Cause of pain: Unknown Severity: Getting worse, NAS-11 at its worse: 10/10, NAS-11 at its best: 8/10, NAS-11 now: 8/10 and NAS-11 on the average: 8/10 Timing: Morning and During activity or exercise Aggravating Factors: Bending, Kneeling, Lifiting, Prolonged standing, Stooping  and Walking uphill Alleviating Factors: Sitting and Sleeping Associated Problems: Pain that wakes patient up Quality of Pain: Annoying, Burning, Constant, Dreadful, Exhausting, Pressure-like, Sickening, Throbbing and Toothache-like Previous Examinations or Tests: CT scan and MRI scan Previous Treatments: Narcotic medications, Steroid treatments by mouth and Trigger point injections  The patient comes into the clinics today for the first time for a chronic pain management evaluation.  According to the patient his primary area of pain is in his lower back.  He feels like the right is equal to the left.  He denies any precipitating factors.  He denies any leg pain or weakness.  He denies any previous surgery.  He has had injections in the past however did not feel that they were effective.  He has not done any recent physical therapy however he does work full-time.  Has had a recent MRI.  Today I took the time to provide the patient with information regarding this pain practice. The patient was informed that the practice is divided into  two sections: an interventional pain management section, as well as a completely separate and distinct medication management section. I explained that there are procedure days for interventional therapies, and  evaluation days for follow-ups and medication management. Because of the amount of documentation required during both, they are kept separated. This means that there is the possibility that he may be scheduled for a procedure on one day, and medication management the next. I have also informed him that because of staffing and facility limitations, this practice will no longer take patients for medication management only. To illustrate the reasons for this, I gave the patient the example of surgeons, and how inappropriate it would be to refer a patient to his/her care, just to write for the post-surgical antibiotics on a surgery done by a different surgeon.   Because interventional pain management is part of the board-certified specialty for the doctors, the patient was informed that joining this practice means that they are open to any and all interventional therapies. I made it clear that this does not mean that they will be forced to have any procedures done. What this means is that I believe interventional therapies to be essential part of the diagnosis and proper management of chronic pain conditions. Therefore, patients not interested in these interventional alternatives will be better served under the care of a different practitioner.  The patient was also made aware of my Comprehensive Pain Management Safety Guidelines where by joining this practice, they limit all of their nerve blocks and joint injections to those done by our practice, for as long as we are retained to manage their care. Historic Controlled Substance Pharmacotherapy Review  PMP and historical list of controlled substances: Hydrocodone/acetaminophen 5/325 mg, lorazepam 0.5 mg, Depakote testosterone 200 mg/mL, hydrocodone/acetaminophen 10/325 mg, tramadol 50 mg, alprazolam 0.25 mg, Highest opioid analgesic regimen found: Hydrocodone/acetaminophen 10/325 mg, 1 tablet every 4 hours (fill date 04/23/2017) hydrocodone 60 mg/day Most recent  opioid analgesic: Hydrocodone/acetaminophen 5/325 mg 1 tablet 4 times daily (fill date 05/15/2018) hydrocodone 20 mg/day Current opioid analgesics: None Highest recorded MME/day: 60 mg/day MME/day:0 mg/day Medications: The patient did not bring the medication(s) to the appointment, as requested in our "New Patient Package" Pharmacodynamics: Desired effects: Analgesia: The patient reports >50% benefit. Reported improvement in function: The patient reports medication allows him to accomplish basic ADLs. Clinically meaningful improvement in function (CMIF): Sustained CMIF goals met Perceived effectiveness: Described as relatively effective, allowing for increase in activities of daily living (ADL) Undesirable effects: Side-effects or Adverse reactions: None reported Historical Monitoring: The patient  reports no history of drug use. List of all UDS Test(s): Lab Results  Component Value Date   COCAINSCRNUR NONE DETECTED 09/29/2009   COCAINSCRNUR POSITIVE (A) 09/08/2007   THCU NONE DETECTED 09/29/2009   THCU NONE DETECTED 09/08/2007   ETH <5 07/09/2016   ETH  09/29/2009    <5        LOWEST DETECTABLE LIMIT FOR SERUM ALCOHOL IS 5 mg/dL FOR MEDICAL PURPOSES ONLY   ETH (H) 09/08/2007    156        LOWEST DETECTABLE LIMIT FOR SERUM ALCOHOL IS 11 mg/dL FOR MEDICAL PURPOSES ONLY   List of all Serum Drug Screening Test(s):  No results found for: AMPHSCRSER, BARBSCRSER, BENZOSCRSER, COCAINSCRSER, PCPSCRSER, PCPQUANT, THCSCRSER, CANNABQUANT, OPIATESCRSER, OXYSCRSER, PROPOXSCRSER Historical Background Evaluation: [place] PDMP: Six (6) year initial data search conducted.             [place] Department of public safety, offender search: Editor, commissioning  Information) Non-contributory Risk Assessment Profile: Aberrant behavior: None observed or detected today Risk factors for fatal opioid overdose: age 2-17 years old, Benzodiazepine use, history of alcoholism, history of substance use disorder, liver disease and  male gender Fatal overdose hazard ratio (HR): Calculation deferred Non-fatal overdose hazard ratio (HR): Calculation deferred Risk of opioid abuse or dependence: 0.7-3.0% with doses ? 36 MME/day and 6.1-26% with doses ? 120 MME/day. Substance use disorder (SUD) risk level: Pending results of Medical Psychology Evaluation for SUD Opioid risk tool (ORT) (Total Score): 4  ORT Scoring interpretation table:  Score <3 = Low Risk for SUD  Score between 4-7 = Moderate Risk for SUD  Score >8 = High Risk for Opioid Abuse   PHQ-2 Depression Scale:  Total score:    PHQ-2 Scoring interpretation table: (Score and probability of major depressive disorder)  Score 0 = No depression  Score 1 = 15.4% Probability  Score 2 = 21.1% Probability  Score 3 = 38.4% Probability  Score 4 = 45.5% Probability  Score 5 = 56.4% Probability  Score 6 = 78.6% Probability   PHQ-9 Depression Scale:  Total score:    PHQ-9 Scoring interpretation table:  Score 0-4 = No depression  Score 5-9 = Mild depression  Score 10-14 = Moderate depression  Score 15-19 = Moderately severe depression  Score 20-27 = Severe depression (2.4 times higher risk of SUD and 2.89 times higher risk of overuse)   Pharmacologic Plan: Pending ordered tests and/or consults  Meds  The patient has a current medication list which includes the following prescription(s): atorvastatin, citalopram, depo-testosterone, hydrocodone-acetaminophen, levothyroxine, lisinopril, lorazepam, sertraline, sildenafil, and omeprazole.  Current Outpatient Medications on File Prior to Visit  Medication Sig  . atorvastatin (LIPITOR) 20 MG tablet TAKE 1 TABLET (20 MG TOTAL) BY MOUTH DAILY.  . citalopram (CELEXA) 40 MG tablet Take 40 mg by mouth daily.  Marland Kitchen DEPO-TESTOSTERONE 200 MG/ML injection Take 200 mg by mouth every 14 (fourteen) days.  Marland Kitchen HYDROcodone-acetaminophen (NORCO) 10-325 MG tablet Take 1 tablet by mouth every 6 (six) hours.  Marland Kitchen levothyroxine (SYNTHROID,  LEVOTHROID) 150 MCG tablet Take 1 tablet (150 mcg total) by mouth daily.  Marland Kitchen lisinopril (PRINIVIL,ZESTRIL) 40 MG tablet TAKE 1 TABLET BY MOUTH ONCE DAILY  . LORazepam (ATIVAN) 0.5 MG tablet Take 1 tablet (0.5 mg total) by mouth every 8 (eight) hours as needed for anxiety.  . sertraline (ZOLOFT) 100 MG tablet TAKE 1 TABLET (100 MG TOTAL) BY MOUTH DAILY.  . sildenafil (VIAGRA) 100 MG tablet Take 1 tablet (100 mg total) by mouth daily as needed for erectile dysfunction.  . [DISCONTINUED] omeprazole (PRILOSEC) 20 MG capsule Take 1 capsule (20 mg total) by mouth daily.   No current facility-administered medications on file prior to visit.    Imaging Review  Cervical Imaging: Cervical MR wo contrast:  Results for orders placed during the hospital encounter of 02/02/17  MR Cervical Spine w/o contrast   Narrative CLINICAL DATA:  Fall 3 weeks ago. Neck pain and stiffness. Bilateral arm weakness. In  EXAM: MRI CERVICAL SPINE WITHOUT CONTRAST  TECHNIQUE: Multiplanar, multisequence MR imaging of the cervical spine was performed. No intravenous contrast was administered.  COMPARISON:  Cervical spine radiographs 12/31/2016  FINDINGS: Alignment: Grade 1 degenerative retrolisthesis CT C6-7 measures 2 mm. AP alignment is otherwise anatomic. There straightening of the normal cervical lordosis.  Vertebrae: Marrow signal and vertebral body heights are normal.  Cord: T2 hyperintensity is evident in the right lateral aspect of cord at the  C6-7 level extending 8 mm cephalo caudad. Normal signal is present through the remainder of the cervical spinal cord.  Posterior Fossa, vertebral arteries, paraspinal tissues: The craniocervical junction is normal. The visualized intracranial contents are normal. Flow is present in the vertebral arteries bilaterally.  Disc levels:  C2-3:  Negative.  C3-4: Bilateral uncovertebral spurring and mild foraminal stenosis is present. The central canal is  patent.  C4-5: A mild broad-based disc osteophyte complex is present. There is partial effacement of ventral CSF. Facet hypertrophy is worse on the left. Moderate left and mild right foraminal narrowing is present.  C5-6: A large soft disc right paramedian disc extrusion contacts and distorts the spinal cord. The canal is narrowed to 5.5 mm on the right. Moderate left and mild right foraminal narrowing is secondary to a uncovertebral spurring.  C6-7: A broad-based rightward disc osteophyte complex is present. There is a significant soft disc component. This results in severe central canal narrowing. The canal is narrowed to 6.5 mm. Abnormal T2 signal is present on the right. Severe foraminal stenosis is present bilaterally, right greater than left.  C7-T1: Asymmetric moderate right-sided facet hypertrophy is present without significant stenosis.  IMPRESSION: 1. Prominent right paramedian soft disc protrusion contacts and distorts the central and right paramedian cord at the C5-6 level with severe central canal stenosis. 2. Moderate left and mild right foraminal stenosis at C5-6 secondary to uncovertebral spurring. 3. Broad-based rightward disc osteophyte complex with significant soft disc component results in severe right central canal stenosis and abnormal cord signal, likely edema. 4. Severe foraminal stenosis at C6-7 is worse on the right. 5. Mild central canal narrowing at C4-5 with moderate left and mild right foraminal stenosis. 6. Mild foraminal narrowing bilaterally at C3-4. 7. Asymmetric right-sided facet hypertrophy at C7-T1 without significant stenosis.   Electronically Signed   By: San Morelle M.D.   On: 02/02/2017 13:46   Cervical DG 2-3 views:  Results for orders placed during the hospital encounter of 05/21/17  DG Cervical Spine 2-3 Views   Narrative CLINICAL DATA:  C5-7 ACDF  EXAM: DG C-ARM 61-120 MIN; CERVICAL SPINE - 2-3 VIEW  COMPARISON:   Cervical spine MRI 02/02/2017  FINDINGS: Three images from intraoperative fluoroscopy obtained during C5-C7 ACDF. 18 seconds of fluoroscopy time was reported.  IMPRESSION: Intraoperative fluoroscopy.   Electronically Signed   By: Ulyses Jarred M.D.   On: 05/21/2017 15:25    Results for orders placed during the hospital encounter of 09/06/11  DG Cervical Spine Complete   Narrative *RADIOLOGY REPORT*  Clinical Data: Right-sided neck pain radiating to the right hand  CERVICAL SPINE - COMPLETE 4+ VIEW  Comparison: None  Findings: The cervical vertebrae are straightened in alignment. There is degenerative disc disease at C6-7 with loss of disc space and spurring.  There is very minimal retrolisthesis of C6 on C7 by approximately 2 mm.  This slight retrolisthesis most likely is due to degenerative change. There is mild foraminal narrowing bilaterally at the  C6-7 level.  The odontoid process is intact. The lung apices are clear.  IMPRESSION:  1.  Straightened alignment with degenerative disc disease at C6-7 with some foraminal narrowing. 2.  2 mm retrolisthesis of C6 on C7 by 2 mm most likely degenerative.  Original Report Authenticated By: Joretta Bachelor, M.D.  Lumbosacral Imaging: Lumbar MR wo contrast:  Results for orders placed during the hospital encounter of 04/24/18  MR LUMBAR SPINE WO CONTRAST   Narrative CLINICAL DATA:  43 year old  male with 2 months of low back pain. No known injury.  EXAM: MRI LUMBAR SPINE WITHOUT CONTRAST  TECHNIQUE: Multiplanar, multisequence MR imaging of the lumbar spine was performed. No intravenous contrast was administered.  COMPARISON:  Lumbar MRI 02/02/2017.  Lumbar radiographs 12/31/2016.  FINDINGS: Segmentation: Normal on the comparison radiographs which is the same numbering system used on the 2018 MRI.  Alignment: Stable lumbar lordosis. Subtle retrolisthesis of L5 on S1.  Vertebrae: Subtle degenerative endplate marrow  edema anteriorly at L3-L4, associated with chronic degenerative endplate spurring. Normal background bone marrow signal. No other marrow edema or acute osseous abnormality. Intact visible sacrum and SI joints.  Conus medullaris and cauda equina: Conus extends to the L1 level. No lower spinal cord or conus signal abnormality.  Paraspinal and other soft tissues: Negative.  Disc levels:  T11-T12: Mild disc desiccation.  T12-L1:  Negative.  L1-L2:  Negative.  L2-L3:  Minimal disc bulging and facet hypertrophy.  No stenosis.  L3-L4: Mild disc desiccation and circumferential disc bulge. Mild facet hypertrophy with trace degenerative facet joint fluid. No spinal or lateral recess stenosis. Mild bilateral L3 foraminal stenosis is stable.  L4-L5: Mild far lateral disc bulging and endplate spurring. Mild facet hypertrophy. Trace facet joint fluid and occasional posterior small synovial cysts. No spinal or lateral recess stenosis. Mild bilateral L4 foraminal stenosis is stable.  L5-S1:  Borderline to mild facet hypertrophy.  No stenosis.  IMPRESSION: Stable MRI appearance of the lumbar spine since 2018. Mild degenerative disc bulging. Multilevel mild facet hypertrophy.  No spinal or lateral recess stenosis. Mild bilateral L3 and L4 foraminal stenosis.   Electronically Signed   By: Genevie Ann M.D.   On: 04/24/2018 20:30    Note: Available results from prior imaging studies were reviewed.        ROS  Cardiovascular History: High blood pressure Pulmonary or Respiratory History: Smoking Neurological History: No reported neurological signs or symptoms such as seizures, abnormal skin sensations, urinary and/or fecal incontinence, being born with an abnormal open spine and/or a tethered spinal cord Review of Past Neurological Studies: No results found for this or any previous visit. Psychological-Psychiatric History: Anxiousness and Depressed Gastrointestinal History: No reported  gastrointestinal signs or symptoms such as vomiting or evacuating blood, reflux, heartburn, alternating episodes of diarrhea and constipation, inflamed or scarred liver, or pancreas or irrregular and/or infrequent bowel movements Genitourinary History: No reported renal or genitourinary signs or symptoms such as difficulty voiding or producing urine, peeing blood, non-functioning kidney, kidney stones, difficulty emptying the bladder, difficulty controlling the flow of urine, or chronic kidney disease Hematological History: No reported hematological signs or symptoms such as prolonged bleeding, low or poor functioning platelets, bruising or bleeding easily, hereditary bleeding problems, low energy levels due to low hemoglobin or being anemic Endocrine History: No reported endocrine signs or symptoms such as high or low blood sugar, rapid heart rate due to high thyroid levels, obesity or weight gain due to slow thyroid or thyroid disease Rheumatologic History: Rheumatoid arthritis Musculoskeletal History: Negative for myasthenia gravis, muscular dystrophy, multiple sclerosis or malignant hyperthermia Work History: Working full time  Allergies  Mr. Terrio is allergic to chantix [varenicline].  Laboratory Chemistry  Inflammation Markers No results found for: CRP, ESRSEDRATE (CRP: Acute Phase) (ESR: Chronic Phase) Renal Function Markers Lab Results  Component Value Date   BUN 13 02/17/2018   CREATININE 1.13 02/17/2018   GFRAA >60 05/07/2017   GFRNONAA >60 05/07/2017   Hepatic Function Markers Lab Results  Component Value Date   AST 16 10/17/2017   ALT 25 10/17/2017   ALBUMIN 4.7 10/17/2017   ALKPHOS 89 10/17/2017   HCVAB (A) 09/29/2009    Reactive (NOTE) Result repeated and verified.                                                                       This test is for screening purposes only.  Reactive results should be confirmed by an alternative method.  Suggest HCV Qualitative, PCR,  test code  83130.  Specimens will be stable for reflex testing up to 3 days after collection.   Electrolytes Lab Results  Component Value Date   NA 136 02/17/2018   K 4.2 02/17/2018   CL 101 02/17/2018   CALCIUM 9.4 02/17/2018   Neuropathy Markers Lab Results  Component Value Date   VITAMINB12 147 (L) 02/17/2018   Bone Pathology Markers Lab Results  Component Value Date   ALKPHOS 89 10/17/2017   VD25OH 21.01 (L) 02/17/2018   CALCIUM 9.4 02/17/2018   TESTOFREE See below 02/17/2018   TESTOSTERONE 147 (L) 02/17/2018   Coagulation Parameters Lab Results  Component Value Date   INR 0.99 05/07/2017   LABPROT 13.0 05/07/2017   APTT 28 02/27/2010   PLT 223.0 02/17/2018   Cardiovascular Markers Lab Results  Component Value Date   HGB 15.3 02/17/2018   HCT 43.1 02/17/2018   Note: Lab results reviewed.  PFSH  Drug: Mr. Ingle  reports no history of drug use. Alcohol:  reports previous alcohol use. Tobacco:  reports that he has been smoking. He has a 30.00 pack-year smoking history. His smokeless tobacco use includes chew. Medical:  has a past medical history of Acute hepatitis C without mention of hepatic coma(070.51), Alcohol abuse, unspecified, Anxiety and depression, Anxiety states, Essential hypertension, benign, Graves disease, Panic disorder without agoraphobia, Thyroid disease, and Tobacco use disorder. Family: family history includes Anxiety disorder in his father and sister; Heart disease in his father; Hypertension in his father; Hyperthyroidism in his sister; Thyroid disease in his mother.  Past Surgical History:  Procedure Laterality Date  . ANTERIOR CERVICAL DECOMP/DISCECTOMY FUSION N/A 05/21/2017   Procedure: Anterior Cervical Decompression Fusion Cervical five-six, Cervical six-seven;  Surgeon: Eustace Moore, MD;  Location: [facility];  Service: Neurosurgery;  Laterality: N/A;  . MULTIPLE TOOTH EXTRACTIONS  15 yrs. ago   Active Ambulatory Problems    Diagnosis Date  Noted  . HEPATITIS C 11/21/2009  . Anxiety and depression 11/21/2009  . PANIC ATTACK 11/21/2009  . Alcohol abuse 11/21/2009  . TOBACCO ABUSE 11/21/2009  . HYPERTENSION, BENIGN ESSENTIAL 11/21/2009  . FATIGUE 11/21/2009  . JAUNDICE 05/21/2010  . Neck pain 09/06/2011  . Cervical radiculopathy at C6 09/06/2011  . Chronic midline low back pain with bilateral sciatica 12/31/2016  . Hypothyroidism 04/14/2017  . 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 S/P cervical spinal fusion 05/21/2017  . Degeneration of lumbar intervertebral disc 04/06/2018  . Lumbar pain 04/06/2018  . Chronic pain syndrome 06/03/2018  . Pharmacologic therapy 06/03/2018  . Long term current use of opiate analgesic 06/03/2018  . Disorder of skeletal system 06/03/2018  . Problems influencing health status 06/03/2018  . Chronic bilateral low  back pain without sciatica (Primary Area of Pain) (R = L) 06/03/2018   Resolved Ambulatory Problems    Diagnosis Date Noted  . No Resolved Ambulatory Problems   Past Medical History:  Diagnosis Date  . Alcohol abuse, unspecified   . Anxiety states   . Graves disease   . Thyroid disease    Constitutional Exam  General appearance: Well nourished, well developed, and well hydrated. In no apparent acute distress Vitals:   06/03/18 1320  BP: (!) 160/96  Pulse: 71  Resp: 16  Temp: 98.2 F (36.8 C)  TempSrc: Oral  SpO2: 98%  Weight: 201 lb (91.2 kg)  Height: 5' 6"  (1.676 m)   BMI Assessment: Estimated body mass index is 32.44 kg/m as calculated from the following:   Height as of this encounter: 5' 6"  (1.676 m).   Weight as of this encounter: 201 lb (91.2 kg).  BMI interpretation table: BMI level Category Range association with higher incidence of chronic pain  <18 kg/m2 Underweight   18.5-24.9 kg/m2 Ideal body weight   25-29.9 kg/m2 Overweight Increased incidence by 20%  30-34.9 kg/m2 Obese (Class I) Increased incidence by 68%   35-39.9 kg/m2 Severe obesity (Class II) Increased incidence by 136%  >40 kg/m2 Extreme obesity (Class III) Increased incidence by 254%   BMI Readings from Last 4 Encounters:  06/03/18 32.44 kg/m  04/07/18 30.58 kg/m  02/17/18 29.65 kg/m  12/24/17 29.56 kg/m   Wt Readings from Last 4 Encounters:  06/03/18 201 lb (91.2 kg)  04/07/18 201 lb 1.6 oz (91.2 kg)  02/17/18 195 lb (88.5 kg)  12/24/17 194 lb 6.4 oz (88.2 kg)  Psych/Mental status: Alert, oriented x 3 (person, place, & time)       Eyes: PERLA Respiratory: No evidence of acute respiratory distress  Cervical Spine Exam  Inspection: No masses, redness, or swelling Alignment: Symmetrical Functional ROM: Unrestricted ROM      Stability: No instability detected Muscle strength & Tone: Functionally intact Sensory: Unimpaired Palpation: No palpable anomalies              Upper Extremity (UE) Exam    Side: Right upper extremity  Side: Left upper extremity  Inspection: No masses, redness, swelling, or asymmetry. No contractures  Inspection: No masses, redness, swelling, or asymmetry. No contractures  Functional ROM: Unrestricted ROM          Functional ROM: Unrestricted ROM          Muscle strength & Tone: Functionally intact  Muscle strength & Tone: Functionally intact  Sensory: Unimpaired  Sensory: Unimpaired  Palpation: No palpable anomalies              Palpation: No palpable anomalies              Specialized Test(s): Deferred         Specialized Test(s): Deferred          Thoracic Spine Exam  Inspection: No masses, redness, or swelling Alignment: Symmetrical Functional ROM: Unrestricted ROM Stability: No instability detected Sensory: Unimpaired Muscle strength & Tone: No palpable anomalies  Lumbar Spine Exam  Inspection: No masses, redness, or swelling Alignment: Symmetrical Functional ROM: Unrestricted ROM      Stability: No instability detected Muscle strength & Tone: Functionally intact Sensory:  Unimpaired Palpation: No palpable anomalies       Provocative Tests: Lumbar Hyperextension and rotation test: evaluation deferred today       Patrick's Maneuver: evaluation deferred today  Gait & Posture Assessment  Ambulation: Unassisted Gait: Relatively normal for age and body habitus Posture: WNL   Lower Extremity Exam    Side: Right lower extremity  Side: Left lower extremity  Inspection: No masses, redness, swelling, or asymmetry. No contractures  Inspection: No masses, redness, swelling, or asymmetry. No contractures  Functional ROM: Unrestricted ROM          Functional ROM: Unrestricted ROM          Muscle strength & Tone: Functionally intact  Muscle strength & Tone: Functionally intact  Sensory: Unimpaired  Sensory: Unimpaired  Palpation: No palpable anomalies  Palpation: No palpable anomalies   Assessment  Primary Diagnosis & Pertinent Problem List: The primary encounter diagnosis was Chronic bilateral low back pain without sciatica (Primary Area of Pain) (R = L). Diagnoses of Chronic pain syndrome, Pharmacologic therapy, Long term current use of opiate analgesic, Disorder of skeletal system, and Problems influencing health status were also pertinent to this visit.  Visit Diagnosis: 1. Chronic bilateral low back pain without sciatica (Primary Area of Pain) (R = L)   2. Chronic pain syndrome   3. Pharmacologic therapy   4. Long term current use of opiate analgesic   5. Disorder of skeletal system   6. Problems influencing health status    Plan of Care  Initial treatment plan:  Please be advised that as per protocol, today's visit has been an evaluation only. We have not taken over the patient's controlled substance management.  Problem-specific plan: No problem-specific Assessment & Plan notes found for this encounter.  Ordered Lab-work, Procedure(s), Referral(s), & Consult(s): Orders Placed This Encounter  Procedures  . Compliance Drug Analysis, Ur   . Comp. Metabolic Panel (12)  . Magnesium  . Sedimentation rate  . C-reactive protein   Pharmacotherapy: Medications ordered:  No orders of the defined types were placed in this encounter.  Medications administered during this visit: Chad E. Muntean had no medications administered during this visit.   Pharmacotherapy under consideration:  Opioid Analgesics: The patient was informed that there is no guarantee that he would be a candidate for opioid analgesics. The decision will be made following CDC guidelines. This decision will be based on the results of diagnostic studies, as well as Mr. Winkleman risk profile.  Membrane stabilizer: To be determined at a later time Muscle relaxant: To be determined at a later time NSAID: To be determined at a later time Other analgesic(s): To be determined at a later time   Interventional therapies under consideration: Mr. Colaizzi was informed that there is no guarantee that he would be a candidate for interventional therapies. The decision will be based on the results of diagnostic studies, as well as Mr. Koopmann risk profile.  Possible procedure(s): Diagnostic bilateral lumbar facet nerve block Possible bilateral lumbar facet radiofrequency ablation   Provider-requested follow-up: Return for 2nd Visit (Post-test), medical record release.  Future Appointments  Date Time Provider [place]  06/29/2018  7:45 AM Milinda Pointer, MD [facility] None    Primary Care Physician: Dorothyann Peng, NP Location: [facility] Outpatient Pain Management Facility Note by:  Date: 06/03/2018; Time: 3:32 PM  Pain Score Disclaimer: We use the NRS-11 scale. This is a self-reported, subjective measurement of pain severity with only modest accuracy. It is used primarily to identify changes within a particular patient. It must be understood that outpatient pain scales are significantly less accurate that those used for research, where they can be applied under ideal  controlled circumstances with minimal  exposure to variables. In reality, the score is likely to be a combination of pain intensity and pain affect, where pain affect describes the degree of emotional arousal or changes in action readiness caused by the sensory experience of pain. Factors such as social and work situation, setting, emotional state, anxiety levels, expectation, and prior pain experience may influence pain perception and show large inter-individual differences that may also be affected by time variables.  Patient instructions provided during this appointment: Patient Instructions   ____________________________________________________________________________________________  Appointment Policy Summary  It is our goal and responsibility to provide the medical community with assistance in the evaluation and management of patients with chronic pain. Unfortunately our resources are limited. Because we do not have an unlimited amount of time, or available appointments, we are required to closely monitor and manage their use. The following rules exist to maximize their use:  Patient's responsibilities: 1. Punctuality:  At what time should I arrive? You should be physically present in our office 30 minutes before your scheduled appointment. Your scheduled appointment is with your assigned healthcare provider. However, it takes 5-10 minutes to be "checked-in", and another 15 minutes for the nurses to do the admission. If you arrive to our office at the time you were given for your appointment, you will end up being at least 20-25 minutes late to your appointment with the provider. 2. Tardiness:  What happens if I arrive only a few minutes after my scheduled appointment time? You will need to reschedule your appointment. The cutoff is your appointment time. This is why it is so important that you arrive at least 30 minutes before that appointment. If you have an appointment scheduled for 10:00 AM  and you arrive at 10:01, you will be required to reschedule your appointment.  3. Plan ahead:  Always assume that you will encounter traffic on your way in. Plan for it. If you are dependent on a driver, make sure they understand these rules and the need to arrive early. 4. Other appointments and responsibilities:  Avoid scheduling any other appointments before or after your pain clinic appointments.  5. Be prepared:  Write down everything that you need to discuss with your healthcare provider and give this information to the admitting nurse. Write down the medications that you will need refilled. Bring your pills and bottles (even the empty ones), to all of your appointments, except for those where a procedure is scheduled. 6. No children or pets:  Find someone to take care of them. It is not appropriate to bring them in. 7. Scheduling changes:  We request "advanced notification" of any changes or cancellations. 8. Advanced notification:  Defined as a time period of more than 24 hours prior to the originally scheduled appointment. This allows for the appointment to be offered to other patients. 9. Rescheduling:  When a visit is rescheduled, it will require the cancellation of the original appointment. For this reason they both fall within the category of "Cancellations".  10. Cancellations:  They require advanced notification. Any cancellation less than 24 hours before the  appointment will be recorded as a "No Show". 11. No Show:  Defined as an unkept appointment where the patient failed to notify or declare to the practice their intention or inability to keep the appointment.  Corrective process for repeat offenders:  1. Tardiness: Three (3) episodes of rescheduling due to late arrivals will be recorded as one (1) "No Show". 2. Cancellation or reschedule: Three (3) cancellations or rescheduling will be  recorded as one (1) "No Show". 3. "No Shows": Three (3) "No Shows" within a 12 month  period will result in discharge from the practice. ____________________________________________________________________________________________   ______________________________________________________________________________________________  Specialty Pain Scale  Introduction:  There are significant differences in how pain is reported. The word pain usually refers to physical pain, but it is also a common synonym of suffering. The medical community uses a scale from 0 (zero) to 10 (ten) to report pain level. Zero (0) is described as "no pain", while ten (10) is described as "the worse pain you can imagine". The problem with this scale is that physical pain is reported along with suffering. Suffering refers to mental pain, or more often yet it refers to any unpleasant feeling, emotion or aversion associated with the perception of harm or threat of harm. It is the psychological component of pain.  Pain Specialists prefer to separate the two components. The pain scale used by this practice is the Verbal Numerical Rating Scale (VNRS-11). This scale is for the physical pain only. DO NOT INCLUDE how your pain psychologically affects you. This scale is for adults 22 years of age and older. It has 11 (eleven) levels. The 1st level is 0/10. This means: "right now, I have no pain". In the context of pain management, it also means: "right now, my physical pain is under control with the current therapy".  General Information:  The scale should reflect your current level of pain. Unless you are specifically asked for the level of your worst pain, or your average pain. If you are asked for one of these two, then it should be understood that it is over the past 24 hours.  Levels 1 (one) through 5 (five) are described below, and can be treated as an outpatient. Ambulatory pain management facilities such as ours are more than adequate to treat these levels. Levels 6 (six) through 10 (ten) are also described below,  however, these must be treated as a hospitalized patient. While levels 6 (six) and 7 (seven) may be evaluated at an urgent care facility, levels 8 (eight) through 10 (ten) constitute medical emergencies and as such, they belong in a hospital's emergency department. When having these levels (as described below), do not come to our office. Our facility is not equipped to manage these levels. Go directly to an urgent care facility or an emergency department to be evaluated.  Definitions:  Activities of Daily Living (ADL): Activities of daily living (ADL or ADLs) is a term used in healthcare to refer to people's daily self-care activities. Health professionals often use a person's ability or inability to perform ADLs as a measurement of their functional status, particularly in regard to people post injury, with disabilities and the elderly. There are two ADL levels: Basic and Instrumental. Basic Activities of Daily Living (BADL  or BADLs) consist of self-care tasks that include: Bathing and showering; personal hygiene and grooming (including brushing/combing/styling hair); dressing; Toilet hygiene (getting to the toilet, cleaning oneself, and getting back up); eating and self-feeding (not including cooking or chewing and swallowing); functional mobility, often referred to as "transferring", as measured by the ability to walk, get in and out of bed, and get into and out of a chair; the broader definition (moving from one place to another while performing activities) is useful for people with different physical abilities who are still able to get around independently. Basic ADLs include the things many people do when they get up in the morning and get ready to  go out of the house: get out of bed, go to the toilet, bathe, dress, groom, and eat. On the average, loss of function typically follows a particular order. Hygiene is the first to go, followed by loss of toilet use and locomotion. The last to go is the ability  to eat. When there is only one remaining area in which the person is independent, there is a 62.9% chance that it is eating and only a 3.5% chance that it is hygiene. Instrumental Activities of Daily Living (IADL or IADLs) are not necessary for fundamental functioning, but they let an individual live independently in a community. IADL consist of tasks that include: cleaning and maintaining the house; home establishment and maintenance; care of others (including selecting and supervising caregivers); care of pets; child rearing; managing money; managing financials (investments, etc.); meal preparation and cleanup; shopping for groceries and necessities; moving within the community; safety procedures and emergency responses; health management and maintenance (taking prescribed medications); and using the telephone or other form of communication.  Instructions:  Most patients tend to report their pain as a combination of two factors, their physical pain and their psychosocial pain. This last one is also known as "suffering" and it is reflection of how physical pain affects you socially and psychologically. From now on, report them separately.  From this point on, when asked to report your pain level, report only your physical pain. Use the following table for reference.  Pain Clinic Pain Levels (0-5/10)  Pain Level Score  Description  No Pain 0   Mild pain 1 Nagging, annoying, but does not interfere with basic activities of daily living (ADL). Patients are able to eat, bathe, get dressed, toileting (being able to get on and off the toilet and perform personal hygiene functions), transfer (move in and out of bed or a chair without assistance), and maintain continence (able to control bladder and bowel functions). Blood pressure and heart rate are unaffected. A normal heart rate for a healthy adult ranges from 60 to 100 bpm (beats per minute).   Mild to moderate pain 2 Noticeable and distracting. Impossible  to hide from other people. More frequent flare-ups. Still possible to adapt and function close to normal. It can be very annoying and may have occasional stronger flare-ups. With discipline, patients may get used to it and adapt.   Moderate pain 3 Interferes significantly with activities of daily living (ADL). It becomes difficult to feed, bathe, get dressed, get on and off the toilet or to perform personal hygiene functions. Difficult to get in and out of bed or a chair without assistance. Very distracting. With effort, it can be ignored when deeply involved in activities.   Moderately severe pain 4 Impossible to ignore for more than a few minutes. With effort, patients may still be able to manage work or participate in some social activities. Very difficult to concentrate. Signs of autonomic nervous system discharge are evident: dilated pupils (mydriasis); mild sweating (diaphoresis); sleep interference. Heart rate becomes elevated (>115 bpm). Diastolic blood pressure (lower number) rises above 100 mmHg. Patients find relief in laying down and not moving.   Severe pain 5 Intense and extremely unpleasant. Associated with frowning face and frequent crying. Pain overwhelms the senses.  Ability to do any activity or maintain social relationships becomes significantly limited. Conversation becomes difficult. Pacing back and forth is common, as getting into a comfortable position is nearly impossible. Pain wakes you up from deep sleep. Physical signs will be obvious: pupillary  dilation; increased sweating; goosebumps; brisk reflexes; cold, clammy hands and feet; nausea, vomiting or dry heaves; loss of appetite; significant sleep disturbance with inability to fall asleep or to remain asleep. When persistent, significant weight loss is observed due to the complete loss of appetite and sleep deprivation.  Blood pressure and heart rate becomes significantly elevated. Caution: If elevated blood pressure triggers a  pounding headache associated with blurred vision, then the patient should immediately seek attention at an urgent or emergency care unit, as these may be signs of an impending stroke.    Emergency Department Pain Levels (6-10/10)  Emergency Room Pain 6 Severely limiting. Requires emergency care and should not be seen or managed at an outpatient pain management facility. Communication becomes difficult and requires great effort. Assistance to reach the emergency department may be required. Facial flushing and profuse sweating along with potentially dangerous increases in heart rate and blood pressure will be evident.   Distressing pain 7 Self-care is very difficult. Assistance is required to transport, or use restroom. Assistance to reach the emergency department will be required. Tasks requiring coordination, such as bathing and getting dressed become very difficult.   Disabling pain 8 Self-care is no longer possible. At this level, pain is disabling. The individual is unable to do even the most "basic" activities such as walking, eating, bathing, dressing, transferring to a bed, or toileting. Fine motor skills are lost. It is difficult to think clearly.   Incapacitating pain 9 Pain becomes incapacitating. Thought processing is no longer possible. Difficult to remember your own name. Control of movement and coordination are lost.   The worst pain imaginable 10 At this level, most patients pass out from pain. When this level is reached, collapse of the autonomic nervous system occurs, leading to a sudden drop in blood pressure and heart rate. This in turn results in a temporary and dramatic drop in blood flow to the brain, leading to a loss of consciousness. Fainting is one of the body's self defense mechanisms. Passing out puts the brain in a calmed state and causes it to shut down for a while, in order to begin the healing process.    Summary: 1.   Refer to this scale when providing [place] with your  pain level. 2.   Be accurate and careful when reporting your pain level. This will help with your care. 3.   Over-reporting your pain level will lead to loss of credibility. 4.   Even a level of 1/10 means that there is pain and will be treated at our facility. 5.   High, inaccurate reporting will be documented as "Symptom Exaggeration", leading to loss of credibility and suspicions of possible secondary gains such as obtaining more narcotics, or wanting to appear disabled, for fraudulent reasons. 6.   Only pain levels of 5 or below will be seen at our facility. 7.   Pain levels of 6 and above will be sent to the Emergency Department and the appointment cancelled.  ______________________________________________________________________________________________

## 2018-06-03 NOTE — Progress Notes (Signed)
Safety precautions to be maintained throughout the outpatient stay will include: orient to surroundings, keep bed in low position, maintain call bell within reach at all times, provide assistance with transfer out of bed and ambulation.  

## 2018-06-03 NOTE — Patient Instructions (Signed)
____________________________________________________________________________________________  Appointment Policy Summary  It is our goal and responsibility to provide the medical community with assistance in the evaluation and management of patients with chronic pain. Unfortunately our resources are limited. Because we do not have an unlimited amount of time, or available appointments, we are required to closely monitor and manage their use. The following rules exist to maximize their use:  Patient's responsibilities: 1. Punctuality:  At what time should I arrive? You should be physically present in our office 30 minutes before your scheduled appointment. Your scheduled appointment is with your assigned healthcare provider. However, it takes 5-10 minutes to be "checked-in", and another 15 minutes for the nurses to do the admission. If you arrive to our office at the time you were given for your appointment, you will end up being at least 20-25 minutes late to your appointment with the provider. 2. Tardiness:  What happens if I arrive only a few minutes after my scheduled appointment time? You will need to reschedule your appointment. The cutoff is your appointment time. This is why it is so important that you arrive at least 30 minutes before that appointment. If you have an appointment scheduled for 10:00 AM and you arrive at 10:01, you will be required to reschedule your appointment.  3. Plan ahead:  Always assume that you will encounter traffic on your way in. Plan for it. If you are dependent on a driver, make sure they understand these rules and the need to arrive early. 4. Other appointments and responsibilities:  Avoid scheduling any other appointments before or after your pain clinic appointments.  5. Be prepared:  Write down everything that you need to discuss with your healthcare provider and give this information to the admitting nurse. Write down the medications that you will need  refilled. Bring your pills and bottles (even the empty ones), to all of your appointments, except for those where a procedure is scheduled. 6. No children or pets:  Find someone to take care of them. It is not appropriate to bring them in. 7. Scheduling changes:  We request "advanced notification" of any changes or cancellations. 8. Advanced notification:  Defined as a time period of more than 24 hours prior to the originally scheduled appointment. This allows for the appointment to be offered to other patients. 9. Rescheduling:  When a visit is rescheduled, it will require the cancellation of the original appointment. For this reason they both fall within the category of "Cancellations".  10. Cancellations:  They require advanced notification. Any cancellation less than 24 hours before the  appointment will be recorded as a "No Show". 11. No Show:  Defined as an unkept appointment where the patient failed to notify or declare to the practice their intention or inability to keep the appointment.  Corrective process for repeat offenders:  1. Tardiness: Three (3) episodes of rescheduling due to late arrivals will be recorded as one (1) "No Show". 2. Cancellation or reschedule: Three (3) cancellations or rescheduling will be recorded as one (1) "No Show". 3. "No Shows": Three (3) "No Shows" within a 12 month period will result in discharge from the practice. ____________________________________________________________________________________________   ______________________________________________________________________________________________  Specialty Pain Scale  Introduction:  There are significant differences in how pain is reported. The word pain usually refers to physical pain, but it is also a common synonym of suffering. The medical community uses a scale from 0 (zero) to 10 (ten) to report pain level. Zero (0) is described as "no pain",  while ten (10) is described as "the worse pain  you can imagine". The problem with this scale is that physical pain is reported along with suffering. Suffering refers to mental pain, or more often yet it refers to any unpleasant feeling, emotion or aversion associated with the perception of harm or threat of harm. It is the psychological component of pain.  Pain Specialists prefer to separate the two components. The pain scale used by this practice is the Verbal Numerical Rating Scale (VNRS-11). This scale is for the physical pain only. DO NOT INCLUDE how your pain psychologically affects you. This scale is for adults 50 years of age and older. It has 11 (eleven) levels. The 1st level is 0/10. This means: "right now, I have no pain". In the context of pain management, it also means: "right now, my physical pain is under control with the current therapy".  General Information:  The scale should reflect your current level of pain. Unless you are specifically asked for the level of your worst pain, or your average pain. If you are asked for one of these two, then it should be understood that it is over the past 24 hours.  Levels 1 (one) through 5 (five) are described below, and can be treated as an outpatient. Ambulatory pain management facilities such as ours are more than adequate to treat these levels. Levels 6 (six) through 10 (ten) are also described below, however, these must be treated as a hospitalized patient. While levels 6 (six) and 7 (seven) may be evaluated at an urgent care facility, levels 8 (eight) through 10 (ten) constitute medical emergencies and as such, they belong in a hospital's emergency department. When having these levels (as described below), do not come to our office. Our facility is not equipped to manage these levels. Go directly to an urgent care facility or an emergency department to be evaluated.  Definitions:  Activities of Daily Living (ADL): Activities of daily living (ADL or ADLs) is a term used in healthcare to refer to  people's daily self-care activities. Health professionals often use a person's ability or inability to perform ADLs as a measurement of their functional status, particularly in regard to people post injury, with disabilities and the elderly. There are two ADL levels: Basic and Instrumental. Basic Activities of Daily Living (BADL  or BADLs) consist of self-care tasks that include: Bathing and showering; personal hygiene and grooming (including brushing/combing/styling hair); dressing; Toilet hygiene (getting to the toilet, cleaning oneself, and getting back up); eating and self-feeding (not including cooking or chewing and swallowing); functional mobility, often referred to as "transferring", as measured by the ability to walk, get in and out of bed, and get into and out of a chair; the broader definition (moving from one place to another while performing activities) is useful for people with different physical abilities who are still able to get around independently. Basic ADLs include the things many people do when they get up in the morning and get ready to go out of the house: get out of bed, go to the toilet, bathe, dress, groom, and eat. On the average, loss of function typically follows a particular order. Hygiene is the first to go, followed by loss of toilet use and locomotion. The last to go is the ability to eat. When there is only one remaining area in which the person is independent, there is a 62.9% chance that it is eating and only a 3.5% chance that it is hygiene. Instrumental Activities  of Daily Living (IADL or IADLs) are not necessary for fundamental functioning, but they let an individual live independently in a community. IADL consist of tasks that include: cleaning and maintaining the house; home establishment and maintenance; care of others (including selecting and supervising caregivers); care of pets; child rearing; managing money; managing financials (investments, etc.); meal preparation  and cleanup; shopping for groceries and necessities; moving within the community; safety procedures and emergency responses; health management and maintenance (taking prescribed medications); and using the telephone or other form of communication.  Instructions:  Most patients tend to report their pain as a combination of two factors, their physical pain and their psychosocial pain. This last one is also known as "suffering" and it is reflection of how physical pain affects you socially and psychologically. From now on, report them separately.  From this point on, when asked to report your pain level, report only your physical pain. Use the following table for reference.  Pain Clinic Pain Levels (0-5/10)  Pain Level Score  Description  No Pain 0   Mild pain 1 Nagging, annoying, but does not interfere with basic activities of daily living (ADL). Patients are able to eat, bathe, get dressed, toileting (being able to get on and off the toilet and perform personal hygiene functions), transfer (move in and out of bed or a chair without assistance), and maintain continence (able to control bladder and bowel functions). Blood pressure and heart rate are unaffected. A normal heart rate for a healthy adult ranges from 60 to 100 bpm (beats per minute).   Mild to moderate pain 2 Noticeable and distracting. Impossible to hide from other people. More frequent flare-ups. Still possible to adapt and function close to normal. It can be very annoying and may have occasional stronger flare-ups. With discipline, patients may get used to it and adapt.   Moderate pain 3 Interferes significantly with activities of daily living (ADL). It becomes difficult to feed, bathe, get dressed, get on and off the toilet or to perform personal hygiene functions. Difficult to get in and out of bed or a chair without assistance. Very distracting. With effort, it can be ignored when deeply involved in activities.   Moderately severe pain  4 Impossible to ignore for more than a few minutes. With effort, patients may still be able to manage work or participate in some social activities. Very difficult to concentrate. Signs of autonomic nervous system discharge are evident: dilated pupils (mydriasis); mild sweating (diaphoresis); sleep interference. Heart rate becomes elevated (>115 bpm). Diastolic blood pressure (lower number) rises above 100 mmHg. Patients find relief in laying down and not moving.   Severe pain 5 Intense and extremely unpleasant. Associated with frowning face and frequent crying. Pain overwhelms the senses.  Ability to do any activity or maintain social relationships becomes significantly limited. Conversation becomes difficult. Pacing back and forth is common, as getting into a comfortable position is nearly impossible. Pain wakes you up from deep sleep. Physical signs will be obvious: pupillary dilation; increased sweating; goosebumps; brisk reflexes; cold, clammy hands and feet; nausea, vomiting or dry heaves; loss of appetite; significant sleep disturbance with inability to fall asleep or to remain asleep. When persistent, significant weight loss is observed due to the complete loss of appetite and sleep deprivation.  Blood pressure and heart rate becomes significantly elevated. Caution: If elevated blood pressure triggers a pounding headache associated with blurred vision, then the patient should immediately seek attention at an urgent or emergency care unit, as  these may be signs of an impending stroke.    Emergency Department Pain Levels (6-10/10)  Emergency Room Pain 6 Severely limiting. Requires emergency care and should not be seen or managed at an outpatient pain management facility. Communication becomes difficult and requires great effort. Assistance to reach the emergency department may be required. Facial flushing and profuse sweating along with potentially dangerous increases in heart rate and blood pressure  will be evident.   Distressing pain 7 Self-care is very difficult. Assistance is required to transport, or use restroom. Assistance to reach the emergency department will be required. Tasks requiring coordination, such as bathing and getting dressed become very difficult.   Disabling pain 8 Self-care is no longer possible. At this level, pain is disabling. The individual is unable to do even the most "basic" activities such as walking, eating, bathing, dressing, transferring to a bed, or toileting. Fine motor skills are lost. It is difficult to think clearly.   Incapacitating pain 9 Pain becomes incapacitating. Thought processing is no longer possible. Difficult to remember your own name. Control of movement and coordination are lost.   The worst pain imaginable 10 At this level, most patients pass out from pain. When this level is reached, collapse of the autonomic nervous system occurs, leading to a sudden drop in blood pressure and heart rate. This in turn results in a temporary and dramatic drop in blood flow to the brain, leading to a loss of consciousness. Fainting is one of the body's self defense mechanisms. Passing out puts the brain in a calmed state and causes it to shut down for a while, in order to begin the healing process.    Summary: 1.   Refer to this scale when providing [place] with your pain level. 2.   Be accurate and careful when reporting your pain level. This will help with your care. 3.   Over-reporting your pain level will lead to loss of credibility. 4.   Even a level of 1/10 means that there is pain and will be treated at our facility. 5.   High, inaccurate reporting will be documented as "Symptom Exaggeration", leading to loss of credibility and suspicions of possible secondary gains such as obtaining more narcotics, or wanting to appear disabled, for fraudulent reasons. 6.   Only pain levels of 5 or below will be seen at our facility. 7.   Pain levels of 6 and above will be  sent to the Emergency Department and the appointment cancelled.  ______________________________________________________________________________________________

## 2018-06-04 LAB — COMP. METABOLIC PANEL (12)
AST: 23 IU/L (ref 0–40)
Albumin/Globulin Ratio: 2.1 (ref 1.2–2.2)
Albumin: 4.6 g/dL (ref 4.0–5.0)
Alkaline Phosphatase: 69 IU/L (ref 39–117)
BUN/Creatinine Ratio: 8 — ABNORMAL LOW (ref 9–20)
BUN: 6 mg/dL (ref 6–24)
Bilirubin Total: 0.3 mg/dL (ref 0.0–1.2)
Calcium: 9.6 mg/dL (ref 8.7–10.2)
Chloride: 102 mmol/L (ref 96–106)
Creatinine, Ser: 0.76 mg/dL (ref 0.76–1.27)
GFR calc Af Amer: 128 mL/min/{1.73_m2} (ref >59)
GFR calc non Af Amer: 111 mL/min/{1.73_m2} (ref >59)
Globulin, Total: 2.2 g/dL (ref 1.5–4.5)
Glucose: 101 mg/dL — ABNORMAL HIGH (ref 65–99)
Potassium: 3.9 mmol/L (ref 3.5–5.2)
Sodium: 140 mmol/L (ref 134–144)
Total Protein: 6.8 g/dL (ref 6.0–8.5)

## 2018-06-04 LAB — SEDIMENTATION RATE: Sed Rate: 21 mm/hr — ABNORMAL HIGH (ref 0–15)

## 2018-06-04 LAB — C-REACTIVE PROTEIN: CRP: 1 mg/L (ref 0–10)

## 2018-06-04 LAB — MAGNESIUM: MAGNESIUM: 2.1 mg/dL (ref 1.6–2.3)

## 2018-06-09 LAB — COMPLIANCE DRUG ANALYSIS, UR

## 2018-06-28 DIAGNOSIS — E538 Deficiency of other specified B group vitamins: Secondary | ICD-10-CM | POA: Insufficient documentation

## 2018-06-28 DIAGNOSIS — E559 Vitamin D deficiency, unspecified: Secondary | ICD-10-CM | POA: Insufficient documentation

## 2018-06-28 DIAGNOSIS — M47817 Spondylosis without myelopathy or radiculopathy, lumbosacral region: Secondary | ICD-10-CM | POA: Insufficient documentation

## 2018-06-28 NOTE — Progress Notes (Signed)
Patient's Name: Chad Wilson  MRN: [id number]  Referring Provider: Dorothyann Peng, NP  DOB: 02-Feb-1976  PCP: Dorothyann Peng, NP  DOS: 06/29/2018  Note by: [name], MD  Service setting: Virtual Visit (Telephone)  Attending: [name], MD  Location: Telephone Encounter  Specialty: Interventional Pain Management  Patient type: Established   Pain Management Virtual Encounter Note - Virtual Visit via Telephone Telehealth (real-time audio visits between healthcare provider and patient).  Patient's Phone No.:  [phone] (home); [phone] (mobile); (Preferred) [phone]  Pre-screening note:  Our staff contacted Chad Wilson and offered him an "in person", "face-to-face" appointment versus a telephone encounter. He indicated preferring the telephone encounter, at this time.   Primary Reason(s) for Virtual Visit: Encounter for evaluation before starting new chronic pain management plan of care (Level of risk: moderate) COVID-19*  Social distancing based on CDC ans AMA recommendations.    I contacted Chad Wilson on 06/29/2018 at 9:59 AM by telephone and clearly identified myself as [name], MD. I verified that I was speaking with the correct person using two identifiers (Name and date of birth: [id number]).  Advanced Informed Consent I sought verbal advanced consent from Chad Everette Willet for telemedicine interactions and virtual visit. I informed Chad Wilson of the security and privacy concerns, risks, and limitations associated with performing an evaluation and management service by telephone. I also informed Chad Wilson of the availability of "in person" appointments and I informed him of the possibility of a patient responsible charge related to this service. Chad Wilson expressed understanding and agreed to proceed.   Historic Elements   Mr. Chad Everette Coffield is a 43 y.o. year old, male patient evaluated today after his last encounter by our practice on  06/03/2018. Chad Wilson  has a past medical history of Acute hepatitis C without mention of hepatic coma(070.51), Alcohol abuse, unspecified, Anxiety and depression, Anxiety states, Essential hypertension, benign, Graves disease, Panic disorder without agoraphobia, Thyroid disease, and Tobacco use disorder. He also  has a past surgical history that includes Multiple tooth extractions (15 yrs. ago) and Anterior cervical decomp/discectomy fusion (N/A, 05/21/2017). Mr. Nelles has a current medication list which includes the following prescription(s): atorvastatin, citalopram, depo-testosterone, hydrocodone-acetaminophen, levothyroxine, lisinopril, lorazepam, sertraline, sildenafil, calcium carbonate, vitamin d3, vitamin b-12, ergocalciferol, magnesium, oxycodone, and omeprazole. He  reports that he has been smoking. He has a 30.00 pack-year smoking history. His smokeless tobacco use includes chew. He reports previous alcohol use. He reports that he does not use drugs. [name] is allergic to chantix [varenicline].   HPI  He is being evaluated for review of studies ordered on initial visit and to consider treatment plan options. Today I went over the results of his tests. These were explained in "Layman's terms". During today's appointment I went over my diagnostic impression, as well as the proposed treatment plan.  According to the patient his primary area of pain is in his lower back.  He feels like the right is equal to the left.  He denies any precipitating factors.  He denies any leg pain or weakness.  He denies any previous surgery.  He has had injections in the past however did not feel that they were effective.  He has not done any recent physical therapy however he does work full-time.  Has had a recent MRI.  The patient describes having had a steroid taper and also having had some injections in the lower back which he describes as not having been  done with any x-ray or ultrasound machine.  He also describes that  these were done at the orthopedic surgeons office.  The pain is primarily in the area of the lower back, bilaterally, with the left being worse than the right.  The pain goes down to the buttocks but he denies the pain going any further down the legs.  However, occasionally he will experience some right leg problems where he describes dragging his toes (dropfoot?)  Today I reviewed his medical records and he indicates that although he used to have a problem with excessive use of alcohol, he has not done that in many years and he describes having outgrown this.  He also indicates having Graves' disease for which he takes thyroid medicine.  He has had to take thyroid medicines after he had a thyroid radiation ablation.  He describes taking the benzodiazepine for panic attacks and he says averaging about 2 pills/day.  With regards to his medications he indicates taking the hydrocodone/APAP 5/325 4 times daily.  Today I have talked to him about this medication and also changing it to oxycodone 5 mg, without that Tylenol.  I have explained that the reason behind this this to minimize the stress on the liver as a consequence of the acetaminophen.  The patient also describes having had problems with his cervical spine for many years for which she had an anterior cervical discectomy and fusion surgery at [facility] on 05/21/2017.  He describes having done well with this, but now he is experiencing the low back pain issues.  In considering the treatment plan options, Chad Wilson was reminded that I no longer take patients for medication management only. I asked him to let me know if he had no intention of taking advantage of the interventional therapies, so that we could make arrangements to provide this space to someone interested. I also made it clear that undergoing interventional therapies for the purpose of getting pain medications is very inappropriate on the part of a patient, and it will not be tolerated in this practice.  This type of behavior would suggest true addiction and therefore it requires referral to an addiction specialist.   I discussed the assessment and treatment plan with the patient. The patient was provided an opportunity to ask questions and all were answered. The patient agreed with the plan and demonstrated an understanding of the instructions.  Patient advised to call back or seek an in-person evaluation if the symptoms or condition worsens.  Controlled Substance Pharmacotherapy Assessment REMS (Risk Evaluation and Mitigation Strategy)  Analgesic: Hydrocodone/acetaminophen 5/325 mg 1 tablet 4 times daily (fill date 06/10/2018) hydrocodone 20 mg/day Highest recorded MME/day: 60 mg/day MME/day: 20 mg/day   Monitoring: [place] PMP: PDMP reviewed during this encounter.       Not applicable at this point since we have not taken over the patient's medication management yet. List of other Serum/Urine Drug Screening Test(s):  Lab Results  Component Value Date   COCAINSCRNUR NONE DETECTED 09/29/2009   COCAINSCRNUR POSITIVE (A) 09/08/2007   THCU NONE DETECTED 09/29/2009   THCU NONE DETECTED 09/08/2007   ETH <5 07/09/2016   ETH  09/29/2009    <5        LOWEST DETECTABLE LIMIT FOR SERUM ALCOHOL IS 5 mg/dL FOR MEDICAL PURPOSES ONLY   ETH (H) 09/08/2007    156        LOWEST DETECTABLE LIMIT FOR SERUM ALCOHOL IS 11 mg/dL FOR MEDICAL PURPOSES ONLY   List of all UDS  test(s) done:  Lab Results  Component Value Date   SUMMARY FINAL 06/03/2018   Last UDS on record: Summary  Date Value Ref Range Status  06/03/2018 FINAL  Final    Comment:    ==================================================================== TOXASSURE COMP DRUG ANALYSIS,UR ==================================================================== Test                             Result       Flag       Units Drug Present   Lorazepam                      219                     ng/mg creat    Source of lorazepam is a scheduled  prescription medication.   Hydrocodone                    1871                    ng/mg creat   Hydromorphone                  463                     ng/mg creat   Dihydrocodeine                 252                     ng/mg creat   Norhydrocodone                 1617                    ng/mg creat    Sources of hydrocodone include scheduled prescription    medications. Hydromorphone, dihydrocodeine and norhydrocodone are    expected metabolites of hydrocodone. Hydromorphone and    dihydrocodeine are also available as scheduled prescription    medications.   Zolpidem                       PRESENT   Zolpidem Acid                  PRESENT    Zolpidem acid is an expected metabolite of zolpidem.   Sertraline                     PRESENT   Desmethylsertraline            PRESENT    Desmethylsertraline is an expected metabolite of sertraline.   Acetaminophen                  PRESENT ==================================================================== Test                      Result    Flag   Units      Ref Range   Creatinine              52               mg/dL      >=20 ==================================================================== Declared Medications:  Medication list was not provided. ==================================================================== For clinical consultation, please call [phone]. ====================================================================    UDS interpretation: No unexpected findings.  Medication Assessment Form: Patient introduced to form today Treatment compliance: Treatment may start today if patient agrees with proposed plan. Evaluation of compliance is not applicable at this point Risk Assessment Profile: Aberrant behavior: See initial evaluations. None observed or detected today Comorbid factors increasing risk of overdose: See initial evaluation. No additional risks detected today Opioid risk tool (ORT):  Opioid Risk  06/03/2018   Alcohol 0  Illegal Drugs 0  Rx Drugs 0  Alcohol 0  Illegal Drugs 0  Rx Drugs 0  Age between 16-45 years  1  History of Preadolescent Sexual Abuse 0  Psychological Disease 2  ADD Negative  OCD Negative  Bipolar Negative  Depression 1  Opioid Risk Tool Scoring 4  Opioid Risk Interpretation Moderate Risk    ORT Scoring interpretation table:  Score <3 = Low Risk for SUD  Score between 4-7 = Moderate Risk for SUD  Score >8 = High Risk for Opioid Abuse   Risk of substance use disorder (SUD): Low  Risk Mitigation Strategies:  Patient opioid safety counseling: Completed today. Counseling provided to patient as per "Patient Counseling Document". Document signed by patient, attesting to counseling and understanding Patient-Prescriber Agreement (PPA): Obtained today.  Controlled substance notification to other providers: Written and sent today.  Pharmacologic Plan: Today we may be taking over the patient's pharmacological regimen. See below.             Meds   Current Outpatient Medications:  .  atorvastatin (LIPITOR) 20 MG tablet, TAKE 1 TABLET (20 MG TOTAL) BY MOUTH DAILY., Disp: 90 tablet, Rfl: 1 .  citalopram (CELEXA) 40 MG tablet, Take 40 mg by mouth daily., Disp: , Rfl:  .  DEPO-TESTOSTERONE 200 MG/ML injection, Take 200 mg by mouth every 14 (fourteen) days., Disp: , Rfl:  .  HYDROcodone-acetaminophen (NORCO) 10-325 MG tablet, Take 1 tablet by mouth every 6 (six) hours., Disp: , Rfl:  .  levothyroxine (SYNTHROID, LEVOTHROID) 150 MCG tablet, Take 1 tablet (150 mcg total) by mouth daily., Disp: 90 tablet, Rfl: 3 .  lisinopril (PRINIVIL,ZESTRIL) 40 MG tablet, TAKE 1 TABLET BY MOUTH ONCE DAILY, Disp: 90 tablet, Rfl: 1 .  LORazepam (ATIVAN) 0.5 MG tablet, Take 1 tablet (0.5 mg total) by mouth every 8 (eight) hours as needed for anxiety., Disp: 30 tablet, Rfl: 1 .  sertraline (ZOLOFT) 100 MG tablet, TAKE 1 TABLET (100 MG TOTAL) BY MOUTH DAILY., Disp: 90 tablet, Rfl: 1 .  sildenafil  (VIAGRA) 100 MG tablet, Take 1 tablet (100 mg total) by mouth daily as needed for erectile dysfunction., Disp: 10 tablet, Rfl: 11 .  calcium carbonate (CALCIUM 600) 600 MG TABS tablet, Take 1 tablet (600 mg total) by mouth 2 (two) times daily with a meal for 30 days., Disp: 60 tablet, Rfl: 0 .  Cholecalciferol (VITAMIN D3) 125 MCG (5000 UT) CAPS, Take 1 capsule (5,000 Units total) by mouth daily with breakfast. Take along with calcium and magnesium., Disp: 30 capsule, Rfl: 5 .  Cyanocobalamin (VITAMIN B-12) 5000 MCG SUBL, Place 1 tablet (5,000 mcg total) under the tongue daily for 30 days., Disp: 30 each, Rfl: 0 .  ergocalciferol (VITAMIN D2) 1.25 MG (50000 UT) capsule, Take 1 capsule (50,000 Units total) by mouth 2 (two) times a week. X 6 weeks., Disp: 12 capsule, Rfl: 0 .  Magnesium 500 MG CAPS, Take 1 capsule (500 mg total) by mouth 2 (two) times daily at 8 am and 10 pm., Disp: 60 capsule, Rfl: 5 .  oxyCODONE (OXY  IR/ROXICODONE) 5 MG immediate release tablet, Take 1 tablet (5 mg total) by mouth every 6 (six) hours as needed for up to 30 days for severe pain. Must last 30 days., Disp: 120 tablet, Rfl: 0  Laboratory Chemistry  Inflammation Markers (CRP: Acute Phase) (ESR: Chronic Phase) Lab Results  Component Value Date   CRP 1 06/03/2018   ESRSEDRATE 21 (H) 06/03/2018                         Rheumatology Markers No results found.  Renal Function Markers Lab Results  Component Value Date   BUN 6 06/03/2018   CREATININE 0.76 06/03/2018   BCR 8 (L) 06/03/2018   GFRAA 128 06/03/2018   GFRNONAA 111 06/03/2018                             Hepatic Function Markers Lab Results  Component Value Date   AST 23 06/03/2018   ALT 25 10/17/2017   ALBUMIN 4.6 06/03/2018   ALKPHOS 69 06/03/2018   HCVAB (A) 09/29/2009    Reactive (NOTE) Result repeated and verified.                                                                       This test is for screening purposes only.  Reactive results  should be confirmed by an alternative method.  Suggest HCV Qualitative, PCR, test code  83130.  Specimens will be stable for reflex testing up to 3 days after collection.   LIPASE 34 12/06/2010                        Electrolytes Lab Results  Component Value Date   NA 140 06/03/2018   K 3.9 06/03/2018   CL 102 06/03/2018   CALCIUM 9.6 06/03/2018   MG 2.1 06/03/2018                        Neuropathy Markers Lab Results  Component Value Date   VITAMINB12 147 (L) 02/17/2018   HGBA1C 5.9 10/17/2017                        CNS Tests No results found.  Bone Pathology Markers Lab Results  Component Value Date   VD25OH 21.01 (L) 02/17/2018   TESTOFREE See below 02/17/2018   TESTOSTERONE 147 (L) 02/17/2018                         Coagulation Parameters Lab Results  Component Value Date   INR 0.99 05/07/2017   LABPROT 13.0 05/07/2017   APTT 28 02/27/2010   PLT 223.0 02/17/2018                        Cardiovascular Markers Lab Results  Component Value Date   HGB 15.3 02/17/2018   HCT 43.1 02/17/2018                         ID Markers No results found.  CA Markers No results found.  Endocrine Markers Lab Results  Component Value Date   TSH 2.98 02/17/2018   FREET4 0.69 02/17/2018   TESTOFREE See below 02/17/2018   TESTOSTERONE 147 (L) 02/17/2018   SHBG 20 02/17/2018                        Note: Lab results reviewed.  Recent Diagnostic Imaging Review  Cervical Imaging: Cervical MR wo contrast:  Results for orders placed during the hospital encounter of 02/02/17  MR Cervical Spine w/o contrast   Narrative CLINICAL DATA:  Fall 3 weeks ago. Neck pain and stiffness. Bilateral arm weakness. In  EXAM: MRI CERVICAL SPINE WITHOUT CONTRAST  TECHNIQUE: Multiplanar, multisequence MR imaging of the cervical spine was performed. No intravenous contrast was administered.  COMPARISON:  Cervical spine radiographs 12/31/2016  FINDINGS: Alignment: Grade 1  degenerative retrolisthesis CT C6-7 measures 2 mm. AP alignment is otherwise anatomic. There straightening of the normal cervical lordosis.  Vertebrae: Marrow signal and vertebral body heights are normal.  Cord: T2 hyperintensity is evident in the right lateral aspect of cord at the C6-7 level extending 8 mm cephalo caudad. Normal signal is present through the remainder of the cervical spinal cord.  Posterior Fossa, vertebral arteries, paraspinal tissues: The craniocervical junction is normal. The visualized intracranial contents are normal. Flow is present in the vertebral arteries bilaterally.  Disc levels:  C2-3:  Negative.  C3-4: Bilateral uncovertebral spurring and mild foraminal stenosis is present. The central canal is patent.  C4-5: A mild broad-based disc osteophyte complex is present. There is partial effacement of ventral CSF. Facet hypertrophy is worse on the left. Moderate left and mild right foraminal narrowing is present.  C5-6: A large soft disc right paramedian disc extrusion contacts and distorts the spinal cord. The canal is narrowed to 5.5 mm on the right. Moderate left and mild right foraminal narrowing is secondary to a uncovertebral spurring.  C6-7: A broad-based rightward disc osteophyte complex is present. There is a significant soft disc component. This results in severe central canal narrowing. The canal is narrowed to 6.5 mm. Abnormal T2 signal is present on the right. Severe foraminal stenosis is present bilaterally, right greater than left.  C7-T1: Asymmetric moderate right-sided facet hypertrophy is present without significant stenosis.  IMPRESSION: 1. Prominent right paramedian soft disc protrusion contacts and distorts the central and right paramedian cord at the C5-6 level with severe central canal stenosis. 2. Moderate left and mild right foraminal stenosis at C5-6 secondary to uncovertebral spurring. 3. Broad-based rightward disc  osteophyte complex with significant soft disc component results in severe right central canal stenosis and abnormal cord signal, likely edema. 4. Severe foraminal stenosis at C6-7 is worse on the right. 5. Mild central canal narrowing at C4-5 with moderate left and mild right foraminal stenosis. 6. Mild foraminal narrowing bilaterally at C3-4. 7. Asymmetric right-sided facet hypertrophy at C7-T1 without significant stenosis.   Electronically Signed   By: San Morelle M.D.   On: 02/02/2017 13:46    Cervical DG 2-3 views:  Results for orders placed during the hospital encounter of 05/21/17  DG Cervical Spine 2-3 Views   Narrative CLINICAL DATA:  C5-7 ACDF  EXAM: DG C-ARM 61-120 MIN; CERVICAL SPINE - 2-3 VIEW  COMPARISON:  Cervical spine MRI 02/02/2017  FINDINGS: Three images from intraoperative fluoroscopy obtained during C5-C7 ACDF. 18 seconds of fluoroscopy time was reported.  IMPRESSION: Intraoperative fluoroscopy.   Electronically Signed   By: Cletus Gash.D.  On: 05/21/2017 15:25    Cervical DG complete:  Results for orders placed during the hospital encounter of 09/06/11  DG Cervical Spine Complete   Narrative *RADIOLOGY REPORT*  Clinical Data: Right-sided neck pain radiating to the right hand  CERVICAL SPINE - COMPLETE 4+ VIEW  Comparison: None  Findings: The cervical vertebrae are straightened in alignment. There is degenerative disc disease at C6-7 with loss of disc space and spurring.  There is very minimal retrolisthesis of C6 on C7 by approximately 2 mm.  This slight retrolisthesis most likely is due to degenerative change. There is mild foraminal narrowing bilaterally at the  C6-7 level.  The odontoid process is intact. The lung apices are clear.  IMPRESSION:  1.  Straightened alignment with degenerative disc disease at C6-7 with some foraminal narrowing. 2.  2 mm retrolisthesis of C6 on C7 by 2 mm most  likely degenerative.  Original Report Authenticated By: Joretta Bachelor, M.D.   Lumbosacral Imaging: Lumbar MR wo contrast:  Results for orders placed during the hospital encounter of 04/24/18  MR LUMBAR SPINE WO CONTRAST   Narrative CLINICAL DATA:  43 year old male with 2 months of low back pain. No known injury.  EXAM: MRI LUMBAR SPINE WITHOUT CONTRAST  TECHNIQUE: Multiplanar, multisequence MR imaging of the lumbar spine was performed. No intravenous contrast was administered.  COMPARISON:  Lumbar MRI 02/02/2017.  Lumbar radiographs 12/31/2016.  FINDINGS: Segmentation: Normal on the comparison radiographs which is the same numbering system used on the 2018 MRI.  Alignment: Stable lumbar lordosis. Subtle retrolisthesis of L5 on S1.  Vertebrae: Subtle degenerative endplate marrow edema anteriorly at L3-L4, associated with chronic degenerative endplate spurring. Normal background bone marrow signal. No other marrow edema or acute osseous abnormality. Intact visible sacrum and SI joints.  Conus medullaris and cauda equina: Conus extends to the L1 level. No lower spinal cord or conus signal abnormality.  Paraspinal and other soft tissues: Negative.  Disc levels:  T11-T12: Mild disc desiccation.  T12-L1:  Negative.  L1-L2:  Negative.  L2-L3:  Minimal disc bulging and facet hypertrophy.  No stenosis.  L3-L4: Mild disc desiccation and circumferential disc bulge. Mild facet hypertrophy with trace degenerative facet joint fluid. No spinal or lateral recess stenosis. Mild bilateral L3 foraminal stenosis is stable.  L4-L5: Mild far lateral disc bulging and endplate spurring. Mild facet hypertrophy. Trace facet joint fluid and occasional posterior small synovial cysts. No spinal or lateral recess stenosis. Mild bilateral L4 foraminal stenosis is stable.  L5-S1:  Borderline to mild facet hypertrophy.  No stenosis.  IMPRESSION: Stable MRI appearance of the lumbar spine  since 2018. Mild degenerative disc bulging. Multilevel mild facet hypertrophy.  No spinal or lateral recess stenosis. Mild bilateral L3 and L4 foraminal stenosis.   Electronically Signed   By: Genevie Ann M.D.   On: 04/24/2018 20:30    Foot Imaging: Foot-R DG Complete:  Results for orders placed in visit on 03/27/18  DG Foot Complete Right   Narrative Please see detailed radiograph report in office note.   Complexity Note: Imaging results reviewed. Results shared with Mr. Mangrum, using Layman's terms.                         Assessment  The primary encounter diagnosis was Chronic pain syndrome. Diagnoses of Chronic low back pain (Primary area of Pain) (Bilateral) (R=L) w/o sciatica, Spondylosis without myelopathy or radiculopathy, lumbosacral region, DDD (degenerative disc disease), lumbosacral, Grade 1 Lumbosacral Retrolisthesis  of L5/S1, Lumbar facet hypertrophy (Bilateral), Lumbar facet syndrome, Osteoarthritis of facet joint of lumbar spine, Osteoarthritis of lumbar spine, Lumbar spondylosis, Abnormal MRI, lumbar spine, Disorder of skeletal system, Vitamin B12 deficiency, Vitamin D insufficiency, Pharmacologic therapy, Long term prescription benzodiazepine use, and Long term current use of opiate analgesic were also pertinent to this visit.  Plan of Care  I am having Chad E. Minix start on Vitamin B-12, ergocalciferol, Vitamin D3, Magnesium, calcium carbonate, and oxyCODONE. I am also having him maintain his sildenafil, levothyroxine, LORazepam, lisinopril, atorvastatin, sertraline, citalopram, HYDROcodone-acetaminophen, and Depo-Testosterone. Pharmacotherapy (Medications Ordered): Meds ordered this encounter  Medications  . Cyanocobalamin (VITAMIN B-12) 5000 MCG SUBL    Sig: Place 1 tablet (5,000 mcg total) under the tongue daily for 30 days.    Dispense:  30 each    Refill:  0    Do not place medication on "Automatic Refill". Fill one day early if pharmacy is closed on scheduled  refill date.  . ergocalciferol (VITAMIN D2) 1.25 MG (50000 UT) capsule    Sig: Take 1 capsule (50,000 Units total) by mouth 2 (two) times a week. X 6 weeks.    Dispense:  12 capsule    Refill:  0    Do not add this medication to the electronic "Automatic Refill" notification system. Patient may have prescription filled one day early if pharmacy is closed on scheduled refill date.  . Cholecalciferol (VITAMIN D3) 125 MCG (5000 UT) CAPS    Sig: Take 1 capsule (5,000 Units total) by mouth daily with breakfast. Take along with calcium and magnesium.    Dispense:  30 capsule    Refill:  5    May substitute with similar over-the-counter product.  . Magnesium 500 MG CAPS    Sig: Take 1 capsule (500 mg total) by mouth 2 (two) times daily at 8 am and 10 pm.    Dispense:  60 capsule    Refill:  5    May substitute with similar over-the-counter product.  . calcium carbonate (CALCIUM 600) 600 MG TABS tablet    Sig: Take 1 tablet (600 mg total) by mouth 2 (two) times daily with a meal for 30 days.    Dispense:  60 tablet    Refill:  0    May substitute with similar over-the-counter product.  Marland Kitchen oxyCODONE (OXY IR/ROXICODONE) 5 MG immediate release tablet    Sig: Take 1 tablet (5 mg total) by mouth every 6 (six) hours as needed for up to 30 days for severe pain. Must last 30 days.    Dispense:  120 tablet    Refill:  0     STOP ACT - Not applicable. Fill one day early if pharmacy is closed on scheduled refill date. Do not fill until: 06/29/18 . Must last 30 days. To last until: 07/29/18.    Procedure Orders     LUMBAR FACET(MEDIAL BRANCH NERVE BLOCK) MBNB Lab Orders  No laboratory test(s) ordered today   Imaging Orders  No imaging studies ordered today   Referral Orders  No referral(s) requested today    Orders:  Orders Placed This Encounter  Procedures  . LUMBAR FACET(MEDIAL BRANCH NERVE BLOCK) MBNB    Standing Status:   Future    Standing Expiration Date:   07/28/2018    Scheduling  Instructions:     Side: Bilateral     Level: L3-4, L4-5, & L5-S1 Facets (L2, L3, L4, L5, & S1 Medial Branch Nerves)     Sedation:  Patient's choice.     Timeframe: ASAA    Order Specific Question:   Where will this procedure be performed?    Answer:   [facility]   Pharmacological management options:  Opioid Analgesics: Today I will switch him from hydrocodone/APAP 5/325 1 tablet p.o. 4 times daily to oxycodone IR 5 mg 1 tablet p.o. 4 times daily. Membrane stabilizer: Options discussed, including a trial. Muscle relaxant: We have discussed the possibility of a trial NSAID: Trial discussed. Other analgesic(s): To be determined at a later time   Interventional management options: Planned, scheduled, and/or pending:    Diagnostic bilateral lumbar facet block #1 under fluoroscopic guidance and IV sedation.  This is to be scheduled after the COVID-19 restrictions are lifted.  Meanwhile, we will continue the patient on pain medication, which we will be switching from the hydrocodone/APAP 5/325 2 oxycodone IR 5 mg 4 times daily.  He was instructed to come by the office to pick up the prescription and also to sign her medication agreement.  He has indicated that he has access to "my chart" and therefore I have provided him with important information in the after visit summary.   Considering:   Diagnostic bilateral lumbar facet nerve block  Possible bilateral lumbar facet RFA    PRN Procedures:   None at this time   Total duration of non-face-to-face encounter: 45 minutes.  Follow-up plan:   Return for Procedure (w/ sedation): (B) L-FCT #1 +  Med-Mgmt, w/ Dr. Dossie Arbour (3mo.    No future appointments.  Primary Care Physician: NDorothyann Peng NP Location: Telephone Virtual Visit Note by: FGaspar Cola MD Date: 06/29/2018; Time: 9:59 AM  Disclaimer:  * Given the special circumstances of the COVID-19 pandemic, the federal government has announced that the Office for Civil  Rights (OCR) will exercise its enforcement discretion and will not impose penalties on physicians using telehealth in the event of noncompliance with regulatory requirements under the [place] Account[place] Act (HIPAA) in connection with the good faith provision of telehealth during the [id number] public health emergency. (APitts

## 2018-06-29 ENCOUNTER — Other Ambulatory Visit: Payer: Self-pay

## 2018-06-29 ENCOUNTER — Ambulatory Visit: Payer: 59 | Attending: Pain Medicine | Admitting: Pain Medicine

## 2018-06-29 ENCOUNTER — Encounter: Payer: Self-pay | Admitting: Adult Health

## 2018-06-29 DIAGNOSIS — Z79899 Other long term (current) drug therapy: Secondary | ICD-10-CM | POA: Insufficient documentation

## 2018-06-29 DIAGNOSIS — Z79891 Long term (current) use of opiate analgesic: Secondary | ICD-10-CM

## 2018-06-29 DIAGNOSIS — M545 Low back pain, unspecified: Secondary | ICD-10-CM

## 2018-06-29 DIAGNOSIS — G894 Chronic pain syndrome: Secondary | ICD-10-CM | POA: Diagnosis not present

## 2018-06-29 DIAGNOSIS — M5030 Other cervical disc degeneration, unspecified cervical region: Secondary | ICD-10-CM | POA: Insufficient documentation

## 2018-06-29 DIAGNOSIS — G8929 Other chronic pain: Secondary | ICD-10-CM

## 2018-06-29 DIAGNOSIS — M899 Disorder of bone, unspecified: Secondary | ICD-10-CM

## 2018-06-29 DIAGNOSIS — R937 Abnormal findings on diagnostic imaging of other parts of musculoskeletal system: Secondary | ICD-10-CM | POA: Insufficient documentation

## 2018-06-29 DIAGNOSIS — E538 Deficiency of other specified B group vitamins: Secondary | ICD-10-CM | POA: Diagnosis not present

## 2018-06-29 DIAGNOSIS — M5137 Other intervertebral disc degeneration, lumbosacral region: Secondary | ICD-10-CM | POA: Diagnosis not present

## 2018-06-29 DIAGNOSIS — M4310 Spondylolisthesis, site unspecified: Secondary | ICD-10-CM | POA: Insufficient documentation

## 2018-06-29 DIAGNOSIS — M47816 Spondylosis without myelopathy or radiculopathy, lumbar region: Secondary | ICD-10-CM | POA: Insufficient documentation

## 2018-06-29 DIAGNOSIS — M542 Cervicalgia: Secondary | ICD-10-CM | POA: Insufficient documentation

## 2018-06-29 DIAGNOSIS — M47817 Spondylosis without myelopathy or radiculopathy, lumbosacral region: Secondary | ICD-10-CM

## 2018-06-29 DIAGNOSIS — E559 Vitamin D deficiency, unspecified: Secondary | ICD-10-CM

## 2018-06-29 MED ORDER — VITAMIN D3 125 MCG (5000 UT) PO CAPS: 1.0000 | ORAL_CAPSULE | Freq: Every day | ORAL | 5 refills | Status: AC

## 2018-06-29 MED ORDER — ERGOCALCIFEROL 1.25 MG (50000 UT) PO CAPS: 50000.0000 [IU] | ORAL_CAPSULE | ORAL | 0 refills | Status: AC

## 2018-06-29 MED ORDER — OXYCODONE HCL 5 MG PO TABS: 5.0000 mg | ORAL_TABLET | Freq: Four times a day (QID) | ORAL | 0 refills | Status: DC | PRN

## 2018-06-29 MED ORDER — CALCIUM CARBONATE 600 MG PO TABS: 600.0000 mg | ORAL_TABLET | Freq: Two times a day (BID) | ORAL | 0 refills | Status: DC

## 2018-06-29 MED ORDER — MAGNESIUM 500 MG PO CAPS: 500.0000 mg | ORAL_CAPSULE | Freq: Two times a day (BID) | ORAL | 5 refills | Status: DC

## 2018-06-29 MED ORDER — VITAMIN B-12 5000 MCG SL SUBL: 5000.0000 ug | SUBLINGUAL_TABLET | Freq: Every day | SUBLINGUAL | 0 refills | Status: AC

## 2018-06-29 MED FILL — VIT D2 1.25 MG (50,000 UNIT: 1.25 MG | 42 days supply | Qty: 12 | Fill #0

## 2018-06-29 NOTE — Patient Instructions (Addendum)
______________________________________________________________________________________________  Specialty Pain Scale  Introduction:  There are significant differences in how pain is reported. The word pain usually refers to physical pain, but it is also a common synonym of suffering. The medical community uses a scale from 0 (zero) to 10 (ten) to report pain level. Zero (0) is described as "no pain", while ten (10) is described as "the worse pain you can imagine". The problem with this scale is that physical pain is reported along with suffering. Suffering refers to mental pain, or more often yet it refers to any unpleasant feeling, emotion or aversion associated with the perception of harm or threat of harm. It is the psychological component of pain.  Pain Specialists prefer to separate the two components. The pain scale used by this practice is the Verbal Numerical Rating Scale (VNRS-11). This scale is for the physical pain only. DO NOT INCLUDE how your pain psychologically affects you. This scale is for adults 36 years of age and older. It has 11 (eleven) levels. The 1st level is 0/10. This means: "right now, I have no pain". In the context of pain management, it also means: "right now, my physical pain is under control with the current therapy".  General Information:  The scale should reflect your current level of pain. Unless you are specifically asked for the level of your worst pain, or your average pain. If you are asked for one of these two, then it should be understood that it is over the past 24 hours.  Levels 1 (one) through 5 (five) are described below, and can be treated as an outpatient. Ambulatory pain management facilities such as ours are more than adequate to treat these levels. Levels 6 (six) through 10 (ten) are also described below, however, these must be treated as a hospitalized patient. While levels 6 (six) and 7 (seven) may be evaluated at an urgent care facility, levels 8  (eight) through 10 (ten) constitute medical emergencies and as such, they belong in a hospital's emergency department. When having these levels (as described below), do not come to our office. Our facility is not equipped to manage these levels. Go directly to an urgent care facility or an emergency department to be evaluated.  Definitions:  Activities of Daily Living (ADL): Activities of daily living (ADL or ADLs) is a term used in healthcare to refer to people's daily self-care activities. Health professionals often use a person's ability or inability to perform ADLs as a measurement of their functional status, particularly in regard to people post injury, with disabilities and the elderly. There are two ADL levels: Basic and Instrumental. Basic Activities of Daily Living (BADL  or BADLs) consist of self-care tasks that include: Bathing and showering; personal hygiene and grooming (including brushing/combing/styling hair); dressing; Toilet hygiene (getting to the toilet, cleaning oneself, and getting back up); eating and self-feeding (not including cooking or chewing and swallowing); functional mobility, often referred to as "transferring", as measured by the ability to walk, get in and out of bed, and get into and out of a chair; the broader definition (moving from one place to another while performing activities) is useful for people with different physical abilities who are still able to get around independently. Basic ADLs include the things many people do when they get up in the morning and get ready to go out of the house: get out of bed, go to the toilet, bathe, dress, groom, and eat. On the average, loss of function typically follows a particular order.  Hygiene is the first to go, followed by loss of toilet use and locomotion. The last to go is the ability to eat. When there is only one remaining area in which the person is independent, there is a 62.9% chance that it is eating and only a 3.5% chance  that it is hygiene. Instrumental Activities of Daily Living (IADL or IADLs) are not necessary for fundamental functioning, but they let an individual live independently in a community. IADL consist of tasks that include: cleaning and maintaining the house; home establishment and maintenance; care of others (including selecting and supervising caregivers); care of pets; child rearing; managing money; managing financials (investments, etc.); meal preparation and cleanup; shopping for groceries and necessities; moving within the community; safety procedures and emergency responses; health management and maintenance (taking prescribed medications); and using the telephone or other form of communication.  Instructions:  Most patients tend to report their pain as a combination of two factors, their physical pain and their psychosocial pain. This last one is also known as "suffering" and it is reflection of how physical pain affects you socially and psychologically. From now on, report them separately.  From this point on, when asked to report your pain level, report only your physical pain. Use the following table for reference.  Pain Clinic Pain Levels (0-5/10)  Pain Level Score  Description  No Pain 0   Mild pain 1 Nagging, annoying, but does not interfere with basic activities of daily living (ADL). Patients are able to eat, bathe, get dressed, toileting (being able to get on and off the toilet and perform personal hygiene functions), transfer (move in and out of bed or a chair without assistance), and maintain continence (able to control bladder and bowel functions). Blood pressure and heart rate are unaffected. A normal heart rate for a healthy adult ranges from 60 to 100 bpm (beats per minute).   Mild to moderate pain 2 Noticeable and distracting. Impossible to hide from other people. More frequent flare-ups. Still possible to adapt and function close to normal. It can be very annoying and may have  occasional stronger flare-ups. With discipline, patients may get used to it and adapt.   Moderate pain 3 Interferes significantly with activities of daily living (ADL). It becomes difficult to feed, bathe, get dressed, get on and off the toilet or to perform personal hygiene functions. Difficult to get in and out of bed or a chair without assistance. Very distracting. With effort, it can be ignored when deeply involved in activities.   Moderately severe pain 4 Impossible to ignore for more than a few minutes. With effort, patients may still be able to manage work or participate in some social activities. Very difficult to concentrate. Signs of autonomic nervous system discharge are evident: dilated pupils (mydriasis); mild sweating (diaphoresis); sleep interference. Heart rate becomes elevated (>115 bpm). Diastolic blood pressure (lower number) rises above 100 mmHg. Patients find relief in laying down and not moving.   Severe pain 5 Intense and extremely unpleasant. Associated with frowning face and frequent crying. Pain overwhelms the senses.  Ability to do any activity or maintain social relationships becomes significantly limited. Conversation becomes difficult. Pacing back and forth is common, as getting into a comfortable position is nearly impossible. Pain wakes you up from deep sleep. Physical signs will be obvious: pupillary dilation; increased sweating; goosebumps; brisk reflexes; cold, clammy hands and feet; nausea, vomiting or dry heaves; loss of appetite; significant sleep disturbance with inability to fall asleep or to  remain asleep. When persistent, significant weight loss is observed due to the complete loss of appetite and sleep deprivation.  Blood pressure and heart rate becomes significantly elevated. Caution: If elevated blood pressure triggers a pounding headache associated with blurred vision, then the patient should immediately seek attention at an urgent or emergency care unit, as  these may be signs of an impending stroke.    Emergency Department Pain Levels (6-10/10)  Emergency Room Pain 6 Severely limiting. Requires emergency care and should not be seen or managed at an outpatient pain management facility. Communication becomes difficult and requires great effort. Assistance to reach the emergency department may be required. Facial flushing and profuse sweating along with potentially dangerous increases in heart rate and blood pressure will be evident.   Distressing pain 7 Self-care is very difficult. Assistance is required to transport, or use restroom. Assistance to reach the emergency department will be required. Tasks requiring coordination, such as bathing and getting dressed become very difficult.   Disabling pain 8 Self-care is no longer possible. At this level, pain is disabling. The individual is unable to do even the most "basic" activities such as walking, eating, bathing, dressing, transferring to a bed, or toileting. Fine motor skills are lost. It is difficult to think clearly.   Incapacitating pain 9 Pain becomes incapacitating. Thought processing is no longer possible. Difficult to remember your own name. Control of movement and coordination are lost.   The worst pain imaginable 10 At this level, most patients pass out from pain. When this level is reached, collapse of the autonomic nervous system occurs, leading to a sudden drop in blood pressure and heart rate. This in turn results in a temporary and dramatic drop in blood flow to the brain, leading to a loss of consciousness. Fainting is one of the body's self defense mechanisms. Passing out puts the brain in a calmed state and causes it to shut down for a while, in order to begin the healing process.    Summary: 1.   Refer to this scale when providing [place] with your pain level. 2.   Be accurate and careful when reporting your pain level. This will help with your care. 3.   Over-reporting your pain level  will lead to loss of credibility. 4.   Even a level of 1/10 means that there is pain and will be treated at our facility. 5.   High, inaccurate reporting will be documented as "Symptom Exaggeration", leading to loss of credibility and suspicions of possible secondary gains such as obtaining more narcotics, or wanting to appear disabled, for fraudulent reasons. 6.   Only pain levels of 5 or below will be seen at our facility. 7.   Pain levels of 6 and above will be sent to the Emergency Department and the appointment cancelled.  ______________________________________________________________________________________________    ____________________________________________________________________________________________  Preparing for Procedure with Sedation  Procedure appointments are limited to planned procedures: . No Prescription Refills. . No disability issues will be discussed. . No medication changes will be discussed.  Instructions: . Oral Intake: Do not eat or drink anything for at least 8 hours prior to your procedure. . Transportation: Public transportation is not allowed. Bring an adult driver. The driver must be physically present in our waiting room before any procedure can be started. Marland Kitchen Physical Assistance: Bring an adult physically capable of assisting you, in the event you need help. This adult should keep you company at home for at least 6 hours after the  procedure. . Blood Pressure Medicine: Take your blood pressure medicine with a sip of water the morning of the procedure. . Blood thinners: Notify our staff if you are taking any blood thinners. Depending on which one you take, there will be specific instructions on how and when to stop it. . Diabetics on insulin: Notify the staff so that you can be scheduled 1st case in the morning. If your diabetes requires high dose insulin, take only  of your normal insulin dose the morning of the procedure and notify the staff that you  have done so. . Preventing infections: Shower with an antibacterial soap the morning of your procedure. . Build-up your immune system: Take 1000 mg of Vitamin C with every meal (3 times a day) the day prior to your procedure. Marland Kitchen Antibiotics: Inform the staff if you have a condition or reason that requires you to take antibiotics before dental procedures. . Pregnancy: If you are pregnant, call and cancel the procedure. . Sickness: If you have a cold, fever, or any active infections, call and cancel the procedure. . Arrival: You must be in the facility at least 30 minutes prior to your scheduled procedure. . Children: Do not bring children with you. . Dress appropriately: Bring dark clothing that you would not mind if they get stained. . Valuables: Do not bring any jewelry or valuables.  Reasons to call and reschedule or cancel your procedure: (Following these recommendations will minimize the risk of a serious complication.) . Surgeries: Avoid having procedures within 2 weeks of any surgery. (Avoid for 2 weeks before or after any surgery). . Flu Shots: Avoid having procedures within 2 weeks of a flu shots or . (Avoid for 2 weeks before or after immunizations). . Barium: Avoid having a procedure within 7-10 days after having had a radiological study involving the use of radiological contrast. (Myelograms, Barium swallow or enema study). . Heart attacks: Avoid any elective procedures or surgeries for the initial 6 months after a "Myocardial Infarction" (Heart Attack). . Blood thinners: It is imperative that you stop these medications before procedures. Let us know if you if you take any blood thinner.  . Infection: Avoid procedures during or within two weeks of an infection (including chest colds or gastrointestinal problems). Symptoms associated with infections include: Localized redness, fever, chills, night sweats or profuse sweating, burning sensation when voiding, cough, congestion, stuffiness,  runny nose, sore throat, diarrhea, nausea, vomiting, cold or Flu symptoms, recent or current infections. It is specially important if the infection is over the area that we intend to treat. Marland Kitchen Heart and lung problems: Symptoms that may suggest an active cardiopulmonary problem include: cough, chest pain, breathing difficulties or shortness of breath, dizziness, ankle swelling, uncontrolled high or unusually low blood pressure, and/or palpitations. If you are experiencing any of these symptoms, cancel your procedure and contact your primary care physician for an evaluation.  Remember:  Regular Business hours are:  Monday to Thursday 8:00 AM to 4:00 PM  Provider's Schedule: Milinda Pointer, MD:  Procedure days: Tuesday and Thursday 7:30 AM to 4:00 PM  Gillis Santa, MD:  Procedure days: Monday and Wednesday 7:30 AM to 4:00 PM ____________________________________________________________________________________________   ____________________________________________________________________________________________  Medication Rules  Purpose: To inform patients, and their family members, of our rules and regulations.  Applies to: All patients receiving prescriptions (written or electronic).  Pharmacy of record: Pharmacy where electronic prescriptions will be sent. If written prescriptions are taken to a different pharmacy, please inform the nursing staff.  The pharmacy listed in the electronic medical record should be the one where you would like electronic prescriptions to be sent.  Electronic prescriptions: In compliance with the [place] (STOP) Act of 2017 (Session Lanny Cramp [phone]), effective March 18, 2018, all controlled substances must be electronically prescribed. Calling prescriptions to the pharmacy will cease to exist.  Prescription refills: Only during scheduled appointments. Applies to all prescriptions.  NOTE: The following applies  primarily to controlled substances (Opioid* Pain Medications).   Patient's responsibilities: 1. Pain Pills: Bring all pain pills to every appointment (except for procedure appointments). 2. Pill Bottles: Bring pills in original pharmacy bottle. Always bring the newest bottle. Bring bottle, even if empty. 3. Medication refills: You are responsible for knowing and keeping track of what medications you take and those you need refilled. The day before your appointment: write a list of all prescriptions that need to be refilled. The day of the appointment: give the list to the admitting nurse. Prescriptions will be written only during appointments. No prescriptions will be written on procedure days. If you forget a medication: it will not be "Called in", "Faxed", or "electronically sent". You will need to get another appointment to get these prescribed. No early refills. Do not call asking to have your prescription filled early. 4. Prescription Accuracy: You are responsible for carefully inspecting your prescriptions before leaving our office. Have the discharge nurse carefully go over each prescription with you, before taking them home. Make sure that your name is accurately spelled, that your address is correct. Check the name and dose of your medication to make sure it is accurate. Check the number of pills, and the written instructions to make sure they are clear and accurate. Make sure that you are given enough medication to last until your next medication refill appointment. 5. Taking Medication: Take medication as prescribed. When it comes to controlled substances, taking less pills or less frequently than prescribed is permitted and encouraged. Never take more pills than instructed. Never take medication more frequently than prescribed.  6. Inform other Doctors: Always inform, all of your healthcare providers, of all the medications you take. 7. Pain Medication from other Providers: You are not  allowed to accept any additional pain medication from any other Doctor or Healthcare provider. There are two exceptions to this rule. (see below) In the event that you require additional pain medication, you are responsible for notifying us, as stated below. 8. Medication Agreement: You are responsible for carefully reading and following our Medication Agreement. This must be signed before receiving any prescriptions from our practice. Safely store a copy of your signed Agreement. Violations to the Agreement will result in no further prescriptions. (Additional copies of our Medication Agreement are available upon request.) 9. Laws, Rules, & Regulations: All patients are expected to follow all Federal and [employer], [employer], Rules, [employer]. Ignorance of the Laws does not constitute a valid excuse. The use of any illegal substances is prohibited. 10. Adopted CDC guidelines & recommendations: Target dosing levels will be at or below 60 MME/day. Use of benzodiazepines** is not recommended.  Exceptions: There are only two exceptions to the rule of not receiving pain medications from other Healthcare Providers. 1. Exception #1 (Emergencies): In the event of an emergency (i.e.: accident requiring emergency care), you are allowed to receive additional pain medication. However, you are responsible for: As soon as you are able, call our office [phone], at any time of the day or  night, and leave a message stating your name, the date and nature of the emergency, and the name and dose of the medication prescribed. In the event that your call is answered by a member of our staff, make sure to document and save the date, time, and the name of the person that took your information.  2. Exception #2 (Planned Surgery): In the event that you are scheduled by another doctor or dentist to have any type of surgery or procedure, you are allowed (for a period no longer than 30 days), to receive additional pain  medication, for the acute post-op pain. However, in this case, you are responsible for picking up a copy of our "Post-op Pain Management for Surgeons" handout, and giving it to your surgeon or dentist. This document is available at our office, and does not require an appointment to obtain it. Simply go to our office during business hours (Monday-Thursday from 8:00 AM to 4:00 PM) (Friday 8:00 AM to 12:00 Noon) or if you have a scheduled appointment with [place], prior to your surgery, and ask for it by name. In addition, you will need to provide [place] with your name, name of your surgeon, type of surgery, and date of procedure or surgery.  *Opioid medications include: morphine, codeine, oxycodone, oxymorphone, hydrocodone, hydromorphone, meperidine, tramadol, tapentadol, buprenorphine, fentanyl, methadone. **Benzodiazepine medications include: diazepam (Valium), alprazolam (Xanax), clonazepam (Klonopine), lorazepam (Ativan), clorazepate (Tranxene), chlordiazepoxide (Librium), estazolam (Prosom), oxazepam (Serax), temazepam (Restoril), triazolam (Halcion) (Last updated: 05/15/2017) ____________________________________________________________________________________________   ____________________________________________________________________________________________  Medication Recommendations and Reminders  Applies to: All patients receiving prescriptions (written and/or electronic).  Medication Rules & Regulations: These rules and regulations exist for your safety and that of others. They are not flexible and neither are we. Dismissing or ignoring them will be considered "non-compliance" with medication therapy, resulting in complete and irreversible termination of such therapy. (See document titled "Medication Rules" for more details.) In all conscience, because of safety reasons, we cannot continue providing a therapy where the patient does not follow instructions.  Pharmacy of record:   Definition: This  is the pharmacy where your electronic prescriptions will be sent.   We do not endorse any particular pharmacy.  You are not restricted in your choice of pharmacy.  The pharmacy listed in the electronic medical record should be the one where you want electronic prescriptions to be sent.  If you choose to change pharmacy, simply notify our nursing staff of your choice of new pharmacy.  Recommendations:  Keep all of your pain medications in a safe place, under lock and key, even if you live alone.   After you fill your prescription, take 1 week's worth of pills and put them away in a safe place. You should keep a separate, properly labeled bottle for this purpose. The remainder should be kept in the original bottle. Use this as your primary supply, until it runs out. Once it's gone, then you know that you have 1 week's worth of medicine, and it is time to come in for a prescription refill. If you do this correctly, it is unlikely that you will ever run out of medicine.  To make sure that the above recommendation works, it is very important that you make sure your medication refill appointments are scheduled at least 1 week before you run out of medicine. To do this in an effective manner, make sure that you do not leave the office without scheduling your next medication management appointment. Always ask the nursing staff to  show you in your prescription , when your medication will be running out. Then arrange for the receptionist to get you a return appointment, at least 7 days before you run out of medicine. Do not wait until you have 1 or 2 pills left, to come in. This is very poor planning and does not take into consideration that we may need to cancel appointments due to bad weather, sickness, or emergencies affecting our staff.  "Partial Fill": If for any reason your pharmacy does not have enough pills/tablets to completely fill or refill your prescription, do not allow for a "partial fill". You  will need a separate prescription to fill the remaining amount, which we will not provide. If the reason for the partial fill is your insurance, you will need to talk to the pharmacist about payment alternatives for the remaining tablets, but again, do not accept a partial fill.  Prescription refills and/or changes in medication(s):   Prescription refills, and/or changes in dose or medication, will be conducted only during scheduled medication management appointments. (Applies to both, written and electronic prescriptions.)  No refills on procedure days. No medication will be changed or started on procedure days. No changes, adjustments, and/or refills will be conducted on a procedure day. Doing so will interfere with the diagnostic portion of the procedure.  No phone refills. No medications will be "called into the pharmacy".  No Fax refills.  No weekend refills.  No Holliday refills.  No after hours refills.  Remember:  Business hours are:  Monday to Thursday 8:00 AM to 4:00 PM Provider's Schedule: Dionisio David, NP - Appointments are:  Medication management: Monday to Thursday 8:00 AM to 4:00 PM Milinda Pointer, MD - Appointments are:  Medication management: Monday and Wednesday 8:00 AM to 4:00 PM Procedure day: Tuesday and Thursday 7:30 AM to 4:00 PM Gillis Santa, MD - Appointments are:  Medication management: Tuesday and Thursday 8:00 AM to 4:00 PM Procedure day: Monday and Wednesday 7:30 AM to 4:00 PM (Last update: 05/15/2017) ____________________________________________________________________________________________   ____________________________________________________________________________________________  CANNABIDIOL (AKA: CBD Oil or Pills)  Applies to: All patients receiving prescriptions of controlled substances (written and/or electronic).  General Information: Cannabidiol (CBD) was discovered in 78. It is one of some 113 identified cannabinoids in cannabis  (Marijuana) plants, accounting for up to 40% of the plant's extract. As of 2018, preliminary clinical research on cannabidiol included studies of anxiety, cognition, movement disorders, and pain.  Cannabidiol is consummed in multiple ways, including inhalation of cannabis smoke or vapor, as an aerosol spray into the cheek, and by mouth. It may be supplied as CBD oil containing CBD as the active ingredient (no added tetrahydrocannabinol (THC) or terpenes), a full-plant CBD-dominant hemp extract oil, capsules, dried cannabis, or as a liquid solution. CBD is thought not have the same psychoactivity as THC, and may affect the actions of THC. Studies suggest that CBD may interact with different biological targets, including cannabinoid receptors and other neurotransmitter receptors. As of 2018 the mechanism of action for its biological effects has not been determined.  In the [place], cannabidiol has a limited approval by the Food and Drug Administration (FDA) for treatment of only two types of epilepsy disorders. The side effects of long-term use of the drug include somnolence, decreased appetite, diarrhea, fatigue, malaise, weakness, sleeping problems, and others.  CBD remains a Schedule I drug prohibited for any use.  Legality: Some manufacturers ship CBD products nationally, an illegal action which the FDA has not enforced in  2018, with CBD remaining the subject of an FDA investigational new drug evaluation, and is not considered legal as a dietary supplement or food ingredient as of December 2018. Federal illegality has made it difficult historically to conduct research on CBD. CBD is openly sold in head shops and health food stores in some states where such sales have not been explicitly legalized.  Warning: Because it is not FDA approved for general use or treatment of pain, it is not required to undergo the same manufacturing controls as prescription drugs.  This means that the available  cannabidiol (CBD) may be contaminated with THC.  If this is the case, it will trigger a positive urine drug screen (UDS) test for cannabinoids (Marijuana).  Because a positive UDS for illicit substances is a violation of our medication agreement, your opioid analgesics (pain medicine) may be permanently discontinued. (Last update: 06/05/2017) ____________________________________________________________________________________________   ____________________________________________________________________________________________  Muscle Spasms & Cramps  Cause:  The most common cause of muscle spasms and cramps is vitamin and/or electrolyte (calcium, potassium, sodium, etc.) deficiencies.  Possible triggers: Sweating - causes loss of electrolytes thru the skin. Steroids - causes loss of electrolytes thru the urine.  Treatment: 1. Gatorade (or any other electrolyte-replenishing drink) - Take 1, 8 oz glass with each meal (3 times a day). 2. OTC (over-the-counter) Magnesium 400 to 500 mg - Take 1 tablet twice a day (one with breakfast and one before bedtime). If you have kidney problems, talk to your primary care physician before taking any Magnesium. 3. Tonic Water with quinine - Take 1, 8 oz glass before bedtime.   ____________________________________________________________________________________________

## 2018-06-29 NOTE — Telephone Encounter (Signed)
Pt scheduled to see Tommi Rumps on 06/30/2018.  Nothing further needed.

## 2018-06-30 ENCOUNTER — Ambulatory Visit (INDEPENDENT_AMBULATORY_CARE_PROVIDER_SITE_OTHER): Payer: 59 | Admitting: Adult Health

## 2018-06-30 ENCOUNTER — Encounter: Payer: Self-pay | Admitting: Adult Health

## 2018-06-30 ENCOUNTER — Other Ambulatory Visit: Payer: Self-pay

## 2018-06-30 DIAGNOSIS — F419 Anxiety disorder, unspecified: Secondary | ICD-10-CM

## 2018-06-30 MED ORDER — SERTRALINE HCL 100 MG PO TABS: 200.0000 mg | ORAL_TABLET | Freq: Every day | ORAL | 1 refills | Status: DC

## 2018-06-30 MED ORDER — CLONAZEPAM 0.5 MG PO TABS: 0.5000 mg | ORAL_TABLET | Freq: Two times a day (BID) | ORAL | 0 refills | Status: DC | PRN

## 2018-06-30 NOTE — Progress Notes (Addendum)
Virtual Visit via Video Note  I connected with Chad Wilson on 06/30/18 at  3:30 PM EDT by a video enabled telemedicine application and verified that I am speaking with the correct person using two identifiers.  Location patient: home Location provider:work or home office Persons participating in the virtual visit: patient, provider  I discussed the limitations of evaluation and management by telemedicine and the availability of in person appointments. The patient expressed understanding and agreed to proceed.   HPI: Is a 43 year old male who is being evaluated today for follow-up regarding anxiety.  Currently prescribed Zoloft 100 mg as well as Ativan 0.5 mg every 8 H PRN.  He reports that he does not feel as though the medications are working well for him.  He continues to have episodes of's pretty severe anxiety panic attacks.  When I last saw him for this in October Celexa was changed to Prozac as he felt that Celexa was not controlling his anxiety well, on that visit he reported that Prozac was not working either.  Was then seen by another provider who gave him a limited supply of Ativan in January.  He reports that he has taken this intermittently and has not noticed any improvement in his anxiety.  Is wondering if there is anything else that we can trial   ROS: See pertinent positives and negatives per HPI.  Past Medical History:  Diagnosis Date  . Acute hepatitis C without mention of hepatic coma(070.51)    has been cleared   . Alcohol abuse, unspecified   . Anxiety and depression   . Anxiety states   . Essential hypertension, benign   . Graves disease    treated by Dr Loanne Drilling  . Panic disorder without agoraphobia   . Thyroid disease    Graves Disease  . Tobacco use disorder     Past Surgical History:  Procedure Laterality Date  . ANTERIOR CERVICAL DECOMP/DISCECTOMY FUSION N/A 05/21/2017   Procedure: Anterior Cervical Decompression Fusion Cervical five-six, Cervical  six-seven;  Surgeon: Eustace Moore, MD;  Location: [facility];  Service: Neurosurgery;  Laterality: N/A;  . MULTIPLE TOOTH EXTRACTIONS  15 yrs. ago    Family History  Problem Relation Age of Onset  . Anxiety disorder Father   . Heart disease Father   . Hypertension Father   . Thyroid disease Mother   . Anxiety disorder Sister   . Hyperthyroidism Sister        Current Outpatient Medications:  .  atorvastatin (LIPITOR) 20 MG tablet, TAKE 1 TABLET (20 MG TOTAL) BY MOUTH DAILY., Disp: 90 tablet, Rfl: 1 .  calcium carbonate (CALCIUM 600) 600 MG TABS tablet, Take 1 tablet (600 mg total) by mouth 2 (two) times daily with a meal for 30 days., Disp: 60 tablet, Rfl: 0 .  Cholecalciferol (VITAMIN D3) 125 MCG (5000 UT) CAPS, Take 1 capsule (5,000 Units total) by mouth daily with breakfast. Take along with calcium and magnesium., Disp: 30 capsule, Rfl: 5 .  clonazePAM (KLONOPIN) 0.5 MG tablet, Take 1 tablet (0.5 mg total) by mouth 2 (two) times daily as needed for anxiety., Disp: 30 tablet, Rfl: 0 .  Cyanocobalamin (VITAMIN B-12) 5000 MCG SUBL, Place 1 tablet (5,000 mcg total) under the tongue daily for 30 days., Disp: 30 each, Rfl: 0 .  DEPO-TESTOSTERONE 200 MG/ML injection, Take 200 mg by mouth every 14 (fourteen) days., Disp: , Rfl:  .  ergocalciferol (VITAMIN D2) 1.25 MG (50000 UT) capsule, Take 1 capsule (50,000 Units total)  by mouth 2 (two) times a week. X 6 weeks., Disp: 12 capsule, Rfl: 0 .  HYDROcodone-acetaminophen (NORCO) 10-325 MG tablet, Take 1 tablet by mouth every 6 (six) hours., Disp: , Rfl:  .  levothyroxine (SYNTHROID, LEVOTHROID) 150 MCG tablet, Take 1 tablet (150 mcg total) by mouth daily., Disp: 90 tablet, Rfl: 3 .  lisinopril (PRINIVIL,ZESTRIL) 40 MG tablet, TAKE 1 TABLET BY MOUTH ONCE DAILY, Disp: 90 tablet, Rfl: 1 .  Magnesium 500 MG CAPS, Take 1 capsule (500 mg total) by mouth 2 (two) times daily at 8 am and 10 pm., Disp: 60 capsule, Rfl: 5 .  oxyCODONE (OXY IR/ROXICODONE) 5 MG  immediate release tablet, Take 1 tablet (5 mg total) by mouth every 6 (six) hours as needed for up to 30 days for severe pain. Must last 30 days., Disp: 120 tablet, Rfl: 0 .  sertraline (ZOLOFT) 100 MG tablet, Take 2 tablets (200 mg total) by mouth daily., Disp: 180 tablet, Rfl: 1 .  sildenafil (VIAGRA) 100 MG tablet, Take 1 tablet (100 mg total) by mouth daily as needed for erectile dysfunction., Disp: 10 tablet, Rfl: 11  EXAM:  VITALS per patient if applicable:  GENERAL: alert, oriented, appears well and in no acute distress  HEENT: atraumatic, conjunttiva clear, no obvious abnormalities on inspection of external nose and ears  NECK: normal movements of the head and neck  LUNGS: on inspection no signs of respiratory distress, breathing rate appears normal, no obvious gross SOB, gasping or wheezing  CV: no obvious cyanosis  MS: moves all visible extremities without noticeable abnormality  PSYCH/NEURO: pleasant and cooperative, no obvious depression or anxiety, speech and thought processing grossly intact  ASSESSMENT AND PLAN:  Discussed the following assessment and plan:  Anxiety - Plan: sertraline (ZOLOFT) 100 MG tablet, clonazePAM (KLONOPIN) 0.5 MG tablet, Ambulatory referral to Psychiatry  Discussed treatment and plan with the patient, we will increase Zoloft from 100 daily.  Discontinue Ativan and trial Klonopin.  Will refer him to behavioral health for further management of his anxiety.  He was advised not to take Klonopin with his narcotic pain medication.  Follow-up in 2 to 3 weeks to see how he is doing   I discussed the assessment and treatment plan with the patient. The patient was provided an opportunity to ask questions and all were answered. The patient agreed with the plan and demonstrated an understanding of the instructions.   The patient was advised to call back or seek an in-person evaluation if the symptoms worsen or if the condition fails to improve as  anticipated.   Dorothyann Peng, NP

## 2018-07-03 ENCOUNTER — Encounter: Payer: Self-pay | Admitting: Adult Health

## 2018-07-03 MED ORDER — ALBUTEROL SULFATE HFA 108 (90 BASE) MCG/ACT IN AERS: 2.0000 | INHALATION_SPRAY | Freq: Four times a day (QID) | RESPIRATORY_TRACT | 1 refills | Status: DC | PRN

## 2018-07-03 MED FILL — SERTRALINE HCL 100 MG TAB: 100 | 90 days supply | Qty: 180 | Fill #0

## 2018-07-03 MED FILL — ALBUTEROL SULFATE HFA 108 (: 108 (90 BAS | 25 days supply | Qty: 9 | Fill #0

## 2018-07-14 ENCOUNTER — Ambulatory Visit: Payer: 59 | Admitting: Adult Health

## 2018-07-16 ENCOUNTER — Other Ambulatory Visit: Payer: Self-pay | Admitting: Endocrinology

## 2018-07-16 DIAGNOSIS — N5201 Erectile dysfunction due to arterial insufficiency: Secondary | ICD-10-CM | POA: Diagnosis not present

## 2018-07-16 DIAGNOSIS — E349 Endocrine disorder, unspecified: Secondary | ICD-10-CM | POA: Diagnosis not present

## 2018-07-16 MED FILL — LISINOPRIL 40 MG TABLET: 40 | 90 days supply | Qty: 90 | Fill #0

## 2018-07-16 MED FILL — ATORVASTATIN 20 MG TABLET: 20 | 90 days supply | Qty: 90 | Fill #0

## 2018-07-16 MED FILL — SILDENAFIL CITRATE 20 MG TA: 20 | 18 days supply | Qty: 90 | Fill #0

## 2018-07-16 MED FILL — LEVOTHYROXINE 150 MCG TAB: 150 | 90 days supply | Qty: 90 | Fill #0

## 2018-07-20 ENCOUNTER — Encounter: Payer: Self-pay | Admitting: Pain Medicine

## 2018-07-20 NOTE — Progress Notes (Signed)
Pain Management Virtual Encounter Note - Virtual Visit via Telephone Telehealth (real-time audio visits between healthcare provider and patient).  Patient's Phone No. & Preferred Pharmacy:  [phone] (home); [phone] (mobile); (Preferred) [phone] [email or web address]  [facility], [place] [address] [place] [address] Phone: [phone] Fax: [phone]   Pre-screening note:  Our staff contacted Chad Wilson and offered him an "in person", "face-to-face" appointment versus a telephone encounter. He indicated preferring the telephone encounter, at this time.  Reason for Virtual Visit: COVID-19*  Social distancing based on CDC and AMA recommendations.   I contacted Chad Wilson on 07/22/2018 at 11:17 AM via telephone and clearly identified myself as [name], MD. I verified that I was speaking with the correct person using two identifiers (Name and date of birth: August 19, 1975).  Advanced Informed Consent I sought verbal advanced consent from Chad Wilson for virtual visit interactions. I informed Chad Wilson of possible security and privacy concerns, risks, and limitations associated with providing "not-in-person" medical evaluation and management services. I also informed Chad Wilson of the availability of "in-person" appointments. Finally, I informed him that there would be a charge for the virtual visit and that he could be  personally, fully or partially, financially responsible for it. Chad Wilson expressed understanding and agreed to proceed.   Historic Elements   Chad Wilson is a 43 y.o. year old, male patient evaluated today after his last encounter by our practice on 06/29/2018. Chad Wilson  has a past medical history of Acute hepatitis C without mention of hepatic coma(070.51), Alcohol abuse, unspecified, Anxiety and depression, Anxiety states, Essential hypertension, benign, Graves  disease, Panic disorder without agoraphobia, Thyroid disease, and Tobacco use disorder. He also  has a past surgical history that includes Multiple tooth extractions (15 yrs. ago) and Anterior cervical decomp/discectomy fusion (N/A, 05/21/2017). Chad Wilson has a current medication list which includes the following prescription(s): albuterol, atorvastatin, vitamin d3, vitamin b-12, depo-testosterone, ergocalciferol, levothyroxine, lisinopril, oxycodone, sertraline, sildenafil, clonazepam, and omeprazole. He  reports that he has been smoking. He has a 30.00 pack-year smoking history. His smokeless tobacco use includes chew. He reports previous alcohol use. He reports that he does not use drugs. Chad Wilson is allergic to chantix [varenicline].   HPI  I last communicated with him on 06/29/2018. Today, he is being contacted for medication management.  On the patient's last visit we had switched him from the hydrocodone to the oxycodone.  He indicates that even though he takes the oxycodone with some food, he has been experiencing a little bit of nausea.  At this point we have decided to continue with the medication since this is a side effect and he may develop some tolerance to it.  Indicates not experiencing any type of problems with constipation or any other adverse reactions to the medication.  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  The plan is to proceed with diagnostic bilateral lumbar facet blocks under fluoroscopic guidance and IV sedation as soon as the COVID-19 restrictions are lifted.  He was informed of the plan and he agrees with it.  Pharmacotherapy Assessment  Analgesic: Oxycodone IR 5 mg every 6 hours (20 mg/day of oxycodone) MME/day: 30 mg/day.   Monitoring: Pharmacotherapy: No side-effects or adverse reactions reported. [place] PMP: PDMP reviewed during this encounter.          Compliance: No problems identified or detected. Plan: Refer to "POC".  Review of recent tests  MR LUMBAR SPINE  WO CONTRAST CLINICAL DATA:  43 year old male with 2 months of low back pain. No known injury.  EXAM: MRI LUMBAR SPINE WITHOUT CONTRAST  TECHNIQUE: Multiplanar, multisequence MR imaging of the lumbar spine was performed. No intravenous contrast was administered.  COMPARISON:  Lumbar MRI 02/02/2017.  Lumbar radiographs 12/31/2016.  FINDINGS: Segmentation: Normal on the comparison radiographs which is the same numbering system used on the 2018 MRI.  Alignment: Stable lumbar lordosis. Subtle retrolisthesis of L5 on S1.  Vertebrae: Subtle degenerative endplate marrow edema anteriorly at L3-L4, associated with chronic degenerative endplate spurring. Normal background bone marrow signal. No other marrow edema or acute osseous abnormality. Intact visible sacrum and SI joints.  Conus medullaris and cauda equina: Conus extends to the L1 level. No lower spinal cord or conus signal abnormality.  Paraspinal and other soft tissues: Negative.  Disc levels:  T11-T12: Mild disc desiccation.  T12-L1:  Negative.  L1-L2:  Negative.  L2-L3:  Minimal disc bulging and facet hypertrophy.  No stenosis.  L3-L4: Mild disc desiccation and circumferential disc bulge. Mild facet hypertrophy with trace degenerative facet joint fluid. No spinal or lateral recess stenosis. Mild bilateral L3 foraminal stenosis is stable.  L4-L5: Mild far lateral disc bulging and endplate spurring. Mild facet hypertrophy. Trace facet joint fluid and occasional posterior small synovial cysts. No spinal or lateral recess stenosis. Mild bilateral L4 foraminal stenosis is stable.  L5-S1:  Borderline to mild facet hypertrophy.  No stenosis.  IMPRESSION: Stable MRI appearance of the lumbar spine since 2018. Mild degenerative disc bulging. Multilevel mild facet hypertrophy.  No spinal or lateral recess stenosis. Mild bilateral L3 and L4 foraminal stenosis.  Electronically Signed   By: Genevie Ann M.D.   On: 04/24/2018  20:30   Office Visit on 06/03/2018  Component Date Value Ref Range Status  . Summary 06/03/2018 FINAL   Final   Comment: ==================================================================== TOXASSURE COMP DRUG ANALYSIS,UR ==================================================================== Test                             Result       Flag       Units Drug Present   Lorazepam                      219                     ng/mg creat    Source of lorazepam is a scheduled prescription medication.   Hydrocodone                    1871                    ng/mg creat   Hydromorphone                  463                     ng/mg creat   Dihydrocodeine                 252                     ng/mg creat   Norhydrocodone                 1617  ng/mg creat    Sources of hydrocodone include scheduled prescription    medications. Hydromorphone, dihydrocodeine and norhydrocodone are    expected metabolites of hydrocodone. Hydromorphone and    dihydrocodeine are also available as scheduled prescription    medications.   Zolpidem                       PRESENT   Zolpidem Acid                                            PRESENT    Zolpidem acid is an expected metabolite of zolpidem.   Sertraline                     PRESENT   Desmethylsertraline            PRESENT    Desmethylsertraline is an expected metabolite of sertraline.   Acetaminophen                  PRESENT ==================================================================== Test                      Result    Flag   Units      Ref Range   Creatinine              52               mg/dL      >=20 ==================================================================== Declared Medications:  Medication list was not provided. ==================================================================== For clinical consultation, please call [phone]. ====================================================================   .  Glucose 06/03/2018 101* 65 - 99 mg/dL Final  . BUN 06/03/2018 6  6 - 24 mg/dL Final  . Creatinine, Ser 06/03/2018 0.76  0.76 - 1.27 mg/dL Final  . GFR calc non Af Amer 06/03/2018 111  >59 mL/min/1.73 Final  . GFR calc Af Amer 06/03/2018 128  >59 mL/min/1.73 Final  . BUN/Creatinine Ratio 06/03/2018 8* 9 - 20 Final  . Sodium 06/03/2018 140  134 - 144 mmol/L Final  . Potassium 06/03/2018 3.9  3.5 - 5.2 mmol/L Final  . Chloride 06/03/2018 102  96 - 106 mmol/L Final  . Calcium 06/03/2018 9.6  8.7 - 10.2 mg/dL Final  . Total Protein 06/03/2018 6.8  6.0 - 8.5 g/dL Final  . Albumin 06/03/2018 4.6  4.0 - 5.0 g/dL Final  . Globulin, Total 06/03/2018 2.2  1.5 - 4.5 g/dL Final  . Albumin/Globulin Ratio 06/03/2018 2.1  1.2 - 2.2 Final  . Bilirubin Total 06/03/2018 0.3  0.0 - 1.2 mg/dL Final  . Alkaline Phosphatase 06/03/2018 69  39 - 117 IU/L Final  . AST 06/03/2018 23  0 - 40 IU/L Final  . Magnesium 06/03/2018 2.1  1.6 - 2.3 mg/dL Final  . Sed Rate 06/03/2018 21* 0 - 15 mm/hr Final  . CRP 06/03/2018 1  0 - 10 mg/L Final   Assessment  The primary encounter diagnosis was Chronic low back pain (Primary area of Pain) (Bilateral) (R=L) w/o sciatica. Diagnoses of Lumbar facet hypertrophy (Bilateral), Lumbar facet syndrome (Bilateral), and Chronic pain syndrome were also pertinent to this visit.  Plan of Care  I have discontinued Chad Wilson's HYDROcodone-acetaminophen, Magnesium, and calcium carbonate. I am also having him maintain his sildenafil, lisinopril, atorvastatin, Depo-Testosterone, Vitamin B-12, ergocalciferol, Vitamin D3, sertraline, albuterol, levothyroxine, and oxyCODONE.  Pharmacotherapy (  Medications Ordered): Meds ordered this encounter  Medications  . oxyCODONE (OXY IR/ROXICODONE) 5 MG immediate release tablet    Sig: Take 1 tablet (5 mg total) by mouth every 6 (six) hours as needed for up to 30 days for severe pain. Must last 30 days.    Dispense:  120 tablet    Refill:  0    [facility]  STOP ACT - Not applicable to Chronic Pain Syndrome (G89.4) diagnosis. Fill one day early if pharmacy is closed on scheduled refill date. Do not fill until: 07/29/18. To last until: 08/28/18.   Orders:  Orders Placed This Encounter  Procedures  . LUMBAR FACET(MEDIAL BRANCH NERVE BLOCK) MBNB    Standing Status:   Future    Standing Expiration Date:   08/22/2018    Scheduling Instructions:     Side: Bilateral     Level: L3-4, L4-5, & L5-S1 Facets (L2, L3, L4, L5, & S1 Medial Branch Nerves)     Sedation: Patient's choice.     Timeframe: ASAA    Order Specific Question:   Where will this procedure be performed?    Answer:   [facility]   Follow-up plan:   Return in about 1 month (around 08/22/2018) for Med-Mgmt, (Virtual Visit), in addition, Procedure, (w/ sedation): (B) L-FCT BLK #1.   Interventional management options: Planned, scheduled, and/or pending:    Diagnostic bilateral lumbar facet block #1 under fluoroscopic guidance and IV sedation.  This is to be scheduled after the COVID-19 restrictions are lifted.  Meanwhile, we will continue the patient on pain medication, which we will be switching from the hydrocodone/APAP 5/325 2 oxycodone IR 5 mg 4 times daily.  He was instructed to come by the office to pick up the prescription and also to sign her medication agreement.  He has indicated that he has access to "my chart" and therefore I have provided him with important information in the after visit summary.   Considering:   Diagnostic bilateral lumbar facet nerve block  Possible bilateral lumbar facet RFA    PRN Procedures:   None at this time   I discussed the assessment and treatment plan with the patient. The patient was provided an opportunity to ask questions and all were answered. The patient agreed with the plan and demonstrated an understanding of the instructions.  Patient advised to call back or seek an in-person evaluation if the symptoms or condition worsens.  Total  duration of non-face-to-face encounter: 12 minutes.  Note by: [name], MD Date: 07/22/2018; Time: 11:41 AM  Disclaimer:  * Given the special circumstances of the COVID-19 pandemic, the federal government has announced that the Office for Civil Rights (OCR) will exercise its enforcement discretion and will not impose penalties on physicians using telehealth in the event of noncompliance with regulatory requirements under the [place] and Accountability Act (HIPAA) in connection with the good faith provision of telehealth during the [id number]-18 national public health emergency. ([place])

## 2018-07-21 ENCOUNTER — Encounter: Payer: Self-pay | Admitting: Pain Medicine

## 2018-07-21 ENCOUNTER — Other Ambulatory Visit: Payer: Self-pay | Admitting: Adult Health

## 2018-07-21 DIAGNOSIS — F419 Anxiety disorder, unspecified: Secondary | ICD-10-CM

## 2018-07-22 ENCOUNTER — Other Ambulatory Visit: Payer: Self-pay

## 2018-07-22 ENCOUNTER — Ambulatory Visit: Payer: 59 | Attending: Pain Medicine | Admitting: Pain Medicine

## 2018-07-22 DIAGNOSIS — M545 Low back pain, unspecified: Secondary | ICD-10-CM

## 2018-07-22 DIAGNOSIS — G8929 Other chronic pain: Secondary | ICD-10-CM | POA: Diagnosis not present

## 2018-07-22 DIAGNOSIS — M47816 Spondylosis without myelopathy or radiculopathy, lumbar region: Secondary | ICD-10-CM

## 2018-07-22 DIAGNOSIS — G894 Chronic pain syndrome: Secondary | ICD-10-CM | POA: Diagnosis not present

## 2018-07-22 MED ORDER — OXYCODONE HCL 5 MG PO TABS: 5.0000 mg | ORAL_TABLET | Freq: Four times a day (QID) | ORAL | 0 refills | Status: DC | PRN

## 2018-07-22 MED ORDER — CLONAZEPAM 0.5 MG PO TABS: 0.5000 mg | ORAL_TABLET | Freq: Two times a day (BID) | ORAL | 0 refills | Status: DC | PRN

## 2018-07-22 NOTE — Patient Instructions (Addendum)
____________________________________________________________________________________________  Pain Prevention Technique  Definition:   A technique used to minimize the effects of an activity known to cause inflammation or swelling, which in turn leads to an increase in pain.  Purpose: To prevent swelling from occurring. It is based on the fact that it is easier to prevent swelling from happening than it is to get rid of it, once it occurs.  Contraindications: 1. Anyone with allergy or hypersensitivity to the recommended medications. 2. Anyone taking anticoagulants (Blood Thinners) (e.g., Coumadin, Warfarin, Plavix, etc.). 3. Patients in Renal Failure.  Technique: Before you undertake an activity known to cause pain, or a flare-up of your chronic pain, and before you experience any pain, do the following:  1. On a full stomach, take 4 (four) over the counter Ibuprofens 210m tablets (Motrin), for a total of 800 mg. 2. In addition, take over the counter Magnesium 400 to 500 mg, before doing the activity.  3. Six (6) hours later, again on a full stomach, repeat the Ibuprofen. 4. That night, take a warm shower and stretch under the running warm water.  This technique may be sufficient to abort the pain and discomfort before it happens. Keep in mind that it takes a lot less medication to prevent swelling than it takes to eliminate it once it occurs.  ____________________________________________________________________________________________   ____________________________________________________________________________________________  Preparing for Procedure with Sedation  Procedure appointments are limited to planned procedures: . No Prescription Refills. . No disability issues will be discussed. . No medication changes will be discussed.  Instructions: . Oral Intake: Do not eat or drink anything for at least 8 hours prior to your procedure. . Transportation: Public transportation  is not allowed. Bring an adult driver. The driver must be physically present in our waiting room before any procedure can be started. .Marland KitchenPhysical Assistance: Bring an adult physically capable of assisting you, in the event you need help. This adult should keep you company at home for at least 6 hours after the procedure. . Blood Pressure Medicine: Take your blood pressure medicine with a sip of water the morning of the procedure. . Blood thinners: Notify our staff if you are taking any blood thinners. Depending on which one you take, there will be specific instructions on how and when to stop it. . Diabetics on insulin: Notify the staff so that you can be scheduled 1st case in the morning. If your diabetes requires high dose insulin, take only  of your normal insulin dose the morning of the procedure and notify the staff that you have done so. . Preventing infections: Shower with an antibacterial soap the morning of your procedure. . Build-up your immune system: Take 1000 mg of Vitamin C with every meal (3 times a day) the day prior to your procedure. .Marland KitchenAntibiotics: Inform the staff if you have a condition or reason that requires you to take antibiotics before dental procedures. . Pregnancy: If you are pregnant, call and cancel the procedure. . Sickness: If you have a cold, fever, or any active infections, call and cancel the procedure. . Arrival: You must be in the facility at least 30 minutes prior to your scheduled procedure. . Children: Do not bring children with you. . Dress appropriately: Bring dark clothing that you would not mind if they get stained. . Valuables: Do not bring any jewelry or valuables.  Reasons to call and reschedule or cancel your procedure: (Following these recommendations will minimize the risk of a serious complication.) . Surgeries: Avoid having  procedures within 2 weeks of any surgery. (Avoid for 2 weeks before or after any surgery). . Flu Shots: Avoid having procedures  within 2 weeks of a flu shots or . (Avoid for 2 weeks before or after immunizations). . Barium: Avoid having a procedure within 7-10 days after having had a radiological study involving the use of radiological contrast. (Myelograms, Barium swallow or enema study). . Heart attacks: Avoid any elective procedures or surgeries for the initial 6 months after a "Myocardial Infarction" (Heart Attack). . Blood thinners: It is imperative that you stop these medications before procedures. Let us know if you if you take any blood thinner.  . Infection: Avoid procedures during or within two weeks of an infection (including chest colds or gastrointestinal problems). Symptoms associated with infections include: Localized redness, fever, chills, night sweats or profuse sweating, burning sensation when voiding, cough, congestion, stuffiness, runny nose, sore throat, diarrhea, nausea, vomiting, cold or Flu symptoms, recent or current infections. It is specially important if the infection is over the area that we intend to treat. Marland Kitchen Heart and lung problems: Symptoms that may suggest an active cardiopulmonary problem include: cough, chest pain, breathing difficulties or shortness of breath, dizziness, ankle swelling, uncontrolled high or unusually low blood pressure, and/or palpitations. If you are experiencing any of these symptoms, cancel your procedure and contact your primary care physician for an evaluation.  Remember:  Regular Business hours are:  Monday to Thursday 8:00 AM to 4:00 PM  Provider's Schedule: Milinda Pointer, MD:  Procedure days: Tuesday and Thursday 7:30 AM to 4:00 PM  Gillis Santa, MD:  Procedure days: Monday and Wednesday 7:30 AM to 4:00 PM ____________________________________________________________________________________________

## 2018-07-23 ENCOUNTER — Encounter: Payer: Self-pay | Admitting: *Deleted

## 2018-07-23 ENCOUNTER — Other Ambulatory Visit: Payer: Self-pay | Admitting: Adult Health

## 2018-07-23 ENCOUNTER — Encounter: Payer: Self-pay | Admitting: Adult Health

## 2018-07-23 ENCOUNTER — Encounter: Payer: Self-pay | Admitting: Pain Medicine

## 2018-07-23 DIAGNOSIS — F419 Anxiety disorder, unspecified: Secondary | ICD-10-CM

## 2018-07-23 MED ORDER — CLONAZEPAM 0.5 MG PO TABS: 0.5000 mg | ORAL_TABLET | Freq: Two times a day (BID) | ORAL | 0 refills | Status: DC | PRN

## 2018-07-23 MED FILL — ALBUTEROL SULFATE HFA 108 (: 108 (90 BAS | 25 days supply | Qty: 9 | Fill #0

## 2018-07-23 MED FILL — clonazePAM 0.5 MG TABS: 0.5 | 15 days supply | Qty: 30 | Fill #0

## 2018-07-29 MED FILL — oxyCODONE HCL 5 MG TABS: 5 | 30 days supply | Qty: 120 | Fill #0

## 2018-08-05 MED FILL — TESTOSTERONE CYPIONATE 200: 200 | 84 days supply | Qty: 6 | Fill #0

## 2018-08-13 ENCOUNTER — Encounter: Payer: Self-pay | Admitting: Pain Medicine

## 2018-08-17 ENCOUNTER — Other Ambulatory Visit: Payer: Self-pay

## 2018-08-17 ENCOUNTER — Ambulatory Visit: Payer: 59 | Attending: Pain Medicine | Admitting: Pain Medicine

## 2018-08-17 ENCOUNTER — Telehealth: Payer: Self-pay | Admitting: *Deleted

## 2018-08-17 DIAGNOSIS — M5030 Other cervical disc degeneration, unspecified cervical region: Secondary | ICD-10-CM

## 2018-08-17 DIAGNOSIS — M5137 Other intervertebral disc degeneration, lumbosacral region: Secondary | ICD-10-CM | POA: Diagnosis not present

## 2018-08-17 DIAGNOSIS — M545 Low back pain, unspecified: Secondary | ICD-10-CM

## 2018-08-17 DIAGNOSIS — Z9889 Other specified postprocedural states: Secondary | ICD-10-CM | POA: Diagnosis not present

## 2018-08-17 DIAGNOSIS — G8928 Other chronic postprocedural pain: Secondary | ICD-10-CM

## 2018-08-17 DIAGNOSIS — Z01818 Encounter for other preprocedural examination: Secondary | ICD-10-CM | POA: Insufficient documentation

## 2018-08-17 DIAGNOSIS — G894 Chronic pain syndrome: Secondary | ICD-10-CM | POA: Diagnosis not present

## 2018-08-17 DIAGNOSIS — Z01810 Encounter for preprocedural cardiovascular examination: Secondary | ICD-10-CM

## 2018-08-17 DIAGNOSIS — G8929 Other chronic pain: Secondary | ICD-10-CM | POA: Diagnosis not present

## 2018-08-17 DIAGNOSIS — M542 Cervicalgia: Secondary | ICD-10-CM

## 2018-08-17 MED ORDER — OXYCODONE HCL 5 MG PO TABS: 5.0000 mg | ORAL_TABLET | Freq: Four times a day (QID) | ORAL | 0 refills | Status: DC | PRN

## 2018-08-17 NOTE — Patient Instructions (Signed)
____________________________________________________________________________________________  Medication Rules  Purpose: To inform patients, and their family members, of our rules and regulations.  Applies to: All patients receiving prescriptions (written or electronic).  Pharmacy of record: Pharmacy where electronic prescriptions will be sent. If written prescriptions are taken to a different pharmacy, please inform the nursing staff. The pharmacy listed in the electronic medical record should be the one where you would like electronic prescriptions to be sent.  Electronic prescriptions: In compliance with the [place] Strengthen Opioid Misuse Prevention (STOP) Act of 2017 (Session Law 2017-74/H243), effective March 18, 2018, all controlled substances must be electronically prescribed. Calling prescriptions to the pharmacy will cease to exist.  Prescription refills: Only during scheduled appointments. Applies to all prescriptions.  NOTE: The following applies primarily to controlled substances (Opioid* Pain Medications).   Patient's responsibilities: 1. Pain Pills: Bring all pain pills to every appointment (except for procedure appointments). 2. Pill Bottles: Bring pills in original pharmacy bottle. Always bring the newest bottle. Bring bottle, even if empty. 3. Medication refills: You are responsible for knowing and keeping track of what medications you take and those you need refilled. The day before your appointment: write a list of all prescriptions that need to be refilled. The day of the appointment: give the list to the admitting nurse. Prescriptions will be written only during appointments. No prescriptions will be written on procedure days. If you forget a medication: it will not be "Called in", "Faxed", or "electronically sent". You will need to get another appointment to get these prescribed. No early refills. Do not call asking to have your prescription filled  early. 4. Prescription Accuracy: You are responsible for carefully inspecting your prescriptions before leaving our office. Have the discharge nurse carefully go over each prescription with you, before taking them home. Make sure that your name is accurately spelled, that your address is correct. Check the name and dose of your medication to make sure it is accurate. Check the number of pills, and the written instructions to make sure they are clear and accurate. Make sure that you are given enough medication to last until your next medication refill appointment. 5. Taking Medication: Take medication as prescribed. When it comes to controlled substances, taking less pills or less frequently than prescribed is permitted and encouraged. Never take more pills than instructed. Never take medication more frequently than prescribed.  6. Inform other Doctors: Always inform, all of your healthcare providers, of all the medications you take. 7. Pain Medication from other Providers: You are not allowed to accept any additional pain medication from any other Doctor or Healthcare provider. There are two exceptions to this rule. (see below) In the event that you require additional pain medication, you are responsible for notifying us, as stated below. 8. Medication Agreement: You are responsible for carefully reading and following our Medication Agreement. This must be signed before receiving any prescriptions from our practice. Safely store a copy of your signed Agreement. Violations to the Agreement will result in no further prescriptions. (Additional copies of our Medication Agreement are available upon request.) 9. Laws, Rules, & Regulations: All patients are expected to follow all Federal and State Laws, Statutes, Rules, & Regulations. Ignorance of the Laws does not constitute a valid excuse. The use of any illegal substances is prohibited. 10. Adopted CDC guidelines & recommendations: Target dosing levels will be  at or below 60 MME/day. Use of benzodiazepines** is not recommended.  Exceptions: There are only two exceptions to the rule of not   receiving pain medications from other Healthcare Providers. 1. Exception #1 (Emergencies): In the event of an emergency (i.e.: accident requiring emergency care), you are allowed to receive additional pain medication. However, you are responsible for: As soon as you are able, call our office [phone], at any time of the day or night, and leave a message stating your name, the date and nature of the emergency, and the name and dose of the medication prescribed. In the event that your call is answered by a member of our staff, make sure to document and save the date, time, and the name of the person that took your information.  2. Exception #2 (Planned Surgery): In the event that you are scheduled by another doctor or dentist to have any type of surgery or procedure, you are allowed (for a period no longer than 30 days), to receive additional pain medication, for the acute post-op pain. However, in this case, you are responsible for picking up a copy of our "Post-op Pain Management for Surgeons" handout, and giving it to your surgeon or dentist. This document is available at our office, and does not require an appointment to obtain it. Simply go to our office during business hours (Monday-Thursday from 8:00 AM to 4:00 PM) (Friday 8:00 AM to 12:00 Noon) or if you have a scheduled appointment with us, prior to your surgery, and ask for it by name. In addition, you will need to provide us with your name, name of your surgeon, type of surgery, and date of procedure or surgery.  *Opioid medications include: morphine, codeine, oxycodone, oxymorphone, hydrocodone, hydromorphone, meperidine, tramadol, tapentadol, buprenorphine, fentanyl, methadone. **Benzodiazepine medications include: diazepam (Valium), alprazolam (Xanax), clonazepam (Klonopine), lorazepam (Ativan), clorazepate  (Tranxene), chlordiazepoxide (Librium), estazolam (Prosom), oxazepam (Serax), temazepam (Restoril), triazolam (Halcion) (Last updated: 05/15/2017) ____________________________________________________________________________________________   ____________________________________________________________________________________________  Medication Recommendations and Reminders  Applies to: All patients receiving prescriptions (written and/or electronic).  Medication Rules & Regulations: These rules and regulations exist for your safety and that of others. They are not flexible and neither are we. Dismissing or ignoring them will be considered "non-compliance" with medication therapy, resulting in complete and irreversible termination of such therapy. (See document titled "Medication Rules" for more details.) In all conscience, because of safety reasons, we cannot continue providing a therapy where the patient does not follow instructions.  Pharmacy of record:   Definition: This is the pharmacy where your electronic prescriptions will be sent.   We do not endorse any particular pharmacy.  You are not restricted in your choice of pharmacy.  The pharmacy listed in the electronic medical record should be the one where you want electronic prescriptions to be sent.  If you choose to change pharmacy, simply notify our nursing staff of your choice of new pharmacy.  Recommendations:  Keep all of your pain medications in a safe place, under lock and key, even if you live alone.   After you fill your prescription, take 1 week's worth of pills and put them away in a safe place. You should keep a separate, properly labeled bottle for this purpose. The remainder should be kept in the original bottle. Use this as your primary supply, until it runs out. Once it's gone, then you know that you have 1 week's worth of medicine, and it is time to come in for a prescription refill. If you do this correctly, it  is unlikely that you will ever run out of medicine.  To make sure that the above recommendation works,   it is very important that you make sure your medication refill appointments are scheduled at least 1 week before you run out of medicine. To do this in an effective manner, make sure that you do not leave the office without scheduling your next medication management appointment. Always ask the nursing staff to show you in your prescription , when your medication will be running out. Then arrange for the receptionist to get you a return appointment, at least 7 days before you run out of medicine. Do not wait until you have 1 or 2 pills left, to come in. This is very poor planning and does not take into consideration that we may need to cancel appointments due to bad weather, sickness, or emergencies affecting our staff.  "Partial Fill": If for any reason your pharmacy does not have enough pills/tablets to completely fill or refill your prescription, do not allow for a "partial fill". You will need a separate prescription to fill the remaining amount, which we will not provide. If the reason for the partial fill is your insurance, you will need to talk to the pharmacist about payment alternatives for the remaining tablets, but again, do not accept a partial fill.  Prescription refills and/or changes in medication(s):   Prescription refills, and/or changes in dose or medication, will be conducted only during scheduled medication management appointments. (Applies to both, written and electronic prescriptions.)  No refills on procedure days. No medication will be changed or started on procedure days. No changes, adjustments, and/or refills will be conducted on a procedure day. Doing so will interfere with the diagnostic portion of the procedure.  No phone refills. No medications will be "called into the pharmacy".  No Fax refills.  No weekend refills.  No Holliday refills.  No after hours  refills.  Remember:  Business hours are:  Monday to Thursday 8:00 AM to 4:00 PM Provider's Schedule: Crystal King, NP - Appointments are:  Medication management: Monday to Thursday 8:00 AM to 4:00 PM Mirakle Tomlin, MD - Appointments are:  Medication management: Monday and Wednesday 8:00 AM to 4:00 PM Procedure day: Tuesday and Thursday 7:30 AM to 4:00 PM Bilal Lateef, MD - Appointments are:  Medication management: Tuesday and Thursday 8:00 AM to 4:00 PM Procedure day: Monday and Wednesday 7:30 AM to 4:00 PM (Last update: 05/15/2017) ____________________________________________________________________________________________   ____________________________________________________________________________________________  CANNABIDIOL (AKA: CBD Oil or Pills)  Applies to: All patients receiving prescriptions of controlled substances (written and/or electronic).  General Information: Cannabidiol (CBD) was discovered in 1940. It is one of some 113 identified cannabinoids in cannabis (Marijuana) plants, accounting for up to 40% of the plant's extract. As of 2018, preliminary clinical research on cannabidiol included studies of anxiety, cognition, movement disorders, and pain.  Cannabidiol is consummed in multiple ways, including inhalation of cannabis smoke or vapor, as an aerosol spray into the cheek, and by mouth. It may be supplied as CBD oil containing CBD as the active ingredient (no added tetrahydrocannabinol (THC) or terpenes), a full-plant CBD-dominant hemp extract oil, capsules, dried cannabis, or as a liquid solution. CBD is thought not have the same psychoactivity as THC, and may affect the actions of THC. Studies suggest that CBD may interact with different biological targets, including cannabinoid receptors and other neurotransmitter receptors. As of 2018 the mechanism of action for its biological effects has not been determined.  In the [place], cannabidiol has a limited  approval by the Food and Drug Administration (FDA) for treatment of only two types   of epilepsy disorders. The side effects of long-term use of the drug include somnolence, decreased appetite, diarrhea, fatigue, malaise, weakness, sleeping problems, and others.  CBD remains a Schedule I drug prohibited for any use.  Legality: Some manufacturers ship CBD products nationally, an illegal action which the FDA has not enforced in 2018, with CBD remaining the subject of an FDA investigational new drug evaluation, and is not considered legal as a dietary supplement or food ingredient as of December 2018. Federal illegality has made it difficult historically to conduct research on CBD. CBD is openly sold in head shops and health food stores in some states where such sales have not been explicitly legalized.  Warning: Because it is not FDA approved for general use or treatment of pain, it is not required to undergo the same manufacturing controls as prescription drugs.  This means that the available cannabidiol (CBD) may be contaminated with THC.  If this is the case, it will trigger a positive urine drug screen (UDS) test for cannabinoids (Marijuana).  Because a positive UDS for illicit substances is a violation of our medication agreement, your opioid analgesics (pain medicine) may be permanently discontinued. (Last update: 06/05/2017) ____________________________________________________________________________________________    

## 2018-08-17 NOTE — Progress Notes (Signed)
Pain Management Virtual Encounter Note - Virtual Visit via Telephone Telehealth (real-time audio visits between healthcare provider and patient).  Patient's Phone No. & Preferred Pharmacy:  [phone] (home); [phone] (mobile); (Preferred) [phone] [email or web address]  [place], [place] - [address] [place] [address] Phone: [phone] Fax: [phone]  [place], [place] - [address] [place] [address] Phone: [phone] Fax: [phone]   Pre-screening note:  Our staff contacted Chad Wilson and offered him an "in person", "face-to-face" appointment versus a telephone encounter. He indicated preferring the telephone encounter, at this time.  Reason for Virtual Visit: COVID-19*  Social distancing based on CDC and AMA recommendations.   I contacted Chad Wilson on 08/17/2018 at 11:15 AM via telephone.      I clearly identified myself as Chad Cola, MD. I verified that I was speaking with the correct person using two identifiers (Name and date of birth: Jul 26, 1975).  Advanced Informed Consent I sought verbal advanced consent from Chad Wilson for virtual visit interactions. I informed [name] of possible security and privacy concerns, risks, and limitations associated with providing "not-in-person" medical evaluation and management services. I also informed Mr. Pignato of the availability of "in-person" appointments. Finally, I informed him that there would be a charge for the virtual visit and that he could be  personally, fully or partially, financially responsible for it. [name] expressed understanding and agreed to proceed.   Historic Elements   [name] is a 43 y.o. year old, male patient evaluated today after his last encounter by our practice on 07/22/2018. Chad Wilson  has a past medical history of Acute  hepatitis C without mention of hepatic coma(070.51), Alcohol abuse, unspecified, Anxiety and depression, Anxiety states, Essential hypertension, benign, Graves disease, Panic disorder without agoraphobia, Thyroid disease, and Tobacco use disorder. He also  has a past surgical history that includes Multiple tooth extractions (15 yrs. ago) and Anterior cervical decomp/discectomy fusion (N/A, 05/21/2017). Chad Wilson has a current medication list which includes the following prescription(s): albuterol, atorvastatin, vitamin d3, clonazepam, depo-testosterone, levothyroxine, lisinopril, oxycodone, and omeprazole. He  reports that he has been smoking. He has a 30.00 pack-year smoking history. His smokeless tobacco use includes chew. He reports previous alcohol use. He reports that he does not use drugs. Chad Wilson is allergic to chantix [varenicline].   HPI  I last communicated with him on 07/22/2018. Today, he is being contacted for medication management.  Pharmacotherapy Assessment  Analgesic: Oxycodone IR 5 mg every 6 hours (20 mg/day of oxycodone) MME/day: 30 mg/day.   Monitoring: Pharmacotherapy: No side-effects or adverse reactions reported. [place] PMP: PDMP reviewed during this encounter.       Compliance: No problems identified. Effectiveness: Clinically acceptable. Plan: Refer to "POC".  Pertinent Labs  Renal Function Lab Results  Component Value Date   BUN 6 06/03/2018   CREATININE 0.76 06/03/2018   BCR 8 (L) 06/03/2018   GFRAA 128 06/03/2018   GFRNONAA 111 06/03/2018   Hepatic Function Lab Results  Component Value Date   AST 23 06/03/2018   ALT 25 10/17/2017   ALBUMIN 4.6 06/03/2018   UDS Summary  Date Value Ref Range Status  06/03/2018 FINAL  Final    Comment:    ==================================================================== TOXASSURE COMP DRUG ANALYSIS,UR ==================================================================== Test  Result       Flag        Units Drug Present   Lorazepam                      219                     ng/mg creat    Source of lorazepam is a scheduled prescription medication.   Hydrocodone                    1871                    ng/mg creat   Hydromorphone                  463                     ng/mg creat   Dihydrocodeine                 252                     ng/mg creat   Norhydrocodone                 1617                    ng/mg creat    Sources of hydrocodone include scheduled prescription    medications. Hydromorphone, dihydrocodeine and norhydrocodone are    expected metabolites of hydrocodone. Hydromorphone and    dihydrocodeine are also available as scheduled prescription    medications.   Zolpidem                       PRESENT   Zolpidem Acid                  PRESENT    Zolpidem acid is an expected metabolite of zolpidem.   Sertraline                     PRESENT   Desmethylsertraline            PRESENT    Desmethylsertraline is an expected metabolite of sertraline.   Acetaminophen                  PRESENT ==================================================================== Test                      Result    Flag   Units      Ref Range   Creatinine              52               mg/dL      >=20 ==================================================================== Declared Medications:  Medication list was not provided. ==================================================================== For clinical consultation, please call [phone]. ====================================================================    Note: Above Lab results reviewed.  Recent imaging  MR LUMBAR SPINE WO CONTRAST CLINICAL DATA:  43 year old male with 2 months of low back pain. No known injury.  EXAM: MRI LUMBAR SPINE WITHOUT CONTRAST  TECHNIQUE: Multiplanar, multisequence MR imaging of the lumbar spine was performed. No intravenous contrast was administered.  COMPARISON:  Lumbar MRI 02/02/2017.   Lumbar radiographs 12/31/2016.  FINDINGS: Segmentation: Normal on the comparison radiographs which is the same numbering system used on the 2018 MRI.  Alignment: Stable lumbar  lordosis. Subtle retrolisthesis of L5 on S1.  Vertebrae: Subtle degenerative endplate marrow edema anteriorly at L3-L4, associated with chronic degenerative endplate spurring. Normal background bone marrow signal. No other marrow edema or acute osseous abnormality. Intact visible sacrum and SI joints.  Conus medullaris and cauda equina: Conus extends to the L1 level. No lower spinal cord or conus signal abnormality.  Paraspinal and other soft tissues: Negative.  Disc levels:  T11-T12: Mild disc desiccation.  T12-L1:  Negative.  L1-L2:  Negative.  L2-L3:  Minimal disc bulging and facet hypertrophy.  No stenosis.  L3-L4: Mild disc desiccation and circumferential disc bulge. Mild facet hypertrophy with trace degenerative facet joint fluid. No spinal or lateral recess stenosis. Mild bilateral L3 foraminal stenosis is stable.  L4-L5: Mild far lateral disc bulging and endplate spurring. Mild facet hypertrophy. Trace facet joint fluid and occasional posterior small synovial cysts. No spinal or lateral recess stenosis. Mild bilateral L4 foraminal stenosis is stable.  L5-S1:  Borderline to mild facet hypertrophy.  No stenosis.  IMPRESSION: Stable MRI appearance of the lumbar spine since 2018. Mild degenerative disc bulging. Multilevel mild facet hypertrophy.  No spinal or lateral recess stenosis. Mild bilateral L3 and L4 foraminal stenosis.  Electronically Signed   By: Genevie Ann M.D.   On: 04/24/2018 20:30  Assessment  The primary encounter diagnosis was Chronic pain syndrome. Diagnoses of Chronic low back pain (Primary area of Pain) (Bilateral) (R=L) w/o sciatica, Chronic neck pain with history of cervical spinal surgery, DDD (degenerative disc disease), lumbosacral, and DDD (degenerative disc  disease), cervical were also pertinent to this visit.  Plan of Care  I have discontinued Chad E. Shipley's sildenafil and sertraline. I have also changed his oxyCODONE. Additionally, I am having him maintain his lisinopril, atorvastatin, Depo-Testosterone, Vitamin D3, albuterol, levothyroxine, and clonazePAM.  Pharmacotherapy (Medications Ordered): Meds ordered this encounter  Medications  . oxyCODONE (OXY IR/ROXICODONE) 5 MG immediate release tablet    Sig: Take 1 tablet (5 mg total) by mouth every 6 (six) hours as needed for up to 30 days for severe pain. Avoid clonazepam taking within 8 hrs    Dispense:  120 tablet    Refill:  0    Chronic Pain: STOP Act - Not applicable. Fill 1 day early if closed on scheduled refill date. Do not fill until: 08/28/18. To last until: 09/27/18. Instruct to avoid benzodiazepines within 8 hours of opioids.   Orders:  No orders of the defined types were placed in this encounter.  Follow-up plan:   Return for (1 mo) Med-Mgmt, (Virtual Visit).    I discussed the assessment and treatment plan with the patient. The patient was provided an opportunity to ask questions and all were answered. The patient agreed with the plan and demonstrated an understanding of the instructions.  Patient advised to call back or seek an in-person evaluation if the symptoms or condition worsens.  Total duration of non-face-to-face encounter: 12 minutes.  Note by: Chad Cola, MD Date: 08/17/2018; Time: 11:34 AM  Note: This dictation was prepared with Dragon dictation. Any transcriptional errors that may result from this process are unintentional.  Disclaimer:  * Given the special circumstances of the COVID-19 pandemic, the federal government has announced that the Office for Civil Rights (OCR) will exercise its enforcement discretion and will not impose penalties on physicians using telehealth in the event of noncompliance with regulatory requirements under the [place] (HIPAA) in connection with the good faith provision  of telehealth during the UJNWM-68 national public health emergency. (AMA)

## 2018-08-18 MED FILL — clonazePAM 0.5 MG TABS: 0.5 | 15 days supply | Qty: 30 | Fill #0

## 2018-08-19 NOTE — Telephone Encounter (Signed)
Okay, this clearly is a problem this is the third time this happens.  When I spoke to him I was very clear in asking what his pharmacy of choice was.  I went ahead and selected that and I sent that to the place where he asked me to send it.  Call him, make sure that this gets corrected, and inform him that should it happen again, I will not be calling to get it fixed again.  If this continues to be a problem, I will send the prescriptions to that [facility] employee pharmacy and he will need to come back here to pick it up.

## 2018-08-20 ENCOUNTER — Other Ambulatory Visit: Payer: Self-pay

## 2018-08-20 ENCOUNTER — Other Ambulatory Visit
Admission: RE | Admit: 2018-08-20 | Discharge: 2018-08-20 | Disposition: A | Discharge status: Home / Self Care | Payer: 59 | Source: Ambulatory Visit | Attending: Pain Medicine | Admitting: Pain Medicine

## 2018-08-20 DIAGNOSIS — Z1159 Encounter for screening for other viral diseases: Secondary | ICD-10-CM | POA: Diagnosis not present

## 2018-08-20 DIAGNOSIS — Z01810 Encounter for preprocedural cardiovascular examination: Secondary | ICD-10-CM

## 2018-08-21 LAB — NOVEL CORONAVIRUS, NAA (HOSP ORDER, SEND-OUT TO REF LAB; TAT 18-24 HRS): SARS-CoV-2, NAA: NOT DETECTED

## 2018-08-25 ENCOUNTER — Other Ambulatory Visit: Payer: Self-pay

## 2018-08-25 ENCOUNTER — Encounter: Payer: Self-pay | Admitting: Pain Medicine

## 2018-08-25 ENCOUNTER — Ambulatory Visit
Admission: RE | Admit: 2018-08-25 | Discharge: 2018-08-25 | Disposition: A | Discharge status: Home / Self Care | Payer: 59 | Source: Ambulatory Visit | Attending: Pain Medicine | Admitting: Pain Medicine

## 2018-08-25 ENCOUNTER — Ambulatory Visit (HOSPITAL_BASED_OUTPATIENT_CLINIC_OR_DEPARTMENT_OTHER): Payer: 59 | Admitting: Pain Medicine

## 2018-08-25 VITALS — BP 162/102 | HR 79 | Temp 97.9°F | Resp 17 | Ht 66.0 in | Wt 180.0 lb

## 2018-08-25 DIAGNOSIS — M47817 Spondylosis without myelopathy or radiculopathy, lumbosacral region: Secondary | ICD-10-CM | POA: Insufficient documentation

## 2018-08-25 DIAGNOSIS — M51379 Other intervertebral disc degeneration, lumbosacral region without mention of lumbar back pain or lower extremity pain: Secondary | ICD-10-CM

## 2018-08-25 DIAGNOSIS — M47816 Spondylosis without myelopathy or radiculopathy, lumbar region: Secondary | ICD-10-CM | POA: Insufficient documentation

## 2018-08-25 DIAGNOSIS — G8929 Other chronic pain: Secondary | ICD-10-CM | POA: Diagnosis not present

## 2018-08-25 DIAGNOSIS — M5137 Other intervertebral disc degeneration, lumbosacral region: Secondary | ICD-10-CM | POA: Diagnosis not present

## 2018-08-25 DIAGNOSIS — M545 Low back pain: Secondary | ICD-10-CM | POA: Insufficient documentation

## 2018-08-25 MED ORDER — TRIAMCINOLONE ACETONIDE 40 MG/ML IJ SUSP
80.0000 mg | Freq: Once | INTRAMUSCULAR | Status: AC
  Administered 2018-08-25: 80 mg
  Filled 2018-08-25: qty 2

## 2018-08-25 MED ORDER — MIDAZOLAM HCL 5 MG/5ML IJ SOLN
1.0000 mg | INTRAMUSCULAR | Status: DC | PRN
  Administered 2018-08-25: 5 mg via INTRAVENOUS
  Filled 2018-08-25: qty 5

## 2018-08-25 MED ORDER — FENTANYL CITRATE (PF) 100 MCG/2ML IJ SOLN
25.0000 ug | INTRAMUSCULAR | Status: DC | PRN
  Administered 2018-08-25: 100 ug via INTRAVENOUS
  Filled 2018-08-25: qty 2

## 2018-08-25 MED ORDER — ROPIVACAINE HCL 2 MG/ML IJ SOLN
INTRAMUSCULAR | Status: AC
  Filled 2018-08-25: qty 10

## 2018-08-25 MED ORDER — LACTATED RINGERS IV SOLN
1000.0000 mL | Freq: Once | INTRAVENOUS | Status: AC
  Administered 2018-08-25: 1000 mL via INTRAVENOUS

## 2018-08-25 MED ORDER — LIDOCAINE HCL 2 % IJ SOLN
20.0000 mL | Freq: Once | INTRAMUSCULAR | Status: AC
  Administered 2018-08-25: 400 mg
  Filled 2018-08-25: qty 20

## 2018-08-25 MED ORDER — ROPIVACAINE HCL 2 MG/ML IJ SOLN
18.0000 mL | Freq: Once | INTRAMUSCULAR | Status: AC
  Administered 2018-08-25: 18 mL via PERINEURAL
  Filled 2018-08-25: qty 20

## 2018-08-25 NOTE — Progress Notes (Signed)
Safety precautions to be maintained throughout the outpatient stay will include: orient to surroundings, keep bed in low position, maintain call bell within reach at all times, provide assistance with transfer out of bed and ambulation.  

## 2018-08-25 NOTE — Patient Instructions (Signed)

## 2018-08-25 NOTE — Progress Notes (Signed)
Patient's Name: Chad Wilson  MRN: [id number]  Referring Provider: Dorothyann Peng, NP  DOB: June 10, 1975  PCP: Dorothyann Peng, NP  DOS: 08/25/2018  Note by: [name], MD  Service setting: Ambulatory outpatient  Specialty: Interventional Pain Management  Patient type: Established  Location: [facility] (AMB) Pain Management Facility  Visit type: Interventional Procedure   Primary Reason for Visit: Interventional Pain Management Treatment. CC: Back Pain (low)  Procedure:          Anesthesia, Analgesia, Anxiolysis:  Type: Lumbar Facet, Medial Branch Block(s) #1  Primary Purpose: Diagnostic Region: Posterolateral Lumbosacral Spine Level: L2, L3, L4, L5, & S1 Medial Branch Level(s). Injecting these levels blocks the L3-4, L4-5, and L5-S1 lumbar facet joints. Laterality: Bilateral  Type: Moderate (Conscious) Sedation combined with Local Anesthesia Indication(s): Analgesia and Anxiety Route: Intravenous (IV) IV Access: Secured Sedation: Meaningful verbal contact was maintained at all times during the procedure  Local Anesthetic: Lidocaine 1-2%  Position: Prone   Indications: 1. Spondylosis without myelopathy or radiculopathy, lumbosacral region   2. Lumbar facet syndrome (Bilateral)   3. Lumbar facet hypertrophy (Bilateral)   4. Osteoarthritis of facet joint of lumbar spine   5. DDD (degenerative disc disease), lumbosacral   6. Chronic low back pain (Primary area of Pain) (Bilateral) (R=L) w/o sciatica    Pain Score: Pre-procedure: 7 /10 Post-procedure: 0-No pain/10  Pre-op Assessment:  Chad Wilson is a 43 y.o. (year old), male patient, seen today for interventional treatment. He  has a past surgical history that includes Multiple tooth extractions (15 yrs. ago) and Anterior cervical decomp/discectomy fusion (N/A, 05/21/2017). Chad Wilson has a current medication list which includes the following prescription(s): albuterol, atorvastatin, vitamin d3, clonazepam, depo-testosterone,  levothyroxine, lisinopril, oxycodone, sildenafil, and omeprazole, and the following Facility-Administered Medications: fentanyl and midazolam. His primarily concern today is the Back Pain (low)  Initial Vital Signs:  Pulse/HCG Rate: 79ECG Heart Rate: 83 Temp: 97.9 F (36.6 C) Resp: 18 BP: (!) 165/98 SpO2: 97 %  BMI: Estimated body mass index is 29.05 kg/m as calculated from the following:   Height as of this encounter: 5' 6"  (1.676 m).   Weight as of this encounter: 180 lb (81.6 kg).  Risk Assessment: Allergies: Reviewed. He is allergic to chantix [varenicline].  Allergy Precautions: None required Coagulopathies: Reviewed. None identified.  Blood-thinner therapy: None at this time Active Infection(s): Reviewed. None identified. Chad Wilson is afebrile  Site Confirmation: Chad Wilson was asked to confirm the procedure and laterality before marking the site Procedure checklist: Completed Consent: Before the procedure and under the influence of no sedative(s), amnesic(s), or anxiolytics, the patient was informed of the treatment options, risks and possible complications. To fulfill our ethical and legal obligations, as recommended by the American Medical Association's Code of Ethics, I have informed the patient of my clinical impression; the nature and purpose of the treatment or procedure; the risks, benefits, and possible complications of the intervention; the alternatives, including doing nothing; the risk(s) and benefit(s) of the alternative treatment(s) or procedure(s); and the risk(s) and benefit(s) of doing nothing. The patient was provided information about the general risks and possible complications associated with the procedure. These may include, but are not limited to: failure to achieve desired goals, infection, bleeding, organ or nerve damage, allergic reactions, paralysis, and death. In addition, the patient was informed of those risks and complications associated to Spine-related  procedures, such as failure to decrease pain; infection (i.e.: Meningitis, epidural or intraspinal abscess); bleeding (i.e.: epidural hematoma, subarachnoid  hemorrhage, or any other type of intraspinal or peri-dural bleeding); organ or nerve damage (i.e.: Any type of peripheral nerve, nerve root, or spinal cord injury) with subsequent damage to sensory, motor, and/or autonomic systems, resulting in permanent pain, numbness, and/or weakness of one or several areas of the body; allergic reactions; (i.e.: anaphylactic reaction); and/or death. Furthermore, the patient was informed of those risks and complications associated with the medications. These include, but are not limited to: allergic reactions (i.e.: anaphylactic or anaphylactoid reaction(s)); adrenal axis suppression; blood sugar elevation that in diabetics may result in ketoacidosis or comma; water retention that in patients with history of congestive heart failure may result in shortness of breath, pulmonary edema, and decompensation with resultant heart failure; weight gain; swelling or edema; medication-induced neural toxicity; particulate matter embolism and blood vessel occlusion with resultant organ, and/or nervous system infarction; and/or aseptic necrosis of one or more joints. Finally, the patient was informed that Medicine is not an exact science; therefore, there is also the possibility of unforeseen or unpredictable risks and/or possible complications that may result in a catastrophic outcome. The patient indicated having understood very clearly. We have given the patient no guarantees and we have made no promises. Enough time was given to the patient to ask questions, all of which were answered to the patient's satisfaction. Chad Wilson has indicated that he wanted to continue with the procedure. Attestation: I, the ordering provider, attest that I have discussed with the patient the benefits, risks, side-effects, alternatives, likelihood of  achieving goals, and potential problems during recovery for the procedure that I have provided informed consent. Date   Time: 08/25/2018  8:25 AM  Pre-Procedure Preparation:  Monitoring: As per clinic protocol. Respiration, ETCO2, SpO2, BP, heart rate and rhythm monitor placed and checked for adequate function Safety Precautions: Patient was assessed for positional comfort and pressure points before starting the procedure. Time-out: I initiated and conducted the "Time-out" before starting the procedure, as per protocol. The patient was asked to participate by confirming the accuracy of the "Time Out" information. Verification of the correct person, site, and procedure were performed and confirmed by me, the nursing staff, and the patient. "Time-out" conducted as per Joint Commission's Universal Protocol (UP.01.01.01). Time: 1013  Description of Procedure:          Laterality: Bilateral. The procedure was performed in identical fashion on both sides. Levels:  L2, L3, L4, L5, & S1 Medial Branch Level(s) Area Prepped: Posterior Lumbosacral Region Prepping solution: 47M DuraPrep (Iodine Povacrylex [0.7% available iodine] and Isopropyl Alcohol, 74% w/w) Safety Precautions: Aspiration looking for blood return was conducted prior to all injections. At no point did we inject any substances, as a needle was being advanced. Before injecting, the patient was told to immediately notify me if he was experiencing any new onset of "ringing in the ears, or metallic taste in the mouth". No attempts were made at seeking any paresthesias. Safe injection practices and needle disposal techniques used. Medications properly checked for expiration dates. SDV (single dose vial) medications used. After the completion of the procedure, all disposable equipment used was discarded in the proper designated medical waste containers. Local Anesthesia: Protocol guidelines were followed. The patient was positioned over the fluoroscopy  table. The area was prepped in the usual manner. The time-out was completed. The target area was identified using fluoroscopy. A 12-in long, straight, sterile hemostat was used with fluoroscopic guidance to locate the targets for each level blocked. Once located, the skin was marked with  an approved surgical skin marker. Once all sites were marked, the skin (epidermis, dermis, and hypodermis), as well as deeper tissues (fat, connective tissue and muscle) were infiltrated with a small amount of a short-acting local anesthetic, loaded on a 10cc syringe with a 25G, 1.5-in  Needle. An appropriate amount of time was allowed for local anesthetics to take effect before proceeding to the next step. Local Anesthetic: Lidocaine 2.0% The unused portion of the local anesthetic was discarded in the proper designated containers. Technical explanation of process:  L2 Medial Branch Nerve Block (MBB): The target area for the L2 medial branch is at the junction of the postero-lateral aspect of the superior articular process and the superior, posterior, and medial edge of the transverse process of L3. Under fluoroscopic guidance, a Quincke needle was inserted until contact was made with os over the superior postero-lateral aspect of the pedicular shadow (target area). After negative aspiration for blood, 0.5 mL of the nerve block solution was injected without difficulty or complication. The needle was removed intact. L3 Medial Branch Nerve Block (MBB): The target area for the L3 medial branch is at the junction of the postero-lateral aspect of the superior articular process and the superior, posterior, and medial edge of the transverse process of L4. Under fluoroscopic guidance, a Quincke needle was inserted until contact was made with os over the superior postero-lateral aspect of the pedicular shadow (target area). After negative aspiration for blood, 0.5 mL of the nerve block solution was injected without difficulty or  complication. The needle was removed intact. L4 Medial Branch Nerve Block (MBB): The target area for the L4 medial branch is at the junction of the postero-lateral aspect of the superior articular process and the superior, posterior, and medial edge of the transverse process of L5. Under fluoroscopic guidance, a Quincke needle was inserted until contact was made with os over the superior postero-lateral aspect of the pedicular shadow (target area). After negative aspiration for blood, 0.5 mL of the nerve block solution was injected without difficulty or complication. The needle was removed intact. L5 Medial Branch Nerve Block (MBB): The target area for the L5 medial branch is at the junction of the postero-lateral aspect of the superior articular process and the superior, posterior, and medial edge of the sacral ala. Under fluoroscopic guidance, a Quincke needle was inserted until contact was made with os over the superior postero-lateral aspect of the pedicular shadow (target area). After negative aspiration for blood, 0.5 mL of the nerve block solution was injected without difficulty or complication. The needle was removed intact. S1 Medial Branch Nerve Block (MBB): The target area for the S1 medial branch is at the posterior and inferior 6 o'clock position of the L5-S1 facet joint. Under fluoroscopic guidance, the Quincke needle inserted for the L5 MBB was redirected until contact was made with os over the inferior and postero aspect of the sacrum, at the 6 o' clock position under the L5-S1 facet joint (Target area). After negative aspiration for blood, 0.5 mL of the nerve block solution was injected without difficulty or complication. The needle was removed intact.  Nerve block solution: 0.2% PF-Ropivacaine + Triamcinolone (40 mg/mL) diluted to a final concentration of 4 mg of Triamcinolone/mL of Ropivacaine The unused portion of the solution was discarded in the proper designated containers. Procedural  Needles: 22-gauge, 3.5-inch, Quincke needles used for all levels.  Once the entire procedure was completed, the treated area was cleaned, making sure to leave some of the prepping solution  back to take advantage of its long term bactericidal properties.   Illustration of the posterior view of the lumbar spine and the posterior neural structures. Laminae of L2 through S1 are labeled. DPRL5, dorsal primary ramus of L5; DPRS1, dorsal primary ramus of S1; DPR3, dorsal primary ramus of L3; FJ, facet (zygapophyseal) joint L3-L4; I, inferior articular process of L4; LB1, lateral branch of dorsal primary ramus of L1; IAB, inferior articular branches from L3 medial branch (supplies L4-L5 facet joint); IBP, intermediate branch plexus; MB3, medial branch of dorsal primary ramus of L3; NR3, third lumbar nerve root; S, superior articular process of L5; SAB, superior articular branches from L4 (supplies L4-5 facet joint also); TP3, transverse process of L3.  Vitals:   08/25/18 1034 08/25/18 1035 08/25/18 1044 08/25/18 1054  BP: (!) 162/115  (!) 147/108 (!) 162/102  Pulse:      Resp: 15  19 17   Temp:      SpO2: 92% 95% 95% 98%  Weight:      Height:         Start Time: 1013 hrs. End Time: 1025 hrs.  Imaging Guidance (Spinal):          Type of Imaging Technique: Fluoroscopy Guidance (Spinal) Indication(s): Assistance in needle guidance and placement for procedures requiring needle placement in or near specific anatomical locations not easily accessible without such assistance. Exposure Time: Please see nurses notes. Contrast: None used. Fluoroscopic Guidance: I was personally present during the use of fluoroscopy. "Tunnel Vision Technique" used to obtain the best possible view of the target area. Parallax error corrected before commencing the procedure. "Direction-depth-direction" technique used to introduce the needle under continuous pulsed fluoroscopy. Once target was reached, antero-posterior, oblique,  and lateral fluoroscopic projection used confirm needle placement in all planes. Images permanently stored in EMR. Interpretation: No contrast injected. I personally interpreted the imaging intraoperatively. Adequate needle placement confirmed in multiple planes. Permanent images saved into the patient's record.  Antibiotic Prophylaxis:   Anti-infectives (From admission, onward)   None     Indication(s): None identified  Post-operative Assessment:  Post-procedure Vital Signs:  Pulse/HCG Rate: 7976 Temp: 97.9 F (36.6 C) Resp: 17 BP: (!) 162/102 SpO2: 98 %  EBL: None  Complications: No immediate post-treatment complications observed by team, or reported by patient.  Note: The patient tolerated the entire procedure well. A repeat set of vitals were taken after the procedure and the patient was kept under observation following institutional policy, for this type of procedure. Post-procedural neurological assessment was performed, showing return to baseline, prior to discharge. The patient was provided with post-procedure discharge instructions, including a section on how to identify potential problems. Should any problems arise concerning this procedure, the patient was given instructions to immediately contact us, at any time, without hesitation. In any case, we plan to contact the patient by telephone for a follow-up status report regarding this interventional procedure.  Comments:  No additional relevant information.  Plan of Care  Orders:  Orders Placed This Encounter  Procedures   LUMBAR FACET(MEDIAL BRANCH NERVE BLOCK) MBNB    Scheduling Instructions:     Side: Bilateral     Level: L3-4, L4-5, & L5-S1 Facets (L2, L3, L4, L5, & S1 Medial Branch Nerves)     Sedation: With Sedation.     Timeframe: Today    Order Specific Question:   Where will this procedure be performed?    Answer:   [facility]   DG PAIN CLINIC C-ARM 1-60 MIN  NO REPORT    Intraoperative  interpretation by procedural physician at [place].    Standing Status:   Standing    Number of Occurrences:   1    Order Specific Question:   Reason for exam:    Answer:   Assistance in needle guidance and placement for procedures requiring needle placement in or near specific anatomical locations not easily accessible without such assistance.   Provider attestation of informed consent for procedure/surgical case    I, the ordering provider, attest that I have discussed with the patient the benefits, risks, side effects, alternatives, likelihood of achieving goals and potential problems during recovery for the procedure that I have provided informed consent.    Standing Status:   Standing    Number of Occurrences:   1   Informed Consent Details: Transcribe to consent form and obtain patient signature    Standing Status:   Standing    Number of Occurrences:   1    Order Specific Question:   Procedure    Answer:   Bilateral Lumbar facet block (medial branch block) under fluoroscopic guidance. (See notes for levels.)    Order Specific Question:   Surgeon    Answer:   Marjean Imperato A. Dossie Arbour, MD    Order Specific Question:   Indication/Reason    Answer:   Bilateral low back pain with or without lower extremity pain   Medications ordered for procedure: Meds ordered this encounter  Medications   lidocaine (XYLOCAINE) 2 % (with pres) injection 400 mg   lactated ringers infusion 1,000 mL   midazolam (VERSED) 5 MG/5ML injection 1-2 mg    Make sure Flumazenil is available in the pyxis when using this medication. If oversedation occurs, administer 0.2 mg IV over 15 sec. If after 45 sec no response, administer 0.2 mg again over 1 min; may repeat at 1 min intervals; not to exceed 4 doses (1 mg)   fentaNYL (SUBLIMAZE) injection 25-50 mcg    Make sure Narcan is available in the pyxis when using this medication. In the event of respiratory depression (RR< 8/min): Titrate NARCAN  (naloxone) in increments of 0.1 to 0.2 mg IV at 2-3 minute intervals, until desired degree of reversal.   ropivacaine (PF) 2 mg/mL (0.2%) (NAROPIN) injection 18 mL   triamcinolone acetonide (KENALOG-40) injection 80 mg   Medications administered: We administered lidocaine, lactated ringers, midazolam, fentaNYL, ropivacaine (PF) 2 mg/mL (0.2%), and triamcinolone acetonide.  See the medical record for exact dosing, route, and time of administration.  Disposition: Discharge home  Discharge Date & Time: 08/25/2018; 1100 hrs.   Follow-up plan:   Return in about 2 weeks (around 09/08/2018) for (Virtual), E/M, (Post-procedrure).     Future Appointments  Date Time Provider [place]  09/07/2018  1:15 PM Milinda Pointer, MD [facility] None  09/16/2018  9:00 AM Milinda Pointer, MD [facility] None   Primary Care Physician: Dorothyann Peng, NP Location: [facility] Note by: [name], MD Date: 08/25/2018; Time: 11:35 AM  Disclaimer:  Medicine is not an exact science. The only guarantee in medicine is that nothing is guaranteed. It is important to note that the decision to proceed with this intervention was based on the information collected from the patient. The Data and conclusions were drawn from the patient's questionnaire, the interview, and the physical examination. Because the information was provided in large part by the patient, it cannot be guaranteed that it has not been purposely or unconsciously manipulated.  Every effort has been made to obtain as much relevant data as possible for this evaluation. It is important to note that the conclusions that lead to this procedure are derived in large part from the available data. Always take into account that the treatment will also be dependent on availability of resources and existing treatment guidelines, considered by other Pain Management Practitioners as being common knowledge and practice, at the time  of the intervention. For Medico-Legal purposes, it is also important to point out that variation in procedural techniques and pharmacological choices are the acceptable norm. The indications, contraindications, technique, and results of the above procedure should only be interpreted and judged by a Board-Certified Interventional Pain Specialist with extensive familiarity and expertise in the same exact procedure and technique.

## 2018-08-26 NOTE — Telephone Encounter (Signed)
Post procedure phone call.  LM 

## 2018-08-27 ENCOUNTER — Encounter: Payer: Self-pay | Admitting: Pain Medicine

## 2018-08-27 ENCOUNTER — Other Ambulatory Visit: Payer: Self-pay | Admitting: Pain Medicine

## 2018-08-27 ENCOUNTER — Telehealth: Payer: Self-pay | Admitting: *Deleted

## 2018-08-27 ENCOUNTER — Encounter: Payer: Self-pay | Admitting: Adult Health

## 2018-08-27 DIAGNOSIS — G894 Chronic pain syndrome: Secondary | ICD-10-CM

## 2018-08-27 MED ORDER — OXYCODONE HCL 5 MG PO TABS: 5.0000 mg | ORAL_TABLET | Freq: Four times a day (QID) | ORAL | 0 refills | Status: DC | PRN

## 2018-08-27 NOTE — Telephone Encounter (Signed)
Call the transition of care pharmacy and cancel the old prescription, I just sent it to the [facility]. There will be no more changes from this.

## 2018-08-27 NOTE — Telephone Encounter (Signed)
Spoke with Nicole Kindred in Transitions of Care pharmacy, inpatient at [facility], he will void the Rx for oxycodone that was sent to him.  I will call [place] and make him aware of this and to let him know that Dr Dossie Arbour feels that this has happened several times and he will not be making any changes.  I will educate the patient to please make sure that his pharmacy where medication is supposed to be sent is correct in his chart.

## 2018-08-28 MED FILL — oxyCODONE HCL 5 MG TABS: 5 | 30 days supply | Qty: 120 | Fill #0

## 2018-09-03 ENCOUNTER — Encounter: Payer: Self-pay | Admitting: Pain Medicine

## 2018-09-07 ENCOUNTER — Ambulatory Visit: Payer: 59 | Attending: Pain Medicine | Admitting: Pain Medicine

## 2018-09-07 ENCOUNTER — Other Ambulatory Visit: Payer: Self-pay

## 2018-09-07 DIAGNOSIS — M545 Low back pain, unspecified: Secondary | ICD-10-CM

## 2018-09-07 DIAGNOSIS — M4310 Spondylolisthesis, site unspecified: Secondary | ICD-10-CM | POA: Diagnosis not present

## 2018-09-07 DIAGNOSIS — M47816 Spondylosis without myelopathy or radiculopathy, lumbar region: Secondary | ICD-10-CM

## 2018-09-07 DIAGNOSIS — G894 Chronic pain syndrome: Secondary | ICD-10-CM

## 2018-09-07 DIAGNOSIS — G8929 Other chronic pain: Secondary | ICD-10-CM

## 2018-09-07 MED ORDER — OXYCODONE HCL 5 MG PO TABS: 5.0000 mg | ORAL_TABLET | Freq: Four times a day (QID) | ORAL | 0 refills | Status: DC | PRN

## 2018-09-07 NOTE — Progress Notes (Signed)
Pain Management Virtual Encounter Note - Virtual Visit via Telephone Telehealth (real-time audio visits between healthcare provider and patient).   Patient's Phone No. & Preferred Pharmacy:  [phone] (home); [phone] (mobile); (Preferred) [phone] [email or web address]  [place], [place]. [address] [place] [address] Phone: [phone] Fax: [phone]    Pre-screening note:  Our staff contacted Chad Wilson and offered him an "in person", "face-to-face" appointment versus a telephone encounter. He indicated preferring the telephone encounter, at this time.   Reason for Virtual Visit: COVID-19*  Social distancing based on CDC and AMA recommendations.   I contacted Chad Wilson on 09/07/2018 via telephone.      I clearly identified myself as [name], MD. I verified that I was speaking with the correct person using two identifiers (Name: Chad Wilson, and date of birth: 1975/09/27).  Advanced Informed Consent I sought verbal advanced consent from Chad Wilson for virtual visit interactions. I informed Chad Wilson of possible security and privacy concerns, risks, and limitations associated with providing "not-in-person" medical evaluation and management services. I also informed Chad Wilson of the availability of "in-person" appointments. Finally, I informed him that there would be a charge for the virtual visit and that he could be  personally, fully or partially, financially responsible for it. Chad Wilson expressed understanding and agreed to proceed.   Historic Elements   [name] is a 43 y.o. year old, male patient evaluated today after his last encounter by our practice on 08/27/2018. Chad Wilson  has a past medical history of Acute hepatitis C without mention of hepatic coma(070.51), Alcohol abuse, unspecified, Anxiety and depression, Anxiety states, Essential hypertension,  benign, Graves disease, Panic disorder without agoraphobia, Thyroid disease, and Tobacco use disorder. He also  has a past surgical history that includes Multiple tooth extractions (15 yrs. ago) and Anterior cervical decomp/discectomy fusion (N/A, 05/21/2017). Chad Wilson has a current medication list which includes the following prescription(s): albuterol, atorvastatin, vitamin d3, clonazepam, depo-testosterone, levothyroxine, lisinopril, oxycodone, sildenafil, and omeprazole. He  reports that he has been smoking. He has a 30.00 pack-year smoking history. His smokeless tobacco use includes chew. He reports previous alcohol use. He reports that he does not use drugs. Chad Wilson is allergic to chantix [varenicline].   HPI  Today, he is being contacted for both, medication management and a post-procedure assessment.  He indicates having done well with the diagnostic bilateral lumbar facet block.  For the initial day after the procedure he had complete relief of his symptoms.  Approximately 5 days after the procedure the symptoms started coming back, but he indicates that they are not as bad as they used to.  He has gone from taking his oxycodone every 6 hours to taking it twice daily.  Today we talked about the results of the procedure and the long-term plan.  He has decided to proceed with his second diagnostic injection.  We are hoping that the steroids on the second injection may provide him with better and/for longer relief of his low back pain and referred lower extremity pain.  However, if it does not but still provides him with good relief for the duration of the local anesthetic, then we will consider radiofrequency ablation next.  Pharmacotherapy Assessment  Analgesic: Oxycodone IR 5 mg every 6 hours (20 mg/day of oxycodone) MME/day: 30 mg/day.   Monitoring: Pharmacotherapy: No side-effects or adverse reactions reported. [place] PMP: PDMP reviewed during this  encounter.       Compliance: No problems  identified. Effectiveness: Clinically acceptable. Plan: Refer to "POC".  Post-Procedure Evaluation  Procedure: Diagnostic bilateral lumbar facet block #1 under fluoroscopic guidance and IV sedation Pre-procedure pain level:  7/10 Post-procedure: 0/10 (100% relief)  Sedation: Please see nurses note.  Effectiveness during initial hour after procedure(Ultra-Short Term Relief): 100 %   Local anesthetic used: Long-acting (4-6 hours) Effectiveness: Defined as any analgesic benefit obtained secondary to the administration of local anesthetics. This carries significant diagnostic value as to the etiological location, or anatomical origin, of the pain. Duration of benefit is expected to coincide with the duration of the local anesthetic used.  Effectiveness during initial 4-6 hours after procedure(Short-Term Relief): 100 %   Long-term benefit: Defined as any relief past the pharmacologic duration of the local anesthetics.  Effectiveness past the initial 6 hours after procedure(Long-Term Relief): 100 %(lasted 1 week)   Current benefits: Defined as benefit that persist at this time.   Analgesia:  Somewhat improved Function: Somewhat improved ROM: Somewhat improved  Pertinent Labs   SAFETY SCREENING Profile Lab Results  Component Value Date   SARSCOV2NAA NOT DETECTED 08/20/2018   COVIDSOURCE NASOPHARYNGEAL 08/20/2018   STAPHAUREUS NEGATIVE 05/07/2017   MRSAPCR NEGATIVE 05/07/2017   HCVAB (A) 09/29/2009    Reactive (NOTE) Result repeated and verified.                                                                       This test is for screening purposes only.  Reactive results should be confirmed by an alternative method.  Suggest HCV Qualitative, PCR, test code  83130.  Specimens will be stable for reflex testing up to 3 days after collection.   Renal Function Lab Results  Component Value Date   BUN 6 06/03/2018   CREATININE 0.76 06/03/2018   BCR 8 (L) 06/03/2018   GFRAA 128  06/03/2018   GFRNONAA 111 06/03/2018   Hepatic Function Lab Results  Component Value Date   AST 23 06/03/2018   ALT 25 10/17/2017   ALBUMIN 4.6 06/03/2018   UDS Summary  Date Value Ref Range Status  06/03/2018 FINAL  Final    Comment:    ==================================================================== TOXASSURE COMP DRUG ANALYSIS,UR ==================================================================== Test                             Result       Flag       Units Drug Present   Lorazepam                      219                     ng/mg creat    Source of lorazepam is a scheduled prescription medication.   Hydrocodone                    1871                    ng/mg creat   Hydromorphone                  463  ng/mg creat   Dihydrocodeine                 252                     ng/mg creat   Norhydrocodone                 1617                    ng/mg creat    Sources of hydrocodone include scheduled prescription    medications. Hydromorphone, dihydrocodeine and norhydrocodone are    expected metabolites of hydrocodone. Hydromorphone and    dihydrocodeine are also available as scheduled prescription    medications.   Zolpidem                       PRESENT   Zolpidem Acid                  PRESENT    Zolpidem acid is an expected metabolite of zolpidem.   Sertraline                     PRESENT   Desmethylsertraline            PRESENT    Desmethylsertraline is an expected metabolite of sertraline.   Acetaminophen                  PRESENT ==================================================================== Test                      Result    Flag   Units      Ref Range   Creatinine              52               mg/dL      >=20 ==================================================================== Declared Medications:  Medication list was not provided. ==================================================================== For clinical consultation, please  call [phone]. ====================================================================    Note: Above Lab results reviewed.  Recent imaging  MR LUMBAR SPINE WO CONTRAST CLINICAL DATA:  43 year old male with 2 months of low back pain. No known injury.  EXAM: MRI LUMBAR SPINE WITHOUT CONTRAST  TECHNIQUE: Multiplanar, multisequence MR imaging of the lumbar spine was performed. No intravenous contrast was administered.  COMPARISON:  Lumbar MRI 02/02/2017.  Lumbar radiographs 12/31/2016.  FINDINGS: Segmentation: Normal on the comparison radiographs which is the same numbering system used on the 2018 MRI.  Alignment: Stable lumbar lordosis. Subtle retrolisthesis of L5 on S1.  Vertebrae: Subtle degenerative endplate marrow edema anteriorly at L3-L4, associated with chronic degenerative endplate spurring. Normal background bone marrow signal. No other marrow edema or acute osseous abnormality. Intact visible sacrum and SI joints.  Conus medullaris and cauda equina: Conus extends to the L1 level. No lower spinal cord or conus signal abnormality.  Paraspinal and other soft tissues: Negative.  Disc levels:  T11-T12: Mild disc desiccation.  T12-L1:  Negative.  L1-L2:  Negative.  L2-L3:  Minimal disc bulging and facet hypertrophy.  No stenosis.  L3-L4: Mild disc desiccation and circumferential disc bulge. Mild facet hypertrophy with trace degenerative facet joint fluid. No spinal or lateral recess stenosis. Mild bilateral L3 foraminal stenosis is stable.  L4-L5: Mild far lateral disc bulging and endplate spurring. Mild facet hypertrophy. Trace facet joint fluid and occasional posterior small synovial cysts. No spinal or lateral recess stenosis. Mild bilateral L4 foraminal  stenosis is stable.  L5-S1:  Borderline to mild facet hypertrophy.  No stenosis.  IMPRESSION: Stable MRI appearance of the lumbar spine since 2018. Mild degenerative disc bulging. Multilevel mild  facet hypertrophy.  No spinal or lateral recess stenosis. Mild bilateral L3 and L4 foraminal stenosis.  Electronically Signed   By: Genevie Ann M.D.   On: 04/24/2018 20:30  Assessment  The primary encounter diagnosis was Chronic pain syndrome. Diagnoses of Chronic low back pain (Primary area of Pain) (Bilateral) (R=L) w/o sciatica, Grade 1 Lumbosacral Retrolisthesis of L5/S1, and Lumbar facet syndrome (Bilateral) were also pertinent to this visit.  Plan of Care  I am having Chad Wilson maintain his lisinopril, atorvastatin, Depo-Testosterone, Vitamin D3, albuterol, levothyroxine, clonazePAM, sildenafil, and oxyCODONE.  Pharmacotherapy (Medications Ordered): Meds ordered this encounter  Medications  . oxyCODONE (OXY IR/ROXICODONE) 5 MG immediate release tablet    Sig: Take 1 tablet (5 mg total) by mouth every 6 (six) hours as needed for up to 30 days for severe pain. Avoid clonazepam taking within 8 hrs    Dispense:  120 tablet    Refill:  0    Chronic Pain: STOP Act - Not applicable. Fill 1 day early if closed on scheduled refill date. Do not fill until: 09/27/2018. To last until: 10/27/2018. Instruct to avoid benzodiazepines within 8 hours of opioids.   Orders:  Orders Placed This Encounter  Procedures  . LUMBAR FACET(MEDIAL BRANCH NERVE BLOCK) MBNB    Standing Status:   Future    Standing Expiration Date:   10/07/2018    Scheduling Instructions:     Side: Bilateral     Level: L3-4, L4-5, & L5-S1 Facets (L2, L3, L4, L5, & S1 Medial Branch Nerves)     Sedation: Patient's choice.     Timeframe: ASAA    Order Specific Question:   Where will this procedure be performed?    Answer:   [facility]   Follow-up plan:   Return for Procedure (w/ sedation): (B) L-FCT BLK #2 (ASAA), in addition, (VV), E/M (MM) around 10/14/2018.Marland Kitchen    Recent Visits Date Type Provider Dept  08/25/18 Procedure visit Milinda Pointer, MD [facility]  08/17/18 Office Visit Milinda Pointer,  MD [facility]  07/22/18 Office Visit Milinda Pointer, MD [facility]  06/29/18 Office Visit Milinda Pointer, MD [facility]  Showing recent visits within past 90 days and meeting all other requirements   Today's Visits Date Type Provider Dept  09/07/18 Office Visit Milinda Pointer, MD [facility]  Showing today's visits and meeting all other requirements   Future Appointments Date Type Provider Dept  09/16/18 Appointment Milinda Pointer, MD [facility]  Showing future appointments within next 90 days and meeting all other requirements   I discussed the assessment and treatment plan with the patient. The patient was provided an opportunity to ask questions and all were answered. The patient agreed with the plan and demonstrated an understanding of the instructions.  Patient advised to call back or seek an in-person evaluation if the symptoms or condition worsens.  Total duration of non-face-to-face encounter: 12 minutes.  Note by: [name], MD Date: 09/07/2018; Time: 12:36 PM  Note: This dictation was prepared with Dragon dictation. Any transcriptional errors that may result from this process are unintentional.  Disclaimer:  * Given the special circumstances of the COVID-19 pandemic, the federal government has announced that the Office for Civil Rights (OCR) will exercise its enforcement discretion  and will not impose penalties on physicians using telehealth in the event of noncompliance with regulatory requirements under the [place] and Accountability Act (HIPAA) in connection with the good faith provision of telehealth during the AFHSV-07 national public health emergency. (AMA)

## 2018-09-07 NOTE — Patient Instructions (Addendum)

## 2018-09-11 ENCOUNTER — Other Ambulatory Visit: Admission: RE | Admit: 2018-09-11 | Payer: 59 | Source: Ambulatory Visit

## 2018-09-15 ENCOUNTER — Ambulatory Visit: Payer: 59 | Admitting: Pain Medicine

## 2018-09-15 ENCOUNTER — Encounter: Payer: Self-pay | Admitting: Pain Medicine

## 2018-09-16 ENCOUNTER — Ambulatory Visit: Payer: 59 | Attending: Pain Medicine | Admitting: Pain Medicine

## 2018-09-16 ENCOUNTER — Other Ambulatory Visit: Payer: Self-pay

## 2018-09-16 DIAGNOSIS — M5137 Other intervertebral disc degeneration, lumbosacral region: Secondary | ICD-10-CM

## 2018-09-16 DIAGNOSIS — G8929 Other chronic pain: Secondary | ICD-10-CM | POA: Diagnosis not present

## 2018-09-16 DIAGNOSIS — M47816 Spondylosis without myelopathy or radiculopathy, lumbar region: Secondary | ICD-10-CM | POA: Diagnosis not present

## 2018-09-16 DIAGNOSIS — M545 Low back pain: Secondary | ICD-10-CM

## 2018-09-16 NOTE — Progress Notes (Addendum)
Pain Management Virtual Encounter Note - Virtual Visit via Telephone Telehealth (real-time audio visits between healthcare provider and patient).   Patient's Phone No. & Preferred Pharmacy:  [phone] (home); [phone] (mobile); (Preferred) [phone] [email or web address]  [place], [place]. [address] [place] [address] Phone: [phone] Fax: [phone]    Pre-screening note:  Our staff contacted Chad Wilson and offered him an "in person", "face-to-face" appointment versus a telephone encounter. He indicated preferring the telephone encounter, at this time.   Reason for Virtual Visit: COVID-19*  Social distancing based on CDC and AMA recommendations.   I contacted Chad Wilson on 09/16/2018 via telephone.      I clearly identified myself as [name], MD. I verified that I was speaking with the correct person using two identifiers (Name: Chad Wilson, and date of birth: 08/25/1975).  Advanced Informed Consent I sought verbal advanced consent from Chad Wilson [place] for virtual visit interactions. I informed Chad Wilson of possible security and privacy concerns, risks, and limitations associated with providing "not-in-person" medical evaluation and management services. I also informed Chad Wilson of the availability of "in-person" appointments. Finally, I informed him that there would be a charge for the virtual visit and that he could be  personally, fully or partially, financially responsible for it. Chad Wilson expressed understanding and agreed to proceed.   Historic Elements   [name] Raneri is a 43 y.o. year old, male patient evaluated today after his last encounter by our practice on 09/07/2018. Chad Wilson  has a past medical history of Acute hepatitis C without mention of hepatic coma(070.51), Alcohol abuse, unspecified, Anxiety and depression, Anxiety states, Essential hypertension,  benign, Graves disease, Panic disorder without agoraphobia, Thyroid disease, and Tobacco use disorder. He also  has a past surgical history that includes Multiple tooth extractions (15 yrs. ago) and Anterior cervical decomp/discectomy fusion (N/A, 05/21/2017). Chad Wilson has a current medication list which includes the following prescription(s): albuterol, atorvastatin, vitamin d3, clonazepam, depo-testosterone, levothyroxine, lisinopril, oxycodone, sildenafil, and omeprazole. He  reports that he has been smoking. He has a 30.00 pack-year smoking history. His smokeless tobacco use includes chew. He reports previous alcohol use. He reports that he does not use drugs. Chad Wilson is allergic to chantix [varenicline].   HPI  Today, he is being contacted for follow-up evaluation.  The patient indicates that the benefits from the first bilateral lumbar facet block are now completely gone and he is back to baseline.  We have him scheduled for this Tuesday to come in for his second diagnostic lumbar facet block.  If he gets good benefit, similar to the first diagnostic injection, then we will consider radiofrequency ablation.  Pharmacotherapy Assessment  Analgesic: Oxycodone IR 5 mg every 6 hours (20 mg/day of oxycodone) MME/day: 30 mg/day.   Monitoring: Pharmacotherapy: No side-effects or adverse reactions reported. [place] PMP: PDMP reviewed during this encounter.       Compliance: No problems identified. Effectiveness: Clinically acceptable. Plan: Refer to "POC".  Pertinent Labs   SAFETY SCREENING Profile Lab Results  Component Value Date   SARSCOV2NAA NOT DETECTED 08/20/2018   COVIDSOURCE NASOPHARYNGEAL 08/20/2018   STAPHAUREUS NEGATIVE 05/07/2017   MRSAPCR NEGATIVE 05/07/2017   HCVAB (A) 09/29/2009    Reactive (NOTE) Result repeated and verified.  This test is for screening purposes only.  Reactive results should be confirmed by an  alternative method.  Suggest HCV Qualitative, PCR, test code  83130.  Specimens will be stable for reflex testing up to 3 days after collection.   Renal Function Lab Results  Component Value Date   BUN 6 06/03/2018   CREATININE 0.76 06/03/2018   BCR 8 (L) 06/03/2018   GFRAA 128 06/03/2018   GFRNONAA 111 06/03/2018   Hepatic Function Lab Results  Component Value Date   AST 23 06/03/2018   ALT 25 10/17/2017   ALBUMIN 4.6 06/03/2018   UDS Summary  Date Value Ref Range Status  06/03/2018 FINAL  Final    Comment:    ==================================================================== TOXASSURE COMP DRUG ANALYSIS,UR ==================================================================== Test                             Result       Flag       Units Drug Present   Lorazepam                      219                     ng/mg creat    Source of lorazepam is a scheduled prescription medication.   Hydrocodone                    1871                    ng/mg creat   Hydromorphone                  463                     ng/mg creat   Dihydrocodeine                 252                     ng/mg creat   Norhydrocodone                 1617                    ng/mg creat    Sources of hydrocodone include scheduled prescription    medications. Hydromorphone, dihydrocodeine and norhydrocodone are    expected metabolites of hydrocodone. Hydromorphone and    dihydrocodeine are also available as scheduled prescription    medications.   Zolpidem                       PRESENT   Zolpidem Acid                  PRESENT    Zolpidem acid is an expected metabolite of zolpidem.   Sertraline                     PRESENT   Desmethylsertraline            PRESENT    Desmethylsertraline is an expected metabolite of sertraline.   Acetaminophen                  PRESENT ==================================================================== Test                      Result  Flag   Units      Ref Range    Creatinine              52               mg/dL      >=20 ==================================================================== Declared Medications:  Medication list was not provided. ==================================================================== For clinical consultation, please call [phone]. ====================================================================    Note: Above Lab results reviewed.  Recent imaging  DG PAIN CLINIC C-ARM 1-60 MIN NO REPORT Fluoro was used, but no Radiologist interpretation will be provided.  Please refer to "NOTES" tab for provider progress note.  Assessment  The primary encounter diagnosis was Lumbar facet syndrome (Bilateral). Diagnoses of Chronic low back pain (Primary area of Pain) (Bilateral) (R=L) w/o sciatica, DDD (degenerative disc disease), lumbosacral, and Lumbar facet hypertrophy (Bilateral) were also pertinent to this visit.  Plan of Care  I am having Chad E. Wrinkle maintain his lisinopril, atorvastatin, Depo-Testosterone, Vitamin D3, albuterol, levothyroxine, clonazePAM, sildenafil, and oxyCODONE.  Pharmacotherapy (Medications Ordered): No orders of the defined types were placed in this encounter.  Orders:  No orders of the defined types were placed in this encounter.    Follow-up plan:   Return in about 5 weeks (around 10/21/2018) for Procedure (w/ sedation): (B) L-FCT Blk #2, (ASAP) (09/22/2018).    Considering:   Diagnostic bilateral lumbar facet nerve block #2  Possible bilateral lumbar facet RFA    PRN Procedures:   None at this time    Recent Visits Date Type Provider Dept  09/07/18 Office Visit Milinda Pointer, MD [facility]  08/25/18 Procedure visit Milinda Pointer, MD [facility]  08/17/18 Office Visit Milinda Pointer, MD [facility]  07/22/18 Office Visit Milinda Pointer, MD [facility]  06/29/18 Office Visit Milinda Pointer, MD [facility]   Showing recent visits within past 90 days and meeting all other requirements   Today's Visits Date Type Provider Dept  09/16/18 Office Visit Milinda Pointer, MD [facility]  Showing today's visits and meeting all other requirements   Future Appointments Date Type Provider Dept  09/22/18 Appointment Milinda Pointer, MD [facility]  10/14/18 Appointment Milinda Pointer, MD [facility]  Showing future appointments within next 90 days and meeting all other requirements   I discussed the assessment and treatment plan with the patient. The patient was provided an opportunity to ask questions and all were answered. The patient agreed with the plan and demonstrated an understanding of the instructions.  Patient advised to call back or seek an in-person evaluation if the symptoms or condition worsens.  Total duration of non-face-to-face encounter: 10 minutes.  Note by: [name], MD Date: 09/16/2018; Time: 9:53 AM  Note: This dictation was prepared with Dragon dictation. Any transcriptional errors that may result from this process are unintentional.  Disclaimer:  * Given the special circumstances of the COVID-19 pandemic, the federal government has announced that the Office for Civil Rights (OCR) will exercise its enforcement discretion and will not impose penalties on physicians using telehealth in the event of noncompliance with regulatory requirements under the [place] and [place] (HIPAA) in connection with the good faith provision of telehealth during the BJSEG-31 national public health emergency. ([place])

## 2018-09-16 NOTE — Patient Instructions (Signed)

## 2018-09-18 ENCOUNTER — Other Ambulatory Visit: Payer: Self-pay

## 2018-09-18 ENCOUNTER — Other Ambulatory Visit
Admission: RE | Admit: 2018-09-18 | Discharge: 2018-09-18 | Disposition: A | Discharge status: Home / Self Care | Payer: 59 | Source: Ambulatory Visit | Attending: Pain Medicine | Admitting: Pain Medicine

## 2018-09-18 DIAGNOSIS — Z01812 Encounter for preprocedural laboratory examination: Secondary | ICD-10-CM | POA: Insufficient documentation

## 2018-09-18 DIAGNOSIS — Z1159 Encounter for screening for other viral diseases: Secondary | ICD-10-CM | POA: Insufficient documentation

## 2018-09-19 LAB — SARS CORONAVIRUS 2 (TAT 6-24 HRS): SARS Coronavirus 2: NEGATIVE

## 2018-09-22 ENCOUNTER — Ambulatory Visit
Admission: RE | Admit: 2018-09-22 | Discharge: 2018-09-22 | Disposition: A | Discharge status: Home / Self Care | Payer: 59 | Source: Ambulatory Visit | Attending: Pain Medicine | Admitting: Pain Medicine

## 2018-09-22 ENCOUNTER — Ambulatory Visit (HOSPITAL_BASED_OUTPATIENT_CLINIC_OR_DEPARTMENT_OTHER): Payer: 59 | Admitting: Pain Medicine

## 2018-09-22 ENCOUNTER — Encounter: Payer: Self-pay | Admitting: Adult Health

## 2018-09-22 ENCOUNTER — Other Ambulatory Visit: Payer: Self-pay

## 2018-09-22 ENCOUNTER — Encounter: Payer: Self-pay | Admitting: Pain Medicine

## 2018-09-22 VITALS — BP 164/97 | HR 71 | Temp 98.0°F | Resp 20 | Ht 66.0 in | Wt 170.0 lb

## 2018-09-22 DIAGNOSIS — M47816 Spondylosis without myelopathy or radiculopathy, lumbar region: Secondary | ICD-10-CM | POA: Insufficient documentation

## 2018-09-22 DIAGNOSIS — G8929 Other chronic pain: Secondary | ICD-10-CM | POA: Diagnosis not present

## 2018-09-22 DIAGNOSIS — M47817 Spondylosis without myelopathy or radiculopathy, lumbosacral region: Secondary | ICD-10-CM | POA: Insufficient documentation

## 2018-09-22 DIAGNOSIS — M5137 Other intervertebral disc degeneration, lumbosacral region: Secondary | ICD-10-CM | POA: Insufficient documentation

## 2018-09-22 DIAGNOSIS — M545 Low back pain: Secondary | ICD-10-CM | POA: Diagnosis not present

## 2018-09-22 MED ORDER — LIDOCAINE HCL 2 % IJ SOLN
INTRAMUSCULAR | Status: AC
  Filled 2018-09-22: qty 20

## 2018-09-22 MED ORDER — MIDAZOLAM HCL 5 MG/5ML IJ SOLN
INTRAMUSCULAR | Status: AC
  Filled 2018-09-22: qty 5

## 2018-09-22 MED ORDER — LACTATED RINGERS IV SOLN
1000.0000 mL | Freq: Once | INTRAVENOUS | Status: AC
  Administered 2018-09-22: 08:00:00 1000 mL via INTRAVENOUS

## 2018-09-22 MED ORDER — TRIAMCINOLONE ACETONIDE 40 MG/ML IJ SUSP
INTRAMUSCULAR | Status: AC
  Filled 2018-09-22: qty 2

## 2018-09-22 MED ORDER — ROPIVACAINE HCL 2 MG/ML IJ SOLN
18.0000 mL | Freq: Once | INTRAMUSCULAR | Status: AC
  Administered 2018-09-22: 08:00:00 18 mL via PERINEURAL

## 2018-09-22 MED ORDER — TRIAMCINOLONE ACETONIDE 40 MG/ML IJ SUSP
80.0000 mg | Freq: Once | INTRAMUSCULAR | Status: AC
  Administered 2018-09-22: 80 mg

## 2018-09-22 MED ORDER — MIDAZOLAM HCL 5 MG/5ML IJ SOLN
1.0000 mg | INTRAMUSCULAR | Status: DC | PRN
  Administered 2018-09-22: 2 mg via INTRAVENOUS

## 2018-09-22 MED ORDER — ROPIVACAINE HCL 2 MG/ML IJ SOLN
INTRAMUSCULAR | Status: AC
  Filled 2018-09-22: qty 20

## 2018-09-22 MED ORDER — FENTANYL CITRATE (PF) 100 MCG/2ML IJ SOLN
INTRAMUSCULAR | Status: AC
  Filled 2018-09-22: qty 2

## 2018-09-22 MED ORDER — FENTANYL CITRATE (PF) 100 MCG/2ML IJ SOLN
25.0000 ug | INTRAMUSCULAR | Status: DC | PRN
  Administered 2018-09-22: 50 ug via INTRAVENOUS

## 2018-09-22 MED ORDER — LIDOCAINE HCL 2 % IJ SOLN
20.0000 mL | Freq: Once | INTRAMUSCULAR | Status: AC
  Administered 2018-09-22: 08:00:00 400 mg

## 2018-09-22 NOTE — Progress Notes (Signed)
Patient's Name: Chad Wilson  MRN: [id number]  Referring Provider: Dorothyann Peng, NP  DOB: 1975/08/24  PCP: Dorothyann Peng, NP  DOS: 09/22/2018  Note by: [name], MD  Service setting: Ambulatory outpatient  Specialty: Interventional Pain Management  Patient type: Established  Location: [facility] (AMB) Pain Management Facility  Visit type: Interventional Procedure   Primary Reason for Visit: Interventional Pain Management Treatment. CC: Back Pain (low)  Procedure:          Anesthesia, Analgesia, Anxiolysis:  Type: Lumbar Facet, Medial Branch Block(s) #2  Primary Purpose: Diagnostic Region: Posterolateral Lumbosacral Spine Level: L2, L3, L4, L5, & S1 Medial Branch Level(s). Injecting these levels blocks the L3-4, L4-5, and L5-S1 lumbar facet joints. Laterality: Bilateral  Type: Moderate (Conscious) Sedation combined with Local Anesthesia Indication(s): Analgesia and Anxiety Route: Intravenous (IV) IV Access: Secured Sedation: Meaningful verbal contact was maintained at all times during the procedure  Local Anesthetic: Lidocaine 1-2%  Position: Prone   Indications: 1. Lumbar facet syndrome (Bilateral)   2. Lumbar facet hypertrophy (Bilateral)   3. Spondylosis without myelopathy or radiculopathy, lumbosacral region   4. DDD (degenerative disc disease), lumbosacral   5. Chronic low back pain (Primary area of Pain) (Bilateral) (R=L) w/o sciatica    Pain Score: Pre-procedure: 7 /10 Post-procedure: 0-No pain/10  Pre-op Assessment:  [name] is a 43 y.o. (year old), male patient, seen today for interventional treatment. He  has a past surgical history that includes Multiple tooth extractions (15 yrs. ago) and Anterior cervical decomp/discectomy fusion (N/A, 05/21/2017). Mr. Shabazz has a current medication list which includes the following prescription(s): albuterol, atorvastatin, vitamin d3, clonazepam, depo-testosterone, levothyroxine, lisinopril, oxycodone, sildenafil, and  omeprazole, and the following Facility-Administered Medications: fentanyl and midazolam. His primarily concern today is the Back Pain (low)  Initial Vital Signs:  Pulse/HCG Rate: 70  Temp: 98.1 F (36.7 C) Resp: 18 BP: (!) 165/110(Md aware of BP and talked with patient) SpO2: 98 %  BMI: Estimated body mass index is 27.44 kg/m as calculated from the following:   Height as of this encounter: 5' 6"  (1.676 m).   Weight as of this encounter: 170 lb (77.1 kg).  Risk Assessment: Allergies: Reviewed. He is allergic to chantix [varenicline].  Allergy Precautions: None required Coagulopathies: Reviewed. None identified.  Blood-thinner therapy: None at this time Active Infection(s): Reviewed. None identified. [name] is afebrile  Site Confirmation: Mr. Bartoletti was asked to confirm the procedure and laterality before marking the site Procedure checklist: Completed Consent: Before the procedure and under the influence of no sedative(s), amnesic(s), or anxiolytics, the patient was informed of the treatment options, risks and possible complications. To fulfill our ethical and legal obligations, as recommended by the American Medical Association's Code of Ethics, I have informed the patient of my clinical impression; the nature and purpose of the treatment or procedure; the risks, benefits, and possible complications of the intervention; the alternatives, including doing nothing; the risk(s) and benefit(s) of the alternative treatment(s) or procedure(s); and the risk(s) and benefit(s) of doing nothing. The patient was provided information about the general risks and possible complications associated with the procedure. These may include, but are not limited to: failure to achieve desired goals, infection, bleeding, organ or nerve damage, allergic reactions, paralysis, and death. In addition, the patient was informed of those risks and complications associated to Spine-related procedures, such as failure to  decrease pain; infection (i.e.: Meningitis, epidural or intraspinal abscess); bleeding (i.e.: epidural hematoma, subarachnoid hemorrhage, or any other type  of intraspinal or peri-dural bleeding); organ or nerve damage (i.e.: Any type of peripheral nerve, nerve root, or spinal cord injury) with subsequent damage to sensory, motor, and/or autonomic systems, resulting in permanent pain, numbness, and/or weakness of one or several areas of the body; allergic reactions; (i.e.: anaphylactic reaction); and/or death. Furthermore, the patient was informed of those risks and complications associated with the medications. These include, but are not limited to: allergic reactions (i.e.: anaphylactic or anaphylactoid reaction(s)); adrenal axis suppression; blood sugar elevation that in diabetics may result in ketoacidosis or comma; water retention that in patients with history of congestive heart failure may result in shortness of breath, pulmonary edema, and decompensation with resultant heart failure; weight gain; swelling or edema; medication-induced neural toxicity; particulate matter embolism and blood vessel occlusion with resultant organ, and/or nervous system infarction; and/or aseptic necrosis of one or more joints. Finally, the patient was informed that Medicine is not an exact science; therefore, there is also the possibility of unforeseen or unpredictable risks and/or possible complications that may result in a catastrophic outcome. The patient indicated having understood very clearly. We have given the patient no guarantees and we have made no promises. Enough time was given to the patient to ask questions, all of which were answered to the patient's satisfaction. Mr. Abarca has indicated that he wanted to continue with the procedure. Attestation: I, the ordering provider, attest that I have discussed with the patient the benefits, risks, side-effects, alternatives, likelihood of achieving goals, and potential  problems during recovery for the procedure that I have provided informed consent. Date   Time: 09/22/2018  8:08 AM  Pre-Procedure Preparation:  Monitoring: As per clinic protocol. Respiration, ETCO2, SpO2, BP, heart rate and rhythm monitor placed and checked for adequate function Safety Precautions: Patient was assessed for positional comfort and pressure points before starting the procedure. Time-out: I initiated and conducted the "Time-out" before starting the procedure, as per protocol. The patient was asked to participate by confirming the accuracy of the "Time Out" information. Verification of the correct person, site, and procedure were performed and confirmed by me, the nursing staff, and the patient. "Time-out" conducted as per Joint Commission's Universal Protocol (UP.01.01.01). Time: 0848  Description of Procedure:          Laterality: Bilateral. The procedure was performed in identical fashion on both sides. Levels:  L2, L3, L4, L5, & S1 Medial Branch Level(s) Area Prepped: Posterior Lumbosacral Region Prepping solution: DuraPrep (Iodine Povacrylex [0.7% available iodine] and Isopropyl Alcohol, 74% w/w) Safety Precautions: Aspiration looking for blood return was conducted prior to all injections. At no point did we inject any substances, as a needle was being advanced. Before injecting, the patient was told to immediately notify me if he was experiencing any new onset of "ringing in the ears, or metallic taste in the mouth". No attempts were made at seeking any paresthesias. Safe injection practices and needle disposal techniques used. Medications properly checked for expiration dates. SDV (single dose vial) medications used. After the completion of the procedure, all disposable equipment used was discarded in the proper designated medical waste containers. Local Anesthesia: Protocol guidelines were followed. The patient was positioned over the fluoroscopy table. The area was prepped in the  usual manner. The time-out was completed. The target area was identified using fluoroscopy. A 12-in long, straight, sterile hemostat was used with fluoroscopic guidance to locate the targets for each level blocked. Once located, the skin was marked with an approved surgical skin marker. Once  all sites were marked, the skin (epidermis, dermis, and hypodermis), as well as deeper tissues (fat, connective tissue and muscle) were infiltrated with a small amount of a short-acting local anesthetic, loaded on a 10cc syringe with a 25G, 1.5-in  Needle. An appropriate amount of time was allowed for local anesthetics to take effect before proceeding to the next step. Local Anesthetic: Lidocaine 2.0% The unused portion of the local anesthetic was discarded in the proper designated containers. Technical explanation of process:  L2 Medial Branch Nerve Block (MBB): The target area for the L2 medial branch is at the junction of the postero-lateral aspect of the superior articular process and the superior, posterior, and medial edge of the transverse process of L3. Under fluoroscopic guidance, a Quincke needle was inserted until contact was made with os over the superior postero-lateral aspect of the pedicular shadow (target area). After negative aspiration for blood, 0.5 mL of the nerve block solution was injected without difficulty or complication. The needle was removed intact. L3 Medial Branch Nerve Block (MBB): The target area for the L3 medial branch is at the junction of the postero-lateral aspect of the superior articular process and the superior, posterior, and medial edge of the transverse process of L4. Under fluoroscopic guidance, a Quincke needle was inserted until contact was made with os over the superior postero-lateral aspect of the pedicular shadow (target area). After negative aspiration for blood, 0.5 mL of the nerve block solution was injected without difficulty or complication. The needle was removed  intact. L4 Medial Branch Nerve Block (MBB): The target area for the L4 medial branch is at the junction of the postero-lateral aspect of the superior articular process and the superior, posterior, and medial edge of the transverse process of L5. Under fluoroscopic guidance, a Quincke needle was inserted until contact was made with os over the superior postero-lateral aspect of the pedicular shadow (target area). After negative aspiration for blood, 0.5 mL of the nerve block solution was injected without difficulty or complication. The needle was removed intact. L5 Medial Branch Nerve Block (MBB): The target area for the L5 medial branch is at the junction of the postero-lateral aspect of the superior articular process and the superior, posterior, and medial edge of the sacral ala. Under fluoroscopic guidance, a Quincke needle was inserted until contact was made with os over the superior postero-lateral aspect of the pedicular shadow (target area). After negative aspiration for blood, 0.5 mL of the nerve block solution was injected without difficulty or complication. The needle was removed intact. S1 Medial Branch Nerve Block (MBB): The target area for the S1 medial branch is at the posterior and inferior 6 o'clock position of the L5-S1 facet joint. Under fluoroscopic guidance, the Quincke needle inserted for the L5 MBB was redirected until contact was made with os over the inferior and postero aspect of the sacrum, at the 6 o' clock position under the L5-S1 facet joint (Target area). After negative aspiration for blood, 0.5 mL of the nerve block solution was injected without difficulty or complication. The needle was removed intact.  Nerve block solution: 0.2% PF-Ropivacaine + Triamcinolone (40 mg/mL) diluted to a final concentration of 4 mg of Triamcinolone/mL of Ropivacaine The unused portion of the solution was discarded in the proper designated containers. Procedural Needles: 22-gauge, 3.5-inch, Quincke  needles used for all levels.  Once the entire procedure was completed, the treated area was cleaned, making sure to leave some of the prepping solution back to take advantage of its  long term bactericidal properties.   Illustration of the posterior view of the lumbar spine and the posterior neural structures. Laminae of L2 through S1 are labeled. DPRL5, dorsal primary ramus of L5; DPRS1, dorsal primary ramus of S1; DPR3, dorsal primary ramus of L3; FJ, facet (zygapophyseal) joint L3-L4; I, inferior articular process of L4; LB1, lateral branch of dorsal primary ramus of L1; IAB, inferior articular branches from L3 medial branch (supplies L4-L5 facet joint); IBP, intermediate branch plexus; MB3, medial branch of dorsal primary ramus of L3; NR3, third lumbar nerve root; S, superior articular process of L5; SAB, superior articular branches from L4 (supplies L4-5 facet joint also); TP3, transverse process of L3.  Vitals:   09/22/18 0900 09/22/18 0910 09/22/18 0920 09/22/18 0930  BP: (!) 176/101 (!) 159/101 (!) 150/97 (!) 164/97  Pulse: 80 74 71 71  Resp: 15 20 16 20   Temp:  (!) 97 F (36.1 C)  98 F (36.7 C)  SpO2: 96% 97% 97% 97%  Weight:      Height:         Start Time: 0848 hrs. End Time: 0858 hrs.  Imaging Guidance (Spinal):          Type of Imaging Technique: Fluoroscopy Guidance (Spinal) Indication(s): Assistance in needle guidance and placement for procedures requiring needle placement in or near specific anatomical locations not easily accessible without such assistance. Exposure Time: Please see nurses notes. Contrast: None used. Fluoroscopic Guidance: I was personally present during the use of fluoroscopy. "Tunnel Vision Technique" used to obtain the best possible view of the target area. Parallax error corrected before commencing the procedure. "Direction-depth-direction" technique used to introduce the needle under continuous pulsed fluoroscopy. Once target was reached,  antero-posterior, oblique, and lateral fluoroscopic projection used confirm needle placement in all planes. Images permanently stored in EMR. Interpretation: No contrast injected. I personally interpreted the imaging intraoperatively. Adequate needle placement confirmed in multiple planes. Permanent images saved into the patient's record.  Antibiotic Prophylaxis:   Anti-infectives (From admission, onward)   None     Indication(s): None identified  Post-operative Assessment:  Post-procedure Vital Signs:  Pulse/HCG Rate: 71  Temp: 98 F (36.7 C) Resp: 20 BP: (!) 164/97 SpO2: 97 %  EBL: None  Complications: No immediate post-treatment complications observed by team, or reported by patient.  Note: The patient tolerated the entire procedure well. A repeat set of vitals were taken after the procedure and the patient was kept under observation following institutional policy, for this type of procedure. Post-procedural neurological assessment was performed, showing return to baseline, prior to discharge. The patient was provided with post-procedure discharge instructions, including a section on how to identify potential problems. Should any problems arise concerning this procedure, the patient was given instructions to immediately contact us, at any time, without hesitation. In any case, we plan to contact the patient by telephone for a follow-up status report regarding this interventional procedure.  Comments:  No additional relevant information.  Plan of Care  Orders:  Orders Placed This Encounter  Procedures   LUMBAR FACET(MEDIAL BRANCH NERVE BLOCK) MBNB    Scheduling Instructions:     Side: Bilateral     Level: L3-4, L4-5, & L5-S1 Facets (L2, L3, L4, L5, & S1 Medial Branch Nerves)     Sedation: With Sedation.     Timeframe: Today    Order Specific Question:   Where will this procedure be performed?    Answer:   [facility]   [place]  C-ARM 1-60 MIN NO REPORT     Intraoperative interpretation by procedural physician at [place].    Standing Status:   Standing    Number of Occurrences:   1    Order Specific Question:   Reason for exam:    Answer:   Assistance in needle guidance and placement for procedures requiring needle placement in or near specific anatomical locations not easily accessible without such assistance.   Provider attestation of informed consent for procedure/surgical case    I, the ordering provider, attest that I have discussed with the patient the benefits, risks, side effects, alternatives, likelihood of achieving goals and potential problems during recovery for the procedure that I have provided informed consent.    Standing Status:   Standing    Number of Occurrences:   1   Informed Consent Details: Transcribe to consent form and obtain patient signature    Standing Status:   Standing    Number of Occurrences:   1    Order Specific Question:   Procedure    Answer:   Bilateral Lumbar facet block (medial branch block) under fluoroscopic guidance. (See notes for levels.)    Order Specific Question:   Surgeon    Answer:   Ayonna Speranza A. Dossie Arbour, MD    Order Specific Question:   Indication/Reason    Answer:   Bilateral low back pain with or without lower extremity pain   Chronic Opioid Analgesic:  Oxycodone IR 5 mg every 6 hours (20 mg/day of oxycodone) MME/day: 30 mg/day.    Medications ordered for procedure: Meds ordered this encounter  Medications   lidocaine (XYLOCAINE) 2 % (with pres) injection 400 mg   lactated ringers infusion 1,000 mL   midazolam (VERSED) 5 MG/5ML injection 1-2 mg    Make sure Flumazenil is available in the pyxis when using this medication. If oversedation occurs, administer 0.2 mg IV over 15 sec. If after 45 sec no response, administer 0.2 mg again over 1 min; may repeat at 1 min intervals; not to exceed 4 doses (1 mg)   fentaNYL (SUBLIMAZE) injection 25-50 mcg    Make sure Narcan is  available in the pyxis when using this medication. In the event of respiratory depression (RR< 8/min): Titrate NARCAN (naloxone) in increments of 0.1 to 0.2 mg IV at 2-3 minute intervals, until desired degree of reversal.   ropivacaine (PF) 2 mg/mL (0.2%) (NAROPIN) injection 18 mL   triamcinolone acetonide (KENALOG-40) injection 80 mg   Medications administered: We administered lidocaine, lactated ringers, midazolam, fentaNYL, ropivacaine (PF) 2 mg/mL (0.2%), and triamcinolone acetonide.  See the medical record for exact dosing, route, and time of administration.  Follow-up plan:   Return in about 2 weeks (around 10/06/2018) for (VV), E/M (PP).       Considering:   Diagnostic bilateral lumbar facet nerve block #2  Possible bilateral lumbar facet RFA    PRN Procedures:   None at this time    Recent Visits Date Type Provider Dept  09/16/18 Office Visit Milinda Pointer, MD [facility]  09/07/18 Office Visit Milinda Pointer, MD [facility]  08/25/18 Procedure visit Milinda Pointer, MD [facility]  08/17/18 Office Visit Milinda Pointer, MD [facility]  07/22/18 Office Visit Milinda Pointer, MD [facility]  06/29/18 Office Visit Milinda Pointer, MD [facility]  Showing recent visits within past 90 days and meeting all other requirements   Today's Visits Date Type Provider Dept  09/22/18 Procedure visit  Milinda Pointer, MD [facility]  Showing today's visits and meeting all other requirements   Future Appointments Date Type Provider Dept  10/21/18 Appointment Milinda Pointer, MD [facility]  Showing future appointments within next 90 days and meeting all other requirements    Disposition: Discharge home  Discharge Date & Time: 09/22/2018; 0935 hrs.   Primary Care Physician: Dorothyann Peng, NP Location: [facility] Note by: [name],  MD Date: 09/22/2018; Time: 10:00 AM  Disclaimer:  Medicine is not an Chief Strategy Officer. The only guarantee in medicine is that nothing is guaranteed. It is important to note that the decision to proceed with this intervention was based on the information collected from the patient. The Data and conclusions were drawn from the patient's questionnaire, the interview, and the physical examination. Because the information was provided in large part by the patient, it cannot be guaranteed that it has not been purposely or unconsciously manipulated. Every effort has been made to obtain as much relevant data as possible for this evaluation. It is important to note that the conclusions that lead to this procedure are derived in large part from the available data. Always take into account that the treatment will also be dependent on availability of resources and existing treatment guidelines, considered by other Pain Management Practitioners as being common knowledge and practice, at the time of the intervention. For Medico-Legal purposes, it is also important to point out that variation in procedural techniques and pharmacological choices are the acceptable norm. The indications, contraindications, technique, and results of the above procedure should only be interpreted and judged by a Board-Certified Interventional Pain Specialist with extensive familiarity and expertise in the same exact procedure and technique.

## 2018-09-22 NOTE — Patient Instructions (Addendum)
____________________________________________________________________________________________  Post-Procedure Discharge Instructions  Instructions:  Apply ice:   Purpose: This will minimize any swelling and discomfort after procedure.   When: Day of procedure, as soon as you get home.  How: Fill a plastic sandwich bag with crushed ice. Cover it with a small towel and apply to injection site.  How long: (15 min on, 15 min off) Apply for 15 minutes then remove x 15 minutes.  Repeat sequence on day of procedure, until you go to bed.  Apply heat:   Purpose: To treat any soreness and discomfort from the procedure.  When: Starting the next day after the procedure.  How: Apply heat to procedure site starting the day following the procedure.  How long: May continue to repeat daily, until discomfort goes away.  Food intake: Start with clear liquids (like water) and advance to regular food, as tolerated.   Physical activities: Keep activities to a minimum for the first 8 hours after the procedure. After that, then as tolerated.  Driving: If you have received any sedation, be responsible and do not drive. You are not allowed to drive for 24 hours after having sedation.  Blood thinner: (Applies only to those taking blood thinners) You may restart your blood thinner 6 hours after your procedure.  Insulin: (Applies only to Diabetic patients taking insulin) As soon as you can eat, you may resume your normal dosing schedule.  Infection prevention: Keep procedure site clean and dry. Shower daily and clean area with soap and water.  Post-procedure Pain Diary: Extremely important that this be done correctly and accurately. Recorded information will be used to determine the next step in treatment. For the purpose of accuracy, follow these rules:  Evaluate only the area treated. Do not report or include pain from an untreated area. For the purpose of this evaluation, ignore all other areas of pain,  except for the treated area.  After your procedure, avoid taking a long nap and attempting to complete the pain diary after you wake up. Instead, set your alarm clock to go off every hour, on the hour, for the initial 8 hours after the procedure. Document the duration of the numbing medicine, and the relief you are getting from it.  Do not go to sleep and attempt to complete it later. It will not be accurate. If you received sedation, it is likely that you were given a medication that may cause amnesia. Because of this, completing the diary at a later time may cause the information to be inaccurate. This information is needed to plan your care.  Follow-up appointment: Keep your post-procedure follow-up evaluation appointment after the procedure (usually 2 weeks for most procedures, 6 weeks for radiofrequencies). DO NOT FORGET to bring you pain diary with you.   Expect: (What should I expect to see with my procedure?)  From numbing medicine (AKA: Local Anesthetics): Numbness or decrease in pain. You may also experience some weakness, which if present, could last for the duration of the local anesthetic.  Onset: Full effect within 15 minutes of injected.  Duration: It will depend on the type of local anesthetic used. On the average, 1 to 8 hours.   From steroids (Applies only if steroids were used): Decrease in swelling or inflammation. Once inflammation is improved, relief of the pain will follow.  Onset of benefits: Depends on the amount of swelling present. The more swelling, the longer it will take for the benefits to be seen. In some cases, up to 10 days.  Duration: Steroids will stay in the system x 2 weeks. Duration of benefits will depend on multiple posibilities including persistent irritating factors.  Side-effects: If present, they may typically last 2 weeks (the duration of the steroids).  Frequent: Cramps (if they occur, drink Gatorade and take over-the-counter Magnesium 450-500 mg  once to twice a day); water retention with temporary weight gain; increases in blood sugar; decreased immune system response; increased appetite.  Occasional: Facial flushing (red, warm cheeks); mood swings; menstrual changes.  Uncommon: Long-term decrease or suppression of natural hormones; bone thinning. (These are more common with higher doses or more frequent use. This is why we prefer that our patients avoid having any injection therapies in other practices.)   Very Rare: Severe mood changes; psychosis; aseptic necrosis.  From procedure: Some discomfort is to be expected once the numbing medicine wears off. This should be minimal if ice and heat are applied as instructed.  Call if: (When should I call?)  You experience numbness and weakness that gets worse with time, as opposed to wearing off.  New onset bowel or bladder incontinence. (Applies only to procedures done in the spine)  Emergency Numbers:  Durning business hours (Monday - Thursday, 8:00 AM - 4:00 PM) (Friday, 9:00 AM - 12:00 Noon): [phone]  After hours: [phone]  NOTE: If you are having a problem and are unable connect with, or to talk to a provider, then go to your nearest urgent care or emergency department. If the problem is serious and urgent, please call 911. ____________________________________________________________________________________________   ____________________________________________________________________________________________  Medication Rules  Purpose: To inform patients, and their family members, of our rules and regulations.  Applies to: All patients receiving prescriptions (written or electronic).  Pharmacy of record: Pharmacy where electronic prescriptions will be sent. If written prescriptions are taken to a different pharmacy, please inform the nursing staff. The pharmacy listed in the electronic medical record should be the one where you would like electronic prescriptions  to be sent.  Electronic prescriptions: In compliance with the [place] (STOP) Act of 2017 (Session Lanny Cramp [phone]), effective March 18, 2018, all controlled substances must be electronically prescribed. Calling prescriptions to the pharmacy will cease to exist.  Prescription refills: Only during scheduled appointments. Applies to all prescriptions.  NOTE: The following applies primarily to controlled substances (Opioid* Pain Medications).   Patient's responsibilities: 1. Pain Pills: Bring all pain pills to every appointment (except for procedure appointments). 2. Pill Bottles: Bring pills in original pharmacy bottle. Always bring the newest bottle. Bring bottle, even if empty. 3. Medication refills: You are responsible for knowing and keeping track of what medications you take and those you need refilled. The day before your appointment: write a list of all prescriptions that need to be refilled. The day of the appointment: give the list to the admitting nurse. Prescriptions will be written only during appointments. No prescriptions will be written on procedure days. If you forget a medication: it will not be "Called in", "Faxed", or "electronically sent". You will need to get another appointment to get these prescribed. No early refills. Do not call asking to have your prescription filled early. 4. Prescription Accuracy: You are responsible for carefully inspecting your prescriptions before leaving our office. Have the discharge nurse carefully go over each prescription with you, before taking them home. Make sure that your name is accurately spelled, that your address is correct. Check the name and dose of your medication to make sure it  is accurate. Check the number of pills, and the written instructions to make sure they are clear and accurate. Make sure that you are given enough medication to last until your next medication refill  appointment. 5. Taking Medication: Take medication as prescribed. When it comes to controlled substances, taking less pills or less frequently than prescribed is permitted and encouraged. Never take more pills than instructed. Never take medication more frequently than prescribed.  6. Inform other Doctors: Always inform, all of your healthcare providers, of all the medications you take. 7. Pain Medication from other Providers: You are not allowed to accept any additional pain medication from any other Doctor or Healthcare provider. There are two exceptions to this rule. (see below) In the event that you require additional pain medication, you are responsible for notifying us, as stated below. 8. Medication Agreement: You are responsible for carefully reading and following our Medication Agreement. This must be signed before receiving any prescriptions from our practice. Safely store a copy of your signed Agreement. Violations to the Agreement will result in no further prescriptions. (Additional copies of our Medication Agreement are available upon request.) 9. Laws, Rules, & Regulations: All patients are expected to follow all Federal and [employer], [employer], Rules, [employer] [facility]. Ignorance of the Laws does not constitute a valid excuse. The use of any illegal substances is prohibited. 10. Adopted CDC guidelines & recommendations: Target dosing levels will be at or below 60 MME/day. Use of benzodiazepines** is not recommended.  Exceptions: There are only two exceptions to the rule of not receiving pain medications from other Healthcare Providers. 1. Exception #1 (Emergencies): In the event of an emergency (i.e.: accident requiring emergency care), you are allowed to receive additional pain medication. However, you are responsible for: As soon as you are able, call our office [phone], at any time of the day or night, and leave a message stating your name, the date and nature of the emergency, and  the name and dose of the medication prescribed. In the event that your call is answered by a member of our staff, make sure to document and save the date, time, and the name of the person that took your information.  2. Exception #2 (Planned Surgery): In the event that you are scheduled by another doctor or dentist to have any type of surgery or procedure, you are allowed (for a period no longer than 30 days), to receive additional pain medication, for the acute post-op pain. However, in this case, you are responsible for picking up a copy of our "Post-op Pain Management for Surgeons" handout, and giving it to your surgeon or dentist. This document is available at our office, and does not require an appointment to obtain it. Simply go to our office during business hours (Monday-Thursday from 8:00 AM to 4:00 PM) (Friday 8:00 AM to 12:00 Noon) or if you have a scheduled appointment with [place], prior to your surgery, and ask for it by name. In addition, you will need to provide [place] with your name, name of your surgeon, type of surgery, and date of procedure or surgery.  *Opioid medications include: morphine, codeine, oxycodone, oxymorphone, hydrocodone, hydromorphone, meperidine, tramadol, tapentadol, buprenorphine, fentanyl, methadone. **Benzodiazepine medications include: diazepam (Valium), alprazolam (Xanax), clonazepam (Klonopine), lorazepam (Ativan), clorazepate (Tranxene), chlordiazepoxide (Librium), estazolam (Prosom), oxazepam (Serax), temazepam (Restoril), triazolam (Halcion) (Last updated: 05/15/2017) ____________________________________________________________________________________________   ____________________________________________________________________________________________  Medication Recommendations and Reminders  Applies to: All patients receiving prescriptions (written and/or electronic).  Medication Rules & Regulations: These  rules and regulations exist for your safety and that of  others. They are not flexible and neither are we. Dismissing or ignoring them will be considered "non-compliance" with medication therapy, resulting in complete and irreversible termination of such therapy. (See document titled "Medication Rules" for more details.) In all conscience, because of safety reasons, we cannot continue providing a therapy where the patient does not follow instructions.  Pharmacy of record:   Definition: This is the pharmacy where your electronic prescriptions will be sent.   We do not endorse any particular pharmacy.  You are not restricted in your choice of pharmacy.  The pharmacy listed in the electronic medical record should be the one where you want electronic prescriptions to be sent.  If you choose to change pharmacy, simply notify our nursing staff of your choice of new pharmacy.  Recommendations:  Keep all of your pain medications in a safe place, under lock and key, even if you live alone.   After you fill your prescription, take 1 week's worth of pills and put them away in a safe place. You should keep a separate, properly labeled bottle for this purpose. The remainder should be kept in the original bottle. Use this as your primary supply, until it runs out. Once it's gone, then you know that you have 1 week's worth of medicine, and it is time to come in for a prescription refill. If you do this correctly, it is unlikely that you will ever run out of medicine.  To make sure that the above recommendation works, it is very important that you make sure your medication refill appointments are scheduled at least 1 week before you run out of medicine. To do this in an effective manner, make sure that you do not leave the office without scheduling your next medication management appointment. Always ask the nursing staff to show you in your prescription , when your medication will be running out. Then arrange for the receptionist to get you a return appointment, at  least 7 days before you run out of medicine. Do not wait until you have 1 or 2 pills left, to come in. This is very poor planning and does not take into consideration that we may need to cancel appointments due to bad weather, sickness, or emergencies affecting our staff.  "Partial Fill": If for any reason your pharmacy does not have enough pills/tablets to completely fill or refill your prescription, do not allow for a "partial fill". You will need a separate prescription to fill the remaining amount, which we will not provide. If the reason for the partial fill is your insurance, you will need to talk to the pharmacist about payment alternatives for the remaining tablets, but again, do not accept a partial fill.  Prescription refills and/or changes in medication(s):   Prescription refills, and/or changes in dose or medication, will be conducted only during scheduled medication management appointments. (Applies to both, written and electronic prescriptions.)  No refills on procedure days. No medication will be changed or started on procedure days. No changes, adjustments, and/or refills will be conducted on a procedure day. Doing so will interfere with the diagnostic portion of the procedure.  No phone refills. No medications will be "called into the pharmacy".  No Fax refills.  No weekend refills.  No Holliday refills.  No after hours refills.  Remember:  Business hours are:  Monday to Thursday 8:00 AM to 4:00 PM Provider's Schedule: Dionisio David, NP - Appointments are:  Medication  management: Monday to Thursday 8:00 AM to 4:00 PM Milinda Pointer, MD - Appointments are:  Medication management: Monday and Wednesday 8:00 AM to 4:00 PM Procedure day: Tuesday and Thursday 7:30 AM to 4:00 PM Gillis Santa, MD - Appointments are:  Medication management: Tuesday and Thursday 8:00 AM to 4:00 PM Procedure day: Monday and Wednesday 7:30 AM to 4:00 PM (Last update:  05/15/2017) ____________________________________________________________________________________________   ____________________________________________________________________________________________  CANNABIDIOL (AKA: CBD Oil or Pills)  Applies to: All patients receiving prescriptions of controlled substances (written and/or electronic).  General Information: Cannabidiol (CBD) was discovered in 3. It is one of some 113 identified cannabinoids in cannabis (Marijuana) plants, accounting for up to 40% of the plant's extract. As of 2018, preliminary clinical research on cannabidiol included studies of anxiety, cognition, movement disorders, and pain.  Cannabidiol is consummed in multiple ways, including inhalation of cannabis smoke or vapor, as an aerosol spray into the cheek, and by mouth. It may be supplied as CBD oil containing CBD as the active ingredient (no added tetrahydrocannabinol (THC) or terpenes), a full-plant CBD-dominant hemp extract oil, capsules, dried cannabis, or as a liquid solution. CBD is thought not have the same psychoactivity as THC, and may affect the actions of THC. Studies suggest that CBD may interact with different biological targets, including cannabinoid receptors and other neurotransmitter receptors. As of 2018 the mechanism of action for its biological effects has not been determined.  In the [place], cannabidiol has a limited approval by the Food and Drug Administration (FDA) for treatment of only two types of epilepsy disorders. The side effects of long-term use of the drug include somnolence, decreased appetite, diarrhea, fatigue, malaise, weakness, sleeping problems, and others.  CBD remains a Schedule I drug prohibited for any use.  Legality: Some manufacturers ship CBD products nationally, an illegal action which the FDA has not enforced in 2018, with CBD remaining the subject of an FDA investigational new drug evaluation, and is not considered legal as  a dietary supplement or food ingredient as of December 2018. Federal illegality has made it difficult historically to conduct research on CBD. CBD is openly sold in head shops and health food stores in some states where such sales have not been explicitly legalized.  Warning: Because it is not FDA approved for general use or treatment of pain, it is not required to undergo the same manufacturing controls as prescription drugs.  This means that the available cannabidiol (CBD) may be contaminated with THC.  If this is the case, it will trigger a positive urine drug screen (UDS) test for cannabinoids (Marijuana).  Because a positive UDS for illicit substances is a violation of our medication agreement, your opioid analgesics (pain medicine) may be permanently discontinued. (Last update: 06/05/2017) ____________________________________________________________________________________________    Coping with Quitting Smoking  Quitting smoking is a physical and mental challenge. You will face cravings, withdrawal symptoms, and temptation. Before quitting, work with your health care provider to make a plan that can help you cope. Preparation can help you quit and keep you from giving in. How can I cope with cravings? Cravings usually last for 5-10 minutes. If you get through it, the craving will pass. Consider taking the following actions to help you cope with cravings:  Keep your mouth busy: ? Chew sugar-free gum. ? Suck on hard candies or a straw. ? Brush your teeth.  Keep your hands and body busy: ? Immediately change to a different activity when you feel a craving. ? Squeeze or play  with a ball. ? Do an activity or a hobby, like making bead jewelry, practicing needlepoint, or working with wood. ? Mix up your normal routine. ? Take a short exercise break. Go for a quick walk or run up and down stairs. ? Spend time in public places where smoking is not allowed.  Focus on doing something kind or  helpful for someone else.  Call a friend or family member to talk during a craving.  Join a support group.  Call a quit line, such as 1-800-QUIT-NOW.  Talk with your health care provider about medicines that might help you cope with cravings and make quitting easier for you. How can I deal with withdrawal symptoms? Your body may experience negative effects as it tries to get used to not having nicotine in the system. These effects are called withdrawal symptoms. They may include:  Feeling hungrier than normal.  Trouble concentrating.  Irritability.  Trouble sleeping.  Feeling depressed.  Restlessness and agitation.  Craving a cigarette. To manage withdrawal symptoms:  Avoid places, people, and activities that trigger your cravings.  Remember why you want to quit.  Get plenty of sleep.  Avoid coffee and other caffeinated drinks. These may worsen some of your symptoms. How can I handle social situations? Social situations can be difficult when you are quitting smoking, especially in the first few weeks. To manage this, you can:  Avoid parties, bars, and other social situations where people might be smoking.  Avoid alcohol.  Leave right away if you have the urge to smoke.  Explain to your family and friends that you are quitting smoking. Ask for understanding and support.  Plan activities with friends or family where smoking is not an option. What are some ways I can cope with stress? Wanting to smoke may cause stress, and stress can make you want to smoke. Find ways to manage your stress. Relaxation techniques can help. For example:  Breathe slowly and deeply, in through your nose and out through your mouth.  Listen to soothing, relaxing music.  Talk with a family member or friend about your stress.  Light a candle.  Soak in a bath or take a shower.  Think about a peaceful place. What are some ways I can prevent weight gain? Be aware that many people gain  weight after they quit smoking. However, not everyone does. To keep from gaining weight, have a plan in place before you quit and stick to the plan after you quit. Your plan should include:  Having healthy snacks. When you have a craving, it may help to: ? Eat plain popcorn, crunchy carrots, celery, or other cut vegetables. ? Chew sugar-free gum.  Changing how you eat: ? Eat small portion sizes at meals. ? Eat 4-6 small meals throughout the day instead of 1-2 large meals a day. ? Be mindful when you eat. Do not watch television or do other things that might distract you as you eat.  Exercising regularly: ? Make time to exercise each day. If you do not have time for a long workout, do short bouts of exercise for 5-10 minutes several times a day. ? Do some form of strengthening exercise, like weight lifting, and some form of aerobic exercise, like running or swimming.  Drinking plenty of water or other low-calorie or no-calorie drinks. Drink 6-8 glasses of water daily, or as much as instructed by your health care provider. Summary  Quitting smoking is a physical and mental challenge. You will face cravings,  withdrawal symptoms, and temptation to smoke again. Preparation can help you as you go through these challenges.  You can cope with cravings by keeping your mouth busy (such as by chewing gum), keeping your body and hands busy, and making calls to family, friends, or a helpline for people who want to quit smoking.  You can cope with withdrawal symptoms by avoiding places where people smoke, avoiding drinks with caffeine, and getting plenty of rest.  Ask your health care provider about the different ways to prevent weight gain, avoid stress, and handle social situations. This information is not intended to replace advice given to you by your health care provider. Make sure you discuss any questions you have with your health care provider. Document Released: 03/01/2016 Document Revised:  02/14/2017 Document Reviewed: 03/01/2016 Elsevier Patient Education  2020 [employer].  Smoking Tobacco Information, Adult Smoking tobacco can be harmful to your health. Tobacco contains a poisonous (toxic), colorless chemical called nicotine. Nicotine is addictive. It changes the brain and can make it hard to stop smoking. Tobacco also has other toxic chemicals that can hurt your body and raise your risk of many cancers. How can smoking tobacco affect me? Smoking tobacco puts you at risk for:  Cancer. Smoking is most commonly associated with lung cancer, but can also lead to cancer in other parts of the body.  Chronic obstructive pulmonary disease (COPD). This is a long-term lung condition that makes it hard to breathe. It also gets worse over time.  High blood pressure (hypertension), heart disease, stroke, or heart attack.  Lung infections, such as pneumonia.  Cataracts. This is when the lenses in the eyes become clouded.  Digestive problems. This may include peptic ulcers, heartburn, and gastroesophageal reflux disease (GERD).  Oral health problems, such as gum disease and tooth loss.  Loss of taste and smell. Smoking can affect your appearance by causing:  Wrinkles.  Yellow or stained teeth, fingers, and fingernails. Smoking tobacco can also affect your social life, because:  It may be challenging to find places to smoke when away from home. Many workplaces, [employer], hotels, and public places are tobacco-free.  Smoking is expensive. This is due to the cost of tobacco and the long-term costs of treating health problems from smoking.  Secondhand smoke may affect those around you. Secondhand smoke can cause lung cancer, breathing problems, and heart disease. Children of smokers have a higher risk for: ? Sudden infant death syndrome (SIDS). ? Ear infections. ? Lung infections. If you currently smoke tobacco, quitting now can help you:  Lead a longer and healthier  life.  Look, smell, breathe, and feel better over time.  Save money.  Protect others from the harms of secondhand smoke. What actions can I take to prevent health problems? Quit smoking   Do not start smoking. Quit if you already do.  Make a plan to quit smoking and commit to it. Look for programs to help you and ask your health care provider for recommendations and ideas.  Set a date and write down all the reasons you want to quit.  Let your friends and family know you are quitting so they can help and support you. Consider finding friends who also want to quit. It can be easier to quit with someone else, so that you can support each other.  Talk with your health care provider about using nicotine replacement medicines to help you quit, such as gum, lozenges, patches, sprays, or pills.  Do not replace cigarette smoking  with electronic cigarettes, which are commonly called e-cigarettes. The safety of e-cigarettes is not known, and some may contain harmful chemicals.  If you try to quit but return to smoking, stay positive. It is common to slip up when you first quit, so take it one day at a time.  Be prepared for cravings. When you feel the urge to smoke, chew gum or suck on hard candy. Lifestyle  Stay busy and take care of your body.  Drink enough fluid to keep your urine pale yellow.  Get plenty of exercise and eat a healthy diet. This can help prevent weight gain after quitting.  Monitor your eating habits. Quitting smoking can cause you to have a larger appetite than when you smoke.  Find ways to relax. Go out with friends or family to a movie or a restaurant where people do not smoke.  Ask your health care provider about having regular tests (screenings) to check for cancer. This may include blood tests, imaging tests, and other tests.  Find ways to manage your stress, such as meditation, yoga, or exercise. Where to find support To get support to quit smoking,  consider:  Asking your health care provider for more information and resources.  Taking classes to learn more about quitting smoking.  Looking for local organizations that offer resources about quitting smoking.  Joining a support group for people who want to quit smoking in your local community.  Calling the smokefree.gov counselor helpline: 1-800-Quit-Now 716 586 8561) Where to find more information You may find more information about quitting smoking from:  HelpGuide.org: www.helpguide.org  https://[place].com/: smokefree.gov  American Lung Association: www.lung.org Contact a health care provider if you:  Have problems breathing.  Notice that your lips, nose, or fingers turn blue.  Have chest pain.  Are coughing up blood.  Feel faint or you pass out.  Have other health changes that cause you to worry. Summary  Smoking tobacco can negatively affect your health, the health of those around you, your finances, and your social life.  Do not start smoking. Quit if you already do. If you need help quitting, ask your health care provider.  Think about joining a support group for people who want to quit smoking in your local community. There are many effective programs that will help you to quit this behavior. This information is not intended to replace advice given to you by your health care provider. Make sure you discuss any questions you have with your health care provider. Document Released: 03/19/2016 Document Revised: 04/23/2017 Document Reviewed: 03/19/2016 Elsevier Patient Education  2020 [employer].

## 2018-09-22 NOTE — Progress Notes (Signed)
Safety precautions to be maintained throughout the outpatient stay will include: orient to surroundings, keep bed in low position, maintain call bell within reach at all times, provide assistance with transfer out of bed and ambulation.  

## 2018-09-23 ENCOUNTER — Telehealth: Payer: Self-pay | Admitting: *Deleted

## 2018-09-23 NOTE — Telephone Encounter (Signed)
Spoke with patient and he denies any questions or concerns re; procedure on yesterday.

## 2018-09-24 ENCOUNTER — Ambulatory Visit (INDEPENDENT_AMBULATORY_CARE_PROVIDER_SITE_OTHER): Payer: 59 | Admitting: Adult Health

## 2018-09-24 ENCOUNTER — Encounter: Payer: Self-pay | Admitting: Adult Health

## 2018-09-24 ENCOUNTER — Other Ambulatory Visit: Payer: Self-pay

## 2018-09-24 VITALS — BP 134/74 | HR 88 | Temp 99.0°F | Wt 206.2 lb

## 2018-09-24 DIAGNOSIS — R140 Abdominal distension (gaseous): Secondary | ICD-10-CM

## 2018-09-24 DIAGNOSIS — I10 Essential (primary) hypertension: Secondary | ICD-10-CM | POA: Diagnosis not present

## 2018-09-24 DIAGNOSIS — F419 Anxiety disorder, unspecified: Secondary | ICD-10-CM

## 2018-09-24 MED ORDER — BUSPIRONE HCL 5 MG PO TABS: 5.0000 mg | ORAL_TABLET | Freq: Two times a day (BID) | ORAL | 1 refills | Status: DC

## 2018-09-24 MED FILL — busPIRone HCL 5 MG TABS: 5 | 30 days supply | Qty: 60 | Fill #0

## 2018-09-24 NOTE — Progress Notes (Addendum)
Subjective:    Patient ID: Chad Wilson, male    DOB: 1975/10/30, 43 y.o.   MRN: [id number]  HPI 43 year old male who is being evaluated today for concern of elevated blood pressure readings.  His readings at his pain management appointments have been in the 170s over 100s.  He was advised to follow-up with his PCP for possible change in medication.  Currently he is on lisinopril 40 mg daily.  He denies symptoms such as headache or blurred vision.  He is not checking his blood pressure at home despite having a machine.  He does report feeling more anxious when he is at pain management.  Today in the office his initial blood pressure was 142/70 and a second blood pressure reading was 134/74.   Additionally he has not heard from psychiatry yet this referral was placed in April.  Continues to report pretty significant anxiety especially in the morning. He is currently taking Zoloft 100 mg daily and klonopin 0.5 mg BID PRN. His anxiety is worse in the morning.   During his exam today was noted that his abdomen was more distended and hard than normal.  He reports that his abdomen has slowly started to increase in size over an unknown amount of time.  He denies abdominal pain, nausea, vomiting, feeling of fullness and is having normal bowel movements.  His last liver enzymes 3 months ago were normal.   Review of Systems See HPI   Past Medical History:  Diagnosis Date  . Acute hepatitis C without mention of hepatic coma(070.51)    has been cleared   . Alcohol abuse, unspecified   . Anxiety and depression   . Anxiety states   . Essential hypertension, benign   . Graves disease    treated by Dr Loanne Drilling  . Panic disorder without agoraphobia   . Thyroid disease    Graves Disease  . Tobacco use disorder     Social History   Socioeconomic History  . Marital status: Married    Spouse name: Not on file  . Number of children: 0  . Years of education: Not on file  . Highest education  level: Not on file  Occupational History    Employer: [employer]  Social Needs  . Financial resource strain: Not on file  . Food insecurity    Worry: Not on file    Inability: Not on file  . Transportation needs    Medical: Not on file    Non-medical: Not on file  Tobacco Use  . Smoking status: Current Every Day Smoker    Packs/day: 1.00    Years: 30.00    Pack years: 30.00  . Smokeless tobacco: Current User    Types: Chew  Substance and Sexual Activity  . Alcohol use: Not Currently    Comment: socially   . Drug use: No    Comment: cocaine  . Sexual activity: Not on file  Lifestyle  . Physical activity    Days per week: Not on file    Minutes per session: Not on file  . Stress: Not on file  Relationships  . Social Herbalist on phone: Not on file    Gets together: Not on file    Attends religious service: Not on file    Active member of club or organization: Not on file    Attends meetings of clubs or organizations: Not on file    Relationship status: Not on file  .  Intimate partner violence    Fear of current or ex partner: Not on file    Emotionally abused: Not on file    Physically abused: Not on file    Forced sexual activity: Not on file  Other Topics Concern  . Not on file  Social History Narrative   He is working in [employer]    Diet: Fruits and veggies, water poweraid, occ sweet tea    Past Surgical History:  Procedure Laterality Date  . ANTERIOR CERVICAL DECOMP/DISCECTOMY FUSION N/A 05/21/2017   Procedure: Anterior Cervical Decompression Fusion Cervical five-six, Cervical six-seven;  Surgeon: Eustace Moore, MD;  Location: [facility];  Service: Neurosurgery;  Laterality: N/A;  . MULTIPLE TOOTH EXTRACTIONS  15 yrs. ago    Family History  Problem Relation Age of Onset  . Anxiety disorder Father   . Heart disease Father   . Hypertension Father   . Thyroid disease Mother   . Anxiety disorder Sister   . Hyperthyroidism Sister      Allergies  Allergen Reactions  . Chantix [Varenicline] Other (See Comments)    Agitation and sleep walking     Current Outpatient Medications on File Prior to Visit  Medication Sig Dispense Refill  . albuterol (VENTOLIN HFA) 108 (90 Base) MCG/ACT inhaler Inhale 2 puffs into the lungs every 6 (six) hours as needed for wheezing or shortness of breath. 1 Inhaler 1  . atorvastatin (LIPITOR) 20 MG tablet TAKE 1 TABLET (20 MG TOTAL) BY MOUTH DAILY. 90 tablet 1  . Cholecalciferol (VITAMIN D3) 125 MCG (5000 UT) CAPS Take 1 capsule (5,000 Units total) by mouth daily with breakfast. Take along with calcium and magnesium. 30 capsule 5  . clonazePAM (KLONOPIN) 0.5 MG tablet Take 1 tablet (0.5 mg total) by mouth 2 (two) times daily as needed for anxiety. 30 tablet 0  . DEPO-TESTOSTERONE 200 MG/ML injection Take 200 mg by mouth every 14 (fourteen) days.    Marland Kitchen levothyroxine (SYNTHROID) 150 MCG tablet TAKE 1 TABLET BY MOUTH DAILY. 90 tablet 3  . lisinopril (PRINIVIL,ZESTRIL) 40 MG tablet TAKE 1 TABLET BY MOUTH ONCE DAILY 90 tablet 1  . [START ON 09/27/2018] oxyCODONE (OXY IR/ROXICODONE) 5 MG immediate release tablet Take 1 tablet (5 mg total) by mouth every 6 (six) hours as needed for up to 30 days for severe pain. Avoid clonazepam taking within 8 hrs 120 tablet 0  . sildenafil (REVATIO) 20 MG tablet     . [DISCONTINUED] omeprazole (PRILOSEC) 20 MG capsule Take 1 capsule (20 mg total) by mouth daily. 30 capsule 3   No current facility-administered medications on file prior to visit.     BP 134/74 (BP Location: Left Arm, Patient Position: Sitting, Cuff Size: Normal)   Pulse 88   Temp 99 F (37.2 C) (Oral)   Wt 206 lb 3.2 oz (93.5 kg)   SpO2 93%   BMI 33.28 kg/m       Objective:   Physical Exam Vitals signs and nursing note reviewed.  Constitutional:      Appearance: Normal appearance.  Cardiovascular:     Rate and Rhythm: Normal rate and regular rhythm.     Pulses: Normal pulses.     Heart  sounds: Normal heart sounds.  Pulmonary:     Effort: Pulmonary effort is normal.     Breath sounds: Normal breath sounds.  Abdominal:     General: Bowel sounds are normal. There is distension. There is no abdominal bruit.     Palpations: There  is no fluid wave or mass.     Tenderness: There is no abdominal tenderness.  Neurological:     Mental Status: He is alert.       Assessment & Plan:  1. HYPERTENSION, BENIGN ESSENTIAL -At baseline in the office today.  We will continue to monitor  2. Anxiety -We will add low-dose BuSpar to his regimen of Zoloft in the hopes that this will control his anxiety more than we can stop using clonazepam - busPIRone (BUSPAR) 5 MG tablet; Take 1 tablet (5 mg total) by mouth 2 (two) times daily.  Dispense: 60 tablet; Refill: 1  3. Abdominal distention -Our lab was not functional at time of visit.  Will get ultrasound of right upper quadrant to rule out liver disease. - US Abdomen Limited RUQ; Future   Dorothyann Peng, NP

## 2018-09-29 MED FILL — oxyCODONE HCL 5 MG TABS: 5 | 30 days supply | Qty: 120 | Fill #0

## 2018-10-03 IMAGING — CR DG CHEST 2V
2 series · 2 of 2 positions shown · non-contrast
Comparison: None.

CLINICAL DATA: 41 y/o M; 2 weeks of fatigue, hypertension,
headache, lightheadedness. Chest pain.

EXAM:
CHEST  2 VIEW

[chest ap]
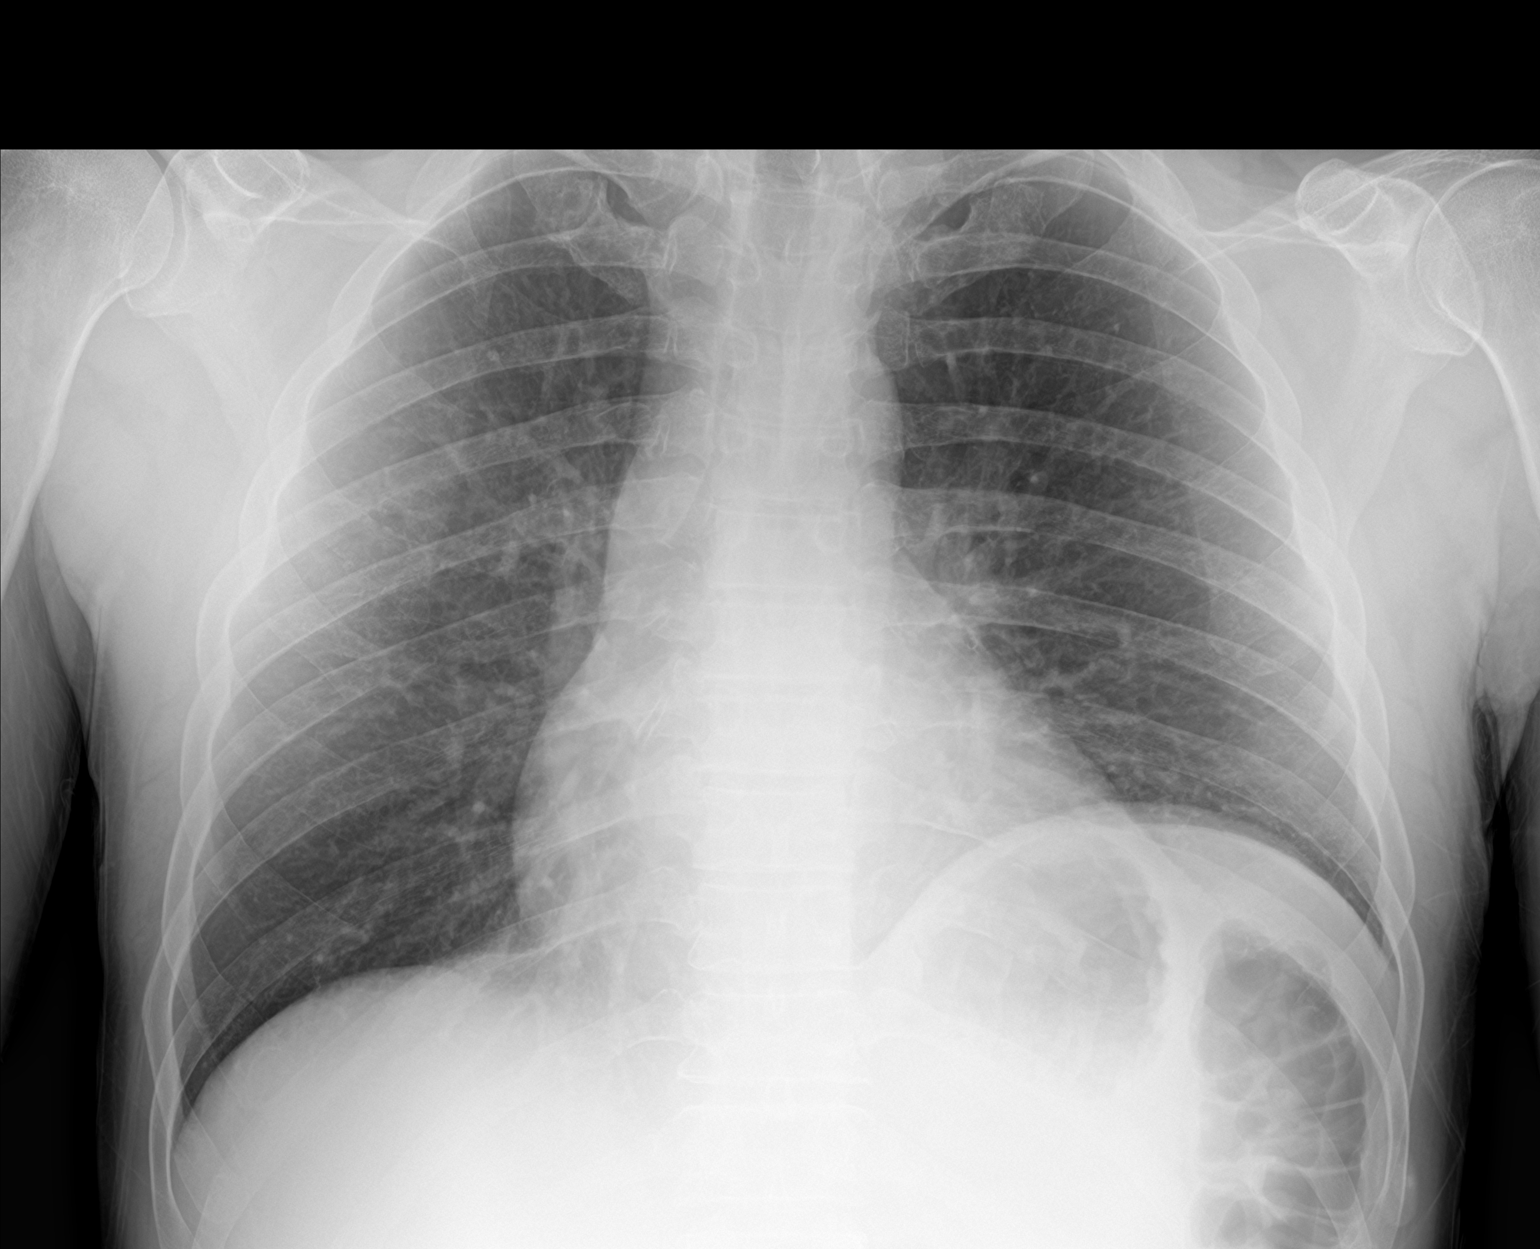

[chest lat]
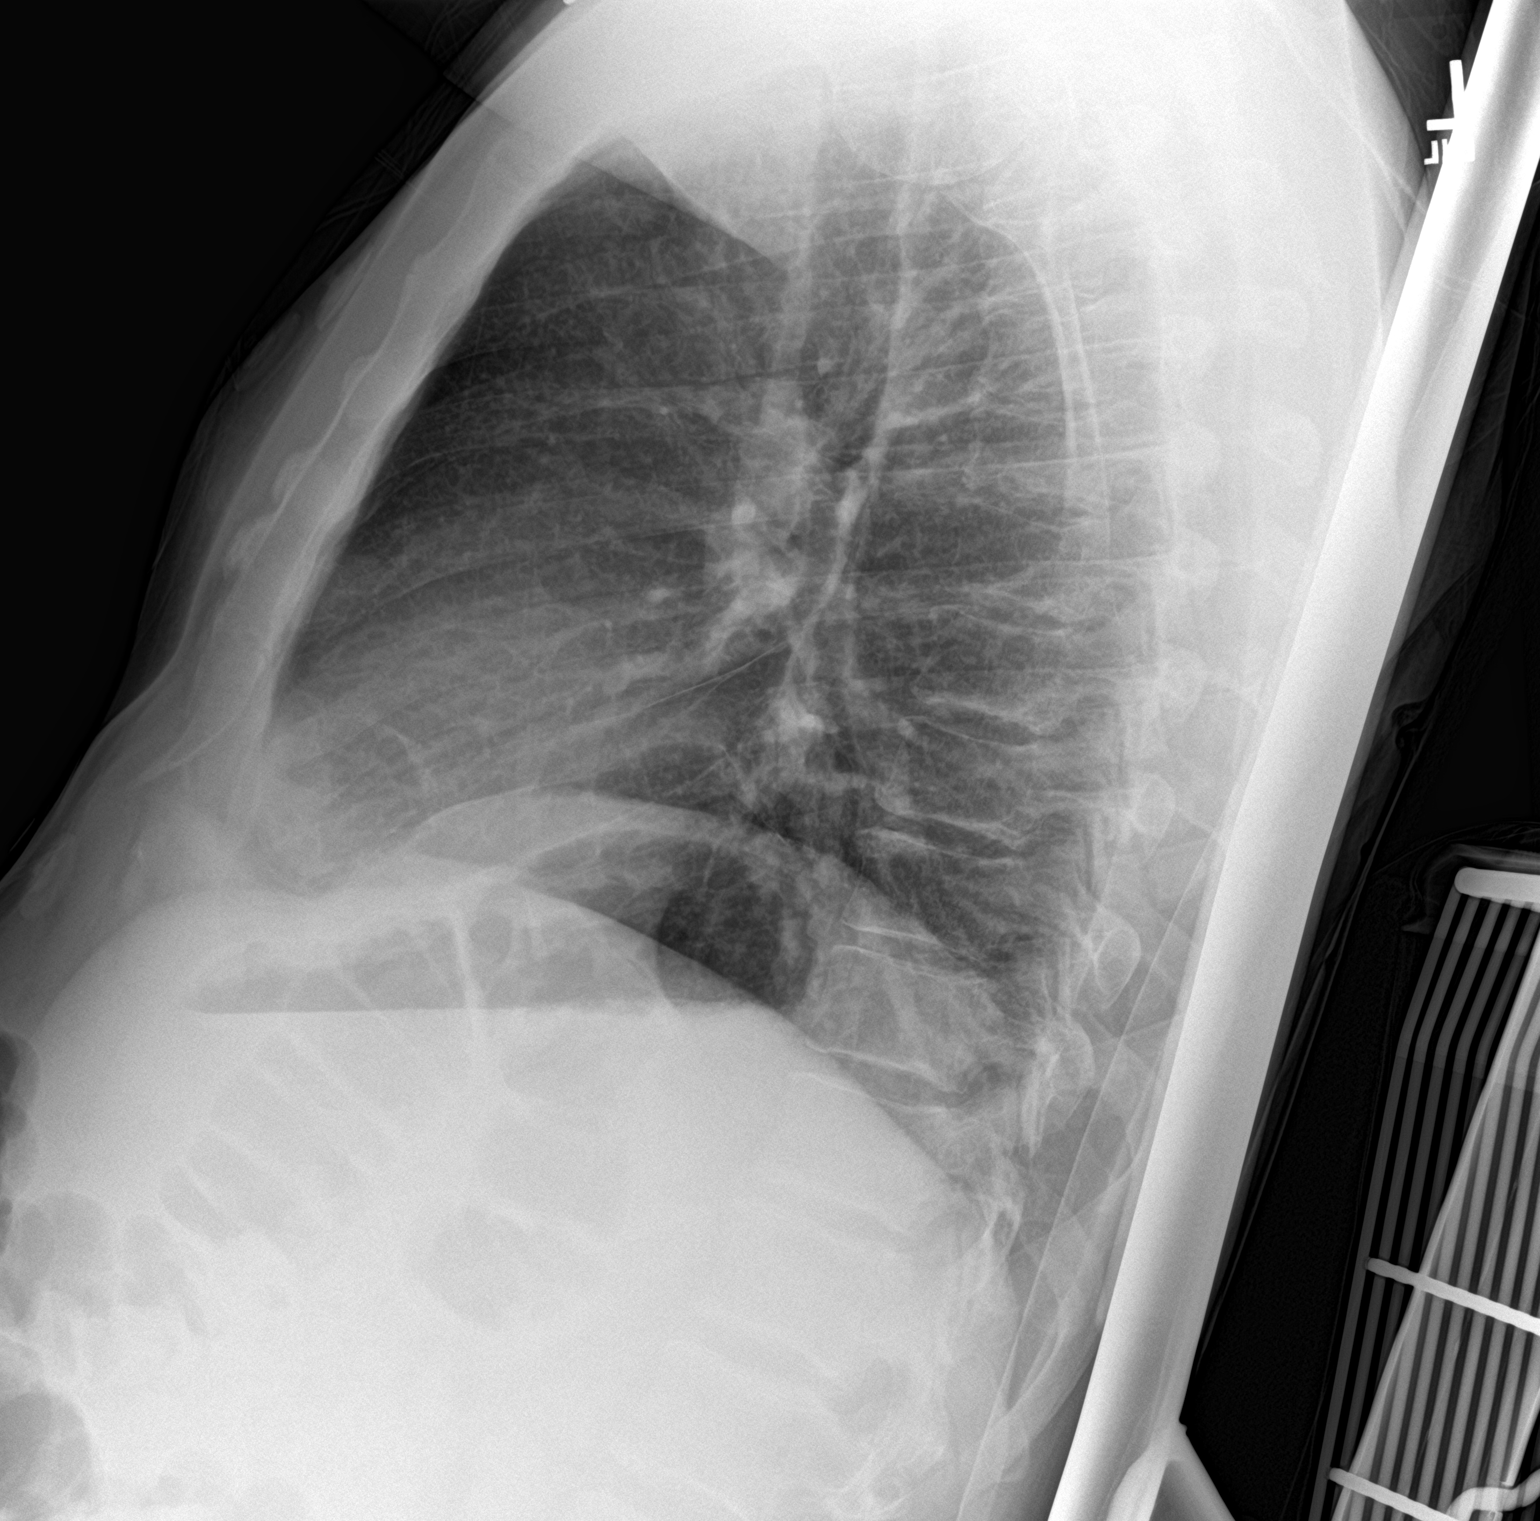

[2 of 2 positions shown; findings below may reference images not displayed]

FINDINGS: Normal cardiac silhouette. Clear lungs. No pleural effusion or
pneumothorax. Elevated left hemidiaphragm. No acute osseous
abnormality is evident.
IMPRESSION: No active cardiopulmonary disease.  Elevated left hemidiaphragm.

By: Adama Maysonet M.D.

## 2018-10-05 ENCOUNTER — Emergency Department (HOSPITAL_COMMUNITY): Payer: 59

## 2018-10-05 ENCOUNTER — Other Ambulatory Visit: Payer: Self-pay

## 2018-10-05 ENCOUNTER — Emergency Department (HOSPITAL_COMMUNITY)
Admission: EM | Admit: 2018-10-05 | Discharge: 2018-10-05 | Disposition: A | Discharge status: Home / Self Care | Payer: 59 | Attending: Emergency Medicine | Admitting: Emergency Medicine

## 2018-10-05 ENCOUNTER — Encounter (HOSPITAL_COMMUNITY): Payer: Self-pay | Admitting: *Deleted

## 2018-10-05 DIAGNOSIS — K529 Noninfective gastroenteritis and colitis, unspecified: Secondary | ICD-10-CM | POA: Diagnosis not present

## 2018-10-05 DIAGNOSIS — R112 Nausea with vomiting, unspecified: Secondary | ICD-10-CM | POA: Diagnosis not present

## 2018-10-05 DIAGNOSIS — E860 Dehydration: Secondary | ICD-10-CM

## 2018-10-05 DIAGNOSIS — F17220 Nicotine dependence, chewing tobacco, uncomplicated: Secondary | ICD-10-CM | POA: Insufficient documentation

## 2018-10-05 DIAGNOSIS — Z209 Contact with and (suspected) exposure to unspecified communicable disease: Secondary | ICD-10-CM | POA: Diagnosis not present

## 2018-10-05 DIAGNOSIS — E039 Hypothyroidism, unspecified: Secondary | ICD-10-CM | POA: Insufficient documentation

## 2018-10-05 DIAGNOSIS — R42 Dizziness and giddiness: Secondary | ICD-10-CM | POA: Diagnosis not present

## 2018-10-05 DIAGNOSIS — R0902 Hypoxemia: Secondary | ICD-10-CM | POA: Diagnosis not present

## 2018-10-05 DIAGNOSIS — F17210 Nicotine dependence, cigarettes, uncomplicated: Secondary | ICD-10-CM | POA: Insufficient documentation

## 2018-10-05 DIAGNOSIS — Z79899 Other long term (current) drug therapy: Secondary | ICD-10-CM | POA: Insufficient documentation

## 2018-10-05 DIAGNOSIS — I10 Essential (primary) hypertension: Secondary | ICD-10-CM | POA: Diagnosis not present

## 2018-10-05 DIAGNOSIS — R0682 Tachypnea, not elsewhere classified: Secondary | ICD-10-CM | POA: Diagnosis not present

## 2018-10-05 DIAGNOSIS — R51 Headache: Secondary | ICD-10-CM | POA: Diagnosis not present

## 2018-10-05 DIAGNOSIS — R000 Tachycardia, unspecified: Secondary | ICD-10-CM | POA: Diagnosis not present

## 2018-10-05 DIAGNOSIS — B182 Chronic viral hepatitis C: Secondary | ICD-10-CM | POA: Diagnosis not present

## 2018-10-05 LAB — CBC WITH DIFFERENTIAL/PLATELET
Abs Immature Granulocytes: 0.09 10*3/uL — ABNORMAL HIGH (ref 0.00–0.07)
Basophils Absolute: 0 10*3/uL (ref 0.0–0.1)
Basophils Relative: 0 %
Eosinophils Absolute: 0 10*3/uL (ref 0.0–0.5)
Eosinophils Relative: 0 %
HCT: 51.2 % (ref 39.0–52.0)
Hemoglobin: 18.1 g/dL — ABNORMAL HIGH (ref 13.0–17.0)
Immature Granulocytes: 1 %
Lymphocytes Relative: 2 %
Lymphs Abs: 0.3 10*3/uL — ABNORMAL LOW (ref 0.7–4.0)
MCH: 34 pg (ref 26.0–34.0)
MCHC: 35.4 g/dL (ref 30.0–36.0)
MCV: 96.1 fL (ref 80.0–100.0)
Monocytes Absolute: 1 10*3/uL (ref 0.1–1.0)
Monocytes Relative: 8 %
Neutro Abs: 10.6 10*3/uL — ABNORMAL HIGH (ref 1.7–7.7)
Neutrophils Relative %: 89 %
Platelets: 202 10*3/uL (ref 150–400)
RBC: 5.33 MIL/uL (ref 4.22–5.81)
RDW: 15 % (ref 11.5–15.5)
WBC: 12.1 10*3/uL — ABNORMAL HIGH (ref 4.0–10.5)
nRBC: 0.2 % (ref 0.0–0.2)

## 2018-10-05 LAB — URINALYSIS, ROUTINE W REFLEX MICROSCOPIC
Bilirubin Urine: NEGATIVE
Glucose, UA: 50 mg/dL — AB
Hgb urine dipstick: NEGATIVE
Ketones, ur: NEGATIVE mg/dL
Leukocytes,Ua: NEGATIVE
Nitrite: NEGATIVE
Protein, ur: 30 mg/dL — AB
Specific Gravity, Urine: 1.023 (ref 1.005–1.030)
pH: 5 (ref 5.0–8.0)

## 2018-10-05 LAB — TROPONIN I (HIGH SENSITIVITY)
Troponin I (High Sensitivity): 8 ng/L (ref <18)
Troponin I (High Sensitivity): 8 ng/L (ref <18)

## 2018-10-05 LAB — COMPREHENSIVE METABOLIC PANEL
ALT: 56 U/L — ABNORMAL HIGH (ref 0–44)
AST: 22 U/L (ref 15–41)
Albumin: 4 g/dL (ref 3.5–5.0)
Alkaline Phosphatase: 87 U/L (ref 38–126)
Anion gap: 15 (ref 5–15)
BUN: 17 mg/dL (ref 6–20)
CO2: 25 mmol/L (ref 22–32)
Calcium: 8.8 mg/dL — ABNORMAL LOW (ref 8.9–10.3)
Chloride: 101 mmol/L (ref 98–111)
Creatinine, Ser: 1.07 mg/dL (ref 0.61–1.24)
GFR calc Af Amer: >60 mL/min (ref >60)
GFR calc non Af Amer: >60 mL/min (ref >60)
Glucose, Bld: 174 mg/dL — ABNORMAL HIGH (ref 70–99)
Potassium: 3.2 mmol/L — ABNORMAL LOW (ref 3.5–5.1)
Sodium: 141 mmol/L (ref 135–145)
Total Bilirubin: 0.9 mg/dL (ref 0.3–1.2)
Total Protein: 7.6 g/dL (ref 6.5–8.1)

## 2018-10-05 LAB — CBG MONITORING, ED: Glucose-Capillary: 185 mg/dL — ABNORMAL HIGH (ref 70–99)

## 2018-10-05 LAB — LIPASE, BLOOD: Lipase: 26 U/L (ref 11–51)

## 2018-10-05 LAB — BRAIN NATRIURETIC PEPTIDE: B Natriuretic Peptide: 10 pg/mL (ref 0.0–100.0)

## 2018-10-05 MED ORDER — SODIUM CHLORIDE 0.9 % IV SOLN: 1000.0000 mL | INTRAVENOUS | Status: DC

## 2018-10-05 MED ORDER — ONDANSETRON HCL 4 MG/2ML IJ SOLN
4.0000 mg | Freq: Once | INTRAMUSCULAR | Status: AC
  Administered 2018-10-05: 4 mg via INTRAVENOUS
  Filled 2018-10-05: qty 2

## 2018-10-05 MED ORDER — SODIUM CHLORIDE 0.9 % IV SOLN
1000.0000 mL | INTRAVENOUS | Status: DC
  Administered 2018-10-05: 1000 mL via INTRAVENOUS

## 2018-10-05 MED ORDER — SODIUM CHLORIDE 0.9 % IV BOLUS (SEPSIS)
1000.0000 mL | Freq: Once | INTRAVENOUS | Status: AC
  Administered 2018-10-05: 13:00:00 1000 mL via INTRAVENOUS

## 2018-10-05 MED ORDER — SODIUM CHLORIDE 0.9 % IV BOLUS (SEPSIS)
1000.0000 mL | Freq: Once | INTRAVENOUS | Status: AC
  Administered 2018-10-05: 1000 mL via INTRAVENOUS

## 2018-10-05 MED ORDER — TRAMADOL HCL 50 MG PO TABS
100.0000 mg | ORAL_TABLET | Freq: Once | ORAL | Status: AC
  Administered 2018-10-05: 100 mg via ORAL
  Filled 2018-10-05: qty 2

## 2018-10-05 MED ORDER — PROMETHAZINE HCL 12.5 MG PO TABS: 12.5000 mg | ORAL_TABLET | Freq: Four times a day (QID) | ORAL | 0 refills | Status: DC | PRN

## 2018-10-05 NOTE — Discharge Instructions (Addendum)
Your tests reveal dehydration.  There is evidence on your ultrasound of liver disease.  Please discuss this with your physician, and also discuss with them ways in which you can decrease your alcohol intake.  It is important that you increase your water, Gatorade, fluids, etc.  Use promethazine every 6 hours if needed for vomiting.  Please see your primary physician for follow-up of these symptoms.  Return to the emergency department if any changes in your condition, worsening of your symptoms, problems or concerns.

## 2018-10-05 NOTE — ED Provider Notes (Signed)
[facility] EMERGENCY DEPARTMENT Provider Note   CSN: [id number] Arrival date & time: 10/05/18  1856     History   Chief Complaint Chief Complaint  Patient presents with  . Emesis    HPI Chad Wilson is a 43 y.o. male.     Patient is a 43 year old male who presents to the emergency department with vomiting.  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 Patient presents via EMS because of repeated vomiting.  Patient states this problem actually started around 3:00 this morning.  He says he has had probably 15-20 episodes of vomiting from 3:00 this morning up until his arrival in the emergency department.  The patient states that he was in an outside building on yesterday and he said the temperature was very high, probably over 100 degrees.  The patient states he drank a lot of [facility] and [employer] hard lemonade.  He says he did not drink a lot of water.  It is of note that he does not have air-conditioning in his home..  The patient denies blood in his vomitus, he is not noted changes in his stool, and has not seen any blood in his stool recently.  He is not had any injury to his abdomen.  He says that his abdomen is enlarged, but this is similar to what he has had in the past.  He complains of feeling weak, as well as having a headache.  He has not taken any medications for this illness.  The history is provided by the patient.  Emesis Associated symptoms: myalgias   Associated symptoms: no abdominal pain, no arthralgias and no cough     Past Medical History:  Diagnosis Date  . Acute hepatitis C without mention of hepatic coma(070.51)    has been cleared   . Alcohol abuse, unspecified   . Anxiety and depression   . Anxiety states   . Essential hypertension, benign   . Graves disease    treated by Dr Loanne Drilling  . Panic disorder without agoraphobia   .  Thyroid disease    Graves Disease  . Tobacco use disorder     Patient Active Problem List   Diagnosis Date Noted  . Preoperative testing 08/17/2018  . Grade 1 Lumbosacral Retrolisthesis of L5/S1 06/29/2018  . Lumbar facet hypertrophy (Bilateral) 06/29/2018  . Osteoarthritis of facet joint of lumbar spine 06/29/2018  . 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  . Lumbar spondylosis 06/29/2018  . DDD (degenerative disc disease), cervical 06/29/2018  . Cervicalgia 06/29/2018  . Abnormal MRI, lumbar spine 06/29/2018  . Long term prescription benzodiazepine use 06/29/2018  . Vitamin B12 deficiency 06/28/2018  . Vitamin D insufficiency 06/28/2018  . Spondylosis without myelopathy or radiculopathy, lumbosacral region 06/28/2018  . Chronic pain syndrome 06/03/2018  . Pharmacologic therapy 06/03/2018  . Long term current use of opiate analgesic 06/03/2018  . Disorder of skeletal system 06/03/2018  . Problems influencing health status 06/03/2018  . Chronic low back pain (Primary area of Pain) (Bilateral) (R=L) w/o sciatica 06/03/2018  . DDD (degenerative disc disease), lumbosacral 04/06/2018  . Lumbar facet syndrome (Bilateral) 04/06/2018  . 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 History of cervical spinal surgery 05/21/2017  . Hypothyroidism 04/14/2017  . Chronic neck pain with history of cervical spinal surgery 09/06/2011  . Cervical radiculopathy at C6 09/06/2011  . JAUNDICE 05/21/2010  .  HEPATITIS C 11/21/2009  . Anxiety and depression 11/21/2009  . PANIC ATTACK 11/21/2009  . Alcohol abuse 11/21/2009  . Tobacco abuse 11/21/2009  . HYPERTENSION, BENIGN ESSENTIAL 11/21/2009  . FATIGUE 11/21/2009    Past Surgical History:  Procedure Laterality Date  . ANTERIOR CERVICAL DECOMP/DISCECTOMY FUSION N/A 05/21/2017   Procedure: Anterior Cervical Decompression Fusion Cervical five-six, Cervical six-seven;  Surgeon: Eustace Moore, MD;  Location: [place];  Service: Neurosurgery;  Laterality: N/A;  . MULTIPLE TOOTH EXTRACTIONS  15 yrs. ago        Home Medications    Prior to Admission medications   Medication Sig Start Date End Date Taking? Authorizing Provider  albuterol (VENTOLIN HFA) 108 (90 Base) MCG/ACT inhaler Inhale 2 puffs into the lungs every 6 (six) hours as needed for wheezing or shortness of breath. 07/03/18   Nafziger, Tommi Rumps, NP  atorvastatin (LIPITOR) 20 MG tablet TAKE 1 TABLET (20 MG TOTAL) BY MOUTH DAILY. 04/21/18   Nafziger, Tommi Rumps, NP  busPIRone (BUSPAR) 5 MG tablet Take 1 tablet (5 mg total) by mouth 2 (two) times daily. 09/24/18 10/24/18  Nafziger, Tommi Rumps, NP  Cholecalciferol (VITAMIN D3) 125 MCG (5000 UT) CAPS Take 1 capsule (5,000 Units total) by mouth daily with breakfast. Take along with calcium and magnesium. 06/29/18 12/26/18  Milinda Pointer, MD  clonazePAM (KLONOPIN) 0.5 MG tablet Take 1 tablet (0.5 mg total) by mouth 2 (two) times daily as needed for anxiety. 07/23/18   Nafziger, Tommi Rumps, NP  DEPO-TESTOSTERONE 200 MG/ML injection Take 200 mg by mouth every 14 (fourteen) days. 05/07/18   [provider]  levothyroxine (SYNTHROID) 150 MCG tablet TAKE 1 TABLET BY MOUTH DAILY. 07/16/18   Renato Shin, MD  lisinopril (PRINIVIL,ZESTRIL) 40 MG tablet TAKE 1 TABLET BY MOUTH ONCE DAILY 04/21/18   Nafziger, Tommi Rumps, NP  oxyCODONE (OXY IR/ROXICODONE) 5 MG immediate release tablet Take 1 tablet (5 mg total) by mouth every 6 (six) hours as needed for up to 30 days for severe pain. Avoid clonazepam taking within 8 hrs 09/27/18 10/27/18  Milinda Pointer, MD  sildenafil (REVATIO) 20 MG tablet  07/16/18   [provider]  omeprazole (PRILOSEC) 20 MG capsule Take 1 capsule (20 mg total) by mouth daily. 12/24/10 05/27/11  Owens Loffler, MD    Family History Family History  Problem Relation Age of Onset  . Anxiety disorder Father   . Heart disease Father   . Hypertension Father   . Thyroid disease Mother   . Anxiety disorder  Sister   . Hyperthyroidism Sister     Social History Social History   Tobacco Use  . Smoking status: Current Every Day Smoker    Packs/day: 1.00    Years: 30.00    Pack years: 30.00    Types: Cigarettes  . Smokeless tobacco: Current User    Types: Chew  Substance Use Topics  . Alcohol use: Yes    Alcohol/week: 42.0 standard drinks    Types: 42 Cans of beer per week    Comment: 6 pack of beer daily  . Drug use: Not Currently    Comment: hx of cocaine     Allergies   Chantix [varenicline]   Review of Systems Review of Systems  Constitutional: Positive for fatigue. Negative for activity change.       All ROS Neg except as noted in HPI  HENT: Negative.   Eyes: Negative for photophobia and discharge.  Respiratory: Negative for cough, shortness of breath and wheezing.   Cardiovascular: Negative  for chest pain and palpitations.  Gastrointestinal: Positive for vomiting. Negative for abdominal pain and blood in stool.  Genitourinary: Negative for dysuria, frequency and hematuria.  Musculoskeletal: Positive for back pain and myalgias. Negative for arthralgias and neck pain.  Skin: Negative.   Neurological: Negative for dizziness, seizures and speech difficulty.  Psychiatric/Behavioral: Negative for confusion and hallucinations. The patient is nervous/anxious.      Physical Exam Updated Vital Signs BP (!) 146/96 (BP Location: Left Arm)   Pulse (!) 112   Temp 99.1 F (37.3 C) (Oral)   Resp 18   Ht 5' 6"  (1.676 m)   Wt 79.8 kg   BMI 28.41 kg/m   Physical Exam Vitals signs and nursing note reviewed.  Constitutional:      Appearance: He is well-developed. He is not toxic-appearing.  HENT:     Head: Normocephalic.     Right Ear: Tympanic membrane and external ear normal.     Left Ear: Tympanic membrane and external ear normal.  Eyes:     General: Lids are normal.     Conjunctiva/sclera:     Right eye: Right conjunctiva is not injected.     Left eye: Left  conjunctiva is not injected.     Pupils: Pupils are equal, round, and reactive to light.     Comments: No jaundice appreciated.  Neck:     Musculoskeletal: Normal range of motion and neck supple.     Vascular: No carotid bruit.  Cardiovascular:     Rate and Rhythm: Regular rhythm. Tachycardia present.     Pulses: Normal pulses.     Heart sounds: Normal heart sounds.  Pulmonary:     Effort: Tachypnea present. No respiratory distress.     Breath sounds: Normal breath sounds.  Abdominal:     General: Bowel sounds are normal. There is distension.     Palpations: Abdomen is soft.     Tenderness: There is no abdominal tenderness. There is no right CVA tenderness, left CVA tenderness or guarding.     Comments: Abdomen is distended.  Bowel sounds are present and active.  No palpable mass appreciated.  Musculoskeletal: Normal range of motion.  Lymphadenopathy:     Head:     Right side of head: No submandibular adenopathy.     Left side of head: No submandibular adenopathy.     Cervical: No cervical adenopathy.  Skin:    General: Skin is warm and dry.  Neurological:     Mental Status: He is alert and oriented to person, place, and time.     Cranial Nerves: No cranial nerve deficit.     Sensory: No sensory deficit.  Psychiatric:        Speech: Speech normal.      ED Treatments / Results  Labs (all labs ordered are listed, but only abnormal results are displayed) Labs Reviewed - No data to display  EKG None  Radiology No results found.  Procedures Procedures (including critical care time)  Medications Ordered in ED Medications - No data to display   Initial Impression / Assessment and Plan / ED Course  I have reviewed the triage vital signs and the nursing notes.  Pertinent labs & imaging results that were available during my care of the patient were reviewed by me and considered in my medical decision making (see chart for details).          Final Clinical  Impressions(s) / ED Diagnoses MDM Vital signs showed tachycardia and tachypnea.  Pulse oximetry is 95 to 96% on room air.  Within normal limits by my interpretation.  Patient reports working in a very hot area.  He has not been drinking much water, but has been drinking [employer] and [employer] hard lemonade.  He has had multiple episodes of vomiting.  He feels weak and presents now for assistance with his weakness and with his headache. CBG is elevated at 185  Chest x-ray shows mild left basilar scarring.  There is elevation of the left hemidiaphragm, however this is been present since April 2018.  IV fluids started.  Urine analysis shows a turbid yellow specimen with specific gravity of 1.0-3.  The leukocyte esterase is negative, nitrates are negative, there are many bacteria present. A troponin was obtained and found to be normal at 8.  Lipase was normal at 26. The comprehensive metabolic panel shows the potassium to be low at 3.2, the alkaline phosphatase is elevated at 56, glucose is elevated at 174, otherwise the comprehensive metabolic panel is within normal limits. Complete blood count shows the white blood cells to be elevated at 12,100, the hemoglobin is elevated at 18.1.  The hematocrit is 51.2.  It is believed that this is probably related to concentration.  Patient has nausea, vomiting, and a history of hepatitis C.  An ultrasound was obtained of the right upper quadrant.  There is increased echotexture throughout the liver it was suspected that the patient had fatty intrinsic liver disease.  There are no other focal abnormalities appreciated.  I discussed with the patient the importance of increasing his hydration.  I discussed with him the importance of abstaining from alcoholic beverages.  The patient acknowledges understanding of the lab results and the imaging results and is in agreement with this plan.   Final diagnoses:  Nausea and vomiting  Gastroenteritis  Dehydration     ED Discharge Orders         Ordered    promethazine (PHENERGAN) 12.5 MG tablet  Every 6 hours PRN     10/05/18 1513           Lily Kocher, PA-C 10/06/18 2209    Davonna Belling, MD 10/12/18 2147

## 2018-10-05 NOTE — ED Triage Notes (Signed)
Pt brought in by [facility] with c/o n/v/d, dry mouth and headache that started this morning at 0300. Pt reports he was out working in the heat yesterday and drank 8-9 beers. Pt reports daily use of 6 pack of beer. Pt reports he has no air conditioning in his home. Denies abdominal pain.

## 2018-10-08 ENCOUNTER — Telehealth: Payer: Self-pay | Admitting: Adult Health

## 2018-10-08 ENCOUNTER — Encounter: Payer: Self-pay | Admitting: Adult Health

## 2018-10-08 DIAGNOSIS — R932 Abnormal findings on diagnostic imaging of liver and biliary tract: Secondary | ICD-10-CM

## 2018-10-08 NOTE — Telephone Encounter (Signed)
Spoke to patient about his recent ER visit and ultrasound of his right upper quadrant showed.  He reports that he is feeling better since his ER visit.  We discussed CT scan of abdomen as well as GI consultation due to concern of liver disease.  He is okay with this plan

## 2018-10-14 ENCOUNTER — Ambulatory Visit: Payer: 59 | Admitting: Pain Medicine

## 2018-10-15 ENCOUNTER — Other Ambulatory Visit: Payer: Self-pay | Admitting: Adult Health

## 2018-10-15 DIAGNOSIS — I10 Essential (primary) hypertension: Secondary | ICD-10-CM

## 2018-10-15 MED FILL — ALBUTEROL SULFATE HFA 108 (: 108 (90 BAS | 25 days supply | Qty: 9 | Fill #0

## 2018-10-15 MED FILL — SILDENAFIL CITRATE 20 MG TA: 20 | 18 days supply | Qty: 90 | Fill #0

## 2018-10-15 MED FILL — LEVOTHYROXINE 150 MCG TAB: 150 | 90 days supply | Qty: 90 | Fill #0

## 2018-10-15 MED FILL — SERTRALINE HCL 100 MG TAB: 100 | 90 days supply | Qty: 180 | Fill #0

## 2018-10-16 ENCOUNTER — Encounter: Payer: Self-pay | Admitting: Family Medicine

## 2018-10-16 MED FILL — ATORVASTATIN 20 MG TABLET: 20 | 90 days supply | Qty: 90 | Fill #0

## 2018-10-16 MED FILL — LISINOPRIL 40 MG TABLET: 40 | 90 days supply | Qty: 90 | Fill #0

## 2018-10-16 NOTE — Telephone Encounter (Signed)
Sent to the pharmacy by e-scribe for 90 days.  Letter released to [place].  Pt past due for cpx.

## 2018-10-20 ENCOUNTER — Encounter: Payer: Self-pay | Admitting: Pain Medicine

## 2018-10-20 NOTE — Progress Notes (Signed)
Pain Management Virtual Encounter Note - Virtual Visit via Telephone Telehealth (real-time audio visits between healthcare provider and patient).   Patient's Phone No. & Preferred Pharmacy:  [phone] (home); [phone] (mobile); (Preferred) [phone] [email or web address]  [place], [place] - [address] [facility]. [address] [place] [address] Phone: [phone] Fax: [phone]    Pre-screening note:  Our staff contacted Chad Wilson and offered him an "in person", "face-to-face" appointment versus a telephone encounter. He indicated preferring the telephone encounter, at this time.   Reason for Virtual Visit: COVID-19*  Social distancing based on CDC and AMA recommendations.   I contacted Chad Wilson on 10/21/2018 via telephone.      I clearly identified myself as Chad Cola, MD. I verified that I was speaking with the correct person using two identifiers (Name: Chad Wilson, and date of birth: 1975-11-17).  Advanced Informed Consent I sought verbal advanced consent from Chad Brassell for virtual visit interactions. I informed Chad Wilson of possible security and privacy concerns, risks, and limitations associated with providing "not-in-person" medical evaluation and management services. I also informed Chad Wilson of the availability of "in-person" appointments. Finally, I informed him that there would be a charge for the virtual visit and that he could be  personally, fully or partially, financially responsible for it. Chad Wilson expressed understanding and agreed to proceed.   Historic Elements   [name] is a 43 y.o. year old, male patient evaluated today after his last encounter by our practice on 09/23/2018. [name]  has a past medical history of Acute hepatitis C without mention of hepatic coma(070.51), Alcohol abuse, unspecified, Anxiety and depression, Anxiety states, Essential hypertension, benign, Graves disease, Panic  disorder without agoraphobia, Thyroid disease, and Tobacco use disorder. He also  has a past surgical history that includes Multiple tooth extractions (15 yrs. ago) and Anterior cervical decomp/discectomy fusion (N/A, 05/21/2017). [name] has a current medication list which includes the following prescription(s): albuterol, atorvastatin, buspirone, vitamin d3, clonazepam, depo-testosterone, levothyroxine, lisinopril, oxycodone, promethazine, sildenafil, pregabalin, and omeprazole. He  reports that he has been smoking cigarettes. He has a 30.00 pack-year smoking history. His smokeless tobacco use includes chew. He reports current alcohol use of about 42.0 standard drinks of alcohol per week. He reports previous drug use. [name] is allergic to chantix [varenicline].   HPI  Today, he is being contacted for both, medication management and a post-procedure assessment.  Based on the results of the second diagnostic injection, I believe that he would be an excellent candidate for radiofrequency ablation.  Today I have spoken to the patient and I have explained to him about the radiofrequency including the risk possible complications and the duration.  Today I will also start the patient on low-dose Lyrica 25 mg p.o. twice daily and we will increase it slowly as tolerated.  Medical Necessity: Chad Wilson has experienced debilitating chronic pain from the Lumbosacral Facet Syndrome (Spondylosis without myelopathy or radiculopathy, lumbosacral region [M47.817]) that has persisted for longer than three months of failed non-surgical care and has either failed to respond, or was unable to tolerate, or simply did not get enough benefit from other more conservative therapies including, but not limited to: 1. Over-the-counter oral analgesic medications (i.e.: ibuprofen, naproxen, etc.) 2. Anti-inflammatory medications 3. Muscle relaxants 4. Membrane stabilizers 5. Opioids 6. Physical therapy (PT), chiropractic  manipulation, and/or home exercise program (HEP). 7. Modalities (Heat, ice, etc.) 8. Invasive techniques such as nerve blocks.  Chad Wilson has attained greater than 50% reduction in pain from at least two (2) diagnostic medial branch blocks conducted in separate occasions. For this reason, I believe it is medically necessary to proceed with Non-Pulsed Radiofrequency Ablation for the purpose of attempting to prolong the duration of the benefits seen with the diagnostic injections.  Post-Procedure Evaluation  Procedure: Diagnostic bilateral lumbar facet block #2 under fluoroscopic guidance and IV sedation Pre-procedure pain level:  7/10 Post-procedure: 0/10 (100% relief)  Sedation: Sedation provided.  Effectiveness during initial hour after procedure(Ultra-Short Term Relief): 100 %   Local anesthetic used: Long-acting (4-6 hours) Effectiveness: Defined as any analgesic benefit obtained secondary to the administration of local anesthetics. This carries significant diagnostic value as to the etiological location, or anatomical origin, of the pain. Duration of benefit is expected to coincide with the duration of the local anesthetic used.  Effectiveness during initial 4-6 hours after procedure(Short-Term Relief): 100 %   Long-term benefit: Defined as any relief past the pharmacologic duration of the local anesthetics.  Effectiveness past the initial 6 hours after procedure(Long-Term Relief): 50 %(ongoing)   Current benefits: Defined as benefit that persist at this time.   Analgesia:  50% improved Function: Chad Wilson reports improvement in function ROM: Chad Wilson reports improvement in ROM  Pharmacotherapy Assessment  Analgesic: Oxycodone IR 5 mg, 1 tab PO q 6 hrs (20 mg/day of oxycodone) MME/day: 30 mg/day.   Monitoring: Pharmacotherapy: No side-effects or adverse reactions reported. [place] PMP: PDMP reviewed during this encounter.       Compliance: No problems identified. Effectiveness:  Clinically acceptable. Plan: Refer to "POC".  UDS:  Summary  Date Value Ref Range Status  06/03/2018 FINAL  Final    Comment:    ==================================================================== TOXASSURE COMP DRUG ANALYSIS,UR ==================================================================== Test                             Result       Flag       Units Drug Present   Lorazepam                      219                     ng/mg creat    Source of lorazepam is a scheduled prescription medication.   Hydrocodone                    1871                    ng/mg creat   Hydromorphone                  463                     ng/mg creat   Dihydrocodeine                 252                     ng/mg creat   Norhydrocodone                 1617                    ng/mg creat    Sources of hydrocodone include scheduled prescription    medications. Hydromorphone, dihydrocodeine and norhydrocodone are  expected metabolites of hydrocodone. Hydromorphone and    dihydrocodeine are also available as scheduled prescription    medications.   Zolpidem                       PRESENT   Zolpidem Acid                  PRESENT    Zolpidem acid is an expected metabolite of zolpidem.   Sertraline                     PRESENT   Desmethylsertraline            PRESENT    Desmethylsertraline is an expected metabolite of sertraline.   Acetaminophen                  PRESENT ==================================================================== Test                      Result    Flag   Units      Ref Range   Creatinine              52               mg/dL      >=20 ==================================================================== Declared Medications:  Medication list was not provided. ==================================================================== For clinical consultation, please call [phone]. ====================================================================    Laboratory Chemistry  Profile (12 mo)  Renal: 06/03/2018: BUN/Creatinine Ratio 8 10/05/2018: BUN 17; Creatinine, Ser 1.07; GFR calc Af Amer >60; GFR calc non Af Amer >60  Hepatic: 10/05/2018: Albumin 4.0; ALT 56; AST 22 Other: 02/17/2018: Vitamin B-12 147; VITD 21.01 06/03/2018: CRP 1; Sed Rate 21 Note: Above Lab results reviewed.  Imaging  Last 90 days:  Dg Chest Portable 1 View  Result Date: 10/05/2018 CLINICAL DATA:  Tachycardia. EXAM: PORTABLE CHEST 1 VIEW COMPARISON:  07/08/2016 FINDINGS: Mediastinum hilar structures normal. Mild left base subsegmental atelectasis/scarring. Stable elevation left hemidiaphragm. No pleural effusion or pneumothorax. Prior cervical spine fusion. IMPRESSION: Mild left base subsegmental atelectasis/scarring and stable elevation left hemidiaphragm again noted. No acute abnormality identified. Electronically Signed   By: Marcello Moores  Register   On: 10/05/2018 10:37   Dg Pain Clinic C-arm 1-60 Min No Report  Result Date: 09/22/2018 Fluoro was used, but no Radiologist interpretation will be provided. Please refer to "NOTES" tab for provider progress note.  Dg Pain Clinic C-arm 1-60 Min No Report  Result Date: 09/14/2018 Fluoro was used, but no Radiologist interpretation will be provided. Please refer to "NOTES" tab for provider progress note.  US Abdomen Limited Ruq  Result Date: 10/05/2018 CLINICAL DATA:  Nausea, vomiting, chronic hepatitis-C EXAM: ULTRASOUND ABDOMEN LIMITED RIGHT UPPER QUADRANT COMPARISON:  None. FINDINGS: Gallbladder: No gallstones or wall thickening visualized. No sonographic Murphy sign noted by sonographer. Common bile duct: Diameter: Normal caliber, 5 mm Liver: Increased echotexture compatible with fatty infiltration or intrinsic liver disease. No focal abnormality or biliary ductal dilatation. Portal vein is patent on color Doppler imaging with normal direction of blood flow towards the liver. IMPRESSION: Increased echotexture throughout the liver could reflect fatty  infiltration or intrinsic liver disease. No focal abnormality. Electronically Signed   By: Rolm Baptise M.D.   On: 10/05/2018 11:40   Last Hospital Admission:  Dg Chest Portable 1 View  Result Date: 10/05/2018 CLINICAL DATA:  Tachycardia. EXAM: PORTABLE CHEST 1 VIEW COMPARISON:  07/08/2016 FINDINGS: Mediastinum hilar structures normal. Mild left base subsegmental  atelectasis/scarring. Stable elevation left hemidiaphragm. No pleural effusion or pneumothorax. Prior cervical spine fusion. IMPRESSION: Mild left base subsegmental atelectasis/scarring and stable elevation left hemidiaphragm again noted. No acute abnormality identified. Electronically Signed   By: Marcello Moores  Register   On: 10/05/2018 10:37   US Abdomen Limited Ruq  Result Date: 10/05/2018 CLINICAL DATA:  Nausea, vomiting, chronic hepatitis-C EXAM: ULTRASOUND ABDOMEN LIMITED RIGHT UPPER QUADRANT COMPARISON:  None. FINDINGS: Gallbladder: No gallstones or wall thickening visualized. No sonographic Murphy sign noted by sonographer. Common bile duct: Diameter: Normal caliber, 5 mm Liver: Increased echotexture compatible with fatty infiltration or intrinsic liver disease. No focal abnormality or biliary ductal dilatation. Portal vein is patent on color Doppler imaging with normal direction of blood flow towards the liver. IMPRESSION: Increased echotexture throughout the liver could reflect fatty infiltration or intrinsic liver disease. No focal abnormality. Electronically Signed   By: Rolm Baptise M.D.   On: 10/05/2018 11:40   Assessment  The primary encounter diagnosis was Chronic pain syndrome. Diagnoses of Chronic low back pain (Primary area of Pain) (Bilateral) (R=L) w/o sciatica, Lumbar facet syndrome (Bilateral), Neurogenic pain, and Grade 1 Lumbosacral Retrolisthesis of L5/S1 were also pertinent to this visit.  Plan of Care  I have changed Chad Olano's oxyCODONE. I am also having him start on pregabalin. Additionally, I am having him maintain  his Depo-Testosterone, Vitamin D3, albuterol, levothyroxine, clonazePAM, sildenafil, busPIRone, promethazine, lisinopril, and atorvastatin.  Pharmacotherapy (Medications Ordered): Meds ordered this encounter  Medications  . oxyCODONE (OXY IR/ROXICODONE) 5 MG immediate release tablet    Sig: Take 1 tablet (5 mg total) by mouth every 6 (six) hours as needed for severe pain. Avoid clonazepam taking within 8 hrs    Dispense:  120 tablet    Refill:  0    Chronic Pain: STOP Act - Not applicable. Fill 1 day early if closed on scheduled refill date. Do not fill until: 10/27/2018. To last until: 11/26/2018. Instruct to avoid benzodiazepines within 8 hours of opioids.  . pregabalin (LYRICA) 25 MG capsule    Sig: Take 1 capsule (25 mg total) by mouth 2 (two) times daily for 14 days, THEN 1 capsule (25 mg total) 3 (three) times daily for 16 days.    Dispense:  76 capsule    Refill:  0    Fill one day early if pharmacy is closed on scheduled refill date. May substitute for generic if available.   Orders:  Orders Placed This Encounter  Procedures  . Radiofrequency,Lumbar    Standing Status:   Future    Standing Expiration Date:   04/22/2020    Scheduling Instructions:     Side(s): Left-sided     Level: L3-4, L4-5, & L5-S1 Facets (L2, L3, L4, L5, & S1 Medial Branch Nerves)     Sedation: With Sedation     Scheduling Timeframe: As soon as pre-approved    Order Specific Question:   Where will this procedure be performed?    Answer:   [facility]   Follow-up plan:   Return in about 5 weeks (around 11/25/2018) for (VV), E/M (MM), in addition, RFA: (L) L-FCT RFA #1, (w/ Sedation), (ASAP).      Considering:   Possible bilateral lumbar facet RFA    PRN Procedures:   Palliative/therapeutic bilateral lumbar facet nerve block #3      Recent Visits Date Type Provider Dept  09/22/18 Procedure visit Milinda Pointer, [facility]  09/16/18 Office Visit Milinda Pointer, MD [facility]  09/07/18 Office Visit Milinda Pointer, MD [facility]  08/25/18 Procedure visit Milinda Pointer, MD [facility]  08/17/18 Office Visit Milinda Pointer, MD [facility]  Showing recent visits within past 90 days and meeting all other requirements   Today's Visits Date Type Provider Dept  10/21/18 Office Visit Milinda Pointer, MD [facility]  Showing today's visits and meeting all other requirements   Future Appointments No visits were found meeting these conditions.  Showing future appointments within next 90 days and meeting all other requirements   I discussed the assessment and treatment plan with the patient. The patient was provided an opportunity to ask questions and all were answered. The patient agreed with the plan and demonstrated an understanding of the instructions.  Patient advised to call back or seek an in-person evaluation if the symptoms or condition worsens.  Total duration of non-face-to-face encounter: 15 minutes.  Note by: Chad Cola, MD Date: 10/21/2018; Time: 6:50 PM  Note: This dictation was prepared with Dragon dictation. Any transcriptional errors that may result from this process are unintentional.  Disclaimer:  * Given the special circumstances of the COVID-19 pandemic, the federal government has announced that the Office for Civil Rights (OCR) will exercise its enforcement discretion and will not impose penalties on physicians using telehealth in the event of noncompliance with regulatory requirements under the [place] and [place] (HIPAA) in connection with the good faith provision of telehealth during the DUKRC-38 national public health emergency. ([place])

## 2018-10-21 ENCOUNTER — Ambulatory Visit: Payer: 59 | Attending: Pain Medicine | Admitting: Pain Medicine

## 2018-10-21 ENCOUNTER — Other Ambulatory Visit: Payer: Self-pay

## 2018-10-21 DIAGNOSIS — G894 Chronic pain syndrome: Secondary | ICD-10-CM | POA: Diagnosis not present

## 2018-10-21 DIAGNOSIS — M545 Low back pain, unspecified: Secondary | ICD-10-CM

## 2018-10-21 DIAGNOSIS — M4310 Spondylolisthesis, site unspecified: Secondary | ICD-10-CM | POA: Diagnosis not present

## 2018-10-21 DIAGNOSIS — M47816 Spondylosis without myelopathy or radiculopathy, lumbar region: Secondary | ICD-10-CM | POA: Diagnosis not present

## 2018-10-21 DIAGNOSIS — G8929 Other chronic pain: Secondary | ICD-10-CM | POA: Diagnosis not present

## 2018-10-21 DIAGNOSIS — M792 Neuralgia and neuritis, unspecified: Secondary | ICD-10-CM | POA: Diagnosis not present

## 2018-10-21 MED ORDER — PREGABALIN 25 MG PO CAPS: ORAL_CAPSULE | ORAL | 0 refills | Status: DC

## 2018-10-21 MED ORDER — OXYCODONE HCL 5 MG PO TABS: 5.0000 mg | ORAL_TABLET | Freq: Four times a day (QID) | ORAL | 0 refills | Status: DC | PRN

## 2018-10-21 NOTE — Patient Instructions (Signed)

## 2018-10-22 MED FILL — PREGABALIN 25 MG CAPS: 25 | 30 days supply | Qty: 76 | Fill #0

## 2018-10-27 MED FILL — oxyCODONE HCL 5 MG TABS: 5 | 30 days supply | Qty: 120 | Fill #0

## 2018-11-02 MED FILL — TESTOSTERONE CYP 200 MG/ML: 200 | 84 days supply | Qty: 6 | Fill #0

## 2018-11-06 ENCOUNTER — Other Ambulatory Visit: Payer: Self-pay | Admitting: Adult Health

## 2018-11-06 DIAGNOSIS — F419 Anxiety disorder, unspecified: Secondary | ICD-10-CM

## 2018-11-06 MED FILL — clonazePAM 0.5 MG TABS: 0.5 | 15 days supply | Qty: 30 | Fill #0

## 2018-11-13 ENCOUNTER — Encounter: Payer: Self-pay | Admitting: Pain Medicine

## 2018-11-24 ENCOUNTER — Telehealth: Payer: Self-pay

## 2018-11-24 NOTE — Telephone Encounter (Signed)
He is going out of town for work on Thursday and wants to know if he can get his medicine filled a couple of days early so he wont run out of it while he is out of town working.

## 2018-11-25 ENCOUNTER — Ambulatory Visit
Admission: RE | Admit: 2018-11-25 | Discharge: 2018-11-25 | Disposition: A | Discharge status: Home / Self Care | Payer: 59 | Source: Ambulatory Visit | Attending: Adult Health | Admitting: Adult Health

## 2018-11-25 ENCOUNTER — Other Ambulatory Visit: Payer: Self-pay

## 2018-11-25 DIAGNOSIS — R140 Abdominal distension (gaseous): Secondary | ICD-10-CM | POA: Diagnosis not present

## 2018-11-25 DIAGNOSIS — R932 Abnormal findings on diagnostic imaging of liver and biliary tract: Secondary | ICD-10-CM

## 2018-11-25 MED ORDER — IOPAMIDOL (ISOVUE-300) INJECTION 61%
100.0000 mL | Freq: Once | INTRAVENOUS | Status: AC | PRN
  Administered 2018-11-25: 09:00:00 100 mL via INTRAVENOUS

## 2018-11-25 NOTE — Telephone Encounter (Signed)
Last script for Oxycodone TLU 11-26-18. Explained that no scripts for opioids will be written outside of an appt. States he can make it last until next Monday. Will schedule MR for Monday.  Please call patient to schedule VV MR appt with Dr. Dossie Arbour on Monday.

## 2018-11-25 NOTE — Telephone Encounter (Signed)
Attempted to call patient, message left.

## 2018-11-26 ENCOUNTER — Encounter: Payer: Self-pay | Admitting: Pain Medicine

## 2018-11-29 NOTE — Progress Notes (Signed)
Pain Management Virtual Encounter Note - Virtual Visit via Telephone Telehealth (real-time audio visits between healthcare provider and patient).   Patient's Phone No. & Preferred Pharmacy:  [phone] (home); [phone] (mobile); (Preferred) [phone] [email or web address]  [place], [place]. [address] [place] [address] Phone: [phone] Fax: [phone]    Pre-screening note:  Our staff contacted Mr. Whitner and offered him an "in person", "face-to-face" appointment versus a telephone encounter. He indicated preferring the telephone encounter, at this time.   Reason for Virtual Visit: COVID-19*  Social distancing based on CDC and AMA recommendations.   I contacted Mali Bosko on 11/30/2018 via telephone.      I clearly identified myself as [name], MD. I verified that I was speaking with the correct person using two identifiers (Name: Mali Vandenberghe, and date of birth: 09-Jun-1975).  Advanced Informed Consent I sought verbal advanced consent from Mali Benett for virtual visit interactions. I informed Mr. Mcguiness of possible security and privacy concerns, risks, and limitations associated with providing "not-in-person" medical evaluation and management services. I also informed Mr. Shaff of the availability of "in-person" appointments. Finally, I informed him that there would be a charge for the virtual visit and that he could be  personally, fully or partially, financially responsible for it. Mr. Perrell expressed understanding and agreed to proceed.   Historic Elements   Mr. Mali Windom is a 43 y.o. year old, male patient evaluated today after his last encounter by our practice on 11/24/2018. Mr. Dority  has a past medical history of Acute hepatitis C without mention of hepatic coma(070.51), Alcohol abuse, unspecified, Anxiety and depression, Anxiety states, Essential hypertension, benign, Graves disease, Panic  disorder without agoraphobia, Thyroid disease, and Tobacco use disorder. He also  has a past surgical history that includes Multiple tooth extractions (15 yrs. ago) and Anterior cervical decomp/discectomy fusion (N/A, 05/21/2017). Mr. Mozer has a current medication list which includes the following prescription(s): albuterol, atorvastatin, vitamin d3, clonazepam, depo-testosterone, levothyroxine, lisinopril, oxycodone, oxycodone, promethazine, sildenafil, pregabalin, pregabalin, pregabalin, pregabalin, pregabalin, and omeprazole. He  reports that he has been smoking cigarettes. He has a 30.00 pack-year smoking history. His smokeless tobacco use includes chew. He reports current alcohol use of about 42.0 standard drinks of alcohol per week. He reports previous drug use. Mr. Seybold is allergic to chantix [varenicline].   HPI  Today, he is being contacted for medication management. Today we evaluated the Lyrica titration.  He seems to be tolerating the medication well and therefore we will continue to titrate up as tolerated.  Today I have sent prescriptions to his pharmacy to continue the titration up.  Were pending a left lumbar facet RFA on 12/10/2018.  We will plan on doing the right side about 2 weeks later.  Pharmacotherapy Assessment  Analgesic: Oxycodone IR 5 mg, 1 tab PO q 6 hrs (20 mg/day of oxycodone) MME/day: 30 mg/day.   Monitoring: Pharmacotherapy: No side-effects or adverse reactions reported. [place] PMP: PDMP reviewed during this encounter.       Compliance: No problems identified. Effectiveness: Clinically acceptable. Plan: Refer to "POC".  UDS:  Summary  Date Value Ref Range Status  06/03/2018 FINAL  Final    Comment:    ==================================================================== TOXASSURE COMP DRUG ANALYSIS,UR ==================================================================== Test                             Result  Flag       Units Drug Present   Lorazepam                       219                     ng/mg creat    Source of lorazepam is a scheduled prescription medication.   Hydrocodone                    1871                    ng/mg creat   Hydromorphone                  463                     ng/mg creat   Dihydrocodeine                 252                     ng/mg creat   Norhydrocodone                 1617                    ng/mg creat    Sources of hydrocodone include scheduled prescription    medications. Hydromorphone, dihydrocodeine and norhydrocodone are    expected metabolites of hydrocodone. Hydromorphone and    dihydrocodeine are also available as scheduled prescription    medications.   Zolpidem                       PRESENT   Zolpidem Acid                  PRESENT    Zolpidem acid is an expected metabolite of zolpidem.   Sertraline                     PRESENT   Desmethylsertraline            PRESENT    Desmethylsertraline is an expected metabolite of sertraline.   Acetaminophen                  PRESENT ==================================================================== Test                      Result    Flag   Units      Ref Range   Creatinine              52               mg/dL      >=20 ==================================================================== Declared Medications:  Medication list was not provided. ==================================================================== For clinical consultation, please call [phone]. ====================================================================    Laboratory Chemistry Profile (12 mo)  Renal: 06/03/2018: BUN/Creatinine Ratio 8 10/05/2018: BUN 17; Creatinine, Ser 1.07  Lab Results  Component Value Date   GFRAA >60 10/05/2018   GFRNONAA >60 10/05/2018   Hepatic: 10/05/2018: Albumin 4.0 Lab Results  Component Value Date   AST 22 10/05/2018   ALT 56 (H) 10/05/2018   Other: 02/17/2018: Vitamin B-12 147; VITD 21.01 06/03/2018: CRP 1; Sed Rate 21 Note: Above Lab results  reviewed.  Imaging  Last 90 days:  Ct Abdomen Pelvis W Contrast  Result Date: 11/25/2018  CLINICAL DATA:  Abdominal distension and pain EXAM: CT ABDOMEN AND PELVIS WITH CONTRAST TECHNIQUE: Multidetector CT imaging of the abdomen and pelvis was performed using the standard protocol following bolus administration of intravenous contrast. Oral contrast was also administered. CONTRAST:  162m ISOVUE-300 IOPAMIDOL (ISOVUE-300) INJECTION 61% COMPARISON:  Ultrasound right upper quadrant October 05, 2018 FINDINGS: Lower chest: There is atelectatic change in the left base region. There is no lung base edema or consolidation. Hepatobiliary: Liver measures 22.4 cm in length. No focal liver lesions are demonstrable by CT. Gallbladder wall is not appreciably thickened. There is no evident biliary duct dilatation. Pancreas: There is no pancreatic mass or inflammatory focus. Spleen: No splenic lesions are evident. Adrenals/Urinary Tract: Adrenals bilaterally appear unremarkable. Kidneys bilaterally show no evident mass or hydronephrosis on either side. There is no evident renal or ureteral calculus on either side. Urinary bladder is midline with wall thickness within normal limits. Stomach/Bowel: There is mild thickening of the wall of the mid to distal sigmoid colon and upper rectum. There is no diverticulitis in this area. No associated mesenteric thickening. Elsewhere, there is no bowel wall or mesenteric thickening. No evident bowel obstruction. The terminal ileum appears unremarkable. There is lipomatous infiltration of the ileocecal valve. No free air or portal venous air evident. Vascular/Lymphatic: No abdominal aortic aneurysm. There is aortic and iliac artery atherosclerosis. Major mesenteric arterial vessels are patent. There is no evident adenopathy in the abdomen or pelvis. Reproductive: Prostate and seminal vesicles are normal in size and contour. No pelvic mass evident. Other: The appendix appears normal. There is  no abscess or ascites in the abdomen or pelvis. Musculoskeletal: No blastic or lytic bone lesions. No intramuscular or abdominal wall lesions evident. IMPRESSION: 1. Wall thickening in portions of the distal sigmoid and upper rectum consistent with distal colitis. There may be mild proctitis in the upper rectum as well. There is no pericolonic or perirectal soft tissue stranding or fluid. No abscess in these areas. No bowel wall thickening elsewhere. No bowel obstruction evident. 2.  No abscess in the abdomen or pelvis.  Appendix appears normal. 3. No evident renal or ureteral calculus. No hydronephrosis. Urinary bladder wall thickness normal. 4.  Prominent liver without focal liver lesion evident by CT. 5.  Aortoiliac atherosclerosis. Electronically Signed   By: WLowella GripIII M.D.   On: 11/25/2018 14:04   Dg Chest Portable 1 View  Result Date: 10/05/2018 CLINICAL DATA:  Tachycardia. EXAM: PORTABLE CHEST 1 VIEW COMPARISON:  07/08/2016 FINDINGS: Mediastinum hilar structures normal. Mild left base subsegmental atelectasis/scarring. Stable elevation left hemidiaphragm. No pleural effusion or pneumothorax. Prior cervical spine fusion. IMPRESSION: Mild left base subsegmental atelectasis/scarring and stable elevation left hemidiaphragm again noted. No acute abnormality identified. Electronically Signed   By: TMarcello Moores Register   On: 10/05/2018 10:37   Dg [facility] C-arm 1-60 Min No Report  Result Date: 09/22/2018 Fluoro was used, but no Radiologist interpretation will be provided. Please refer to "NOTES" tab for provider progress note.  UKoreaAbdomen Limited Ruq  Result Date: 10/05/2018 CLINICAL DATA:  Nausea, vomiting, chronic hepatitis-C EXAM: ULTRASOUND ABDOMEN LIMITED RIGHT UPPER QUADRANT COMPARISON:  None. FINDINGS: Gallbladder: No gallstones or wall thickening visualized. No sonographic Murphy sign noted by sonographer. Common bile duct: Diameter: Normal caliber, 5 mm Liver: Increased echotexture  compatible with fatty infiltration or intrinsic liver disease. No focal abnormality or biliary ductal dilatation. Portal vein is patent on color Doppler imaging with normal direction of blood flow towards the liver. IMPRESSION:  Increased echotexture throughout the liver could reflect fatty infiltration or intrinsic liver disease. No focal abnormality. Electronically Signed   By: Rolm Baptise M.D.   On: 10/05/2018 11:40    Assessment  The primary encounter diagnosis was Chronic pain syndrome. Diagnoses of Chronic low back pain (Primary area of Pain) (Bilateral) (R=L) w/o sciatica, Lumbar facet syndrome (Bilateral), Cervicalgia, and Neurogenic pain were also pertinent to this visit.  Plan of Care  I have changed Mali Nicklaus's pregabalin and oxyCODONE. I am also having him start on oxyCODONE, pregabalin, pregabalin, pregabalin, and pregabalin. Additionally, I am having him maintain his Depo-Testosterone, Vitamin D3, albuterol, levothyroxine, sildenafil, promethazine, lisinopril, atorvastatin, and clonazePAM.  Pharmacotherapy (Medications Ordered): Meds ordered this encounter  Medications  . pregabalin (LYRICA) 50 MG capsule    Sig: Take 1 capsule (50 mg total) by mouth 2 (two) times daily for 14 days.    Dispense:  28 capsule    Refill:  0    Fill one day early if pharmacy is closed on scheduled refill date. May substitute for generic if available.  Marland Kitchen oxyCODONE (OXY IR/ROXICODONE) 5 MG immediate release tablet    Sig: Take 1 tablet (5 mg total) by mouth every 6 (six) hours as needed for severe pain. Must last 30 days    Dispense:  120 tablet    Refill:  0    Chronic Pain: STOP Act (Not applicable) Fill 1 day early if closed on refill date. Do not fill until: 11/30/2018. To last until: 12/30/2018. Avoid benzodiazepines within 8 hours of opioids  . oxyCODONE (OXY IR/ROXICODONE) 5 MG immediate release tablet    Sig: Take 1 tablet (5 mg total) by mouth every 6 (six) hours as needed for severe pain. Must  last 30 days    Dispense:  120 tablet    Refill:  0    Chronic Pain: STOP Act (Not applicable) Fill 1 day early if closed on refill date. Do not fill until: 12/30/2018. To last until: 01/29/2019. Avoid benzodiazepines within 8 hours of opioids  . pregabalin (LYRICA) 50 MG capsule    Sig: Take 1 capsule (50 mg total) by mouth 3 (three) times daily for 14 days.    Dispense:  42 capsule    Refill:  0    Fill one day early if pharmacy is closed on scheduled refill date. May substitute for generic if available.  . pregabalin (LYRICA) 75 MG capsule    Sig: Take 1 capsule (75 mg total) by mouth 3 (three) times daily for 14 days.    Dispense:  42 capsule    Refill:  0    Fill one day early if pharmacy is closed on scheduled refill date. May substitute for generic if available.  . pregabalin (LYRICA) 100 MG capsule    Sig: Take 1 capsule (100 mg total) by mouth 3 (three) times daily for 14 days.    Dispense:  42 capsule    Refill:  0    Fill one day early if pharmacy is closed on scheduled refill date. May substitute for generic if available.  . pregabalin (LYRICA) 150 MG capsule    Sig: Take 1 capsule (150 mg total) by mouth 3 (three) times daily for 14 days.    Dispense:  42 capsule    Refill:  0    Fill one day early if pharmacy is closed on scheduled refill date. May substitute for generic if available.   Orders:  No orders of the defined types  were placed in this encounter.  Follow-up plan:   Return in about 8 weeks (around 01/27/2019) for (VV), E/M, (MM) to evaluate Lyrica titration, in addition, (L) L-FCT RFA #1.      Considering:   Therapeutic bilateral lumbar facet RFA  (we will start on the left side) (scheduled for 12/10/2018)   PRN Procedures:   Palliative/therapeutic bilateral lumbar facet block #3     Recent Visits Date Type Provider Dept  10/21/18 Office Visit Milinda Pointer, MD [facility]  09/22/18 Procedure visit Milinda Pointer, MD [facility]  09/16/18 Office Visit Milinda Pointer, MD [facility]  09/07/18 Office Visit Milinda Pointer, MD [facility]  Showing recent visits within past 90 days and meeting all other requirements   Today's Visits Date Type Provider Dept  11/30/18 Office Visit Milinda Pointer, MD [facility]  Showing today's visits and meeting all other requirements   Future Appointments Date Type Provider Dept  12/10/18 Appointment Milinda Pointer, MD [facility]  01/27/19 Appointment Milinda Pointer, MD [facility]  Showing future appointments within next 90 days and meeting all other requirements   I discussed the assessment and treatment plan with the patient. The patient was provided an opportunity to ask questions and all were answered. The patient agreed with the plan and demonstrated an understanding of the instructions.  Patient advised to call back or seek an in-person evaluation if the symptoms or condition worsens.  Total duration of non-face-to-face encounter: 13 minutes.  Note by: [name], MD Date: 11/30/2018; Time: 3:13 PM  Note: This dictation was prepared with Dragon dictation. Any transcriptional errors that may result from this process are unintentional.  Disclaimer:  * Given the special circumstances of the COVID-19 pandemic, the federal government has announced that the Office for Civil Rights (OCR) will exercise its enforcement discretion and will not impose penalties on physicians using telehealth in the event of noncompliance with regulatory requirements under the [place] (HIPAA) in connection with the good faith provision of telehealth during the NWGNF-62 national public health emergency. ([place])

## 2018-11-30 ENCOUNTER — Other Ambulatory Visit: Payer: Self-pay

## 2018-11-30 ENCOUNTER — Encounter: Payer: Self-pay | Admitting: Adult Health

## 2018-11-30 ENCOUNTER — Ambulatory Visit: Payer: 59 | Attending: Pain Medicine | Admitting: Pain Medicine

## 2018-11-30 DIAGNOSIS — M542 Cervicalgia: Secondary | ICD-10-CM | POA: Diagnosis not present

## 2018-11-30 DIAGNOSIS — M792 Neuralgia and neuritis, unspecified: Secondary | ICD-10-CM | POA: Diagnosis not present

## 2018-11-30 DIAGNOSIS — G8929 Other chronic pain: Secondary | ICD-10-CM

## 2018-11-30 DIAGNOSIS — G894 Chronic pain syndrome: Secondary | ICD-10-CM

## 2018-11-30 DIAGNOSIS — M47816 Spondylosis without myelopathy or radiculopathy, lumbar region: Secondary | ICD-10-CM

## 2018-11-30 DIAGNOSIS — M545 Low back pain: Secondary | ICD-10-CM | POA: Diagnosis not present

## 2018-11-30 DIAGNOSIS — K51919 Ulcerative colitis, unspecified with unspecified complications: Secondary | ICD-10-CM

## 2018-11-30 MED ORDER — PREGABALIN 50 MG PO CAPS: 50.0000 mg | ORAL_CAPSULE | Freq: Three times a day (TID) | ORAL | 0 refills | Status: DC

## 2018-11-30 MED ORDER — PREGABALIN 50 MG PO CAPS: 50.0000 mg | ORAL_CAPSULE | Freq: Two times a day (BID) | ORAL | 0 refills | Status: DC

## 2018-11-30 MED ORDER — OXYCODONE HCL 5 MG PO TABS: 5.0000 mg | ORAL_TABLET | Freq: Four times a day (QID) | ORAL | 0 refills | Status: DC | PRN

## 2018-11-30 MED ORDER — PREGABALIN 100 MG PO CAPS: 100.0000 mg | ORAL_CAPSULE | Freq: Three times a day (TID) | ORAL | 0 refills | Status: DC

## 2018-11-30 MED ORDER — PREGABALIN 75 MG PO CAPS: 75.0000 mg | ORAL_CAPSULE | Freq: Three times a day (TID) | ORAL | 0 refills | Status: DC

## 2018-11-30 MED ORDER — PREGABALIN 150 MG PO CAPS: 150.0000 mg | ORAL_CAPSULE | Freq: Three times a day (TID) | ORAL | 0 refills | Status: DC

## 2018-11-30 MED FILL — PREGABALIN 50 MG CAPS: 50 | 14 days supply | Qty: 28 | Fill #0

## 2018-11-30 MED FILL — oxyCODONE HCL 5 MG TABS: 5 | 30 days supply | Qty: 120 | Fill #0

## 2018-11-30 NOTE — Patient Instructions (Signed)

## 2018-12-01 NOTE — Telephone Encounter (Signed)
Updated patient on CT scan. Will refer to GI for further evaluation. Advised to cut back on alcohol use   1. Wall thickening in portions of the distal sigmoid and upper rectum consistent with distal colitis. There may be mild proctitis in the upper rectum as well. There is no pericolonic or perirectal soft tissue stranding or fluid. No abscess in these areas. No bowel wall thickening elsewhere. No bowel obstruction evident.  2.  No abscess in the abdomen or pelvis.  Appendix appears normal.  3. No evident renal or ureteral calculus. No hydronephrosis. Urinary bladder wall thickness normal.  4.  Prominent liver without focal liver lesion evident by CT.

## 2018-12-02 ENCOUNTER — Encounter: Payer: Self-pay | Admitting: Gastroenterology

## 2018-12-03 ENCOUNTER — Ambulatory Visit: Payer: 59 | Admitting: Pain Medicine

## 2018-12-10 ENCOUNTER — Other Ambulatory Visit: Payer: Self-pay

## 2018-12-10 ENCOUNTER — Ambulatory Visit (HOSPITAL_BASED_OUTPATIENT_CLINIC_OR_DEPARTMENT_OTHER): Payer: 59 | Admitting: Pain Medicine

## 2018-12-10 ENCOUNTER — Ambulatory Visit
Admission: RE | Admit: 2018-12-10 | Discharge: 2018-12-10 | Disposition: A | Discharge status: Home / Self Care | Payer: 59 | Source: Ambulatory Visit | Attending: Pain Medicine | Admitting: Pain Medicine

## 2018-12-10 ENCOUNTER — Telehealth: Payer: Self-pay

## 2018-12-10 ENCOUNTER — Encounter: Payer: Self-pay | Admitting: Pain Medicine

## 2018-12-10 VITALS — BP 150/92 | HR 75 | Temp 98.1°F | Resp 18 | Ht 66.0 in | Wt 176.0 lb

## 2018-12-10 DIAGNOSIS — G8918 Other acute postprocedural pain: Secondary | ICD-10-CM | POA: Insufficient documentation

## 2018-12-10 DIAGNOSIS — M545 Low back pain: Secondary | ICD-10-CM | POA: Diagnosis not present

## 2018-12-10 DIAGNOSIS — G8929 Other chronic pain: Secondary | ICD-10-CM | POA: Diagnosis not present

## 2018-12-10 DIAGNOSIS — M47816 Spondylosis without myelopathy or radiculopathy, lumbar region: Secondary | ICD-10-CM | POA: Diagnosis not present

## 2018-12-10 DIAGNOSIS — M5137 Other intervertebral disc degeneration, lumbosacral region: Secondary | ICD-10-CM | POA: Insufficient documentation

## 2018-12-10 DIAGNOSIS — M47817 Spondylosis without myelopathy or radiculopathy, lumbosacral region: Secondary | ICD-10-CM

## 2018-12-10 DIAGNOSIS — M4310 Spondylolisthesis, site unspecified: Secondary | ICD-10-CM | POA: Insufficient documentation

## 2018-12-10 HISTORY — DX: Other acute postprocedural pain: G89.18

## 2018-12-10 MED ORDER — ROPIVACAINE HCL 2 MG/ML IJ SOLN
9.0000 mL | Freq: Once | INTRAMUSCULAR | Status: AC
  Administered 2018-12-10: 9 mL via PERINEURAL

## 2018-12-10 MED ORDER — FENTANYL CITRATE (PF) 100 MCG/2ML IJ SOLN
INTRAMUSCULAR | Status: AC
  Filled 2018-12-10: qty 2

## 2018-12-10 MED ORDER — HYDROCODONE-ACETAMINOPHEN 5-325 MG PO TABS: 1.0000 | ORAL_TABLET | Freq: Three times a day (TID) | ORAL | 0 refills | Status: DC | PRN

## 2018-12-10 MED ORDER — MIDAZOLAM HCL 5 MG/5ML IJ SOLN
INTRAMUSCULAR | Status: AC
  Filled 2018-12-10: qty 5

## 2018-12-10 MED ORDER — MIDAZOLAM HCL 5 MG/5ML IJ SOLN
1.0000 mg | INTRAMUSCULAR | Status: DC | PRN
  Administered 2018-12-10: 11:00:00 3 mg via INTRAVENOUS

## 2018-12-10 MED ORDER — LIDOCAINE HCL 2 % IJ SOLN
20.0000 mL | Freq: Once | INTRAMUSCULAR | Status: AC
  Administered 2018-12-10: 11:00:00 400 mg

## 2018-12-10 MED ORDER — LIDOCAINE HCL 2 % IJ SOLN
INTRAMUSCULAR | Status: AC
  Filled 2018-12-10: qty 20

## 2018-12-10 MED ORDER — LACTATED RINGERS IV SOLN
1000.0000 mL | Freq: Once | INTRAVENOUS | Status: AC
  Administered 2018-12-10: 1000 mL via INTRAVENOUS

## 2018-12-10 MED ORDER — FENTANYL CITRATE (PF) 100 MCG/2ML IJ SOLN
25.0000 ug | INTRAMUSCULAR | Status: DC | PRN
  Administered 2018-12-10: 11:00:00 100 ug via INTRAVENOUS

## 2018-12-10 MED ORDER — ROPIVACAINE HCL 2 MG/ML IJ SOLN
INTRAMUSCULAR | Status: AC
  Filled 2018-12-10: qty 10

## 2018-12-10 MED ORDER — TRIAMCINOLONE ACETONIDE 40 MG/ML IJ SUSP
INTRAMUSCULAR | Status: AC
  Filled 2018-12-10: qty 1

## 2018-12-10 MED ORDER — TRIAMCINOLONE ACETONIDE 40 MG/ML IJ SUSP
40.0000 mg | Freq: Once | INTRAMUSCULAR | Status: AC
  Administered 2018-12-10: 11:00:00 40 mg

## 2018-12-10 MED FILL — HYDROCODON-APAP 5-325: 5-325 | 7 days supply | Qty: 21 | Fill #0

## 2018-12-10 NOTE — Progress Notes (Signed)
Safety precautions to be maintained throughout the outpatient stay will include: orient to surroundings, keep bed in low position, maintain call bell within reach at all times, provide assistance with transfer out of bed and ambulation.  

## 2018-12-10 NOTE — Patient Instructions (Signed)
___________________________________________________________________________________________  Post-Radiofrequency (RF) Discharge Instructions  You have just completed a Radiofrequency Neurotomy.  The following instructions will provide you with information and guidelines for self-care upon discharge.  If at any time you have questions or concerns please call your physician. DO NOT DRIVE YOURSELF!!  Instructions:  Apply ice: Fill a plastic sandwich bag with crushed ice. Cover it with a small towel and apply to injection site. Apply for 15 minutes then remove x 15 minutes. Repeat sequence on day of procedure, until you go to bed. The purpose is to minimize swelling and discomfort after procedure.  Apply heat: Apply heat to procedure site starting the day following the procedure. The purpose is to treat any soreness and discomfort from the procedure.  Food intake: No eating limitations, unless stipulated above.  Nevertheless, if you have had sedation, you may experience some nausea.  In this case, it may be wise to wait at least two hours prior to resuming regular diet.  Physical activities: Keep activities to a minimum for the first 8 hours after the procedure. For the first 24 hours after the procedure, do not drive a motor vehicle,  Operate heavy machinery, power tools, or handle any weapons.  Consider walking with the use of an assistive device or accompanied by an adult for the first 24 hours.  Do not drink alcoholic beverages including beer.  Do not make any important decisions or sign any legal documents. Go home and rest today.  Resume activities tomorrow, as tolerated.  Use caution in moving about as you may experience mild leg weakness.  Use caution in cooking, use of household electrical appliances and climbing steps.  Driving: If you have received any sedation, you are not allowed to drive for 24 hours after your procedure.  Blood thinner: Restart your blood thinner 6 hours after your  procedure. (Only for those taking blood thinners)  Insulin: As soon as you can eat, you may resume your normal dosing schedule. (Only for those taking insulin)  Medications: May resume pre-procedure medications.  Do not take any drugs, other than what has been prescribed to you.  Infection prevention: Keep procedure site clean and dry.  Post-procedure Pain Diary: Extremely important that this be done correctly and accurately. Recorded information will be used to determine the next step in treatment.  Pain evaluated is that of treated area only. Do not include pain from an untreated area.  Complete every hour, on the hour, for the initial 8 hours. Set an alarm to help you do this part accurately.  Do not go to sleep and have it completed later. It will not be accurate.  Follow-up appointment: Keep your follow-up appointment after the procedure. Usually 2 weeks for most procedures. (6 weeks in the case of radiofrequency.) Bring you pain diary.   Expect:  From numbing medicine (AKA: Local Anesthetics): Numbness or decrease in pain.  Onset: Full effect within 15 minutes of injected.  Duration: It will depend on the type of local anesthetic used. On the average, 1 to 8 hours.   From steroids: Decrease in swelling or inflammation. Once inflammation is improved, relief of the pain will follow.  Onset of benefits: Depends on the amount of swelling present. The more swelling, the longer it will take for the benefits to be seen. In some cases, up to 10 days.  Duration: Steroids will stay in the system x 2 weeks. Duration of benefits will depend on multiple posibilities including persistent irritating factors.  From procedure: Some  discomfort is to be expected once the numbing medicine wears off. This should be minimal if ice and heat are applied as instructed.  Call if:  You experience numbness and weakness that gets worse with time, as opposed to wearing off.  He experience any unusual  bleeding, difficulty breathing, or loss of the ability to control your bowel and bladder. (This applies to Spinal procedures only)  You experience any redness, swelling, heat, red streaks, elevated temperature, fever, or any other signs of a possible infection.  Emergency Numbers:  [place] business hours (Monday - Thursday, 8:00 AM - 4:00 PM) (Friday, 9:00 AM - 12:00 Noon): [phone]  After hours: [phone] ____________________________________________________________________________________________

## 2018-12-10 NOTE — Progress Notes (Signed)
Patient's Name: Chad Wilson  MRN: [id number]  Referring Provider: Milinda Pointer, MD  DOB: [birth year]  PCP: Dorothyann Peng, NP  DOS: 12/10/2018  Note by: [name], MD  Service setting: Ambulatory outpatient  Specialty: Interventional Pain Management  Patient type: Established  Location: [facility] (AMB) Pain Management Facility  Visit type: Interventional Procedure   Primary Reason for Visit: Interventional Pain Management Treatment. CC: Back Pain (low)  Procedure:          Anesthesia, Analgesia, Anxiolysis:  Type: Thermal Lumbar Facet, Medial Branch Radiofrequency Ablation/Neurotomy  #1  Primary Purpose: Therapeutic Region: Posterolateral Lumbosacral Spine Level: L2, L3, L4, L5, & S1 Medial Branch Level(s). These levels will denervate the L3-4, L4-5, and the L5-S1 lumbar facet joints. Laterality: Left  Type: Moderate (Conscious) Sedation combined with Local Anesthesia Indication(s): Analgesia and Anxiety Route: Intravenous (IV) IV Access: Secured Sedation: Meaningful verbal contact was maintained at all times during the procedure  Local Anesthetic: Lidocaine 1-2%  Position: Prone   Indications: 1. Lumbar facet syndrome (Bilateral)   2. Spondylosis without myelopathy or radiculopathy, lumbosacral region   3. Lumbar facet hypertrophy (Bilateral)   4. Osteoarthritis of facet joint of lumbar spine   5. Grade 1 Lumbosacral Retrolisthesis of L5/S1   6. DDD (degenerative disc disease), lumbosacral   7. Osteoarthritis of lumbar spine   8. Chronic low back pain (Primary area of Pain) (Bilateral) (R=L) w/o sciatica    Mr. Hineman has been dealing with the above chronic pain for longer than three months and has either failed to respond, was unable to tolerate, or simply did not get enough benefit from other more conservative therapies including, but not limited to: 1. Over-the-counter medications 2. Anti-inflammatory medications 3. Muscle relaxants 4. Membrane stabilizers 5. Opioids 6.  Physical therapy and/or chiropractic manipulation 7. Modalities (Heat, ice, etc.) 8. Invasive techniques such as nerve blocks. Mr. Taborda has attained more than 50% relief of the pain from a series of diagnostic injections conducted in separate occasions.  Pain Score: Pre-procedure: 5 /10 Post-procedure: 0-No pain/10  Pre-op Assessment:  Mr. Statzer is a 43 y.o. (year old), male patient, seen today for interventional treatment. He  has a past surgical history that includes Multiple tooth extractions (15 yrs. ago) and Anterior cervical decomp/discectomy fusion (N/A, 05/21/2017). Mr. Trefry has a current medication list which includes the following prescription(s): albuterol, atorvastatin, vitamin d3, clonazepam, depo-testosterone, levothyroxine, lisinopril, oxycodone, oxycodone, pregabalin, pregabalin, pregabalin, pregabalin, pregabalin, promethazine, sildenafil, hydrocodone-acetaminophen, hydrocodone-acetaminophen, and omeprazole, and the following Facility-Administered Medications: fentanyl and midazolam. His primarily concern today is the Back Pain (low)  Initial Vital Signs:  Pulse/HCG Rate: 66ECG Heart Rate: 70 Temp: 98.1 F (36.7 C) Resp: 18 BP: (!) 164/95 SpO2: 97 %  BMI: Estimated body mass index is 28.41 kg/m as calculated from the following:   Height as of this encounter: 5' 6"  (1.676 m).   Weight as of this encounter: 176 lb (79.8 kg).  Risk Assessment: Allergies: Reviewed. He is allergic to chantix [varenicline].  Allergy Precautions: None required Coagulopathies: Reviewed. None identified.  Blood-thinner therapy: None at this time Active Infection(s): Reviewed. None identified. Mr. Smoker is afebrile  Site Confirmation: Mr. Pfiffner was asked to confirm the procedure and laterality before marking the site Procedure checklist: Completed Consent: Before the procedure and under the influence of no sedative(s), amnesic(s), or anxiolytics, the patient was informed of the treatment options,  risks and possible complications. To fulfill our ethical and legal obligations, as recommended by the American Medical Association's Code of  Ethics, I have informed the patient of my clinical impression; the nature and purpose of the treatment or procedure; the risks, benefits, and possible complications of the intervention; the alternatives, including doing nothing; the risk(s) and benefit(s) of the alternative treatment(s) or procedure(s); and the risk(s) and benefit(s) of doing nothing. The patient was provided information about the general risks and possible complications associated with the procedure. These may include, but are not limited to: failure to achieve desired goals, infection, bleeding, organ or nerve damage, allergic reactions, paralysis, and death. In addition, the patient was informed of those risks and complications associated to Spine-related procedures, such as failure to decrease pain; infection (i.e.: Meningitis, epidural or intraspinal abscess); bleeding (i.e.: epidural hematoma, subarachnoid hemorrhage, or any other type of intraspinal or peri-dural bleeding); organ or nerve damage (i.e.: Any type of peripheral nerve, nerve root, or spinal cord injury) with subsequent damage to sensory, motor, and/or autonomic systems, resulting in permanent pain, numbness, and/or weakness of one or several areas of the body; allergic reactions; (i.e.: anaphylactic reaction); and/or death. Furthermore, the patient was informed of those risks and complications associated with the medications. These include, but are not limited to: allergic reactions (i.e.: anaphylactic or anaphylactoid reaction(s)); adrenal axis suppression; blood sugar elevation that in diabetics may result in ketoacidosis or comma; water retention that in patients with history of congestive heart failure may result in shortness of breath, pulmonary edema, and decompensation with resultant heart failure; weight gain; swelling or edema;  medication-induced neural toxicity; particulate matter embolism and blood vessel occlusion with resultant organ, and/or nervous system infarction; and/or aseptic necrosis of one or more joints. Finally, the patient was informed that Medicine is not an exact science; therefore, there is also the possibility of unforeseen or unpredictable risks and/or possible complications that may result in a catastrophic outcome. The patient indicated having understood very clearly. We have given the patient no guarantees and we have made no promises. Enough time was given to the patient to ask questions, all of which were answered to the patient's satisfaction. Mr. Avitabile has indicated that he wanted to continue with the procedure. Attestation: I, the ordering provider, attest that I have discussed with the patient the benefits, risks, side-effects, alternatives, likelihood of achieving goals, and potential problems during recovery for the procedure that I have provided informed consent. Date   Time: 12/10/2018 10:50 AM  Pre-Procedure Preparation:  Monitoring: As per clinic protocol. Respiration, ETCO2, SpO2, BP, heart rate and rhythm monitor placed and checked for adequate function Safety Precautions: Patient was assessed for positional comfort and pressure points before starting the procedure. Time-out: I initiated and conducted the "Time-out" before starting the procedure, as per protocol. The patient was asked to participate by confirming the accuracy of the "Time Out" information. Verification of the correct person, site, and procedure were performed and confirmed by me, the nursing staff, and the patient. "Time-out" conducted as per Joint Commission's Universal Protocol (UP.01.01.01). Time: 1131  Description of Procedure:          Laterality: Left Levels:  L2, L3, L4, L5, & S1 Medial Branch Level(s), at the L3-4, L4-5, and the L5-S1 lumbar facet joints. Area Prepped: Lumbosacral Prepping solution: DuraPrep  (Iodine Povacrylex [0.7% available iodine] and Isopropyl Alcohol, 74% w/w) Safety Precautions: Aspiration looking for blood return was conducted prior to all injections. At no point did we inject any substances, as a needle was being advanced. Before injecting, the patient was told to immediately notify me if he was experiencing  any new onset of "ringing in the ears, or metallic taste in the mouth". No attempts were made at seeking any paresthesias. Safe injection practices and needle disposal techniques used. Medications properly checked for expiration dates. SDV (single dose vial) medications used. After the completion of the procedure, all disposable equipment used was discarded in the proper designated medical waste containers. Local Anesthesia: Protocol guidelines were followed. The patient was positioned over the fluoroscopy table. The area was prepped in the usual manner. The time-out was completed. The target area was identified using fluoroscopy. A 12-in long, straight, sterile hemostat was used with fluoroscopic guidance to locate the targets for each level blocked. Once located, the skin was marked with an approved surgical skin marker. Once all sites were marked, the skin (epidermis, dermis, and hypodermis), as well as deeper tissues (fat, connective tissue and muscle) were infiltrated with a small amount of a short-acting local anesthetic, loaded on a 10cc syringe with a 25G, 1.5-in  Needle. An appropriate amount of time was allowed for local anesthetics to take effect before proceeding to the next step. Local Anesthetic: Lidocaine 2.0% The unused portion of the local anesthetic was discarded in the proper designated containers. Technical explanation of process:  Radiofrequency Ablation (RFA) L2 Medial Branch Nerve RFA: The target area for the L2 medial branch is at the junction of the postero-lateral aspect of the superior articular process and the superior, posterior, and medial edge of the  transverse process of L3. Under fluoroscopic guidance, a Radiofrequency needle was inserted until contact was made with os over the superior postero-lateral aspect of the pedicular shadow (target area). Sensory and motor testing was conducted to properly adjust the position of the needle. Once satisfactory placement of the needle was achieved, the numbing solution was slowly injected after negative aspiration for blood. 2.0 mL of the nerve block solution was injected without difficulty or complication. After waiting for at least 3 minutes, the ablation was performed. Once completed, the needle was removed intact. L3 Medial Branch Nerve RFA: The target area for the L3 medial branch is at the junction of the postero-lateral aspect of the superior articular process and the superior, posterior, and medial edge of the transverse process of L4. Under fluoroscopic guidance, a Radiofrequency needle was inserted until contact was made with os over the superior postero-lateral aspect of the pedicular shadow (target area). Sensory and motor testing was conducted to properly adjust the position of the needle. Once satisfactory placement of the needle was achieved, the numbing solution was slowly injected after negative aspiration for blood. 2.0 mL of the nerve block solution was injected without difficulty or complication. After waiting for at least 3 minutes, the ablation was performed. Once completed, the needle was removed intact. L4 Medial Branch Nerve RFA: The target area for the L4 medial branch is at the junction of the postero-lateral aspect of the superior articular process and the superior, posterior, and medial edge of the transverse process of L5. Under fluoroscopic guidance, a Radiofrequency needle was inserted until contact was made with os over the superior postero-lateral aspect of the pedicular shadow (target area). Sensory and motor testing was conducted to properly adjust the position of the needle. Once  satisfactory placement of the needle was achieved, the numbing solution was slowly injected after negative aspiration for blood. 2.0 mL of the nerve block solution was injected without difficulty or complication. After waiting for at least 3 minutes, the ablation was performed. Once completed, the needle was removed intact. L5  Medial Branch Nerve RFA: The target area for the L5 medial branch is at the junction of the postero-lateral aspect of the superior articular process of S1 and the superior, posterior, and medial edge of the sacral ala. Under fluoroscopic guidance, a Radiofrequency needle was inserted until contact was made with os over the superior postero-lateral aspect of the pedicular shadow (target area). Sensory and motor testing was conducted to properly adjust the position of the needle. Once satisfactory placement of the needle was achieved, the numbing solution was slowly injected after negative aspiration for blood. 2.0 mL of the nerve block solution was injected without difficulty or complication. After waiting for at least 3 minutes, the ablation was performed. Once completed, the needle was removed intact. S1 Medial Branch Nerve RFA: The target area for the S1 medial branch is located inferior to the junction of the S1 superior articular process and the L5 inferior articular process, posterior, inferior, and lateral to the 6 o'clock position of the L5-S1 facet joint, just superior to the S1 posterior foramen. Under fluoroscopic guidance, the Radiofrequency needle was advanced until contact was made with os over the Target area. Sensory and motor testing was conducted to properly adjust the position of the needle. Once satisfactory placement of the needle was achieved, the numbing solution was slowly injected after negative aspiration for blood. 2.0 mL of the nerve block solution was injected without difficulty or complication. After waiting for at least 3 minutes, the ablation was performed.  Once completed, the needle was removed intact. Radiofrequency lesioning (ablation):  Radiofrequency Generator: NeuroTherm [id number] Sensory Stimulation Parameters: 50 Hz was used to locate & identify the nerve, making sure that the needle was positioned such that there was no sensory stimulation below 0.3 V or above 0.7 V. Motor Stimulation Parameters: 2 Hz was used to evaluate the motor component. Care was taken not to lesion any nerves that demonstrated motor stimulation of the lower extremities at an output of less than 2.5 times that of the sensory threshold, or a maximum of 2.0 V. Lesioning Technique Parameters: Standard Radiofrequency settings. (Not bipolar or pulsed.) Temperature Settings: 80 degrees C Lesioning time: 60 seconds Intra-operative Compliance: Compliant Materials & Medications: Needle(s) (Electrode/Cannula) Type: Teflon-coated, curved tip, Radiofrequency needle(s) Gauge: 22G Length: 10cm Numbing solution: 0.2% PF-Ropivacaine + Triamcinolone (40 mg/mL) diluted to a final concentration of 4 mg of Triamcinolone/mL of Ropivacaine The unused portion of the solution was discarded in the proper designated containers.  Once the entire procedure was completed, the treated area was cleaned, making sure to leave some of the prepping solution back to take advantage of its long term bactericidal properties.  Illustration of the posterior view of the lumbar spine and the posterior neural structures. Laminae of L2 through S1 are labeled. DPRL5, dorsal primary ramus of L5; DPRS1, dorsal primary ramus of S1; DPR3, dorsal primary ramus of L3; FJ, facet (zygapophyseal) joint L3-L4; I, inferior articular process of L4; LB1, lateral branch of dorsal primary ramus of L1; IAB, inferior articular branches from L3 medial branch (supplies L4-L5 facet joint); IBP, intermediate branch plexus; MB3, medial branch of dorsal primary ramus of L3; NR3, third lumbar nerve root; S, superior articular process of L5;  SAB, superior articular branches from L4 (supplies L4-5 facet joint also); TP3, transverse process of L3.  Vitals:   12/10/18 1215 12/10/18 1225 12/10/18 1235 12/10/18 1246  BP: (!) 177/109 (!) 158/104 (!) 155/95 (!) 150/92  Pulse:      Resp: 16 16 19  18  Temp:  98.4 F (36.9 C)  98.1 F (36.7 C)  TempSrc:      SpO2: 100% 98% 99% 98%  Weight:      Height:      Note: The patient indicated having forgotten to take his blood pressure medicine this morning.  He was monitored throughout and did not indicate having any headaches, blurred vision, or any other concerning symptom.  Start Time: 1131 hrs. End Time:   hrs.  Imaging Guidance (Spinal):          Type of Imaging Technique: Fluoroscopy Guidance (Spinal) Indication(s): Assistance in needle guidance and placement for procedures requiring needle placement in or near specific anatomical locations not easily accessible without such assistance. Exposure Time: Please see nurses notes. Contrast: None used. Fluoroscopic Guidance: I was personally present during the use of fluoroscopy. "Tunnel Vision Technique" used to obtain the best possible view of the target area. Parallax error corrected before commencing the procedure. "Direction-depth-direction" technique used to introduce the needle under continuous pulsed fluoroscopy. Once target was reached, antero-posterior, oblique, and lateral fluoroscopic projection used confirm needle placement in all planes. Images permanently stored in EMR. Interpretation: No contrast injected. I personally interpreted the imaging intraoperatively. Adequate needle placement confirmed in multiple planes. Permanent images saved into the patient's record.  Antibiotic Prophylaxis:   Anti-infectives (From admission, onward)   None     Indication(s): None identified  Post-operative Assessment:  Post-procedure Vital Signs:  Pulse/HCG Rate: 7570 Temp: 98.1 F (36.7 C) Resp: 18 BP: (!) 150/92 SpO2: 98  %  EBL: None  Complications: No immediate post-treatment complications observed by team, or reported by patient.  Note: The patient tolerated the entire procedure well. A repeat set of vitals were taken after the procedure and the patient was kept under observation following institutional policy, for this type of procedure. Post-procedural neurological assessment was performed, showing return to baseline, prior to discharge. The patient was provided with post-procedure discharge instructions, including a section on how to identify potential problems. Should any problems arise concerning this procedure, the patient was given instructions to immediately contact us, at any time, without hesitation. In any case, we plan to contact the patient by telephone for a follow-up status report regarding this interventional procedure.  Comments:  No additional relevant information.  Plan of Care  Orders:  Orders Placed This Encounter  Procedures   Radiofrequency,Lumbar    Scheduling Instructions:     Side(s): Left-sided     Level: L3-4, L4-5, & L5-S1 Facets (L2, L3, L4, L5, & S1 Medial Branch Nerves)     Sedation: With Sedation     Timeframe: Today    Order Specific Question:   Where will this procedure be performed?    Answer:   [facility]   Radiofrequency,Lumbar    Standing Status:   Future    Standing Expiration Date:   06/08/2020    Scheduling Instructions:     Side(s): Right-sided     Level: L3-4, L4-5, & L5-S1 Facets (L2, L3, L4, L5, & S1 Medial Branch Nerves)     Sedation: With Sedation     Scheduling Timeframe: 2 weeks from now    Order Specific Question:   Where will this procedure be performed?    Answer:   [facility]   DG PAIN [facility]IN[facility] C-ARM 1-60 MIN NO REPORT    Intraoperative interpretation by procedural physician at [place].    Standing Status:   Standing    Number of Occurrences:  1    Order Specific Question:   Reason for exam:    Answer:    Assistance in needle guidance and placement for procedures requiring needle placement in or near specific anatomical locations not easily accessible without such assistance.   Informed Consent Details: Physician/Practitioner Attestation; Transcribe to consent form and obtain patient signature    Provider Attestation: I, [place] Dossie Arbour, MD, (Pain Management Specialist), the physician/practitioner, attest that I have discussed with the patient the benefits, risks, side effects, alternatives, likelihood of achieving goals and potential problems during recovery for the procedure that I have provided informed consent.    Scheduling Instructions:     Procedure: Lumbar Facet Radiofrequency Ablation     Indication/Reason: Low Back Pain, with our without leg pain, due to Facet Joint Arthralgia (Joint Pain) known as Lumbar Facet Syndrome, secondary to Lumbar, and/or Lumbosacral Spondylosis (Arthritis of the Spine), without myelopathy or radiculopathy (Nerve Damage).     Note: Always confirm laterality of pain with Mr. Hisaw, before procedure.   Chronic Opioid Analgesic:  Oxycodone IR 5 mg, 1 tab PO q 6 hrs (20 mg/day of oxycodone) MME/day: 30 mg/day.   Medications ordered for procedure: Meds ordered this encounter  Medications   lidocaine (XYLOCAINE) 2 % (with pres) injection 400 mg   lactated ringers infusion 1,000 mL   midazolam (VERSED) 5 MG/5ML injection 1-2 mg    Make sure Flumazenil is available in the pyxis when using this medication. If oversedation occurs, administer 0.2 mg IV over 15 sec. If after 45 sec no response, administer 0.2 mg again over 1 min; may repeat at 1 min intervals; not to exceed 4 doses (1 mg)   fentaNYL (SUBLIMAZE) injection 25-50 mcg    Make sure Narcan is available in the pyxis when using this medication. In the event of respiratory depression (RR< 8/min): Titrate NARCAN (naloxone) in increments of 0.1 to 0.2 mg IV at 2-3 minute intervals, until desired degree of  reversal.   ropivacaine (PF) 2 mg/mL (0.2%) (NAROPIN) injection 9 mL   triamcinolone acetonide (KENALOG-40) injection 40 mg   HYDROcodone-acetaminophen (NORCO/VICODIN) 5-325 MG tablet    Sig: Take 1 tablet by mouth every 8 (eight) hours as needed for up to 7 days for severe pain. Must last 7 days.    Dispense:  21 tablet    Refill:  0    For acute post-operative pain. Not to be refilled. Must last 7 days.   HYDROcodone-acetaminophen (NORCO/VICODIN) 5-325 MG tablet    Sig: Take 1 tablet by mouth every 8 (eight) hours as needed for up to 7 days for severe pain. Must last for 7 days.    Dispense:  21 tablet    Refill:  0    For acute post-operative pain. Not to be refilled. Must last 7 days.   Medications administered: We administered lidocaine, lactated ringers, midazolam, fentaNYL, ropivacaine (PF) 2 mg/mL (0.2%), and triamcinolone acetonide.  See the medical record for exact dosing, route, and time of administration.  Follow-up plan:   Return in about 2 weeks (around 12/24/2018) for RFA (w/ sedation): (R) L-FCT RFA #1.       Considering:   Therapeutic bilateral lumbar facet RFA  (we will start on the left side) (scheduled for 12/10/2018)   PRN Procedures:   Palliative/therapeutic bilateral lumbar facet block #3      Recent Visits Date Type Provider Dept  11/30/18 Office Visit Milinda Pointer, MD [facility]  10/21/18 Office Visit Milinda Pointer, MD [facility]  09/22/18 Procedure visit Milinda Pointer, MD [facility]  09/16/18 Office Visit Milinda Pointer, MD [facility]  Showing recent visits within past 90 days and meeting all other requirements   Today's Visits Date Type Provider Dept  12/10/18 Procedure visit Milinda Pointer, MD [facility]  Showing today's visits and meeting all other requirements   Future Appointments Date Type Provider Dept  01/11/19 Appointment Milinda Pointer, MD [facility]  01/27/19 Appointment Milinda Pointer, MD [facility]  Showing future appointments within next 90 days and meeting all other requirements   Disposition: Discharge home  Discharge Date & Time: 12/10/2018; 1249 hrs.   Primary Care Physician: Dorothyann Peng, NP Location: [facility] Note by: [name], MD Date: 12/10/2018; Time: 1:57 PM  Disclaimer:  Medicine is not an Chief Strategy Officer. The only guarantee in medicine is that nothing is guaranteed. It is important to note that the decision to proceed with this intervention was based on the information collected from the patient. The Data and conclusions were drawn from the patient's questionnaire, the interview, and the physical examination. Because the information was provided in large part by the patient, it cannot be guaranteed that it has not been purposely or unconsciously manipulated. Every effort has been made to obtain as much relevant data as possible for this evaluation. It is important to note that the conclusions that lead to this procedure are derived in large part from the available data. Always take into account that the treatment will also be dependent on availability of resources and existing treatment guidelines, considered by other Pain Management Practitioners as being common knowledge and practice, at the time of the intervention. For Medico-Legal purposes, it is also important to point out that variation in procedural techniques and pharmacological choices are the acceptable norm. The indications, contraindications, technique, and results of the above procedure should only be interpreted and judged by a Board-Certified Interventional Pain Specialist with extensive familiarity and expertise in the same exact procedure and technique.

## 2018-12-10 NOTE — Telephone Encounter (Signed)
Called pharmacy. This is for Post RF pain and he can fill it.

## 2018-12-10 NOTE — Telephone Encounter (Signed)
The pharmacy called to let us know that the patient picked up a script for 30 days supply of oxycodone on 9/14. They got the script for vicodin from [place] yesterday and wasn't sure if they should fill it. Please call and let them know if you want them to still  fill the vicodin.

## 2018-12-11 ENCOUNTER — Telehealth: Payer: Self-pay

## 2018-12-11 MED FILL — SILDENAFIL CITRATE 20 MG TA: 20 | 18 days supply | Qty: 90 | Fill #1

## 2018-12-11 MED FILL — busPIRone HCL 5 MG TABS: 5 | 30 days supply | Qty: 60 | Fill #1

## 2018-12-11 NOTE — Telephone Encounter (Signed)
Post procedure phone call.  Patient states he is doing well.

## 2018-12-15 ENCOUNTER — Encounter: Payer: Self-pay | Admitting: Gastroenterology

## 2018-12-15 ENCOUNTER — Ambulatory Visit (INDEPENDENT_AMBULATORY_CARE_PROVIDER_SITE_OTHER): Payer: 59 | Admitting: Gastroenterology

## 2018-12-15 ENCOUNTER — Other Ambulatory Visit: Payer: Self-pay

## 2018-12-15 VITALS — BP 160/94 | HR 76 | Temp 98.0°F | Ht 66.0 in | Wt 202.2 lb

## 2018-12-15 DIAGNOSIS — R933 Abnormal findings on diagnostic imaging of other parts of digestive tract: Secondary | ICD-10-CM | POA: Diagnosis not present

## 2018-12-15 DIAGNOSIS — R197 Diarrhea, unspecified: Secondary | ICD-10-CM

## 2018-12-15 DIAGNOSIS — R194 Change in bowel habit: Secondary | ICD-10-CM

## 2018-12-15 DIAGNOSIS — K219 Gastro-esophageal reflux disease without esophagitis: Secondary | ICD-10-CM | POA: Diagnosis not present

## 2018-12-15 MED ORDER — GOLYTELY 236 G PO SOLR: 4000.0000 mL | Freq: Once | ORAL | 0 refills | Status: AC

## 2018-12-15 MED ORDER — PANTOPRAZOLE SODIUM 40 MG PO TBEC: 40.0000 mg | DELAYED_RELEASE_TABLET | Freq: Every day | ORAL | 5 refills | Status: DC

## 2018-12-15 MED FILL — PANTOPRAZOLE SOD DR 40 MG T: 40 | 30 days supply | Qty: 30 | Fill #0

## 2018-12-15 NOTE — Patient Instructions (Signed)
If you are age 44 or older, your body mass index should be between 23-30. Your Body mass index is 32.64 kg/m. If this is out of the aforementioned range listed, please consider follow up with your Primary Care Provider.  If you are age 78 or younger, your body mass index should be between 19-25. Your Body mass index is 32.64 kg/m. If this is out of the aformentioned range listed, please consider follow up with your Primary Care Provider.   You have been scheduled for an endoscopy and colonoscopy. Please follow the written instructions given to you at your visit today. Please pick up your prep supplies at the pharmacy within the next 1-3 days. If you use inhalers (even only as needed), please bring them with you on the day of your procedure. Your physician has requested that you go to www.startemmi.com and enter the access code given to you at your visit today. This web site gives a general overview about your procedure. However, you should still follow specific instructions given to you by our office regarding your preparation for the procedure.  We have sent the following medications to your pharmacy for you to pick up at your convenience: Golytely Pantoprazole  Thank you for choosing me and [facility].  Alonza Bogus, PA-C

## 2018-12-15 NOTE — Progress Notes (Unsigned)
goly

## 2018-12-15 NOTE — Progress Notes (Signed)
12/15/2018 Chad Wilson [id number] Mar 04, 1976   HISTORY OF PRESENT ILLNESS: This is a 43 year old male who is new to our office.  He was referred here by his PCP, Dorothyann Peng, NP, for evaluation of an abnormal CT scan.  The patient tells me that he has been having issues with abdominal pain for the past few months.  He says that his and lower abdomen feels sore all the time.  Feels very gassy and bloated.  Reports diarrhea frequently.  No blood in his stools.  Ultrasound in July showed increased echotexture throughout the liver reflecting fatty infiltration.  LFTs are normal except for a mildly elevated ALT at 56.  CT scan of the abdomen pelvis with contrast on September 9 showed wall thickening portions of the distal sigmoid and upper rectum consistent with distal colitis.  This also showed prominent liver without focal liver lesions.  He also reports a lot of issues with acid reflux.  He says it wakes him up at night.  He does admit that he eats late at night due to work, etc.  Not on any type of acid reducing medication at this time.  Past Medical History:  Diagnosis Date  . Acute hepatitis C without mention of hepatic coma(070.51)    has been cleared   . Alcohol abuse, unspecified   . Anxiety and depression   . Anxiety states   . Essential hypertension, benign   . Graves disease    treated by Dr Loanne Drilling  . Panic disorder without agoraphobia   . Thyroid disease    Graves Disease  . Tobacco use disorder    Past Surgical History:  Procedure Laterality Date  . ANTERIOR CERVICAL DECOMP/DISCECTOMY FUSION N/A 05/21/2017   Procedure: Anterior Cervical Decompression Fusion Cervical five-six, Cervical six-seven;  Surgeon: Eustace Moore, MD;  Location: [facility];  Service: Neurosurgery;  Laterality: N/A;  . MULTIPLE TOOTH EXTRACTIONS  15 yrs. ago    reports that he has been smoking cigarettes. He has a 30.00 pack-year smoking history. His smokeless tobacco use includes chew. He reports  current alcohol use of about 42.0 standard drinks of alcohol per week. He reports previous drug use. family history includes Anxiety disorder in his father and sister; Heart disease in his father; Hypertension in his father; Hyperthyroidism in his sister; Thyroid disease in his mother. Allergies  Allergen Reactions  . Chantix [Varenicline] Other (See Comments)    Agitation and sleep walking       Outpatient Encounter Medications as of 12/15/2018  Medication Sig  . albuterol (VENTOLIN HFA) 108 (90 Base) MCG/ACT inhaler Inhale 2 puffs into the lungs every 6 (six) hours as needed for wheezing or shortness of breath.  Marland Kitchen atorvastatin (LIPITOR) 20 MG tablet TAKE 1 TABLET (20 MG TOTAL) BY MOUTH DAILY.  Marland Kitchen Cholecalciferol (VITAMIN D3) 125 MCG (5000 UT) CAPS Take 1 capsule (5,000 Units total) by mouth daily with breakfast. Take along with calcium and magnesium.  . clonazePAM (KLONOPIN) 0.5 MG tablet TAKE 1 TABLET (0.5 MG TOTAL) BY MOUTH 2 (TWO) TIMES DAILY AS NEEDED FOR ANXIETY.  Marland Kitchen DEPO-TESTOSTERONE 200 MG/ML injection Take 200 mg by mouth every 14 (fourteen) days.  Marland Kitchen HYDROcodone-acetaminophen (NORCO/VICODIN) 5-325 MG tablet Take 1 tablet by mouth every 8 (eight) hours as needed for up to 7 days for severe pain. Must last 7 days.  Derrill Memo ON 12/17/2018] HYDROcodone-acetaminophen (NORCO/VICODIN) 5-325 MG tablet Take 1 tablet by mouth every 8 (eight) hours as needed for up to 7  days for severe pain. Must last for 7 days.  Marland Kitchen levothyroxine (SYNTHROID) 150 MCG tablet TAKE 1 TABLET BY MOUTH DAILY.  Marland Kitchen lisinopril (ZESTRIL) 40 MG tablet TAKE 1 TABLET BY MOUTH DAILY  . oxyCODONE (OXY IR/ROXICODONE) 5 MG immediate release tablet Take 1 tablet (5 mg total) by mouth every 6 (six) hours as needed for severe pain. Must last 30 days  . [START ON 12/30/2018] oxyCODONE (OXY IR/ROXICODONE) 5 MG immediate release tablet Take 1 tablet (5 mg total) by mouth every 6 (six) hours as needed for severe pain. Must last 30 days  .  [START ON 01/11/2019] pregabalin (LYRICA) 100 MG capsule Take 1 capsule (100 mg total) by mouth 3 (three) times daily for 14 days.  Derrill Memo ON 01/25/2019] pregabalin (LYRICA) 150 MG capsule Take 1 capsule (150 mg total) by mouth 3 (three) times daily for 14 days.  . pregabalin (LYRICA) 50 MG capsule Take 1 capsule (50 mg total) by mouth 3 (three) times daily for 14 days.  Derrill Memo ON 12/28/2018] pregabalin (LYRICA) 75 MG capsule Take 1 capsule (75 mg total) by mouth 3 (three) times daily for 14 days.  . promethazine (PHENERGAN) 12.5 MG tablet Take 1 tablet (12.5 mg total) by mouth every 6 (six) hours as needed for nausea or vomiting.  . sildenafil (REVATIO) 20 MG tablet   . pregabalin (LYRICA) 50 MG capsule Take 1 capsule (50 mg total) by mouth 2 (two) times daily for 14 days.  . [DISCONTINUED] omeprazole (PRILOSEC) 20 MG capsule Take 1 capsule (20 mg total) by mouth daily.   No facility-administered encounter medications on file as of 12/15/2018.      REVIEW OF SYSTEMS  : All other systems reviewed and negative except where noted in the History of Present Illness.   PHYSICAL EXAM: BP (!) 160/94   Pulse 76   Temp 98 F (36.7 C)   Ht 5' 6"  (1.676 m)   Wt 202 lb 3.2 oz (91.7 kg)   BMI 32.64 kg/m  General: Well developed white male in no acute distress Head: Normocephalic and atraumatic Eyes:  Sclerae anicteric, conjunctiva pink. Ears: Normal auditory acuity Lungs: Clear throughout to auscultation; no increased WOB. Heart: Regular rate and rhythm; no M/R/G. Abdomen: Soft, non-distended.  BS present.  Mild lower abdominal TTP. Rectal:  Will be done at the time of colonoscopy. Musculoskeletal: Symmetrical with no gross deformities  Skin: No lesions on visible extremities Extremities: No edema  Neurological: Alert oriented x 4, grossly non-focal Psychological:  Alert and cooperative. Normal mood and affect  ASSESSMENT AND PLAN: *Change in bowel habits with diarrhea, gas/bloating, and  abnormal CT scan showing thickening suspicious for colitis/proctitis:  Will plan for colonoscopy with Dr. Ardis Hughs to rule out IBD, etc. *GERD:  Will schedule for EGD as well.  Will start pantoprazole 40 mg daily.  Prescription sent.  **The risks, benefits, and alternatives to EGD and colonoscopy were discussed with the patient and he consents to proceed.  CC:  Dorothyann Peng, NP

## 2018-12-22 ENCOUNTER — Telehealth: Payer: Self-pay | Admitting: Gastroenterology

## 2018-12-22 NOTE — Telephone Encounter (Signed)
Spoke with patient regarding prep.  I asked patient to check with pharmacy to see how expensive Golytely will be out of pocket.  Suprep will probably be more expensive even if insurance pays their part. Patient stated he would do that and get back with me.

## 2018-12-23 ENCOUNTER — Encounter: Payer: Self-pay | Admitting: Gastroenterology

## 2018-12-23 MED FILL — PEG-3350 SOLUTION: 420 | 1 days supply | Qty: 4000 | Fill #0

## 2018-12-24 NOTE — Progress Notes (Signed)
I agree with the above note, plan 

## 2018-12-30 MED FILL — oxyCODONE HCL 5 MG TABS: 5 | 30 days supply | Qty: 120 | Fill #0

## 2019-01-01 ENCOUNTER — Encounter: Payer: 59 | Admitting: Gastroenterology

## 2019-01-05 ENCOUNTER — Encounter: Payer: Self-pay | Admitting: Pain Medicine

## 2019-01-05 ENCOUNTER — Ambulatory Visit
Admission: RE | Admit: 2019-01-05 | Discharge: 2019-01-05 | Disposition: A | Discharge status: Home / Self Care | Payer: 59 | Source: Ambulatory Visit | Attending: Pain Medicine | Admitting: Pain Medicine

## 2019-01-05 ENCOUNTER — Other Ambulatory Visit: Payer: Self-pay

## 2019-01-05 ENCOUNTER — Other Ambulatory Visit: Payer: Self-pay | Admitting: Adult Health

## 2019-01-05 ENCOUNTER — Ambulatory Visit (HOSPITAL_BASED_OUTPATIENT_CLINIC_OR_DEPARTMENT_OTHER): Payer: 59 | Admitting: Pain Medicine

## 2019-01-05 VITALS — BP 138/84 | HR 78 | Temp 98.2°F | Resp 16 | Ht 66.0 in | Wt 180.0 lb

## 2019-01-05 DIAGNOSIS — G894 Chronic pain syndrome: Secondary | ICD-10-CM | POA: Diagnosis not present

## 2019-01-05 DIAGNOSIS — M4310 Spondylolisthesis, site unspecified: Secondary | ICD-10-CM

## 2019-01-05 DIAGNOSIS — M47816 Spondylosis without myelopathy or radiculopathy, lumbar region: Secondary | ICD-10-CM

## 2019-01-05 DIAGNOSIS — G8918 Other acute postprocedural pain: Secondary | ICD-10-CM | POA: Insufficient documentation

## 2019-01-05 DIAGNOSIS — M47817 Spondylosis without myelopathy or radiculopathy, lumbosacral region: Secondary | ICD-10-CM

## 2019-01-05 DIAGNOSIS — F419 Anxiety disorder, unspecified: Secondary | ICD-10-CM

## 2019-01-05 DIAGNOSIS — G8929 Other chronic pain: Secondary | ICD-10-CM

## 2019-01-05 DIAGNOSIS — M5137 Other intervertebral disc degeneration, lumbosacral region: Secondary | ICD-10-CM | POA: Diagnosis not present

## 2019-01-05 DIAGNOSIS — M545 Low back pain: Secondary | ICD-10-CM | POA: Insufficient documentation

## 2019-01-05 MED ORDER — OXYCODONE HCL 5 MG PO TABS: 5.0000 mg | ORAL_TABLET | Freq: Four times a day (QID) | ORAL | 0 refills | Status: DC | PRN

## 2019-01-05 MED ORDER — HYDROCODONE-ACETAMINOPHEN 5-325 MG PO TABS: 1.0000 | ORAL_TABLET | Freq: Three times a day (TID) | ORAL | 0 refills | Status: DC | PRN

## 2019-01-05 MED ORDER — LACTATED RINGERS IV SOLN
1000.0000 mL | Freq: Once | INTRAVENOUS | Status: AC
  Administered 2019-01-05: 1000 mL via INTRAVENOUS

## 2019-01-05 MED ORDER — MIDAZOLAM HCL 5 MG/5ML IJ SOLN
1.0000 mg | INTRAMUSCULAR | Status: DC | PRN
  Administered 2019-01-05: 10:00:00 3 mg via INTRAVENOUS

## 2019-01-05 MED ORDER — MIDAZOLAM HCL 5 MG/5ML IJ SOLN
INTRAMUSCULAR | Status: AC
  Filled 2019-01-05: qty 5

## 2019-01-05 MED ORDER — ROPIVACAINE HCL 2 MG/ML IJ SOLN
INTRAMUSCULAR | Status: AC
  Filled 2019-01-05: qty 10

## 2019-01-05 MED ORDER — FENTANYL CITRATE (PF) 100 MCG/2ML IJ SOLN
INTRAMUSCULAR | Status: AC
  Filled 2019-01-05: qty 2

## 2019-01-05 MED ORDER — ROPIVACAINE HCL 2 MG/ML IJ SOLN
9.0000 mL | Freq: Once | INTRAMUSCULAR | Status: AC
  Administered 2019-01-05: 9 mL via PERINEURAL

## 2019-01-05 MED ORDER — LIDOCAINE HCL 2 % IJ SOLN
INTRAMUSCULAR | Status: AC
  Filled 2019-01-05: qty 20

## 2019-01-05 MED ORDER — TRIAMCINOLONE ACETONIDE 40 MG/ML IJ SUSP
INTRAMUSCULAR | Status: AC
  Filled 2019-01-05: qty 1

## 2019-01-05 MED ORDER — FENTANYL CITRATE (PF) 100 MCG/2ML IJ SOLN
25.0000 ug | INTRAMUSCULAR | Status: DC | PRN
  Administered 2019-01-05: 10:00:00 100 ug via INTRAVENOUS

## 2019-01-05 MED ORDER — TRIAMCINOLONE ACETONIDE 40 MG/ML IJ SUSP
40.0000 mg | Freq: Once | INTRAMUSCULAR | Status: AC
  Administered 2019-01-05: 09:00:00 40 mg

## 2019-01-05 MED ORDER — LIDOCAINE HCL 2 % IJ SOLN
20.0000 mL | Freq: Once | INTRAMUSCULAR | Status: AC
  Administered 2019-01-05: 400 mg

## 2019-01-05 MED FILL — HYDROCODON-APAP 5-325: 5-325 | 7 days supply | Qty: 21 | Fill #0

## 2019-01-05 MED FILL — clonazePAM 0.5 MG TABS: 0.5 | 15 days supply | Qty: 30 | Fill #0

## 2019-01-05 NOTE — Progress Notes (Signed)
Patient's Name: Chad Wilson  MRN: [id number]  Referring Provider: Milinda Pointer, MD  DOB: 1975-09-23  PCP: Dorothyann Peng, NP  DOS: 01/05/2019  Note by: [name], MD  Service setting: Ambulatory outpatient  Specialty: Interventional Pain Management  Patient type: Established  Location: [facility] (AMB) Pain Management Facility  Visit type: Interventional Procedure   Primary Reason for Visit: Interventional Pain Management Treatment. CC: Back Pain (right, lower)  Procedure:          Anesthesia, Analgesia, Anxiolysis:  Type: Thermal Lumbar Facet, Medial Branch Radiofrequency Ablation/Neurotomy  #1  Primary Purpose: Therapeutic Region: Posterolateral Lumbosacral Spine Level: L2, L3, L4, L5, & S1 Medial Branch Level(s). These levels will denervate the L3-4, L4-5, and the L5-S1 lumbar facet joints. Laterality: Right  Type: Moderate (Conscious) Sedation combined with Local Anesthesia Indication(s): Analgesia and Anxiety Route: Intravenous (IV) IV Access: Secured Sedation: Meaningful verbal contact was maintained at all times during the procedure  Local Anesthetic: Lidocaine 1-2%  Position: Prone   Indications: 1. Lumbar facet syndrome (Bilateral)   2. Spondylosis without myelopathy or radiculopathy, lumbosacral region   3. Lumbar facet hypertrophy (Bilateral)   4. Osteoarthritis of facet joint of lumbar spine   5. DDD (degenerative disc disease), lumbosacral   6. Grade 1 Lumbosacral Retrolisthesis of L5/S1   7. Chronic low back pain (Primary area of Pain) (Bilateral) (R=L) w/o sciatica    Chad Wilson has been dealing with the above chronic pain for longer than three months and has either failed to respond, was unable to tolerate, or simply did not get enough benefit from other more conservative therapies including, but not limited to: 1. Over-the-counter medications 2. Anti-inflammatory medications 3. Muscle relaxants 4. Membrane stabilizers 5. Opioids 6. Physical therapy and/or  chiropractic manipulation 7. Modalities (Heat, ice, etc.) 8. Invasive techniques such as nerve blocks. Chad Wilson has attained more than 50% relief of the pain from a series of diagnostic injections conducted in separate occasions.  Pain Score: Pre-procedure: 6 /10 Post-procedure: 0-No pain/10  Post-Procedure Evaluation  Procedure (12/10/2018): Therapeutic left-sided lumbar facet RFA #1 under fluoroscopic guidance and IV sedation Pre-procedure pain level:  5/10 Post-procedure: 0/10 (100% relief)  Sedation: Sedation provided.  Effectiveness during initial hour after procedure(Ultra-Short Term Relief): 100 %   Local anesthetic used: Long-acting (4-6 hours) Effectiveness: Defined as any analgesic benefit obtained secondary to the administration of local anesthetics. This carries significant diagnostic value as to the etiological location, or anatomical origin, of the pain. Duration of benefit is expected to coincide with the duration of the local anesthetic used.  Effectiveness during initial 4-6 hours after procedure(Short-Term Relief): 100 %   Long-term benefit: Defined as any relief past the pharmacologic duration of the local anesthetics.  Effectiveness past the initial 6 hours after procedure(Long-Term Relief): 70 %   Pre-op Assessment:  Chad Wilson is a 43 y.o. (year old), male patient, seen today for interventional treatment. He  has a past surgical history that includes Multiple tooth extractions (15 yrs. ago) and Anterior cervical decomp/discectomy fusion (N/A, 05/21/2017). Chad Wilson has a current medication list which includes the following prescription(s): albuterol, atorvastatin, clonazepam, depo-testosterone, levothyroxine, lisinopril, pantoprazole, pregabalin, pregabalin, pregabalin, promethazine, sildenafil, hydrocodone-acetaminophen, hydrocodone-acetaminophen, oxycodone, oxycodone, pregabalin, pregabalin, and omeprazole, and the following Facility-Administered Medications: fentanyl and  midazolam. His primarily concern today is the Back Pain (right, lower)  Initial Vital Signs:  Pulse/HCG Rate: 78ECG Heart Rate: 77 Temp: 98.3 F (36.8 C) Resp: 16 BP: (!) 141/96 SpO2: 96 %  BMI: Estimated body mass  index is 29.05 kg/m as calculated from the following:   Height as of this encounter: 5' 6"  (1.676 m).   Weight as of this encounter: 180 lb (81.6 kg).  Risk Assessment: Allergies: Reviewed. He is allergic to chantix [varenicline].  Allergy Precautions: None required Coagulopathies: Reviewed. None identified.  Blood-thinner therapy: None at this time Active Infection(s): Reviewed. None identified. Chad Wilson is afebrile  Site Confirmation: Chad Wilson was asked to confirm the procedure and laterality before marking the site Procedure checklist: Completed Consent: Before the procedure and under the influence of no sedative(s), amnesic(s), or anxiolytics, the patient was informed of the treatment options, risks and possible complications. To fulfill our ethical and legal obligations, as recommended by the American Medical Association's Code of Ethics, I have informed the patient of my clinical impression; the nature and purpose of the treatment or procedure; the risks, benefits, and possible complications of the intervention; the alternatives, including doing nothing; the risk(s) and benefit(s) of the alternative treatment(s) or procedure(s); and the risk(s) and benefit(s) of doing nothing. The patient was provided information about the general risks and possible complications associated with the procedure. These may include, but are not limited to: failure to achieve desired goals, infection, bleeding, organ or nerve damage, allergic reactions, paralysis, and death. In addition, the patient was informed of those risks and complications associated to Spine-related procedures, such as failure to decrease pain; infection (i.e.: Meningitis, epidural or intraspinal abscess); bleeding (i.e.:  epidural hematoma, subarachnoid hemorrhage, or any other type of intraspinal or peri-dural bleeding); organ or nerve damage (i.e.: Any type of peripheral nerve, nerve root, or spinal cord injury) with subsequent damage to sensory, motor, and/or autonomic systems, resulting in permanent pain, numbness, and/or weakness of one or several areas of the body; allergic reactions; (i.e.: anaphylactic reaction); and/or death. Furthermore, the patient was informed of those risks and complications associated with the medications. These include, but are not limited to: allergic reactions (i.e.: anaphylactic or anaphylactoid reaction(s)); adrenal axis suppression; blood sugar elevation that in diabetics may result in ketoacidosis or comma; water retention that in patients with history of congestive heart failure may result in shortness of breath, pulmonary edema, and decompensation with resultant heart failure; weight gain; swelling or edema; medication-induced neural toxicity; particulate matter embolism and blood vessel occlusion with resultant organ, and/or nervous system infarction; and/or aseptic necrosis of one or more joints. Finally, the patient was informed that Medicine is not an exact science; therefore, there is also the possibility of unforeseen or unpredictable risks and/or possible complications that may result in a catastrophic outcome. The patient indicated having understood very clearly. We have given the patient no guarantees and we have made no promises. Enough time was given to the patient to ask questions, all of which were answered to the patient's satisfaction. Mr. Weidinger has indicated that he wanted to continue with the procedure. Attestation: I, the ordering provider, attest that I have discussed with the patient the benefits, risks, side-effects, alternatives, likelihood of achieving goals, and potential problems during recovery for the procedure that I have provided informed consent. Date   Time:  01/05/2019  9:02 AM  Pre-Procedure Preparation:  Monitoring: As per clinic protocol. Respiration, ETCO2, SpO2, BP, heart rate and rhythm monitor placed and checked for adequate function Safety Precautions: Patient was assessed for positional comfort and pressure points before starting the procedure. Time-out: I initiated and conducted the "Time-out" before starting the procedure, as per protocol. The patient was asked to participate by confirming the  accuracy of the "Time Out" information. Verification of the correct person, site, and procedure were performed and confirmed by me, the nursing staff, and the patient. "Time-out" conducted as per Joint Commission's Universal Protocol (UP.01.01.01). Time: 0940  Description of Procedure:          Laterality: Right Levels:  L2, L3, L4, L5, & S1 Medial Branch Level(s), at the L3-4, L4-5, and the L5-S1 lumbar facet joints. Area Prepped: Lumbosacral Prepping solution: DuraPrep (Iodine Povacrylex [0.7% available iodine] and Isopropyl Alcohol, 74% w/w) Safety Precautions: Aspiration looking for blood return was conducted prior to all injections. At no point did we inject any substances, as a needle was being advanced. Before injecting, the patient was told to immediately notify me if he was experiencing any new onset of "ringing in the ears, or metallic taste in the mouth". No attempts were made at seeking any paresthesias. Safe injection practices and needle disposal techniques used. Medications properly checked for expiration dates. SDV (single dose vial) medications used. After the completion of the procedure, all disposable equipment used was discarded in the proper designated medical waste containers. Local Anesthesia: Protocol guidelines were followed. The patient was positioned over the fluoroscopy table. The area was prepped in the usual manner. The time-out was completed. The target area was identified using fluoroscopy. A 12-in long, straight, sterile  hemostat was used with fluoroscopic guidance to locate the targets for each level blocked. Once located, the skin was marked with an approved surgical skin marker. Once all sites were marked, the skin (epidermis, dermis, and hypodermis), as well as deeper tissues (fat, connective tissue and muscle) were infiltrated with a small amount of a short-acting local anesthetic, loaded on a 10cc syringe with a 25G, 1.5-in  Needle. An appropriate amount of time was allowed for local anesthetics to take effect before proceeding to the next step. Local Anesthetic: Lidocaine 2.0% The unused portion of the local anesthetic was discarded in the proper designated containers. Technical explanation of process:  Radiofrequency Ablation (RFA) L2 Medial Branch Nerve RFA: The target area for the L2 medial branch is at the junction of the postero-lateral aspect of the superior articular process and the superior, posterior, and medial edge of the transverse process of L3. Under fluoroscopic guidance, a Radiofrequency needle was inserted until contact was made with os over the superior postero-lateral aspect of the pedicular shadow (target area). Sensory and motor testing was conducted to properly adjust the position of the needle. Once satisfactory placement of the needle was achieved, the numbing solution was slowly injected after negative aspiration for blood. 2.0 mL of the nerve block solution was injected without difficulty or complication. After waiting for at least 3 minutes, the ablation was performed. Once completed, the needle was removed intact. L3 Medial Branch Nerve RFA: The target area for the L3 medial branch is at the junction of the postero-lateral aspect of the superior articular process and the superior, posterior, and medial edge of the transverse process of L4. Under fluoroscopic guidance, a Radiofrequency needle was inserted until contact was made with os over the superior postero-lateral aspect of the pedicular  shadow (target area). Sensory and motor testing was conducted to properly adjust the position of the needle. Once satisfactory placement of the needle was achieved, the numbing solution was slowly injected after negative aspiration for blood. 2.0 mL of the nerve block solution was injected without difficulty or complication. After waiting for at least 3 minutes, the ablation was performed. Once completed, the needle was removed  intact. L4 Medial Branch Nerve RFA: The target area for the L4 medial branch is at the junction of the postero-lateral aspect of the superior articular process and the superior, posterior, and medial edge of the transverse process of L5. Under fluoroscopic guidance, a Radiofrequency needle was inserted until contact was made with os over the superior postero-lateral aspect of the pedicular shadow (target area). Sensory and motor testing was conducted to properly adjust the position of the needle. Once satisfactory placement of the needle was achieved, the numbing solution was slowly injected after negative aspiration for blood. 2.0 mL of the nerve block solution was injected without difficulty or complication. After waiting for at least 3 minutes, the ablation was performed. Once completed, the needle was removed intact. L5 Medial Branch Nerve RFA: The target area for the L5 medial branch is at the junction of the postero-lateral aspect of the superior articular process of S1 and the superior, posterior, and medial edge of the sacral ala. Under fluoroscopic guidance, a Radiofrequency needle was inserted until contact was made with os over the superior postero-lateral aspect of the pedicular shadow (target area). Sensory and motor testing was conducted to properly adjust the position of the needle. Once satisfactory placement of the needle was achieved, the numbing solution was slowly injected after negative aspiration for blood. 2.0 mL of the nerve block solution was injected without  difficulty or complication. After waiting for at least 3 minutes, the ablation was performed. Once completed, the needle was removed intact. S1 Medial Branch Nerve RFA: The target area for the S1 medial branch is located inferior to the junction of the S1 superior articular process and the L5 inferior articular process, posterior, inferior, and lateral to the 6 o'clock position of the L5-S1 facet joint, just superior to the S1 posterior foramen. Under fluoroscopic guidance, the Radiofrequency needle was advanced until contact was made with os over the Target area. Sensory and motor testing was conducted to properly adjust the position of the needle. Once satisfactory placement of the needle was achieved, the numbing solution was slowly injected after negative aspiration for blood. 2.0 mL of the nerve block solution was injected without difficulty or complication. After waiting for at least 3 minutes, the ablation was performed. Once completed, the needle was removed intact. Radiofrequency lesioning (ablation):  Radiofrequency Generator: NeuroTherm [id number] Sensory Stimulation Parameters: 50 Hz was used to locate & identify the nerve, making sure that the needle was positioned such that there was no sensory stimulation below 0.3 V or above 0.7 V. Motor Stimulation Parameters: 2 Hz was used to evaluate the motor component. Care was taken not to lesion any nerves that demonstrated motor stimulation of the lower extremities at an output of less than 2.5 times that of the sensory threshold, or a maximum of 2.0 V. Lesioning Technique Parameters: Standard Radiofrequency settings. (Not bipolar or pulsed.) Temperature Settings: 80 degrees C Lesioning time: 60 seconds Intra-operative Compliance: Compliant Materials & Medications: Needle(s) (Electrode/Cannula) Type: Teflon-coated, curved tip, Radiofrequency needle(s) Gauge: 22G Length: 10cm Numbing solution: 0.2% PF-Ropivacaine + Triamcinolone (40 mg/mL) diluted to  a final concentration of 4 mg of Triamcinolone/mL of Ropivacaine The unused portion of the solution was discarded in the proper designated containers.  Once the entire procedure was completed, the treated area was cleaned, making sure to leave some of the prepping solution back to take advantage of its long term bactericidal properties.  Illustration of the posterior view of the lumbar spine and the posterior neural structures. Laminae of  L2 through S1 are labeled. DPRL5, dorsal primary ramus of L5; DPRS1, dorsal primary ramus of S1; DPR3, dorsal primary ramus of L3; FJ, facet (zygapophyseal) joint L3-L4; I, inferior articular process of L4; LB1, lateral branch of dorsal primary ramus of L1; IAB, inferior articular branches from L3 medial branch (supplies L4-L5 facet joint); IBP, intermediate branch plexus; MB3, medial branch of dorsal primary ramus of L3; NR3, third lumbar nerve root; S, superior articular process of L5; SAB, superior articular branches from L4 (supplies L4-5 facet joint also); TP3, transverse process of L3.  Vitals:   01/05/19 1016 01/05/19 1024 01/05/19 1034 01/05/19 1044  BP: 135/86 (!) 134/92 138/81 138/84  Pulse:      Resp: 16 17 16 16   Temp:  98.2 F (36.8 C)    TempSrc:      SpO2: 95% 95% 97% 97%  Weight:      Height:        Start Time: 0940 hrs. End Time:   hrs.  Imaging Guidance (Spinal):          Type of Imaging Technique: Fluoroscopy Guidance (Spinal) Indication(s): Assistance in needle guidance and placement for procedures requiring needle placement in or near specific anatomical locations not easily accessible without such assistance. Exposure Time: Please see nurses notes. Contrast: None used. Fluoroscopic Guidance: I was personally present during the use of fluoroscopy. "Tunnel Vision Technique" used to obtain the best possible view of the target area. Parallax error corrected before commencing the procedure. "Direction-depth-direction" technique used to  introduce the needle under continuous pulsed fluoroscopy. Once target was reached, antero-posterior, oblique, and lateral fluoroscopic projection used confirm needle placement in all planes. Images permanently stored in EMR. Interpretation: No contrast injected. I personally interpreted the imaging intraoperatively. Adequate needle placement confirmed in multiple planes. Permanent images saved into the patient's record.  Antibiotic Prophylaxis:   Anti-infectives (From admission, onward)   None     Indication(s): None identified  Post-operative Assessment:  Post-procedure Vital Signs:  Pulse/HCG Rate: 7874 Temp: 98.2 F (36.8 C) Resp: 16 BP: 138/84 SpO2: 97 %  EBL: None  Complications: No immediate post-treatment complications observed by team, or reported by patient.  Note: The patient tolerated the entire procedure well. A repeat set of vitals were taken after the procedure and the patient was kept under observation following institutional policy, for this type of procedure. Post-procedural neurological assessment was performed, showing return to baseline, prior to discharge. The patient was provided with post-procedure discharge instructions, including a section on how to identify potential problems. Should any problems arise concerning this procedure, the patient was given instructions to immediately contact us, at any time, without hesitation. In any case, we plan to contact the patient by telephone for a follow-up status report regarding this interventional procedure.  Comments:  No additional relevant information.  Plan of Care  Orders:  Orders Placed This Encounter  Procedures   Radiofrequency,Lumbar    Scheduling Instructions:     Side(s): Left-sided     Level: L3-4, L4-5, & L5-S1 Facets (L2, L3, L4, L5, & S1 Medial Branch Nerves)     Sedation: With Sedation     Timeframe: Today    Order Specific Question:   Where will this procedure be performed?    Answer:   [facility]  Pain Management   DG PAIN CLINIC C-ARM 1-60 MIN NO REPORT    Intraoperative interpretation by procedural physician at [place].    Standing Status:   Standing    Number  of Occurrences:   1    Order Specific Question:   Reason for exam:    Answer:   Assistance in needle guidance and placement for procedures requiring needle placement in or near specific anatomical locations not easily accessible without such assistance.   Informed Consent Details: Physician/Practitioner Attestation; Transcribe to consent form and obtain patient signature    Nursing Order: Transcribe to consent form and obtain patient signature. Note: Always confirm laterality of pain with @M @ @LNAME @, before procedure. Procedure: Lumbar Facet Radiofrequency Ablation Indication/Reason: Low Back Pain, with our without leg pain, due to Facet Joint Arthralgia (Joint Pain) known as Lumbar Facet Syndrome, secondary to Lumbar, and/or Lumbosacral Spondylosis (Arthritis of the Spine), without myelopathy or radiculopathy (Nerve Damage). Provider Attestation: I, Lannon Dossie Arbour, MD, (Pain Management Specialist), the physician/practitioner, attest that I have discussed with the patient the benefits, risks, side effects, alternatives, likelihood of achieving goals and potential problems during recovery for the procedure that I have provided informed consent.   Provide equipment / supplies at bedside    Equipment required: Single use, disposable, "Block Tray"    Standing Status:   Standing    Number of Occurrences:   1    Order Specific Question:   Specify    Answer:   Block Tray   Chronic Opioid Analgesic:  Oxycodone IR 5 mg, 1 tab PO q 6 hrs (20 mg/day of oxycodone) MME/day: 30 mg/day.   Medications ordered for procedure: Meds ordered this encounter  Medications   lidocaine (XYLOCAINE) 2 % (with pres) injection 400 mg   lactated ringers infusion 1,000 mL   midazolam (VERSED) 5 MG/5ML injection 1-2 mg    Make  sure Flumazenil is available in the pyxis when using this medication. If oversedation occurs, administer 0.2 mg IV over 15 sec. If after 45 sec no response, administer 0.2 mg again over 1 min; may repeat at 1 min intervals; not to exceed 4 doses (1 mg)   fentaNYL (SUBLIMAZE) injection 25-50 mcg    Make sure Narcan is available in the pyxis when using this medication. In the event of respiratory depression (RR< 8/min): Titrate NARCAN (naloxone) in increments of 0.1 to 0.2 mg IV at 2-3 minute intervals, until desired degree of reversal.   ropivacaine (PF) 2 mg/mL (0.2%) (NAROPIN) injection 9 mL   triamcinolone acetonide (KENALOG-40) injection 40 mg   oxyCODONE (OXY IR/ROXICODONE) 5 MG immediate release tablet    Sig: Take 1 tablet (5 mg total) by mouth every 6 (six) hours as needed for severe pain. Must last 30 days    Dispense:  120 tablet    Refill:  0    Chronic Pain: STOP Act (Not applicable) Fill 1 day early if closed on refill date. Do not fill until: 01/29/2019. To last until: 02/28/2019. Avoid benzodiazepines within 8 hours of opioids   oxyCODONE (OXY IR/ROXICODONE) 5 MG immediate release tablet    Sig: Take 1 tablet (5 mg total) by mouth every 6 (six) hours as needed for severe pain. Must last 30 days    Dispense:  120 tablet    Refill:  0    Chronic Pain: STOP Act (Not applicable) Fill 1 day early if closed on refill date. Do not fill until: 02/28/2019. To last until: 03/30/2019. Avoid benzodiazepines within 8 hours of opioids   HYDROcodone-acetaminophen (NORCO/VICODIN) 5-325 MG tablet    Sig: Take 1 tablet by mouth every 8 (eight) hours as needed for up to 7 days for severe pain. Must  last 7 days.    Dispense:  21 tablet    Refill:  0    For acute post-operative pain. Not to be refilled. Must last 7 days.   HYDROcodone-acetaminophen (NORCO/VICODIN) 5-325 MG tablet    Sig: Take 1 tablet by mouth every 8 (eight) hours as needed for up to 7 days for severe pain. Must last for 7  days.    Dispense:  21 tablet    Refill:  0    For acute post-operative pain. Not to be refilled. Must last 7 days.   Medications administered: We administered lidocaine, lactated ringers, midazolam, fentaNYL, ropivacaine (PF) 2 mg/mL (0.2%), and triamcinolone acetonide.  See the medical record for exact dosing, route, and time of administration.  Follow-up plan:   Return in about 6 weeks (around 02/16/2019) for (VV), (PP).       Considering:   Therapeutic bilateral lumbar facet RFA  (we will start on the left side) (scheduled for 12/10/2018)   PRN Procedures:   Palliative/therapeutic bilateral lumbar facet block #3       Recent Visits Date Type Provider Dept  12/10/18 Procedure visit Milinda Pointer, MD [facility]  11/30/18 Office Visit Milinda Pointer, MD [facility]  10/21/18 Office Visit Milinda Pointer, MD [facility]  Showing recent visits within past 90 days and meeting all other requirements   Today's Visits Date Type Provider Dept  01/05/19 Procedure visit Milinda Pointer, MD [facility]  Showing today's visits and meeting all other requirements   Future Appointments Date Type Provider Dept  01/11/19 Appointment Milinda Pointer, MD [facility]  02/15/19 Appointment Milinda Pointer, MD [facility]  03/29/19 Appointment Milinda Pointer, MD [facility]  Showing future appointments within next 90 days and meeting all other requirements   Disposition: Discharge home  Discharge Date & Time: 01/05/2019; 1047 hrs.   Primary Care Physician: Dorothyann Peng, NP Location: [facility] Note by: [name], MD Date: 01/05/2019; Time: 1:15 PM  Disclaimer:  Medicine is not an Chief Strategy Officer. The only guarantee in medicine is that nothing is guaranteed. It is important to note that the decision to proceed with this intervention was based on the  information collected from the patient. The Data and conclusions were drawn from the patient's questionnaire, the interview, and the physical examination. Because the information was provided in large part by the patient, it cannot be guaranteed that it has not been purposely or unconsciously manipulated. Every effort has been made to obtain as much relevant data as possible for this evaluation. It is important to note that the conclusions that lead to this procedure are derived in large part from the available data. Always take into account that the treatment will also be dependent on availability of resources and existing treatment guidelines, considered by other Pain Management Practitioners as being common knowledge and practice, at the time of the intervention. For Medico-Legal purposes, it is also important to point out that variation in procedural techniques and pharmacological choices are the acceptable norm. The indications, contraindications, technique, and results of the above procedure should only be interpreted and judged by a Board-Certified Interventional Pain Specialist with extensive familiarity and expertise in the same exact procedure and technique.

## 2019-01-05 NOTE — Patient Instructions (Signed)
___________________________________________________________________________________________  Post-Radiofrequency (RF) Discharge Instructions  You have just completed a Radiofrequency Neurotomy.  The following instructions will provide you with information and guidelines for self-care upon discharge.  If at any time you have questions or concerns please call your physician. DO NOT DRIVE YOURSELF!!  Instructions:  Apply ice: Fill a plastic sandwich bag with crushed ice. Cover it with a small towel and apply to injection site. Apply for 15 minutes then remove x 15 minutes. Repeat sequence on day of procedure, until you go to bed. The purpose is to minimize swelling and discomfort after procedure.  Apply heat: Apply heat to procedure site starting the day following the procedure. The purpose is to treat any soreness and discomfort from the procedure.  Food intake: No eating limitations, unless stipulated above.  Nevertheless, if you have had sedation, you may experience some nausea.  In this case, it may be wise to wait at least two hours prior to resuming regular diet.  Physical activities: Keep activities to a minimum for the first 8 hours after the procedure. For the first 24 hours after the procedure, do not drive a motor vehicle,  Operate heavy machinery, power tools, or handle any weapons.  Consider walking with the use of an assistive device or accompanied by an adult for the first 24 hours.  Do not drink alcoholic beverages including beer.  Do not make any important decisions or sign any legal documents. Go home and rest today.  Resume activities tomorrow, as tolerated.  Use caution in moving about as you may experience mild leg weakness.  Use caution in cooking, use of household electrical appliances and climbing steps.  Driving: If you have received any sedation, you are not allowed to drive for 24 hours after your procedure.  Blood thinner: Restart your blood thinner 6 hours after your  procedure. (Only for those taking blood thinners)  Insulin: As soon as you can eat, you may resume your normal dosing schedule. (Only for those taking insulin)  Medications: May resume pre-procedure medications.  Do not take any drugs, other than what has been prescribed to you.  Infection prevention: Keep procedure site clean and dry.  Post-procedure Pain Diary: Extremely important that this be done correctly and accurately. Recorded information will be used to determine the next step in treatment.  Pain evaluated is that of treated area only. Do not include pain from an untreated area.  Complete every hour, on the hour, for the initial 8 hours. Set an alarm to help you do this part accurately.  Do not go to sleep and have it completed later. It will not be accurate.  Follow-up appointment: Keep your follow-up appointment after the procedure. Usually 2 weeks for most procedures. (6 weeks in the case of radiofrequency.) Bring you pain diary.   Expect:  From numbing medicine (AKA: Local Anesthetics): Numbness or decrease in pain.  Onset: Full effect within 15 minutes of injected.  Duration: It will depend on the type of local anesthetic used. On the average, 1 to 8 hours.   From steroids: Decrease in swelling or inflammation. Once inflammation is improved, relief of the pain will follow.  Onset of benefits: Depends on the amount of swelling present. The more swelling, the longer it will take for the benefits to be seen. In some cases, up to 10 days.  Duration: Steroids will stay in the system x 2 weeks. Duration of benefits will depend on multiple posibilities including persistent irritating factors.  From procedure: Some  discomfort is to be expected once the numbing medicine wears off. This should be minimal if ice and heat are applied as instructed.  Call if:  You experience numbness and weakness that gets worse with time, as opposed to wearing off.  He experience any unusual  bleeding, difficulty breathing, or loss of the ability to control your bowel and bladder. (This applies to Spinal procedures only)  You experience any redness, swelling, heat, red streaks, elevated temperature, fever, or any other signs of a possible infection.  Emergency Numbers:  [place] business hours (Monday - Thursday, 8:00 AM - 4:00 PM) (Friday, 9:00 AM - 12:00 Noon): [phone]  After hours: [phone] ____________________________________________________________________________________________

## 2019-01-06 ENCOUNTER — Telehealth: Payer: Self-pay

## 2019-01-06 NOTE — Telephone Encounter (Signed)
Post procedure phone call. Patient states he is doing well.

## 2019-01-10 ENCOUNTER — Encounter: Payer: Self-pay | Admitting: Pain Medicine

## 2019-01-10 NOTE — Progress Notes (Signed)
Pain Management Virtual Encounter Note - Virtual Visit via Telephone Telehealth (real-time audio visits between healthcare provider and patient).   Patient's Phone No. & Preferred Pharmacy:  [phone] (home); [phone] (mobile); (Preferred) [phone] [email or web address]  [place], [place] - [address] [facility] [address] [place] [address] Phone: [phone] Fax: [phone]    Pre-screening note:  Our staff contacted Chad Wilson and offered him an "in person", "face-to-face" appointment versus a telephone encounter. He indicated preferring the telephone encounter, at this time.   Reason for Virtual Visit: COVID-19*  Social distancing based on CDC and AMA recommendations.   I contacted Chad Wilson on 01/11/2019 via telephone.      I clearly identified myself as Chad Cola, MD. I verified that I was speaking with the correct person using two identifiers (Name: Chad Wilson, and date of birth: 1975-11-06).  Advanced Informed Consent I sought verbal advanced consent from Chad Aube for virtual visit interactions. I informed [name] of possible security and privacy concerns, risks, and limitations associated with providing "not-in-person" medical evaluation and management services. I also informed Mr. Nearhood of the availability of "in-person" appointments. Finally, I informed him that there would be a charge for the virtual visit and that he could be  personally, fully or partially, financially responsible for it. [name] expressed understanding and agreed to proceed.   Historic Elements   Mr. Chad Wuebker is a 43 y.o. year old, male patient evaluated today after his last encounter by our practice on 01/06/2019. Chad Wilson  has a past medical history of Acute hepatitis C without mention of hepatic coma(070.51), Acute postoperative pain (12/10/2018), Alcohol abuse, unspecified, Anxiety and depression, Anxiety states, Essential  hypertension, benign, Graves disease, Panic disorder without agoraphobia, Thyroid disease, and Tobacco use disorder. He also  has a past surgical history that includes Multiple tooth extractions (15 yrs. ago) and Anterior cervical decomp/discectomy fusion (N/A, 05/21/2017). Chad Wilson has a current medication list which includes the following prescription(s): albuterol, atorvastatin, clonazepam, depo-testosterone, hydrocodone-acetaminophen, levothyroxine, lisinopril, oxycodone, oxycodone, pantoprazole, pregabalin, pregabalin, pregabalin, promethazine, sildenafil, pregabalin, and omeprazole. He  reports that he has been smoking cigarettes. He has a 30.00 pack-year smoking history. His smokeless tobacco use includes chew. He reports current alcohol use of about 42.0 standard drinks of alcohol per week. He reports previous drug use. Chad Wilson is allergic to chantix [varenicline].   HPI  Today, he is being contacted for both, medication management and a post-procedure assessment.  The patient indicates doing rather well with the radiofrequency ablation where he has absolutely no pain on the left side and on the right side he is having pain only when he is doing work where he is having to stand for prolonged periods of time.  This is probably due to the fact that the right side was done on 01/05/2019 and therefore its been only 6 days and it usually takes about 6 weeks for the radiofrequency to fully heal.  Today I asked him about the Lyrica and whether or not he was taking it.  He was not sure about what medicine this was and when I looked at his PMP, it looks like the last time he had it filled was for the 50 mg.  Apparently he has not been picking up his prescription for the titration.  I have reminded him that he needs to do this and I have called the pharmacy and canceled all the other prescriptions that he had pending and  I rewrote them so asked to have them last 30 days so that it will be easier for him to get  them filled and titrate the medication up.  I will remain available to him in the event that he needs to give me a call for any adjustments.  Today I have reminded him that as he gets better, he needs to start going down on his opioid analgesics.  He understood and accepted.  Post-Procedure Evaluation  Procedure(Left on 12/10/2018 & right of 01/05/2019): Therapeutic bilateral lumbar facet RFA #1 under fluoroscopic guidance and IV sedation. Pre-procedure pain level:  6/10 Post-procedure: 0/10 (100% relief)  Sedation: Sedation provided.  Effectiveness during initial hour after procedure(Ultra-Short Term Relief): 100 %   Local anesthetic used: Long-acting (4-6 hours) Effectiveness: Defined as any analgesic benefit obtained secondary to the administration of local anesthetics. This carries significant diagnostic value as to the etiological location, or anatomical origin, of the pain. Duration of benefit is expected to coincide with the duration of the local anesthetic used.  Effectiveness during initial 4-6 hours after procedure(Short-Term Relief): 100 %   Long-term benefit: Defined as any relief past the pharmacologic duration of the local anesthetics.  Effectiveness past the initial 6 hours after procedure(Long-Term Relief): 50 %   Current benefits: Defined as benefit that persist at this time.   Analgesia:  90-100% better Function: Chad Wilson reports improvement in function ROM: Chad Wilson reports improvement in ROM  Pharmacotherapy Assessment  Analgesic: Oxycodone IR 5 mg, 1 tab PO q 6 hrs (20 mg/day of oxycodone) MME/day: 30 mg/day.   Monitoring: Pharmacotherapy: No side-effects or adverse reactions reported. [place] PMP: PDMP reviewed during this encounter.       Compliance: No problems identified. Effectiveness: Clinically acceptable. Plan: Refer to "POC".  UDS:  Summary  Date Value Ref Range Status  06/03/2018 FINAL  Final    Comment:     ==================================================================== TOXASSURE COMP DRUG ANALYSIS,UR ==================================================================== Test                             Result       Flag       Units Drug Present   Lorazepam                      219                     ng/mg creat    Source of lorazepam is a scheduled prescription medication.   Hydrocodone                    1871                    ng/mg creat   Hydromorphone                  463                     ng/mg creat   Dihydrocodeine                 252                     ng/mg creat   Norhydrocodone                 1617  ng/mg creat    Sources of hydrocodone include scheduled prescription    medications. Hydromorphone, dihydrocodeine and norhydrocodone are    expected metabolites of hydrocodone. Hydromorphone and    dihydrocodeine are also available as scheduled prescription    medications.   Zolpidem                       PRESENT   Zolpidem Acid                  PRESENT    Zolpidem acid is an expected metabolite of zolpidem.   Sertraline                     PRESENT   Desmethylsertraline            PRESENT    Desmethylsertraline is an expected metabolite of sertraline.   Acetaminophen                  PRESENT ==================================================================== Test                      Result    Flag   Units      Ref Range   Creatinine              52               mg/dL      >=20 ==================================================================== Declared Medications:  Medication list was not provided. ==================================================================== For clinical consultation, please call [phone]. ====================================================================    Laboratory Chemistry Profile (12 mo)  Renal: 06/03/2018: BUN/Creatinine Ratio 8 10/05/2018: BUN 17; Creatinine, Ser 1.07  Lab Results  Component Value Date    GFR 74.96 02/17/2018   GFRAA >60 10/05/2018   GFRNONAA >60 10/05/2018   Hepatic: 10/05/2018: Albumin 4.0 Lab Results  Component Value Date   AST 22 10/05/2018   ALT 56 (H) 10/05/2018   Other: 02/17/2018: Vitamin B-12 147; VITD 21.01 06/03/2018: CRP 1; Sed Rate 21 Note: Above Lab results reviewed.  Imaging  Last 90 days:  Ct Abdomen Pelvis W Contrast  Result Date: 11/25/2018 CLINICAL DATA:  Abdominal distension and pain EXAM: CT ABDOMEN AND PELVIS WITH CONTRAST TECHNIQUE: Multidetector CT imaging of the abdomen and pelvis was performed using the standard protocol following bolus administration of intravenous contrast. Oral contrast was also administered. CONTRAST:  114m ISOVUE-300 IOPAMIDOL (ISOVUE-300) INJECTION 61% COMPARISON:  Ultrasound right upper quadrant October 05, 2018 FINDINGS: Lower chest: There is atelectatic change in the left base region. There is no lung base edema or consolidation. Hepatobiliary: Liver measures 22.4 cm in length. No focal liver lesions are demonstrable by CT. Gallbladder wall is not appreciably thickened. There is no evident biliary duct dilatation. Pancreas: There is no pancreatic mass or inflammatory focus. Spleen: No splenic lesions are evident. Adrenals/Urinary Tract: Adrenals bilaterally appear unremarkable. Kidneys bilaterally show no evident mass or hydronephrosis on either side. There is no evident renal or ureteral calculus on either side. Urinary bladder is midline with wall thickness within normal limits. Stomach/Bowel: There is mild thickening of the wall of the mid to distal sigmoid colon and upper rectum. There is no diverticulitis in this area. No associated mesenteric thickening. Elsewhere, there is no bowel wall or mesenteric thickening. No evident bowel obstruction. The terminal ileum appears unremarkable. There is lipomatous infiltration of the ileocecal valve. No free air or portal venous air evident. Vascular/Lymphatic: No abdominal aortic aneurysm.  There is aortic and iliac  artery atherosclerosis. Major mesenteric arterial vessels are patent. There is no evident adenopathy in the abdomen or pelvis. Reproductive: Prostate and seminal vesicles are normal in size and contour. No pelvic mass evident. Other: The appendix appears normal. There is no abscess or ascites in the abdomen or pelvis. Musculoskeletal: No blastic or lytic bone lesions. No intramuscular or abdominal wall lesions evident. IMPRESSION: 1. Wall thickening in portions of the distal sigmoid and upper rectum consistent with distal colitis. There may be mild proctitis in the upper rectum as well. There is no pericolonic or perirectal soft tissue stranding or fluid. No abscess in these areas. No bowel wall thickening elsewhere. No bowel obstruction evident. 2.  No abscess in the abdomen or pelvis.  Appendix appears normal. 3. No evident renal or ureteral calculus. No hydronephrosis. Urinary bladder wall thickness normal. 4.  Prominent liver without focal liver lesion evident by CT. 5.  Aortoiliac atherosclerosis. Electronically Signed   By: Lowella Grip III M.D.   On: 11/25/2018 14:04   [facility] C-arm 1-60 Min No Report  Result Date: 01/05/2019 Fluoro was used, but no Radiologist interpretation will be provided. Please refer to "NOTES" tab for provider progress note.  Dg Pain Clinic C-arm 1-60 Min No Report  Result Date: 12/10/2018 Fluoro was used, but no Radiologist interpretation will be provided. Please refer to "NOTES" tab for provider progress note.   Assessment  The primary encounter diagnosis was Chronic pain syndrome. Diagnoses of Chronic low back pain (Primary area of Pain) (Bilateral) (R=L) w/o sciatica, Lumbar facet syndrome (Bilateral), DDD (degenerative disc disease), lumbosacral, Grade 1 Lumbosacral Retrolisthesis of L5/S1, and Neurogenic pain were also pertinent to this visit.  Plan of Care  I have discontinued Chad Caison's pregabalin. I have also changed his  pregabalin, pregabalin, pregabalin, and pregabalin. Additionally, I am having him maintain his Depo-Testosterone, albuterol, levothyroxine, sildenafil, promethazine, lisinopril, atorvastatin, pantoprazole, oxyCODONE, oxyCODONE, HYDROcodone-acetaminophen, and clonazePAM.  Pharmacotherapy (Medications Ordered): Meds ordered this encounter  Medications  . pregabalin (LYRICA) 50 MG capsule    Sig: Take 1 capsule (50 mg total) by mouth 3 (three) times daily.    Dispense:  90 capsule    Refill:  0    Med titration/taper: Follow specific schedule, avoid dispensing out of order. Fill 1 day early if closed on scheduled date. Fill on: 01/11/2019  . pregabalin (LYRICA) 75 MG capsule    Sig: Take 1 capsule (75 mg total) by mouth 3 (three) times daily.    Dispense:  90 capsule    Refill:  0    Med titration/taper: Follow specific schedule, avoid dispensing out of order. Fill 1 day early if closed on scheduled date. Fill on: 02/10/2019  . pregabalin (LYRICA) 100 MG capsule    Sig: Take 1 capsule (100 mg total) by mouth 3 (three) times daily.    Dispense:  90 capsule    Refill:  0    Med titration/taper: Follow specific schedule, avoid dispensing out of order. Fill 1 day early if closed on scheduled date. Fill on: 03/12/2019  . pregabalin (LYRICA) 150 MG capsule    Sig: Take 1 capsule (150 mg total) by mouth 3 (three) times daily.    Dispense:  90 capsule    Refill:  0    Med titration/taper: Follow specific schedule, avoid dispensing out of order. Fill 1 day early if closed on scheduled date. Fill on: 04/11/2019   Orders:  No orders of the defined types were placed in this encounter.  Follow-up plan:   Return in about 11 weeks (around 03/29/2019) for (VV), (MM) to review Lyrica titration and to see if he has been able to come off of the oxycodone.      Considering:   Therapeutic bilateral lumbar facet RFA  (we will start on the left side) (scheduled for 12/10/2018)   PRN Procedures:    Palliative/therapeutic bilateral lumbar facet block #3  Palliative right-sided lumbar facet RFA #2 (last done 12/10/2018) (100/100/100/100) Palliative left-sided lumbar facet RFA #2 (last done 01/05/2019) (100/100/50/90-100)    Recent Visits Date Type Provider Dept  01/05/19 Procedure visit Milinda Pointer, MD [facility]  12/10/18 Procedure visit Milinda Pointer, MD [facility]  11/30/18 Office Visit Milinda Pointer, MD [facility]  10/21/18 Office Visit Milinda Pointer, MD [facility]  Showing recent visits within past 90 days and meeting all other requirements   Today's Visits Date Type Provider Dept  01/11/19 Office Visit Milinda Pointer, MD [facility]  Showing today's visits and meeting all other requirements   Future Appointments Date Type Provider Dept  02/15/19 Appointment Milinda Pointer, MD [facility]  03/29/19 Appointment Milinda Pointer, MD [facility]  Showing future appointments within next 90 days and meeting all other requirements   I discussed the assessment and treatment plan with the patient. The patient was provided an opportunity to ask questions and all were answered. The patient agreed with the plan and demonstrated an understanding of the instructions.  Patient advised to call back or seek an in-person evaluation if the symptoms or condition worsens.  Total duration of non-face-to-face encounter: 13 minutes.  Note by: Chad Cola, MD Date: 01/11/2019; Time: 11:54 AM  Note: This dictation was prepared with Dragon dictation. Any transcriptional errors that may result from this process are unintentional.  Disclaimer:  * Given the special circumstances of the COVID-19 pandemic, the federal government has announced that the Office for Civil Rights (OCR) will exercise its enforcement discretion and will not impose penalties on physicians using telehealth in the  event of noncompliance with regulatory requirements under the [place] (HIPAA) in connection with the good faith provision of telehealth during the XQJJH-41 national public health emergency. ([place])

## 2019-01-11 ENCOUNTER — Other Ambulatory Visit: Payer: Self-pay

## 2019-01-11 ENCOUNTER — Ambulatory Visit: Payer: 59 | Attending: Pain Medicine | Admitting: Pain Medicine

## 2019-01-11 DIAGNOSIS — M47816 Spondylosis without myelopathy or radiculopathy, lumbar region: Secondary | ICD-10-CM

## 2019-01-11 DIAGNOSIS — M545 Low back pain: Secondary | ICD-10-CM

## 2019-01-11 DIAGNOSIS — G8929 Other chronic pain: Secondary | ICD-10-CM

## 2019-01-11 DIAGNOSIS — M4310 Spondylolisthesis, site unspecified: Secondary | ICD-10-CM

## 2019-01-11 DIAGNOSIS — M5137 Other intervertebral disc degeneration, lumbosacral region: Secondary | ICD-10-CM

## 2019-01-11 DIAGNOSIS — M792 Neuralgia and neuritis, unspecified: Secondary | ICD-10-CM

## 2019-01-11 DIAGNOSIS — G894 Chronic pain syndrome: Secondary | ICD-10-CM

## 2019-01-11 MED ORDER — PREGABALIN 50 MG PO CAPS: 50.0000 mg | ORAL_CAPSULE | Freq: Three times a day (TID) | ORAL | 0 refills | Status: DC

## 2019-01-11 MED ORDER — PREGABALIN 100 MG PO CAPS: 100.0000 mg | ORAL_CAPSULE | Freq: Three times a day (TID) | ORAL | 0 refills | Status: DC

## 2019-01-11 MED ORDER — PREGABALIN 150 MG PO CAPS: 150.0000 mg | ORAL_CAPSULE | Freq: Three times a day (TID) | ORAL | 0 refills | Status: DC

## 2019-01-11 MED ORDER — PREGABALIN 75 MG PO CAPS: 75.0000 mg | ORAL_CAPSULE | Freq: Three times a day (TID) | ORAL | 0 refills | Status: DC

## 2019-01-11 MED FILL — PREGABALIN 50 MG CAPS: 50 | 30 days supply | Qty: 90 | Fill #0

## 2019-01-12 ENCOUNTER — Encounter: Payer: Self-pay | Admitting: Gastroenterology

## 2019-01-14 ENCOUNTER — Other Ambulatory Visit: Payer: Self-pay | Admitting: Adult Health

## 2019-01-14 MED FILL — LISINOPRIL 40 MG TABS: 40 | 90 days supply | Qty: 90 | Fill #0

## 2019-01-15 ENCOUNTER — Ambulatory Visit (AMBULATORY_SURGERY_CENTER): Payer: 59 | Admitting: Gastroenterology

## 2019-01-15 ENCOUNTER — Other Ambulatory Visit: Payer: Self-pay

## 2019-01-15 ENCOUNTER — Encounter: Payer: Self-pay | Admitting: Gastroenterology

## 2019-01-15 ENCOUNTER — Other Ambulatory Visit: Payer: Self-pay | Admitting: Gastroenterology

## 2019-01-15 VITALS — BP 113/65 | HR 67 | Temp 97.7°F | Resp 13 | Ht 66.0 in | Wt 202.0 lb

## 2019-01-15 DIAGNOSIS — K529 Noninfective gastroenteritis and colitis, unspecified: Secondary | ICD-10-CM

## 2019-01-15 DIAGNOSIS — K2950 Unspecified chronic gastritis without bleeding: Secondary | ICD-10-CM

## 2019-01-15 DIAGNOSIS — K219 Gastro-esophageal reflux disease without esophagitis: Secondary | ICD-10-CM | POA: Diagnosis not present

## 2019-01-15 DIAGNOSIS — I10 Essential (primary) hypertension: Secondary | ICD-10-CM | POA: Diagnosis not present

## 2019-01-15 DIAGNOSIS — R194 Change in bowel habit: Secondary | ICD-10-CM

## 2019-01-15 DIAGNOSIS — R933 Abnormal findings on diagnostic imaging of other parts of digestive tract: Secondary | ICD-10-CM

## 2019-01-15 DIAGNOSIS — F419 Anxiety disorder, unspecified: Secondary | ICD-10-CM | POA: Diagnosis not present

## 2019-01-15 NOTE — Op Note (Signed)
[facility] Patient Name: Chad Wilson Procedure Date: 01/15/2019 1:41 PM MRN: [id number] Endoscopist: Milus Banister , MD Age: 43 Referring MD:  Date of Birth: 05-16-1975 Gender: Male Account #: [id number] Procedure:                Colonoscopy Indications:              Chronic loose stools for 6 months, thickened                            sigmoid and rectum on recent CT scan Medicines:                Monitored Anesthesia Care Procedure:                Pre-Anesthesia Assessment:                           - Prior to the procedure, a History and Physical                            was performed, and patient medications and                            allergies were reviewed. The patient's tolerance of                            previous anesthesia was also reviewed. The risks                            and benefits of the procedure and the sedation                            options and risks were discussed with the patient.                            All questions were answered, and informed consent                            was obtained. Prior Anticoagulants: The patient has                            taken no previous anticoagulant or antiplatelet                            agents. ASA Grade Assessment: II - A patient with                            mild systemic disease. After reviewing the risks                            and benefits, the patient was deemed in                            satisfactory condition to undergo the procedure.  After obtaining informed consent, the colonoscope                            was passed under direct vision. Throughout the                            procedure, the patient's blood pressure, pulse, and                            oxygen saturations were monitored continuously. The                            Colonoscope was introduced through the anus and                            advanced to the the terminal ileum.  The colonoscopy                            was performed without difficulty. The patient                            tolerated the procedure well. The quality of the                            bowel preparation was good. The ileocecal valve,                            appendiceal orifice, and rectum were photographed. Scope In: 1:51:20 PM Scope Out: 2:01:50 PM Scope Withdrawal Time: 0 hours 7 minutes 0 seconds  Total Procedure Duration: 0 hours 10 minutes 30 seconds  Findings:                 The terminal ileum appeared normal.                           The colonic mucosa was normal and random biopsies                            were taken from the right and left colon. Jar 1.                           The rectosigmoid mucosa was slightly erythematous                            but not overtly inflammed, biopsies taken. Jar 2.                           The examination was otherwise normal. Complications:            No immediate complications. Estimated blood loss:                            None. Estimated Blood Loss:     Estimated blood loss: none. Impression:               -  The examined portion of the ileum was normal.The                            colonic mucosa was normal and random biopsies were                            taken from the right and left colon. Jar 1.                           - The rectosigmoid mucosa was slightly erythematous                            but not overtly inflammed, biopsies taken. Jar 2.                           - The examination was otherwise normal. Recommendation:           - EGD now.                           - Await pathology results. Alcohol can cause                            chronic loose stools. For now please start taking a                            single OTC imodium every morning shortly after                            waking. Milus Banister, MD 01/15/2019 2:15:11 PM This report has been signed electronically.

## 2019-01-15 NOTE — Progress Notes (Signed)
Report to PACU, RN, vss, BBS= Clear.  

## 2019-01-15 NOTE — Progress Notes (Signed)
Called to room to assist during endoscopic procedure.  Patient ID and intended procedure confirmed with present staff. Received instructions for my participation in the procedure from the performing physician.  

## 2019-01-15 NOTE — Op Note (Signed)
[facility] Patient Name: Chad Wilson Procedure Date: 01/15/2019 1:41 PM MRN: [id number] Endoscopist: Milus Banister , MD Age: 43 Referring MD:  Date of Birth: Feb 21, 1976 Gender: Male Account #: [id number] Procedure:                Upper GI endoscopy Indications:              Heartburn Medicines:                Monitored Anesthesia Care Procedure:                Pre-Anesthesia Assessment:                           - Prior to the procedure, a History and Physical                            was performed, and patient medications and                            allergies were reviewed. The patient's tolerance of                            previous anesthesia was also reviewed. The risks                            and benefits of the procedure and the sedation                            options and risks were discussed with the patient.                            All questions were answered, and informed consent                            was obtained. Prior Anticoagulants: The patient has                            taken no previous anticoagulant or antiplatelet                            agents. ASA Grade Assessment: II - A patient with                            mild systemic disease. After reviewing the risks                            and benefits, the patient was deemed in                            satisfactory condition to undergo the procedure.                           After obtaining informed consent, the endoscope was  passed under direct vision. Throughout the                            procedure, the patient's blood pressure, pulse, and                            oxygen saturations were monitored continuously. The                            Endoscope was introduced through the mouth, and                            advanced to the second part of duodenum. The upper                            GI endoscopy was accomplished without difficulty.                             The patient tolerated the procedure well. 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 Biopsies were taken with a cold forceps                            for histology.                           The exam was otherwise without abnormality. Complications:            No immediate complications. Estimated blood loss:                            None. Estimated Blood Loss:     Estimated blood loss: none. Impression:               - Gastritis. Biopsied.                           - The examination was otherwise normal. Recommendation:           - Patient has a contact number available for                            emergencies. The signs and symptoms of potential                            delayed complications were discussed with the  patient. Return to normal activities tomorrow.                            Written discharge instructions were provided to the                            patient.                           - Resume previous diet.                           - Continue present medications. Continue once daily                            pantoprazole since this seems to have really helped                            your heartburn.                           - Await pathology results. Milus Banister, MD 01/15/2019 2:17:11 PM This report has been signed electronically.

## 2019-01-15 NOTE — Patient Instructions (Signed)
YOU HAD AN ENDOSCOPIC PROCEDURE TODAY AT [facility]:   Refer to the procedure report that was given to you for any specific questions about what was found during the examination.  If the procedure report does not answer your questions, please call your gastroenterologist to clarify.  If you requested that your care partner not be given the details of your procedure findings, then the procedure report has been included in a sealed envelope for you to review at your convenience later.  YOU SHOULD EXPECT: Some feelings of bloating in the abdomen. Passage of more gas than usual.  Walking can help get rid of the air that was put into your GI tract during the procedure and reduce the bloating. If you had a lower endoscopy (such as a colonoscopy or flexible sigmoidoscopy) you may notice spotting of blood in your stool or on the toilet paper. If you underwent a bowel prep for your procedure, you may not have a normal bowel movement for a few days.  Please Note:  You might notice some irritation and congestion in your nose or some drainage.  This is from the oxygen used during your procedure.  There is no need for concern and it should clear up in a day or so.  SYMPTOMS TO REPORT IMMEDIATELY:   Following lower endoscopy (colonoscopy or flexible sigmoidoscopy):  Excessive amounts of blood in the stool  Significant tenderness or worsening of abdominal pains  Swelling of the abdomen that is new, acute  Fever of 100F or higher   Following upper endoscopy (EGD)  Vomiting of blood or coffee ground material  New chest pain or pain under the shoulder blades  Painful or persistently difficult swallowing  New shortness of breath  Fever of 100F or higher  Black, tarry-looking stools  For urgent or emergent issues, a gastroenterologist can be reached at any hour by calling [phone].   DIET:  We do recommend a small meal at first, but then you may proceed to your regular diet.  Drink  plenty of fluids but you should avoid alcoholic beverages for 24 hours.  MEDICATIONS: Continue present medications. Continue once daily pantoprazole since this seems to have really helped your heartburn. Alcohol can cause chronic loose stools. For now, please start taking a single over the counter Imodium tablet every morning shortly after waking.  Please see handouts given to you by your recovery nurse.  ACTIVITY:  You should plan to take it easy for the rest of today and you should NOT DRIVE or use heavy machinery until tomorrow (because of the sedation medicines used during the test).    FOLLOW UP: Our staff will call the number listed on your records 48-72 hours following your procedure to check on you and address any questions or concerns that you may have regarding the information given to you following your procedure. If we do not reach you, we will leave a message.  We will attempt to reach you two times.  During this call, we will ask if you have developed any symptoms of COVID 19. If you develop any symptoms (ie: fever, flu-like symptoms, shortness of breath, cough etc.) before then, please call [phone].  If you test positive for Covid 19 in the 2 weeks post procedure, please call and report this information to [place].    If any biopsies were taken you will be contacted by phone or by letter within the next 1-3 weeks.  Please call us at [phone] if you  have not heard about the biopsies in 3 weeks.   Thank you for allowing [place] to provide for your healthcare needs today.   SIGNATURES/CONFIDENTIALITY: You and/or your care partner have signed paperwork which will be entered into your electronic medical record.  These signatures attest to the fact that that the information above on your After Visit Summary has been reviewed and is understood.  Full responsibility of the confidentiality of this discharge information lies with you and/or your care-partner.

## 2019-01-15 NOTE — Progress Notes (Signed)
Temp JB V/S CW

## 2019-01-15 NOTE — Telephone Encounter (Signed)
It looks like this was D/c in June by pain management because the patient was not taking it?

## 2019-01-19 ENCOUNTER — Encounter: Payer: Self-pay | Admitting: Adult Health

## 2019-01-19 ENCOUNTER — Other Ambulatory Visit: Payer: Self-pay

## 2019-01-19 ENCOUNTER — Telehealth (INDEPENDENT_AMBULATORY_CARE_PROVIDER_SITE_OTHER): Payer: 59 | Admitting: Adult Health

## 2019-01-19 ENCOUNTER — Telehealth: Payer: Self-pay

## 2019-01-19 DIAGNOSIS — F329 Major depressive disorder, single episode, unspecified: Secondary | ICD-10-CM

## 2019-01-19 DIAGNOSIS — F419 Anxiety disorder, unspecified: Secondary | ICD-10-CM

## 2019-01-19 DIAGNOSIS — F32A Depression, unspecified: Secondary | ICD-10-CM

## 2019-01-19 MED ORDER — SERTRALINE HCL 100 MG PO TABS: 200.0000 mg | ORAL_TABLET | Freq: Every day | ORAL | 1 refills | Status: DC

## 2019-01-19 MED FILL — SERTRALINE HCL 100 MG TAB: 100 | 90 days supply | Qty: 180 | Fill #0

## 2019-01-19 MED FILL — TESTOSTERONE CYP 200 MG/ML: 200 | 56 days supply | Qty: 4 | Fill #1

## 2019-01-19 NOTE — Progress Notes (Signed)
Virtual Visit via Video Note  I connected with Chad Wilson  on 01/19/19 at  3:30 PM EST by a video enabled telemedicine application and verified that I am speaking with the correct person using two identifiers.  Location patient: home Location provider:work or home office Persons participating in the virtual visit: patient, provider  I discussed the limitations of evaluation and management by telemedicine and the availability of in person appointments. The patient expressed understanding and agreed to proceed.   HPI: 43 -year-old male who is being evaluated today for anxiety and depression.  He is currently prescribed Zoloft 200 mg a day and has run out of the medication a couple of days ago.  He needs a refill.  He feels as though he is well controlled on this as well as when he takes his Klonopin.  He does not wish to change medications.  He denies suicidal ideation or depression.   ROS: See pertinent positives and negatives per HPI.  Past Medical History:  Diagnosis Date  . Acute hepatitis C without mention of hepatic coma(070.51)    has been cleared   . Acute postoperative pain 12/10/2018  . Alcohol abuse, unspecified   . Anxiety and depression   . Anxiety states   . Essential hypertension, benign   . GERD (gastroesophageal reflux disease)   . Graves disease    treated by Dr Loanne Drilling  . Panic disorder without agoraphobia   . Thyroid disease    Graves Disease  . Tobacco use disorder     Past Surgical History:  Procedure Laterality Date  . ANTERIOR CERVICAL DECOMP/DISCECTOMY FUSION N/A 05/21/2017   Procedure: Anterior Cervical Decompression Fusion Cervical five-six, Cervical six-seven;  Surgeon: Eustace Moore, MD;  Location: [facility];  Service: Neurosurgery;  Laterality: N/A;  . MULTIPLE TOOTH EXTRACTIONS  15 yrs. ago  . WISDOM TOOTH EXTRACTION      Family History  Problem Relation Age of Onset  . Anxiety disorder Father   . Heart disease Father   . Hypertension Father   .  Thyroid disease Mother   . Anxiety disorder Sister   . Hyperthyroidism Sister   . Colon cancer Neg Hx   . Esophageal cancer Neg Hx   . Pancreatic cancer Neg Hx        Current Outpatient Medications:  .  atorvastatin (LIPITOR) 20 MG tablet, TAKE 1 TABLET (20 MG TOTAL) BY MOUTH DAILY., Disp: 90 tablet, Rfl: 0 .  clonazePAM (KLONOPIN) 0.5 MG tablet, TAKE 1 TABLET (0.5 MG TOTAL) BY MOUTH TWICE A DAY AS NEEDED FOR ANXIETY., Disp: 30 tablet, Rfl: 0 .  DEPO-TESTOSTERONE 200 MG/ML injection, Take 200 mg by mouth every 14 (fourteen) days., Disp: , Rfl:  .  HYDROcodone-acetaminophen (NORCO/VICODIN) 5-325 MG tablet, Take 1 tablet by mouth every 8 (eight) hours as needed for up to 7 days for severe pain. Must last for 7 days., Disp: 21 tablet, Rfl: 0 .  levothyroxine (SYNTHROID) 150 MCG tablet, TAKE 1 TABLET BY MOUTH DAILY., Disp: 90 tablet, Rfl: 3 .  lisinopril (ZESTRIL) 40 MG tablet, TAKE 1 TABLET BY MOUTH DAILY, Disp: 90 tablet, Rfl: 0 .  [START ON 01/29/2019] oxyCODONE (OXY IR/ROXICODONE) 5 MG immediate release tablet, Take 1 tablet (5 mg total) by mouth every 6 (six) hours as needed for severe pain. Must last 30 days, Disp: 120 tablet, Rfl: 0 .  [START ON 02/28/2019] oxyCODONE (OXY IR/ROXICODONE) 5 MG immediate release tablet, Take 1 tablet (5 mg total) by mouth every 6 (six)  hours as needed for severe pain. Must last 30 days, Disp: 120 tablet, Rfl: 0 .  pantoprazole (PROTONIX) 40 MG tablet, Take 1 tablet (40 mg total) by mouth daily., Disp: 30 tablet, Rfl: 5 .  [START ON 04/11/2019] pregabalin (LYRICA) 150 MG capsule, Take 1 capsule (150 mg total) by mouth 3 (three) times daily., Disp: 90 capsule, Rfl: 0 .  sildenafil (REVATIO) 20 MG tablet, , Disp: , Rfl:  .  albuterol (VENTOLIN HFA) 108 (90 Base) MCG/ACT inhaler, Inhale 2 puffs into the lungs every 6 (six) hours as needed for wheezing or shortness of breath. (Patient not taking: Reported on 01/19/2019), Disp: 1 Inhaler, Rfl: 1 .  promethazine  (PHENERGAN) 12.5 MG tablet, Take 1 tablet (12.5 mg total) by mouth every 6 (six) hours as needed for nausea or vomiting. (Patient not taking: Reported on 01/15/2019), Disp: 12 tablet, Rfl: 0 .  sertraline (ZOLOFT) 100 MG tablet, Take 2 tablets (200 mg total) by mouth daily., Disp: 180 tablet, Rfl: 1  EXAM:  VITALS per patient if applicable:  GENERAL: alert, oriented, appears well and in no acute distress  HEENT: atraumatic, conjunttiva clear, no obvious abnormalities on inspection of external nose and ears  NECK: normal movements of the head and neck  LUNGS: on inspection no signs of respiratory distress, breathing rate appears normal, no obvious gross SOB, gasping or wheezing  CV: no obvious cyanosis  MS: moves all visible extremities without noticeable abnormality  PSYCH/NEURO: pleasant and cooperative, no obvious depression or anxiety, speech and thought processing grossly intact  ASSESSMENT AND PLAN:  Discussed the following assessment and plan:  1. Anxiety and depression - sertraline (ZOLOFT) 100 MG tablet; Take 2 tablets (200 mg total) by mouth daily.  Dispense: 180 tablet; Refill: 1 - Work on incorporating exercise into regimen - Follow up in 6 months or sooner if needed  I discussed the assessment and treatment plan with the patient. The patient was provided an opportunity to ask questions and all were answered. The patient agreed with the plan and demonstrated an understanding of the instructions.   The patient was advised to call back or seek an in-person evaluation if the symptoms worsen or if the condition fails to improve as anticipated.  The duration of face-to-face time during this visit was greater than 15 minutes. Greater than 50% of this time was spent in counseling, explanation of diagnosis, planning of further management, and/or coordination of care including discussions of anxiety and depression, pharmaceutical and non pharmaceutical options     Dorothyann Peng, NP

## 2019-01-19 NOTE — Telephone Encounter (Signed)
Covid-19 screening questions   Do you now or have you had a fever in the last 14 days? No.  Do you have any respiratory symptoms of shortness of breath or cough now or in the last 14 days? No.  Do you have any family members or close contacts with diagnosed or suspected Covid-19 in the past 14 days? No.  Have you been tested for Covid-19 and found to be positive? No.       Follow up Call-  Call back number 01/15/2019  Post procedure Call Back phone  # [phone]  Permission to leave phone message Yes  Some recent data might be hidden     Patient questions:  Do you have a fever, pain , or abdominal swelling? No. Pain Score  0 *  Have you tolerated food without any problems? Yes.    Have you been able to return to your normal activities? Yes.    Do you have any questions about your discharge instructions: Diet   No. Medications  No. Follow up visit  No.  Do you have questions or concerns about your Care? Yes.   Talked to pt.'s wife Alyse Low.  She inquired re: when would they hear results from the procedure.  Told her she would receive a letter in 2-3 weeks with lab results, or receive a call sooner, if the results indicate need for further treatment/medication. Actions: * If pain score is 4 or above: No action needed, pain <4.

## 2019-01-22 ENCOUNTER — Other Ambulatory Visit: Payer: Self-pay | Admitting: Adult Health

## 2019-01-22 ENCOUNTER — Encounter: Payer: Self-pay | Admitting: Family Medicine

## 2019-01-22 MED FILL — ATORVASTATIN 20 MG TABLET: 20 | 90 days supply | Qty: 90 | Fill #0

## 2019-01-22 MED FILL — PANTOPRAZOLE SOD DR 40 MG T: 40 | 30 days supply | Qty: 30 | Fill #1

## 2019-01-27 ENCOUNTER — Ambulatory Visit: Payer: 59 | Admitting: Pain Medicine

## 2019-01-29 ENCOUNTER — Ambulatory Visit: Payer: 59 | Admitting: Adult Health

## 2019-01-29 MED FILL — oxyCODONE HCL 5 MG TABS: 5 | 30 days supply | Qty: 120 | Fill #0

## 2019-02-05 MED FILL — LEVOTHYROXINE 150 MCG TAB: 150 | 90 days supply | Qty: 90 | Fill #1

## 2019-02-15 ENCOUNTER — Other Ambulatory Visit: Payer: Self-pay | Admitting: Adult Health

## 2019-02-15 ENCOUNTER — Ambulatory Visit: Payer: 59 | Admitting: Pain Medicine

## 2019-02-15 DIAGNOSIS — F419 Anxiety disorder, unspecified: Secondary | ICD-10-CM

## 2019-02-16 MED FILL — busPIRone HCL 5 MG TABS: 5 | 30 days supply | Qty: 60 | Fill #0

## 2019-02-16 NOTE — Telephone Encounter (Signed)
Rx not in current med list. Please advise. Pt also cancelled follow up visit, reason shows as "moved".

## 2019-02-24 ENCOUNTER — Ambulatory Visit: Payer: 59 | Admitting: Gastroenterology

## 2019-02-24 MED FILL — PANTOPRAZOLE SOD DR 40 MG T: 40 | 30 days supply | Qty: 30 | Fill #2

## 2019-03-01 MED FILL — oxyCODONE HCL 5 MG TABS: 5 | 30 days supply | Qty: 120 | Fill #0

## 2019-03-25 ENCOUNTER — Encounter: Payer: Self-pay | Admitting: Pain Medicine

## 2019-03-25 ENCOUNTER — Telehealth: Payer: Self-pay

## 2019-03-25 NOTE — Telephone Encounter (Signed)
LM for patient to call us to go over pre virtual appointment questions.

## 2019-03-25 NOTE — Telephone Encounter (Signed)
Pt returned call for nurse to go over medication

## 2019-03-28 NOTE — Progress Notes (Addendum)
Patient: Chad Wilson  Service Category: E/M  Provider: [name], MD  DOB: [birth year]  DOS: 03/29/2019  Referring Provider: Dorothyann Peng, NP  MRN: [id number]  Setting: Ambulatory outpatient  PCP: Dorothyann Peng, NP  Type: Established Patient  Specialty: Interventional Pain Management    Location: External  Delivery: TeleHealth     Virtual Encounter - Pain Management PROVIDER NOTE: Information contained herein reflects review and annotations entered in association with encounter. Interpretation of such information and data should be left to medically-trained personnel. Information provided to patient can be located elsewhere in the medical record under "Patient Instructions". Document created using STT-dictation technology, any transcriptional errors that may result from process are unintentional.    Contact & Pharmacy Preferred: [phone] Home: [phone] (home) Mobile: [phone] (mobile) E-mail: [email or web address]  [place], [place] - [address] [facility]. [address] [place] [address] Phone: [phone] Fax: [phone]   Pre-screening  [name] offered "in-person" vs "virtual" encounter. He indicated preferring virtual for this encounter.   Reason COVID-19*  Social distancing based on CDC and AMA recommendations.   I contacted Chad Wilson on 03/29/2019 via telephone.      I clearly identified myself as [name], MD. I verified that I was speaking with the correct person using two identifiers (Name: Chad Wilson, and date of birth: 13-Feb-1976).  Consent I sought verbal advanced consent from Chad Wilson for virtual visit interactions. I informed Chad Wilson of possible security and privacy concerns, risks, and limitations associated with providing "not-in-person" medical evaluation and management services. I also informed Chad Wilson of the availability of "in-person" appointments. Finally, I informed him that there  would be a charge for the virtual visit and that he could be  personally, fully or partially, financially responsible for it. Chad Wilson expressed understanding and agreed to proceed.   Historic Elements   Chad Wilson is a 44 y.o. year old, male patient evaluated today after his last encounter by our practice on 03/25/2019. Chad Wilson  has a past medical history of Acute hepatitis C without mention of hepatic coma(070.51), Acute postoperative pain (12/10/2018), Alcohol abuse, unspecified, Anxiety and depression, Anxiety states, Essential hypertension, benign, GERD (gastroesophageal reflux disease), Graves disease, Panic disorder without agoraphobia, Thyroid disease, and Tobacco use disorder. He also  has a past surgical history that includes Multiple tooth extractions (15 yrs. ago); Anterior cervical decomp/discectomy fusion (N/A, 05/21/2017); and Wisdom tooth extraction. Chad Wilson has a current medication list which includes the following prescription(s): atorvastatin, buspirone, clonazepam, depo-testosterone, levothyroxine, lisinopril, [START ON 03/30/2019] oxycodone, [START ON 04/29/2019] oxycodone, pantoprazole, [START ON 05/11/2019] pregabalin, sertraline, sildenafil, and [DISCONTINUED] omeprazole. He  reports that he has been smoking cigarettes. He has a 30.00 pack-year smoking history. His smokeless tobacco use includes chew. He reports current alcohol use of about 42.0 standard drinks of alcohol per week. He reports previous drug use. Chad Wilson is allergic to chantix [varenicline].   HPI  Today, he is being contacted for medication management.  The patient indicates doing well with the current medication regimen. No adverse reactions or side effects reported to the medications.   Pharmacotherapy Assessment  Analgesic: Oxycodone IR 5 mg, 1 tab PO q 6 hrs (20 mg/day of oxycodone) MME/day: 30 mg/day.   Monitoring: Pharmacotherapy: No side-effects or adverse reactions reported. [place] PMP: PDMP reviewed during  this encounter.       Compliance: No problems identified. Effectiveness: Clinically acceptable. Plan: Refer to "POC".  UDS:  Summary  Date Value Ref Range Status  06/03/2018 FINAL  Final    Comment:    ==================================================================== TOXASSURE COMP DRUG ANALYSIS,UR ==================================================================== Test                             Result       Flag       Units Drug Present   Lorazepam                      219                     ng/mg creat    Source of lorazepam is a scheduled prescription medication.   Hydrocodone                    1871                    ng/mg creat   Hydromorphone                  463                     ng/mg creat   Dihydrocodeine                 252                     ng/mg creat   Norhydrocodone                 1617                    ng/mg creat    Sources of hydrocodone include scheduled prescription    medications. Hydromorphone, dihydrocodeine and norhydrocodone are    expected metabolites of hydrocodone. Hydromorphone and    dihydrocodeine are also available as scheduled prescription    medications.   Zolpidem                       PRESENT   Zolpidem Acid                  PRESENT    Zolpidem acid is an expected metabolite of zolpidem.   Sertraline                     PRESENT   Desmethylsertraline            PRESENT    Desmethylsertraline is an expected metabolite of sertraline.   Acetaminophen                  PRESENT ==================================================================== Test                      Result    Flag   Units      Ref Range   Creatinine              52               mg/dL      >=20 ==================================================================== Declared Medications:  Medication list was not provided. ==================================================================== For clinical consultation, please call [phone]. ====================================================================    Laboratory Chemistry Profile (12 mo)  Renal: 06/03/2018: BUN/Creatinine Ratio 8 10/05/2018: BUN 17; Creatinine, Ser 1.07  Lab Results  Component Value Date   GFR 74.96 02/17/2018   GFRAA >60 10/05/2018  GFRNONAA >60 10/05/2018   Hepatic: 10/05/2018: Albumin 4.0 Lab Results  Component Value Date   AST 22 10/05/2018   ALT 56 (H) 10/05/2018   Other: 06/03/2018: CRP 1; Sed Rate 21 Note: Above Lab results reviewed.  Imaging  DG PAIN CLINIC C-ARM 1-60 MIN NO REPORT Fluoro was used, but no Radiologist interpretation will be provided.  Please refer to "NOTES" tab for provider progress note.   Assessment  The primary encounter diagnosis was Chronic pain syndrome. Diagnoses of Chronic low back pain (Primary area of Pain) (Bilateral) (R=L) w/o sciatica, Lumbar facet syndrome (Bilateral), and Neurogenic pain were also pertinent to this visit.  Plan of Care  Problem-specific:  No problem-specific Assessment & Plan notes found for this encounter.  I am having Chad Wilson start on oxyCODONE. I am also having him maintain his Depo-Testosterone, levothyroxine, sildenafil, lisinopril, pantoprazole, clonazePAM, sertraline, atorvastatin, busPIRone, oxyCODONE, and pregabalin.  Pharmacotherapy (Medications Ordered): Meds ordered this encounter  Medications  . oxyCODONE (OXY IR/ROXICODONE) 5 MG immediate release tablet    Sig: Take 1 tablet (5 mg total) by mouth every 6 (six) hours as needed for severe pain. Must last 30 days    Dispense:  120 tablet    Refill:  0    Chronic Pain: STOP Act (Not applicable) Fill 1 day early if closed on refill date. Do not fill until: 03/30/2019. To last until: 04/29/2019. Avoid benzodiazepines within 8 hours of opioids  . oxyCODONE (OXY IR/ROXICODONE) 5 MG immediate release tablet    Sig: Take 1 tablet (5 mg total) by mouth every 6 (six) hours as needed for severe pain. Must last 30  days    Dispense:  120 tablet    Refill:  0    Chronic Pain: STOP Act (Not applicable) Fill 1 day early if closed on refill date. Do not fill until: 04/29/2019. To last until: 05/29/2019. Avoid benzodiazepines within 8 hours of opioids  . pregabalin (LYRICA) 150 MG capsule    Sig: Take 1 capsule (150 mg total) by mouth 3 (three) times daily.    Dispense:  90 capsule    Refill:  5    Fill one day early if pharmacy is closed on scheduled refill date. May substitute for generic if available.   Orders:  Orders Placed This Encounter  Procedures  . L-FCT Blk (PRN)    Scheduling timeframe: (PRN procedure) Chad Wilson will call when needed. Clinical indication: Axial low back pain. Lumbosacral Spondylosis (M47.897).  Sedation: Usually done with sedation. (May be done without sedation if so desired by patient.) Requirements: NPO x 8 hrs.; Driver; Stop blood thinners. Interval: No sooner than two weeks for diagnostic or therapeutic. No sooner than every other month for palliative.    Standing Status:   Standing    Number of Occurrences:   5    Standing Expiration Date:   03/28/2020    Scheduling Instructions:     Procedure: Lumbar facet block (AKA.: Lumbosacral medial branch nerve block)     Level: L3-4, L4-5, & L5-S1 Facets (L2, L3, L4, L5, & S1 Medial Branch Nerves)     Laterality: Bilateral    Order Specific Question:   Where will this procedure be performed?    Answer:   [facility]   Follow-up plan:   Return in about 8 weeks (around 05/26/2019) for (VV), (MM), PRN Procedure(s): (B) L-FCT BLK #3, (w/ Sedation).      Considering:   Therapeutic bilateral lumbar facet RFA  (we will  start on the left side) (scheduled for 12/10/2018)   PRN Procedures:   Palliative/therapeutic bilateral lumbar facet block #3  Palliative right-sided lumbar facet RFA #2 (last done 12/10/2018) (100/100/100/100) Palliative left-sided lumbar facet RFA #2 (last done 01/05/2019) (100/100/50/90-100)     Recent  Visits Date Type Provider Dept  01/11/19 Office Visit Milinda Pointer, MD [facility]  01/05/19 Procedure visit Milinda Pointer, MD [facility]  Showing recent visits within past 90 days and meeting all other requirements   Today's Visits Date Type Provider Dept  03/29/19 Telemedicine Milinda Pointer, MD [facility]  Showing today's visits and meeting all other requirements   Future Appointments No visits were found meeting these conditions.  Showing future appointments within next 90 days and meeting all other requirements   I discussed the assessment and treatment plan with the patient. The patient was provided an opportunity to ask questions and all were answered. The patient agreed with the plan and demonstrated an understanding of the instructions.  Patient advised to call back or seek an in-person evaluation if the symptoms or condition worsens.  Total duration of non-face-to-face encounter: 13 minutes.  Note by: [name], MD Date: 03/29/2019; Time: 8:54 AM

## 2019-03-29 ENCOUNTER — Ambulatory Visit: Payer: 59 | Attending: Pain Medicine | Admitting: Pain Medicine

## 2019-03-29 ENCOUNTER — Other Ambulatory Visit: Payer: Self-pay

## 2019-03-29 DIAGNOSIS — G894 Chronic pain syndrome: Secondary | ICD-10-CM

## 2019-03-29 DIAGNOSIS — M545 Low back pain, unspecified: Secondary | ICD-10-CM

## 2019-03-29 DIAGNOSIS — M47816 Spondylosis without myelopathy or radiculopathy, lumbar region: Secondary | ICD-10-CM

## 2019-03-29 DIAGNOSIS — M792 Neuralgia and neuritis, unspecified: Secondary | ICD-10-CM

## 2019-03-29 DIAGNOSIS — G8929 Other chronic pain: Secondary | ICD-10-CM

## 2019-03-29 MED ORDER — OXYCODONE HCL 5 MG PO TABS: 5.0000 mg | ORAL_TABLET | Freq: Four times a day (QID) | ORAL | 0 refills | Status: DC | PRN

## 2019-03-29 MED ORDER — PREGABALIN 150 MG PO CAPS: 150.0000 mg | ORAL_CAPSULE | Freq: Three times a day (TID) | ORAL | 5 refills | Status: DC

## 2019-03-29 MED FILL — PREGABALIN 150 MG CAPS: 150 | 30 days supply | Qty: 90 | Fill #0

## 2019-03-30 MED FILL — PANTOPRAZOLE SOD DR 40 MG T: 40 | 30 days supply | Qty: 30 | Fill #3

## 2019-03-31 MED FILL — oxyCODONE HCL 5 MG TABS: 5 | 30 days supply | Qty: 120 | Fill #0

## 2019-04-05 ENCOUNTER — Encounter: Payer: Self-pay | Admitting: Adult Health

## 2019-04-12 MED FILL — busPIRone HCL 5 MG TABS: 5 | 30 days supply | Qty: 60 | Fill #1

## 2019-04-16 ENCOUNTER — Ambulatory Visit (INDEPENDENT_AMBULATORY_CARE_PROVIDER_SITE_OTHER): Payer: 59

## 2019-04-16 ENCOUNTER — Ambulatory Visit (INDEPENDENT_AMBULATORY_CARE_PROVIDER_SITE_OTHER): Payer: 59 | Admitting: Orthopaedic Surgery

## 2019-04-16 ENCOUNTER — Other Ambulatory Visit: Payer: Self-pay

## 2019-04-16 ENCOUNTER — Encounter: Payer: Self-pay | Admitting: Orthopaedic Surgery

## 2019-04-16 VITALS — Ht 67.0 in | Wt 200.0 lb

## 2019-04-16 DIAGNOSIS — M7042 Prepatellar bursitis, left knee: Secondary | ICD-10-CM | POA: Diagnosis not present

## 2019-04-16 MED ORDER — BUPIVACAINE HCL 0.25 % IJ SOLN
2.0000 mL | INTRAMUSCULAR | Status: AC | PRN
  Administered 2019-04-16: 2 mL via INTRA_ARTICULAR

## 2019-04-16 MED ORDER — LIDOCAINE HCL 1 % IJ SOLN
2.0000 mL | INTRAMUSCULAR | Status: AC | PRN
  Administered 2019-04-16: 2 mL

## 2019-04-16 MED ORDER — METHYLPREDNISOLONE ACETATE 40 MG/ML IJ SUSP
40.0000 mg | INTRAMUSCULAR | Status: AC | PRN
  Administered 2019-04-16: 40 mg via INTRA_ARTICULAR

## 2019-04-16 NOTE — Progress Notes (Signed)
Office Visit Note   Patient: Chad Wilson           Date of Birth: [birth year]           MRN: [id number] Visit Date: 04/16/2019              Requested by: Dorothyann Peng, NP [place],   [id number] PCP: Dorothyann Peng, NP   Assessment & Plan: Visit Diagnoses:  1. Prepatellar bursitis of left knee     Plan: Impression is left knee prepatellar bursitis.  Today, I aspirated 15 cc of serous fluid off of the prepatellar bursa.  I then injected this with cortisone.  Compression wrap applied.  The patient will not do any kneeling for the next couple weeks.  He will follow-up with [place] as needed.  Call with concerns or questions.  Follow-Up Instructions: Return if symptoms worsen or fail to improve.   Orders:  Orders Placed This Encounter  Procedures  . Large Joint Inj: L knee  . XR KNEE 3 VIEW LEFT   No orders of the defined types were placed in this encounter.     Procedures: pre-patellar bursa Large Joint Inj: L knee on 04/16/2019 10:02 AM Indications: pain Details: 22 G needle, anterolateral approach Medications: 2 mL lidocaine 1 %; 2 mL bupivacaine 0.25 %; 40 mg methylPREDNISolone acetate 40 MG/ML      Clinical Data: No additional findings.   Subjective: Chief Complaint  Patient presents with  . Left Knee - Pain    HPI patient is a pleasant 44 year old gentleman who works in [employer] who presents our clinic today with anterior left knee pain.  This has been ongoing for the past week.  No specific injury but he notes he has been working 7 days a week lately.  The pain he has is primarily to the anterior aspect.  No mechanical symptoms.  Pain is aggravated with kneeling.  No numbness, tingling or burning.  He has noticed occasional swelling and warmth.  No fevers or chills.  No other systemic symptoms.  No previous left knee pain.  Review of Systems as detailed in HPI.  All others reviewed and are negative.   Objective: Vital Signs: Ht 5' 7"  (1.702  m)   Wt 200 lb (90.7 kg)   BMI 31.32 kg/m   Physical Exam well-developed well-nourished gentleman in no acute distress.  Alert oriented x3.  Ortho Exam examination of his left knee reveals slight swelling to the prepatellar bursa.  He has mild medial lateral joint line tenderness.  Mild warmth.  Range of motion of the left knee from 0 to 125 degrees.  Stable vascular status.  Is neurovascular intact distally.  Specialty Comments:  No specialty comments available.  Imaging: XR KNEE 3 VIEW LEFT  Result Date: 04/16/2019 X-rays demonstrate medial compartment chondrocalcinosis.  No other acute findings    PMFS History: Patient Active Problem List   Diagnosis Date Noted  . Neurogenic pain 10/21/2018  . Preoperative testing 08/17/2018  . Grade 1 Lumbosacral Retrolisthesis of L5/S1 06/29/2018  . Lumbar facet hypertrophy (Bilateral) 06/29/2018  . Osteoarthritis of facet joint of lumbar spine 06/29/2018  . 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  . Lumbar spondylosis 06/29/2018  . DDD (degenerative disc disease), cervical 06/29/2018  . Cervicalgia 06/29/2018  . Abnormal MRI, lumbar spine 06/29/2018  . Long term prescription benzodiazepine use 06/29/2018  . Vitamin B12 deficiency 06/28/2018  . Vitamin D insufficiency 06/28/2018  . Spondylosis without myelopathy or radiculopathy, lumbosacral region  06/28/2018  . Chronic pain syndrome 06/03/2018  . Pharmacologic therapy 06/03/2018  . Long term current use of opiate analgesic 06/03/2018  . Disorder of skeletal system 06/03/2018  . Problems influencing health status 06/03/2018  . Chronic low back pain (Primary area of Pain) (Bilateral) (R=L) w/o sciatica 06/03/2018  . DDD (degenerative disc disease), lumbosacral 04/06/2018  . Lumbar facet syndrome (Bilateral) 04/06/2018  . 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 History of cervical spinal surgery 05/21/2017  . Hypothyroidism 04/14/2017  .  Chronic neck pain with history of cervical spinal surgery 09/06/2011  . Cervical radiculopathy at C6 09/06/2011  . JAUNDICE 05/21/2010  . HEPATITIS C 11/21/2009  . Anxiety and depression 11/21/2009  . PANIC ATTACK 11/21/2009  . Alcohol abuse 11/21/2009  . Tobacco abuse 11/21/2009  . HYPERTENSION, BENIGN ESSENTIAL 11/21/2009  . FATIGUE 11/21/2009   Past Medical History:  Diagnosis Date  . Acute hepatitis C without mention of hepatic coma(070.51)    has been cleared   . Acute postoperative pain 12/10/2018  . Alcohol abuse, unspecified   . Anxiety and depression   . Anxiety states   . Essential hypertension, benign   . GERD (gastroesophageal reflux disease)   . Graves disease    treated by Dr Loanne Drilling  . Panic disorder without agoraphobia   . Thyroid disease    Graves Disease  . Tobacco use disorder     Family History  Problem Relation Age of Onset  . Anxiety disorder Father   . Heart disease Father   . Hypertension Father   . Thyroid disease Mother   . Anxiety disorder Sister   . Hyperthyroidism Sister   . Colon cancer Neg Hx   . Esophageal cancer Neg Hx   . Pancreatic cancer Neg Hx     Past Surgical History:  Procedure Laterality Date  . ANTERIOR CERVICAL DECOMP/DISCECTOMY FUSION N/A 05/21/2017   Procedure: Anterior Cervical Decompression Fusion Cervical five-six, Cervical six-seven;  Surgeon: Eustace Moore, MD;  Location: [place];  Service: Neurosurgery;  Laterality: N/A;  . MULTIPLE TOOTH EXTRACTIONS  15 yrs. ago  . WISDOM TOOTH EXTRACTION     Social History   Occupational History    Employer: [employer]  Tobacco Use  . Smoking status: Current Every Day Smoker    Packs/day: 1.00    Years: 30.00    Pack years: 30.00    Types: Cigarettes  . Smokeless tobacco: Current User    Types: Chew  Substance and Sexual Activity  . Alcohol use: Yes    Alcohol/week: 42.0 standard drinks    Types: 42 Cans of beer per week    Comment: 6 pack of beer daily  .  Drug use: Not Currently    Comment: hx of cocaine  last dose 7 yrs ago  . Sexual activity: Not on file

## 2019-04-19 MED FILL — TESTOSTERONE CYP 200 MG/ML: 200 | 56 days supply | Qty: 4 | Fill #0

## 2019-04-23 ENCOUNTER — Other Ambulatory Visit: Payer: Self-pay | Admitting: Adult Health

## 2019-04-23 DIAGNOSIS — I10 Essential (primary) hypertension: Secondary | ICD-10-CM

## 2019-04-23 MED FILL — ATORVASTATIN 20 MG TABLET: 20 | 90 days supply | Qty: 90 | Fill #0

## 2019-04-23 MED FILL — SERTRALINE HCL 100 MG TAB: 100 | 90 days supply | Qty: 180 | Fill #1

## 2019-04-23 MED FILL — LISINOPRIL 40 MG TABLET: 40 | 90 days supply | Qty: 90 | Fill #0

## 2019-04-28 ENCOUNTER — Other Ambulatory Visit: Payer: Self-pay | Admitting: Adult Health

## 2019-04-28 DIAGNOSIS — F419 Anxiety disorder, unspecified: Secondary | ICD-10-CM

## 2019-04-28 MED FILL — clonazePAM 0.5 MG TABS: 0.5 | 15 days supply | Qty: 30 | Fill #0

## 2019-04-28 NOTE — Telephone Encounter (Signed)
Okay for refill?  

## 2019-04-30 MED FILL — oxyCODONE HCL 5 MG TABS: 5 | 30 days supply | Qty: 120 | Fill #0

## 2019-05-03 ENCOUNTER — Encounter: Payer: Self-pay | Admitting: Family Medicine

## 2019-05-03 ENCOUNTER — Ambulatory Visit (INDEPENDENT_AMBULATORY_CARE_PROVIDER_SITE_OTHER): Payer: 59 | Admitting: Family Medicine

## 2019-05-03 ENCOUNTER — Other Ambulatory Visit: Payer: Self-pay

## 2019-05-03 DIAGNOSIS — M7042 Prepatellar bursitis, left knee: Secondary | ICD-10-CM

## 2019-05-03 MED FILL — PANTOPRAZOLE SOD DR 40 MG T: 40 | 30 days supply | Qty: 30 | Fill #4

## 2019-05-03 NOTE — Progress Notes (Signed)
Office Visit Note   Patient: Chad Wilson           Date of Birth: Jul 19, 1975           MRN: [id number] Visit Date: 05/03/2019 Requested by: Dorothyann Peng, NP [place],  [place] [address] PCP: Dorothyann Peng, NP  Subjective: Chief Complaint  Patient presents with  . Left Knee - Pain    Knee is swollen again since yesterday (had knee aspirated on 04/16/19). Pain anterior knee.    HPI: He is here with recurrent left knee swelling.  About 2 weeks ago he had his prepatellar bursa aspirated and injected.  Pain went away until couple days ago.  Has been swollen, warm and red since then.  No fevers or chills.  No history of gout.  Since last visit he has also purchased new kneepads.               ROS:   All other systems were reviewed and are negative.  Objective: Vital Signs: There were no vitals taken for this visit.  Physical Exam:  General:  Alert and oriented, in no acute distress. Pulm:  Breathing unlabored. Psy:  Normal mood, congruent affect. Skin: Erythema over the prepatellar bursa. Left knee: No intra-articular effusion.  He does have 2+ swelling of the prepatellar bursa and it is mildly tender to palpation and warm to the touch.  Imaging: None today  Assessment & Plan: 1.  Recurrent left knee prepatellar bursitis -Discussed options with him and elected to aspirate again, send the fluid for culture and crystal analysis, and inject with cortisone.     Procedures: Left knee aspiration and injection: After sterile prep with Betadine, injected 3 cc 1% lidocaine without epinephrine, then aspirated 12 cc of blood-tinged synovial fluid, then injected 40 mg methylprednisolone into the prepatellar bursa from a medial approach.   PMFS History: Patient Active Problem List   Diagnosis Date Noted  . Neurogenic pain 10/21/2018  . Preoperative testing 08/17/2018  . Grade 1 Lumbosacral Retrolisthesis of L5/S1 06/29/2018  . Lumbar facet hypertrophy (Bilateral)  06/29/2018  . Osteoarthritis of facet joint of lumbar spine 06/29/2018  . 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  . Lumbar spondylosis 06/29/2018  . DDD (degenerative disc disease), cervical 06/29/2018  . Cervicalgia 06/29/2018  . Abnormal MRI, lumbar spine 06/29/2018  . Long term prescription benzodiazepine use 06/29/2018  . Vitamin B12 deficiency 06/28/2018  . Vitamin D insufficiency 06/28/2018  . Spondylosis without myelopathy or radiculopathy, lumbosacral region 06/28/2018  . Chronic pain syndrome 06/03/2018  . Pharmacologic therapy 06/03/2018  . Long term current use of opiate analgesic 06/03/2018  . Disorder of skeletal system 06/03/2018  . Problems influencing health status 06/03/2018  . Chronic low back pain (Primary area of Pain) (Bilateral) (R=L) w/o sciatica 06/03/2018  . DDD (degenerative disc disease), lumbosacral 04/06/2018  . Lumbar facet syndrome (Bilateral) 04/06/2018  . 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 History of cervical spinal surgery 05/21/2017  . Hypothyroidism 04/14/2017  . Chronic neck pain with history of cervical spinal surgery 09/06/2011  . Cervical radiculopathy at C6 09/06/2011  . JAUNDICE 05/21/2010  . HEPATITIS C 11/21/2009  . Anxiety and depression 11/21/2009  . PANIC ATTACK 11/21/2009  . Alcohol abuse 11/21/2009  . Tobacco abuse 11/21/2009  . HYPERTENSION, BENIGN ESSENTIAL 11/21/2009  . FATIGUE 11/21/2009   Past Medical History:  Diagnosis Date  . Acute hepatitis C without mention of hepatic coma(070.51)  has been cleared   . Acute postoperative pain 12/10/2018  . Alcohol abuse, unspecified   . Anxiety and depression   . Anxiety states   . Essential hypertension, benign   . GERD (gastroesophageal reflux disease)   . Graves disease    treated by Dr Loanne Drilling  . Panic disorder without agoraphobia   . Thyroid disease    Graves Disease  . Tobacco use disorder     Family History  Problem  Relation Age of Onset  . Anxiety disorder Father   . Heart disease Father   . Hypertension Father   . Thyroid disease Mother   . Anxiety disorder Sister   . Hyperthyroidism Sister   . Colon cancer Neg Hx   . Esophageal cancer Neg Hx   . Pancreatic cancer Neg Hx     Past Surgical History:  Procedure Laterality Date  . ANTERIOR CERVICAL DECOMP/DISCECTOMY FUSION N/A 05/21/2017   Procedure: Anterior Cervical Decompression Fusion Cervical five-six, Cervical six-seven;  Surgeon: Eustace Moore, MD;  Location: [place];  Service: Neurosurgery;  Laterality: N/A;  . MULTIPLE TOOTH EXTRACTIONS  15 yrs. ago  . WISDOM TOOTH EXTRACTION     Social History   Occupational History    Employer: [employer]  Tobacco Use  . Smoking status: Current Every Day Smoker    Packs/day: 1.00    Years: 30.00    Pack years: 30.00    Types: Cigarettes  . Smokeless tobacco: Current User    Types: Chew  Substance and Sexual Activity  . Alcohol use: Yes    Alcohol/week: 42.0 standard drinks    Types: 42 Cans of beer per week    Comment: 6 pack of beer daily  . Drug use: Not Currently    Comment: hx of cocaine  last dose 7 yrs ago  . Sexual activity: Not on file

## 2019-05-09 LAB — ANAEROBIC AND AEROBIC CULTURE
AER RESULT:: NO GROWTH
GRAM STAIN:: NONE SEEN
MICRO NUMBER:: 10152062
MICRO NUMBER:: 10152063
SPECIMEN QUALITY:: ADEQUATE
SPECIMEN QUALITY:: ADEQUATE

## 2019-05-09 LAB — SYNOVIAL CELL COUNT + DIFF, W/ CRYSTALS
Basophils, %: 0 %
Eosinophils-Synovial: 0 % (ref 0–2)
Lymphocytes-Synovial Fld: 21 % (ref 0–74)
Monocyte/Macrophage: 11 % (ref 0–69)
Neutrophil, Synovial: 68 % — ABNORMAL HIGH (ref 0–24)
Synoviocytes, %: 0 % (ref 0–15)
WBC, Synovial: 3232 cells/uL — ABNORMAL HIGH (ref <150)

## 2019-05-10 ENCOUNTER — Telehealth: Payer: Self-pay | Admitting: Family Medicine

## 2019-05-10 NOTE — Telephone Encounter (Signed)
Knee fluid did not show any crystals such as gout, and did not show any sign of infection.

## 2019-05-12 MED FILL — LEVOTHYROXINE SODIUM 150 MC: 150 | 90 days supply | Qty: 90 | Fill #2

## 2019-05-12 MED FILL — SILDENAFIL 20 MG TABLET: 20 | 90 days supply | Qty: 90 | Fill #0

## 2019-05-20 ENCOUNTER — Encounter: Payer: Self-pay | Admitting: Pain Medicine

## 2019-05-20 NOTE — Progress Notes (Signed)
Patient: Chad Wilson  Service Category: E/M  Provider: [name], MD  DOB: 07/12/1975  DOS: 05/24/2019  Location: Office  MRN: [id number]  Setting: Ambulatory outpatient  Referring Provider: Dorothyann Peng, NP  Type: Established Patient  Specialty: Interventional Pain Management  PCP: Dorothyann Peng, NP  Location: Remote location  Delivery: TeleHealth     Virtual Encounter - Pain Management PROVIDER NOTE: Information contained herein reflects review and annotations entered in association with encounter. Interpretation of such information and data should be left to medically-trained personnel. Information provided to patient can be located elsewhere in the medical record under "Patient Instructions". Document created using STT-dictation technology, any transcriptional errors that may result from process are unintentional.    Contact & Pharmacy Preferred: [phone] Home: [phone] (home) Mobile: [phone] (mobile) E-mail: [email or web address]  [place], [place]. [address] [place] [address] Phone: [phone] Fax: [phone]   Pre-screening  Chad Wilson offered "in-person" vs "virtual" encounter. He indicated preferring virtual for this encounter.   Reason COVID-19*  Social distancing based on CDC and AMA recommendations.   I contacted Chad Wilson on 05/24/2019 via telephone.      I clearly identified myself as [name], MD. I verified that I was speaking with the correct person using two identifiers (Name: Chad Wilson, and date of birth: 06-Sep-1975).  Consent I sought verbal advanced consent from Chad Wilson for virtual visit interactions. I informed Chad Wilson of possible security and privacy concerns, risks, and limitations associated with providing "not-in-person" medical evaluation and management services. I also informed Chad Wilson of the availability of "in-person" appointments. Finally, I  informed him that there would be a charge for the virtual visit and that he could be  personally, fully or partially, financially responsible for it. Chad Wilson expressed understanding and agreed to proceed.   Historic Elements   Chad Wilson is a 44 y.o. year old, male patient evaluated today after his last contact with our practice on 03/25/2019. Chad Wilson  has a past medical history of Acute hepatitis C without mention of hepatic coma(070.51), Acute postoperative pain (12/10/2018), Alcohol abuse, unspecified, Anxiety and depression, Anxiety states, Essential hypertension, benign, GERD (gastroesophageal reflux disease), Graves disease, Panic disorder without agoraphobia, Thyroid disease, and Tobacco use disorder. He also  has a past surgical history that includes Multiple tooth extractions (15 yrs. ago); Anterior cervical decomp/discectomy fusion (N/A, 05/21/2017); and Wisdom tooth extraction. Chad Wilson has a current medication list which includes the following prescription(s): atorvastatin, buspirone, clonazepam, depo-testosterone, levothyroxine, lisinopril, [START ON 05/29/2019] oxycodone, [START ON 06/28/2019] oxycodone, [START ON 07/28/2019] oxycodone, pantoprazole, pregabalin, sildenafil, sertraline, and [DISCONTINUED] omeprazole. He  reports that he has been smoking cigarettes. He has a 30.00 pack-year smoking history. His smokeless tobacco use includes chew. He reports current alcohol use of about 42.0 standard drinks of alcohol per week. He reports previous drug use. Chad Wilson is allergic to chantix [varenicline].   HPI  Today, he is being contacted for medication management. The patient indicates doing well with the current medication regimen. No adverse reactions or side effects reported to the medications.  The patient indicates that lately he has been having more burning sensation in the lower back.  I explained to him that this was probably neurogenic in origin.  I also explained to him that this is the  reason why we were prescribing Lyrica for him.  I took the opportunity to ask him if he was taking  the medication as instructed and he confessed that he has been occasionally missing the midday pill.  It would seem that it is more convenient for him to take it twice a day and therefore today we will go ahead and change to 150 mg 3 times daily regimen to a 225 mg twice daily regimen.  If he continues with the pain despite this change in regimen, we will probably bring him back for some lumbar facet blocks.  He had a radiofrequency ablation on the left side on 12/10/2018 and on the right side on 01/05/2019.  It has been approximately 5 months since we completed the second side.  Pharmacotherapy Assessment  Analgesic: Oxycodone IR 5 mg, 1 tab PO q 6 hrs (20 mg/day of oxycodone) MME/day: 30 mg/day.   Monitoring: [place] PMP: PDMP reviewed during this encounter.       Pharmacotherapy: No side-effects or adverse reactions reported. Compliance: No problems identified. Effectiveness: Clinically acceptable. Plan: Refer to "POC".  UDS:  Summary  Date Value Ref Range Status  06/03/2018 FINAL  Final    Comment:    ==================================================================== TOXASSURE COMP DRUG ANALYSIS,UR ==================================================================== Test                             Result       Flag       Units Drug Present   Lorazepam                      219                     ng/mg creat    Source of lorazepam is a scheduled prescription medication.   Hydrocodone                    1871                    ng/mg creat   Hydromorphone                  463                     ng/mg creat   Dihydrocodeine                 252                     ng/mg creat   Norhydrocodone                 1617                    ng/mg creat    Sources of hydrocodone include scheduled prescription    medications. Hydromorphone, dihydrocodeine and norhydrocodone are    expected metabolites  of hydrocodone. Hydromorphone and    dihydrocodeine are also available as scheduled prescription    medications.   Zolpidem                       PRESENT   Zolpidem Acid                  PRESENT    Zolpidem acid is an expected metabolite of zolpidem.   Sertraline                     PRESENT   Desmethylsertraline  PRESENT    Desmethylsertraline is an expected metabolite of sertraline.   Acetaminophen                  PRESENT ==================================================================== Test                      Result    Flag   Units      Ref Range   Creatinine              52               mg/dL      >=20 ==================================================================== Declared Medications:  Medication list was not provided. ==================================================================== For clinical consultation, please call [phone]. ====================================================================    Laboratory Chemistry Profile   Renal Lab Results  Component Value Date   BUN 17 10/05/2018   CREATININE 1.07 10/05/2018   BCR 8 (L) 06/03/2018   GFR 74.96 02/17/2018   GFRAA >60 10/05/2018   GFRNONAA >60 10/05/2018    Hepatic Lab Results  Component Value Date   AST 22 10/05/2018   ALT 56 (H) 10/05/2018   ALBUMIN 4.0 10/05/2018   ALKPHOS 87 10/05/2018   HCVAB (A) 09/29/2009    Reactive (NOTE) Result repeated and verified.                                                                       This test is for screening purposes only.  Reactive results should be confirmed by an alternative method.  Suggest HCV Qualitative, PCR, test code  83130.  Specimens will be stable for reflex testing up to 3 days after collection.   LIPASE 26 10/05/2018    Electrolytes Lab Results  Component Value Date   NA 141 10/05/2018   K 3.2 (L) 10/05/2018   CL 101 10/05/2018   CALCIUM 8.8 (L) 10/05/2018   MG 2.1 06/03/2018    Bone Lab Results  Component  Value Date   VD25OH 21.01 (L) 02/17/2018   TESTOFREE See below 02/17/2018   TESTOSTERONE 147 (L) 02/17/2018    Inflammation (CRP: Acute Phase) (ESR: Chronic Phase) Lab Results  Component Value Date   CRP 1 06/03/2018   ESRSEDRATE 21 (H) 06/03/2018      Note: Above Lab results reviewed.  Imaging  XR KNEE 3 VIEW LEFT X-rays demonstrate medial compartment chondrocalcinosis.  No other acute  findings  Assessment  Diagnoses of Chronic pain syndrome and Neurogenic pain were pertinent to this visit.  Plan of Care  Problem-specific:  No problem-specific Assessment & Plan notes found for this encounter.  [name] has a current medication list which includes the following long-term medication(s): atorvastatin, clonazepam, depo-testosterone, levothyroxine, lisinopril, [START ON 05/29/2019] oxycodone, [START ON 06/28/2019] oxycodone, [START ON 07/28/2019] oxycodone, pantoprazole, pregabalin, sertraline, and [DISCONTINUED] omeprazole.  Pharmacotherapy (Medications Ordered): Meds ordered this encounter  Medications  . oxyCODONE (OXY IR/ROXICODONE) 5 MG immediate release tablet    Sig: Take 1 tablet (5 mg total) by mouth every 6 (six) hours as needed for severe pain. Must last 30 days    Dispense:  120 tablet    Refill:  0    Chronic Pain: STOP Act (Not applicable) Fill 1 day early if closed on refill  date. Do not fill until: 05/29/2019. To last until: 06/28/2019. Avoid benzodiazepines within 8 hours of opioids  . oxyCODONE (OXY IR/ROXICODONE) 5 MG immediate release tablet    Sig: Take 1 tablet (5 mg total) by mouth every 6 (six) hours as needed for severe pain. Must last 30 days    Dispense:  120 tablet    Refill:  0    Chronic Pain: STOP Act (Not applicable) Fill 1 day early if closed on refill date. Do not fill until: 06/28/2019. To last until: 07/28/2019. Avoid benzodiazepines within 8 hours of opioids  . oxyCODONE (OXY IR/ROXICODONE) 5 MG immediate release tablet    Sig: Take 1 tablet (5  mg total) by mouth every 6 (six) hours as needed for severe pain. Must last 30 days    Dispense:  120 tablet    Refill:  0    Chronic Pain: STOP Act (Not applicable) Fill 1 day early if closed on refill date. Do not fill until: 07/28/2019. To last until: 08/27/2019. Avoid benzodiazepines within 8 hours of opioids  . pregabalin (LYRICA) 225 MG capsule    Sig: Take 1 capsule (225 mg total) by mouth 2 (two) times daily.    Dispense:  60 capsule    Refill:  5    Fill one day early if pharmacy is closed on scheduled refill date. May substitute for generic if available.   Orders:  No orders of the defined types were placed in this encounter.  Follow-up plan:   Return in about 3 months (around 08/25/2019) for (VV), (MM).      Considering:   Therapeutic bilateral lumbar facet RFA  (we will start on the left side) (scheduled for 12/10/2018)   PRN Procedures:   Palliative/therapeutic bilateral lumbar facet block #3  Palliative right-sided lumbar facet RFA #2 (last done 12/10/2018) (100/100/100/100) Palliative left-sided lumbar facet RFA #2 (last done 01/05/2019) (100/100/50/90-100)      Recent Visits Date Type Provider Dept  03/29/19 Telemedicine Milinda Pointer, MD [facility]  Showing recent visits within past 90 days and meeting all other requirements   Today's Visits Date Type Provider Dept  05/24/19 Telemedicine Milinda Pointer, MD [facility]  Showing today's visits and meeting all other requirements   Future Appointments No visits were found meeting these conditions.  Showing future appointments within next 90 days and meeting all other requirements   I discussed the assessment and treatment plan with the patient. The patient was provided an opportunity to ask questions and all were answered. The patient agreed with the plan and demonstrated an understanding of the instructions.  Patient advised to call back or seek an in-person evaluation if the symptoms or  condition worsens.  Duration of encounter: 15 minutes.  Note by: [name], MD Date: 05/24/2019; Time: 10:24 AM

## 2019-05-24 ENCOUNTER — Other Ambulatory Visit: Payer: Self-pay

## 2019-05-24 ENCOUNTER — Ambulatory Visit: Payer: 59 | Attending: Pain Medicine | Admitting: Pain Medicine

## 2019-05-24 ENCOUNTER — Telehealth: Payer: Self-pay

## 2019-05-24 DIAGNOSIS — G894 Chronic pain syndrome: Secondary | ICD-10-CM | POA: Diagnosis not present

## 2019-05-24 DIAGNOSIS — M792 Neuralgia and neuritis, unspecified: Secondary | ICD-10-CM

## 2019-05-24 MED ORDER — OXYCODONE HCL 5 MG PO TABS: 5.0000 mg | ORAL_TABLET | Freq: Four times a day (QID) | ORAL | 0 refills | Status: DC | PRN

## 2019-05-24 MED ORDER — PREGABALIN 225 MG PO CAPS: 225.0000 mg | ORAL_CAPSULE | Freq: Two times a day (BID) | ORAL | 5 refills | Status: DC

## 2019-05-24 MED FILL — PREGABALIN 225 MG CAPS: 225 | 30 days supply | Qty: 60 | Fill #0

## 2019-05-24 NOTE — Telephone Encounter (Signed)
Lyrica 150 mg discontinued at [facility] outpatient pharmacy as ordered by Dr Dossie Arbour.

## 2019-05-27 ENCOUNTER — Other Ambulatory Visit: Payer: Self-pay | Admitting: Adult Health

## 2019-05-27 DIAGNOSIS — F419 Anxiety disorder, unspecified: Secondary | ICD-10-CM

## 2019-05-27 MED FILL — busPIRone HCL 5 MG TABS: 5 | 30 days supply | Qty: 60 | Fill #0

## 2019-05-27 NOTE — Telephone Encounter (Signed)
Sent to the pharmacy by e-scribe.  Pt should come back in May for follow up.

## 2019-05-28 MED FILL — oxyCODONE HCL 5 MG TABS: 5 | 30 days supply | Qty: 120 | Fill #0

## 2019-06-04 MED FILL — PANTOPRAZOLE SOD DR 40 MG T: 40 | 30 days supply | Qty: 30 | Fill #5

## 2019-06-15 NOTE — Telephone Encounter (Signed)
disregard

## 2019-06-18 MED FILL — TESTOSTERONE CYP 200 MG/ML: 200 | 56 days supply | Qty: 4 | Fill #1

## 2019-06-21 ENCOUNTER — Encounter: Payer: Self-pay | Admitting: Family Medicine

## 2019-06-21 ENCOUNTER — Other Ambulatory Visit: Payer: Self-pay | Admitting: Family Medicine

## 2019-06-21 ENCOUNTER — Ambulatory Visit (INDEPENDENT_AMBULATORY_CARE_PROVIDER_SITE_OTHER): Payer: 59 | Admitting: Family Medicine

## 2019-06-21 ENCOUNTER — Other Ambulatory Visit: Payer: Self-pay

## 2019-06-21 ENCOUNTER — Telehealth: Payer: Self-pay | Admitting: Radiology

## 2019-06-21 DIAGNOSIS — M7042 Prepatellar bursitis, left knee: Secondary | ICD-10-CM | POA: Diagnosis not present

## 2019-06-21 MED ORDER — COLCHICINE 0.6 MG PO CAPS: 1.0000 | ORAL_CAPSULE | Freq: Two times a day (BID) | ORAL | 3 refills | Status: AC | PRN

## 2019-06-21 MED FILL — COLCHICINE 0.6 MG TABS: 0.6 | 30 days supply | Qty: 60 | Fill #0

## 2019-06-21 NOTE — Telephone Encounter (Signed)
Received call from Manuela Schwartz with [facility]. Rx sent over for Colchicine 0.8m capsules but directions are for tablets. Does it matter if it is a tablet or capsule?  Per SManuela Schwartz tablets are cheaper. Spoke with Dr. HJunius Roads ok to change to tablets. SManuela Schwartzaware.

## 2019-06-21 NOTE — Progress Notes (Signed)
Office Visit Note   Patient: Chad Wilson           Date of Birth: 09/27/1975           MRN: [id number] Visit Date: 06/21/2019 Requested by: Dorothyann Peng, NP [place],  [place] [address] PCP: Dorothyann Peng, NP  Subjective: Chief Complaint  Patient presents with  . Left Knee - Pain    Knee swelled again - prepatellar region. Started 06/17/19. Did work today [employer], [employer]), wearing knee pads.    HPI: He is here with recurrent left knee prepatellar bursitis.  He has had 2 aspirations and injections in the past several months.  He is not sure what triggered this flareup.  No fevers or chills.  He does have a history of chondrocalcinosis on plain x-rays.              ROS:   All other systems were reviewed and are negative.  Objective: Vital Signs: There were no vitals taken for this visit.  Physical Exam:  General:  Alert and oriented, in no acute distress. Pulm:  Breathing unlabored. Psy:  Normal mood, congruent affect. Skin: Slight warmth but no erythema in the anterior left knee. Left knee: He has 2+ prepatellar bursal swelling, slight tenderness to palpation.  No intra-articular effusion.  Imaging: None today  Assessment & Plan: 1.  Recurrent left knee prepatellar bursitis -Discussed options with him and he wants to aspirate and inject again today.  Colchicine given to take if it starts to recur. -He may need surgical excision if this becomes an ongoing problem.     Procedures: Left knee aspiration and injection: After sterile prep with Betadine, injected 4 cc 1% lidocaine without epinephrine, then aspirated 18 cc of blood-tinged synovial fluid, then injected 40 mg methylprednisolone into the prepatellar bursa.    PMFS History: Patient Active Problem List   Diagnosis Date Noted  . Neurogenic pain 10/21/2018  . Preoperative testing 08/17/2018  . Grade 1 Lumbosacral Retrolisthesis of L5/S1 06/29/2018  . Lumbar facet hypertrophy (Bilateral)  06/29/2018  . Osteoarthritis of facet joint of lumbar spine 06/29/2018  . 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  . Lumbar spondylosis 06/29/2018  . DDD (degenerative disc disease), cervical 06/29/2018  . Cervicalgia 06/29/2018  . Abnormal MRI, lumbar spine 06/29/2018  . Long term prescription benzodiazepine use 06/29/2018  . Vitamin B12 deficiency 06/28/2018  . Vitamin D insufficiency 06/28/2018  . Spondylosis without myelopathy or radiculopathy, lumbosacral region 06/28/2018  . Chronic pain syndrome 06/03/2018  . Pharmacologic therapy 06/03/2018  . Long term current use of opiate analgesic 06/03/2018  . Disorder of skeletal system 06/03/2018  . Problems influencing health status 06/03/2018  . Chronic low back pain (Primary area of Pain) (Bilateral) (R=L) w/o sciatica 06/03/2018  . DDD (degenerative disc disease), lumbosacral 04/06/2018  . Lumbar facet syndrome (Bilateral) 04/06/2018  . 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 History of cervical spinal surgery 05/21/2017  . Hypothyroidism 04/14/2017  . Chronic neck pain with history of cervical spinal surgery 09/06/2011  . Cervical radiculopathy at C6 09/06/2011  . JAUNDICE 05/21/2010  . HEPATITIS C 11/21/2009  . Anxiety and depression 11/21/2009  . PANIC ATTACK 11/21/2009  . Alcohol abuse 11/21/2009  . Tobacco abuse 11/21/2009  . HYPERTENSION, BENIGN ESSENTIAL 11/21/2009  . FATIGUE 11/21/2009   Past Medical History:  Diagnosis Date  . Acute hepatitis C without mention of hepatic coma(070.51)    has been cleared   .  Acute postoperative pain 12/10/2018  . Alcohol abuse, unspecified   . Anxiety and depression   . Anxiety states   . Essential hypertension, benign   . GERD (gastroesophageal reflux disease)   . Graves disease    treated by Dr Loanne Drilling  . Panic disorder without agoraphobia   . Thyroid disease    Graves Disease  . Tobacco use disorder     Family History  Problem  Relation Age of Onset  . Anxiety disorder Father   . Heart disease Father   . Hypertension Father   . Thyroid disease Mother   . Anxiety disorder Sister   . Hyperthyroidism Sister   . Colon cancer Neg Hx   . Esophageal cancer Neg Hx   . Pancreatic cancer Neg Hx     Past Surgical History:  Procedure Laterality Date  . ANTERIOR CERVICAL DECOMP/DISCECTOMY FUSION N/A 05/21/2017   Procedure: Anterior Cervical Decompression Fusion Cervical five-six, Cervical six-seven;  Surgeon: Eustace Moore, MD;  Location: [facility];  Service: Neurosurgery;  Laterality: N/A;  . MULTIPLE TOOTH EXTRACTIONS  15 yrs. ago  . WISDOM TOOTH EXTRACTION     Social History   Occupational History    Employer: [employer]  Tobacco Use  . Smoking status: Current Every Day Smoker    Packs/day: 1.00    Years: 30.00    Pack years: 30.00    Types: Cigarettes  . Smokeless tobacco: Current User    Types: Chew  Substance and Sexual Activity  . Alcohol use: Yes    Alcohol/week: 42.0 standard drinks    Types: 42 Cans of beer per week    Comment: 6 pack of beer daily  . Drug use: Not Currently    Comment: hx of cocaine  last dose 7 yrs ago  . Sexual activity: Not on file

## 2019-06-29 MED FILL — oxyCODONE HCL 5 MG TABS: 5 | 30 days supply | Qty: 120 | Fill #0

## 2019-06-30 ENCOUNTER — Other Ambulatory Visit: Payer: Self-pay | Admitting: Adult Health

## 2019-06-30 ENCOUNTER — Other Ambulatory Visit: Payer: Self-pay | Admitting: Gastroenterology

## 2019-06-30 MED FILL — busPIRone HCL 5 MG TABS: 5 | 30 days supply | Qty: 60 | Fill #1

## 2019-06-30 MED FILL — PREGABALIN 225 MG CAPS: 225 | 30 days supply | Qty: 60 | Fill #1

## 2019-06-30 MED FILL — ALBUTEROL SULFATE HFA 108 (: 108 (90 BAS | 25 days supply | Qty: 9 | Fill #0

## 2019-06-30 MED FILL — PANTOPRAZOLE SOD DR 40 MG T: 40 | 30 days supply | Qty: 30 | Fill #0

## 2019-06-30 NOTE — Telephone Encounter (Signed)
30 day supply sent to the pharmacy.  Pt due for cpx.

## 2019-07-09 DIAGNOSIS — Z23 Encounter for immunization: Secondary | ICD-10-CM | POA: Diagnosis not present

## 2019-07-27 ENCOUNTER — Other Ambulatory Visit: Payer: Self-pay | Admitting: Gastroenterology

## 2019-07-27 ENCOUNTER — Other Ambulatory Visit: Payer: Self-pay | Admitting: Adult Health

## 2019-07-27 ENCOUNTER — Other Ambulatory Visit: Payer: Self-pay | Admitting: Endocrinology

## 2019-07-27 DIAGNOSIS — F419 Anxiety disorder, unspecified: Secondary | ICD-10-CM

## 2019-07-28 NOTE — Telephone Encounter (Signed)
Sent to the pharmacy by e-scribe.  Pt has upcoming cpx on 09/01/19

## 2019-08-16 IMAGING — RF DG CERVICAL SPINE 2 OR 3 VIEWS
1 series · 3 of 3 positions shown · non-contrast
Comparison: Cervical spine MRI 02/02/2017

CLINICAL DATA: C5-7 ACDF

EXAM:
DG C-ARM 61-120 MIN; CERVICAL SPINE - 2-3 VIEW

[Series 1: run · 3 of 3 slices shown]
[im 1/3]
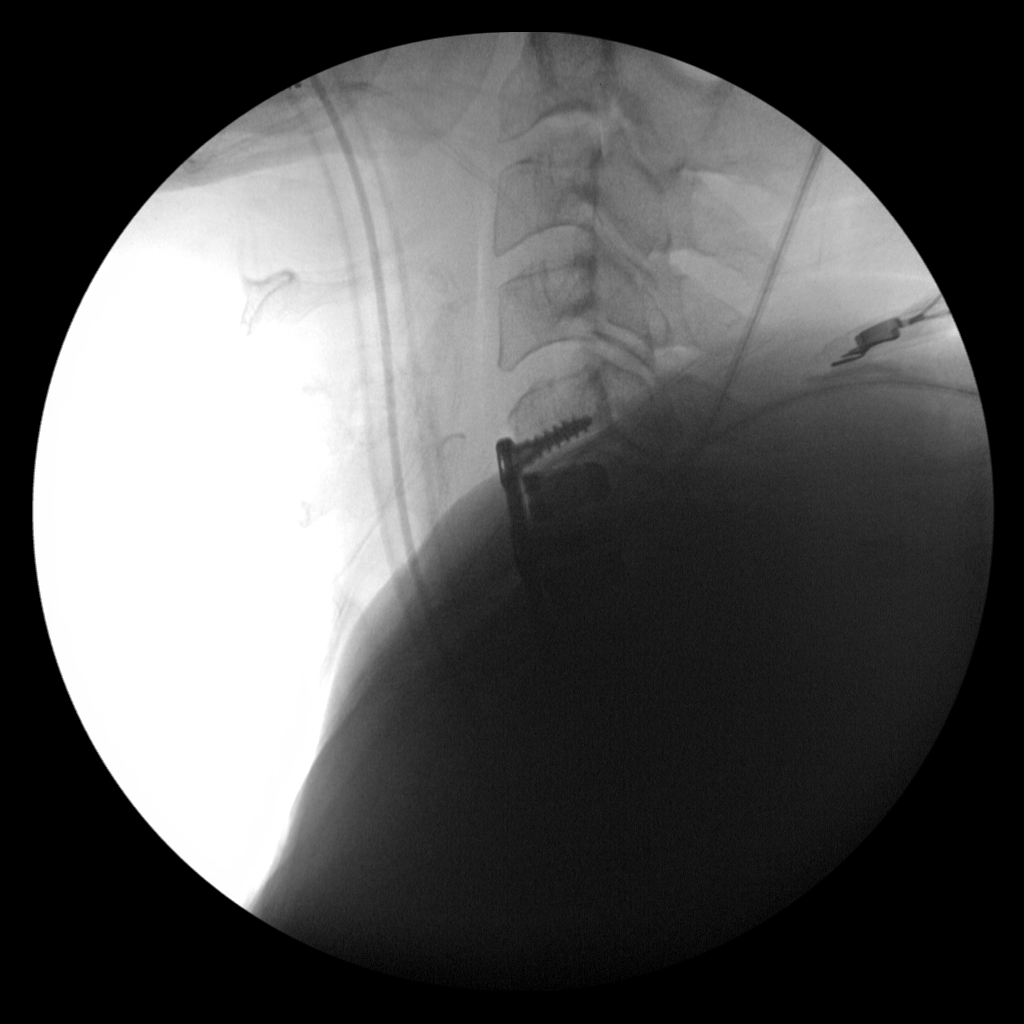
[im 2/3]
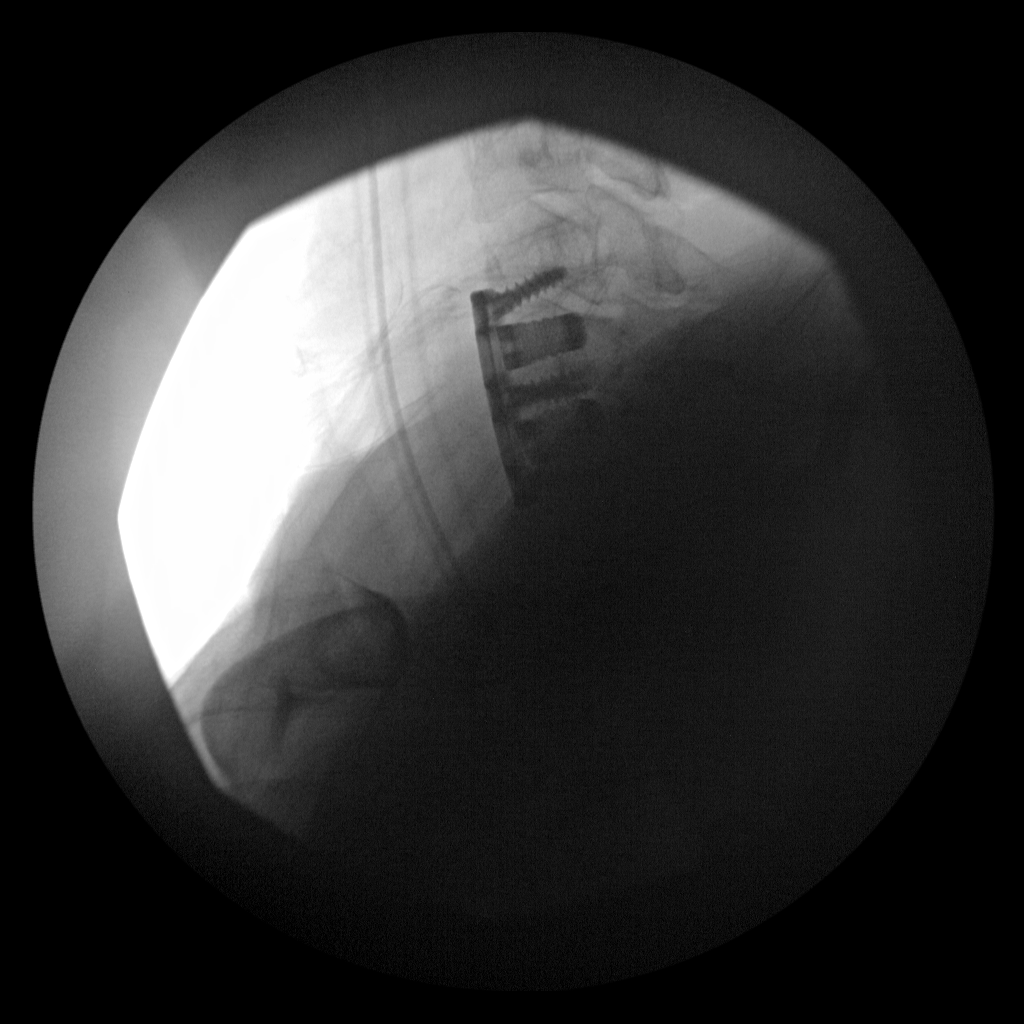
[im 3/3]
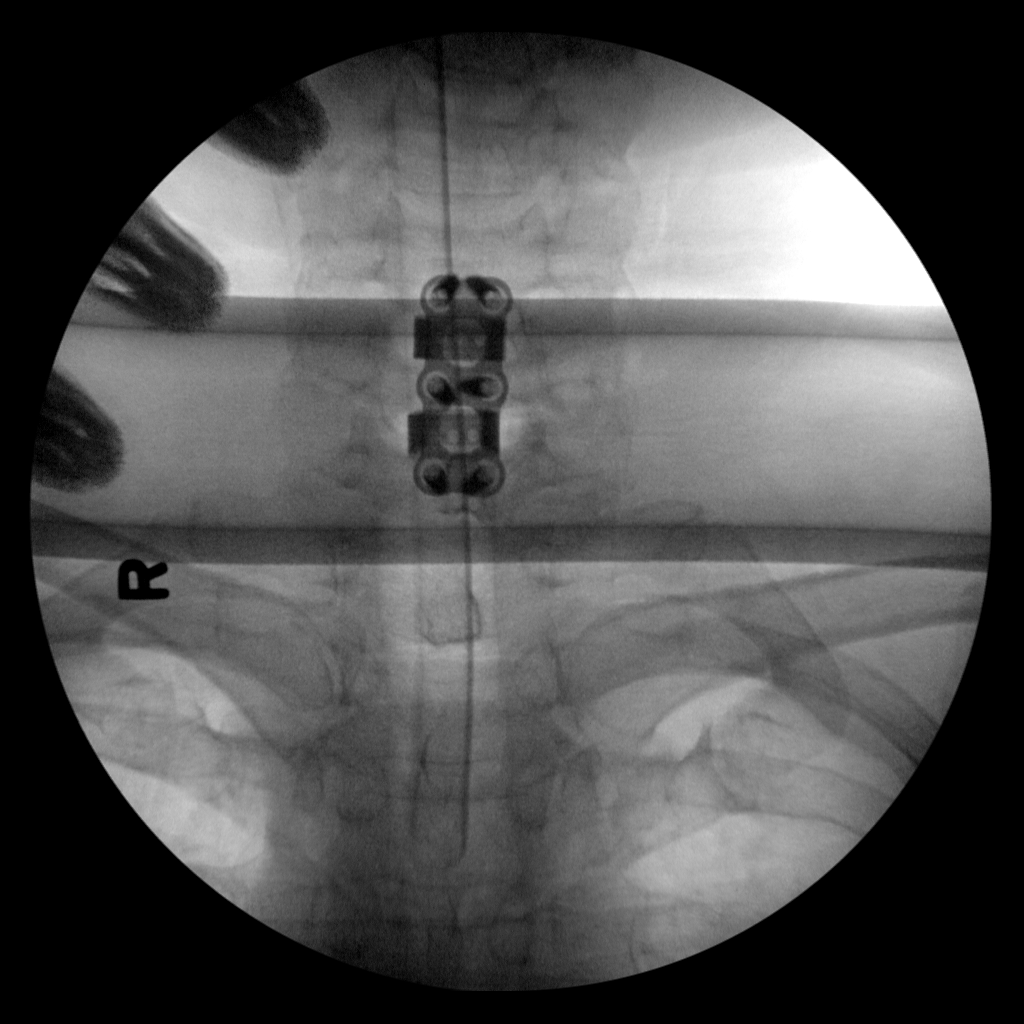

[3 of 3 positions shown; findings below may reference images not displayed]

FINDINGS: Three images from intraoperative fluoroscopy obtained during C5-C7
ACDF. 18 seconds of fluoroscopy time was reported.
IMPRESSION: Intraoperative fluoroscopy.

## 2019-08-18 MED FILL — TESTOSTERONE CYP 200 MG/ML: 200 | 28 days supply | Qty: 2 | Fill #0

## 2019-08-23 ENCOUNTER — Ambulatory Visit: Payer: 59 | Attending: Pain Medicine | Admitting: Pain Medicine

## 2019-08-23 ENCOUNTER — Other Ambulatory Visit: Payer: Self-pay

## 2019-08-23 ENCOUNTER — Encounter: Payer: Self-pay | Admitting: Pain Medicine

## 2019-08-23 VITALS — BP 157/97 | HR 83 | Temp 98.2°F | Ht 66.0 in | Wt 200.0 lb

## 2019-08-23 DIAGNOSIS — G894 Chronic pain syndrome: Secondary | ICD-10-CM | POA: Diagnosis not present

## 2019-08-23 DIAGNOSIS — M545 Low back pain: Secondary | ICD-10-CM | POA: Insufficient documentation

## 2019-08-23 DIAGNOSIS — Z79899 Other long term (current) drug therapy: Secondary | ICD-10-CM | POA: Insufficient documentation

## 2019-08-23 DIAGNOSIS — G8929 Other chronic pain: Secondary | ICD-10-CM | POA: Insufficient documentation

## 2019-08-23 DIAGNOSIS — M47816 Spondylosis without myelopathy or radiculopathy, lumbar region: Secondary | ICD-10-CM | POA: Insufficient documentation

## 2019-08-23 DIAGNOSIS — M4310 Spondylolisthesis, site unspecified: Secondary | ICD-10-CM | POA: Insufficient documentation

## 2019-08-23 MED ORDER — OXYCODONE HCL 5 MG PO TABS: 5.0000 mg | ORAL_TABLET | Freq: Four times a day (QID) | ORAL | 0 refills | Status: DC | PRN

## 2019-08-23 NOTE — Progress Notes (Signed)
Nursing Pain Medication Assessment:  Safety precautions to be maintained throughout the outpatient stay will include: orient to surroundings, keep bed in low position, maintain call bell within reach at all times, provide assistance with transfer out of bed and ambulation.  Medication Inspection Compliance: Chad Wilson did not comply with our request to bring his pills to be counted. He was reminded that bringing the medication bottles, even when empty, is a requirement.  Medication: None brought in. Pill/Patch Count: None available to be counted. Bottle Appearance: No container available. Did not bring bottle(s) to appointment. Filled Date: N/A Last Medication intake:  Today

## 2019-08-23 NOTE — Progress Notes (Signed)
Safety precautions to be maintained throughout the outpatient stay will include: orient to surroundings, keep bed in low position, maintain call bell within reach at all times, provide assistance with transfer out of bed and ambulation.  

## 2019-08-23 NOTE — Patient Instructions (Addendum)
____________________________________________________________________________________________  Drug Holidays (Slow)  What is a "Drug Holiday"? Drug Holiday: is the name given to the period of time during which a patient stops taking a medication(s) for the purpose of eliminating tolerance to the drug.  Benefits . Improved effectiveness of opioids. . Decreased opioid dose needed to achieve benefits. . Improved pain with lesser dose.  What is tolerance? Tolerance: is the progressive decreased in effectiveness of a drug due to its repetitive use. With repetitive use, the body gets use to the medication and as a consequence, it loses its effectiveness. This is a common problem seen with opioid pain medications. As a result, a larger dose of the drug is needed to achieve the same effect that used to be obtained with a smaller dose.  How long should a "Drug Holiday" last? You should stay off of the pain medicine for at least 14 consecutive days. (2 weeks)  Should I stop the medicine "cold [place]"? No. You should always coordinate with your Pain Specialist so that he/she can provide you with the correct medication dose to make the transition as smoothly as possible.  How do I stop the medicine? Slowly. You will be instructed to decrease the daily amount of pills that you take by one (1) pill every seven (7) days. This is called a "slow downward taper" of your dose. For example: if you normally take four (4) pills per day, you will be asked to drop this dose to three (3) pills per day for seven (7) days, then to two (2) pills per day for seven (7) days, then to one (1) per day for seven (7) days, and at the end of those last seven (7) days, this is when the "Drug Holiday" would start.   Will I have withdrawals? By doing a "slow downward taper" like this one, it is unlikely that you will experience any significant withdrawal symptoms. Typically, what triggers withdrawals is the sudden stop of a high  dose opioid therapy. Withdrawals can usually be avoided by slowly decreasing the dose over a prolonged period of time.  What are withdrawals? Withdrawals: refers to the wide range of symptoms that occur after stopping or dramatically reducing opiate drugs after heavy and prolonged use. Withdrawal symptoms do not occur to patients that use low dose opioids, or those who take the medication sporadically. Contrary to benzodiazepine (example: Valium, Xanax, etc.) or alcohol withdrawals ("Delirium Tremens"), opioid withdrawals are not lethal. Withdrawals are the physical manifestation of the body getting rid of the excess receptors.  Expected Symptoms Early symptoms of withdrawal may include: . Agitation . Anxiety . Muscle aches . Increased tearing . Insomnia . Runny nose . Sweating . Yawning  Late symptoms of withdrawal may include: . Abdominal cramping . Diarrhea . Dilated pupils . Goose bumps . Nausea . Vomiting  Will I experience withdrawals? Due to the slow nature of the taper, it is very unlikely that you will experience any.  What is a slow taper? Taper: refers to the gradual decrease in dose.  ___________________________________________________________________________________________    ____________________________________________________________________________________________  Medication Rules  Purpose: To inform patients, and their family members, of our rules and regulations.  Applies to: All patients receiving prescriptions (written or electronic).  Pharmacy of record: Pharmacy where electronic prescriptions will be sent. If written prescriptions are taken to a different pharmacy, please inform the nursing staff. The pharmacy listed in the electronic medical record should be the one where you would like electronic prescriptions to be sent.  Electronic  prescriptions: In compliance with the [place] (STOP) Act of 2017 (Session  Lanny Cramp [phone]), effective March 18, 2018, all controlled substances must be electronically prescribed. Calling prescriptions to the pharmacy will cease to exist.  Prescription refills: Only during scheduled appointments. Applies to all prescriptions.  NOTE: The following applies primarily to controlled substances (Opioid* Pain Medications).   Patient's responsibilities: 1. Pain Pills: Bring all pain pills to every appointment (except for procedure appointments). 2. Pill Bottles: Bring pills in original pharmacy bottle. Always bring the newest bottle. Bring bottle, even if empty. 3. Medication refills: You are responsible for knowing and keeping track of what medications you take and those you need refilled. The day before your appointment: write a list of all prescriptions that need to be refilled. The day of the appointment: give the list to the admitting nurse. Prescriptions will be written only during appointments. No prescriptions will be written on procedure days. If you forget a medication: it will not be "Called in", "Faxed", or "electronically sent". You will need to get another appointment to get these prescribed. No early refills. Do not call asking to have your prescription filled early. 4. Prescription Accuracy: You are responsible for carefully inspecting your prescriptions before leaving our office. Have the discharge nurse carefully go over each prescription with you, before taking them home. Make sure that your name is accurately spelled, that your address is correct. Check the name and dose of your medication to make sure it is accurate. Check the number of pills, and the written instructions to make sure they are clear and accurate. Make sure that you are given enough medication to last until your next medication refill appointment. 5. Taking Medication: Take medication as prescribed. When it comes to controlled substances, taking less pills or less frequently than prescribed is  permitted and encouraged. Never take more pills than instructed. Never take medication more frequently than prescribed.  6. Inform other Doctors: Always inform, all of your healthcare providers, of all the medications you take. 7. Pain Medication from other Providers: You are not allowed to accept any additional pain medication from any other Doctor or Healthcare provider. There are two exceptions to this rule. (see below) In the event that you require additional pain medication, you are responsible for notifying us, as stated below. 8. Medication Agreement: You are responsible for carefully reading and following our Medication Agreement. This must be signed before receiving any prescriptions from our practice. Safely store a copy of your signed Agreement. Violations to the Agreement will result in no further prescriptions. (Additional copies of our Medication Agreement are available upon request.) 9. Laws, Rules, & Regulations: All patients are expected to follow all Federal and [employer], [employer], Rules, [employer] [facility]. Ignorance of the Laws does not constitute a valid excuse.  10. Illegal drugs and Controlled Substances: The use of illegal substances (including, but not limited to marijuana and its derivatives) and/or the illegal use of any controlled substances is strictly prohibited. Violation of this rule may result in the immediate and permanent discontinuation of any and all prescriptions being written by our practice. The use of any illegal substances is prohibited. 11. Adopted CDC guidelines & recommendations: Target dosing levels will be at or below 60 MME/day. Use of benzodiazepines** is not recommended.  Exceptions: There are only two exceptions to the rule of not receiving pain medications from other Healthcare Providers. 1. Exception #1 (Emergencies): In the event of an emergency (i.e.: accident requiring emergency care),  you are allowed to receive additional pain medication. However, you  are responsible for: As soon as you are able, call our office [phone], at any time of the day or night, and leave a message stating your name, the date and nature of the emergency, and the name and dose of the medication prescribed. In the event that your call is answered by a member of our staff, make sure to document and save the date, time, and the name of the person that took your information.  2. Exception #2 (Planned Surgery): In the event that you are scheduled by another doctor or dentist to have any type of surgery or procedure, you are allowed (for a period no longer than 30 days), to receive additional pain medication, for the acute post-op pain. However, in this case, you are responsible for picking up a copy of our "Post-op Pain Management for Surgeons" handout, and giving it to your surgeon or dentist. This document is available at our office, and does not require an appointment to obtain it. Simply go to our office during business hours (Monday-Thursday from 8:00 AM to 4:00 PM) (Friday 8:00 AM to 12:00 Noon) or if you have a scheduled appointment with [place], prior to your surgery, and ask for it by name. In addition, you will need to provide [place] with your name, name of your surgeon, type of surgery, and date of procedure or surgery.  *Opioid medications include: morphine, codeine, oxycodone, oxymorphone, hydrocodone, hydromorphone, meperidine, tramadol, tapentadol, buprenorphine, fentanyl, methadone. **Benzodiazepine medications include: diazepam (Valium), alprazolam (Xanax), clonazepam (Klonopine), lorazepam (Ativan), clorazepate (Tranxene), chlordiazepoxide (Librium), estazolam (Prosom), oxazepam (Serax), temazepam (Restoril), triazolam (Halcion) (Last updated: 05/15/2017) ____________________________________________________________________________________________   ____________________________________________________________________________________________  Medication Recommendations  and Reminders  Applies to: All patients receiving prescriptions (written and/or electronic).  Medication Rules & Regulations: These rules and regulations exist for your safety and that of others. They are not flexible and neither are we. Dismissing or ignoring them will be considered "non-compliance" with medication therapy, resulting in complete and irreversible termination of such therapy. (See document titled "Medication Rules" for more details.) In all conscience, because of safety reasons, we cannot continue providing a therapy where the patient does not follow instructions.  Pharmacy of record:   Definition: This is the pharmacy where your electronic prescriptions will be sent.   We do not endorse any particular pharmacy.  You are not restricted in your choice of pharmacy.  The pharmacy listed in the electronic medical record should be the one where you want electronic prescriptions to be sent.  If you choose to change pharmacy, simply notify our nursing staff of your choice of new pharmacy.  Recommendations:  Keep all of your pain medications in a safe place, under lock and key, even if you live alone.   After you fill your prescription, take 1 week's worth of pills and put them away in a safe place. You should keep a separate, properly labeled bottle for this purpose. The remainder should be kept in the original bottle. Use this as your primary supply, until it runs out. Once it's gone, then you know that you have 1 week's worth of medicine, and it is time to come in for a prescription refill. If you do this correctly, it is unlikely that you will ever run out of medicine.  To make sure that the above recommendation works, it is very important that you make sure your medication refill appointments are scheduled at least 1 week before you run out  of medicine. To do this in an effective manner, make sure that you do not leave the office without scheduling your next medication management  appointment. Always ask the nursing staff to show you in your prescription , when your medication will be running out. Then arrange for the receptionist to get you a return appointment, at least 7 days before you run out of medicine. Do not wait until you have 1 or 2 pills left, to come in. This is very poor planning and does not take into consideration that we may need to cancel appointments due to bad weather, sickness, or emergencies affecting our staff.  "Partial Fill": If for any reason your pharmacy does not have enough pills/tablets to completely fill or refill your prescription, do not allow for a "partial fill". You will need a separate prescription to fill the remaining amount, which we will not provide. If the reason for the partial fill is your insurance, you will need to talk to the pharmacist about payment alternatives for the remaining tablets, but again, do not accept a partial fill.  Prescription refills and/or changes in medication(s):   Prescription refills, and/or changes in dose or medication, will be conducted only during scheduled medication management appointments. (Applies to both, written and electronic prescriptions.)  No refills on procedure days. No medication will be changed or started on procedure days. No changes, adjustments, and/or refills will be conducted on a procedure day. Doing so will interfere with the diagnostic portion of the procedure.  No phone refills. No medications will be "called into the pharmacy".  No Fax refills.  No weekend refills.  No Holliday refills.  No after hours refills.  Remember:  Business hours are:  Monday to Thursday 8:00 AM to 4:00 PM Provider's Schedule: Milinda Pointer, MD - Appointments are:  Medication management: Monday and Wednesday 8:00 AM to 4:00 PM Procedure day: Tuesday and Thursday 7:30 AM to 4:00 PM Gillis Santa, MD - Appointments are:  Medication management: Tuesday and Thursday 8:00 AM to 4:00 PM Procedure  day: Monday and Wednesday 7:30 AM to 4:00 PM (Last update: 05/15/2017) ____________________________________________________________________________________________   ____________________________________________________________________________________________  CANNABIDIOL (AKA: CBD Oil or Pills)  Applies to: All patients receiving prescriptions of controlled substances (written and/or electronic).  General Information: Cannabidiol (CBD) was discovered in 78. It is one of some 113 identified cannabinoids in cannabis (Marijuana) plants, accounting for up to 40% of the plant's extract. As of 2018, preliminary clinical research on cannabidiol included studies of anxiety, cognition, movement disorders, and pain.  Cannabidiol is consummed in multiple ways, including inhalation of cannabis smoke or vapor, as an aerosol spray into the cheek, and by mouth. It may be supplied as CBD oil containing CBD as the active ingredient (no added tetrahydrocannabinol (THC) or terpenes), a full-plant CBD-dominant hemp extract oil, capsules, dried cannabis, or as a liquid solution. CBD is thought not have the same psychoactivity as THC, and may affect the actions of THC. Studies suggest that CBD may interact with different biological targets, including cannabinoid receptors and other neurotransmitter receptors. As of 2018 the mechanism of action for its biological effects has not been determined.  In the [place], cannabidiol has a limited approval by the Food and Drug Administration (FDA) for treatment of only two types of epilepsy disorders. The side effects of long-term use of the drug include somnolence, decreased appetite, diarrhea, fatigue, malaise, weakness, sleeping problems, and others.  CBD remains a Schedule I drug prohibited for any use.  Legality: Some manufacturers  ship CBD products nationally, an illegal action which the FDA has not enforced in 2018, with CBD remaining the subject of an FDA  investigational new drug evaluation, and is not considered legal as a dietary supplement or food ingredient as of December 2018. Federal illegality has made it difficult historically to conduct research on CBD. CBD is openly sold in head shops and health food stores in some states where such sales have not been explicitly legalized.  Warning: Because it is not FDA approved for general use or treatment of pain, it is not required to undergo the same manufacturing controls as prescription drugs.  This means that the available cannabidiol (CBD) may be contaminated with THC.  If this is the case, it will trigger a positive urine drug screen (UDS) test for cannabinoids (Marijuana).  Because a positive UDS for illicit substances is a violation of our medication agreement, your opioid analgesics (pain medicine) may be permanently discontinued. (Last update: 06/05/2017) ____________________________________________________________________________________________   ____________________________________________________________________________________________  Preparing for Procedure with Sedation  Procedure appointments are limited to planned procedures: . No Prescription Refills. . No disability issues will be discussed. . No medication changes will be discussed.  Instructions: . Oral Intake: Do not eat or drink anything for at least 8 hours prior to your procedure. (Exception: Blood Pressure Medication. See below.) . Transportation: Unless otherwise stated by your physician, you may drive yourself after the procedure. . Blood Pressure Medicine: Do not forget to take your blood pressure medicine with a sip of water the morning of the procedure. If your Diastolic (lower reading)is above 100 mmHg, elective cases will be cancelled/rescheduled. . Blood thinners: These will need to be stopped for procedures. Notify our staff if you are taking any blood thinners. Depending on which one you take, there will be  specific instructions on how and when to stop it. . Diabetics on insulin: Notify the staff so that you can be scheduled 1st case in the morning. If your diabetes requires high dose insulin, take only  of your normal insulin dose the morning of the procedure and notify the staff that you have done so. . Preventing infections: Shower with an antibacterial soap the morning of your procedure. . Build-up your immune system: Take 1000 mg of Vitamin C with every meal (3 times a day) the day prior to your procedure. Marland Kitchen Antibiotics: Inform the staff if you have a condition or reason that requires you to take antibiotics before dental procedures. . Pregnancy: If you are pregnant, call and cancel the procedure. . Sickness: If you have a cold, fever, or any active infections, call and cancel the procedure. . Arrival: You must be in the facility at least 30 minutes prior to your scheduled procedure. . Children: Do not bring children with you. . Dress appropriately: Bring dark clothing that you would not mind if they get stained. . Valuables: Do not bring any jewelry or valuables.  Reasons to call and reschedule or cancel your procedure: (Following these recommendations will minimize the risk of a serious complication.) . Surgeries: Avoid having procedures within 2 weeks of any surgery. (Avoid for 2 weeks before or after any surgery). . Flu Shots: Avoid having procedures within 2 weeks of a flu shots or . (Avoid for 2 weeks before or after immunizations). . Barium: Avoid having a procedure within 7-10 days after having had a radiological study involving the use of radiological contrast. (Myelograms, Barium swallow or enema study). . Heart attacks: Avoid any elective procedures or surgeries  for the initial 6 months after a "Myocardial Infarction" (Heart Attack). . Blood thinners: It is imperative that you stop these medications before procedures. Let us know if you if you take any blood thinner.  . Infection:  Avoid procedures during or within two weeks of an infection (including chest colds or gastrointestinal problems). Symptoms associated with infections include: Localized redness, fever, chills, night sweats or profuse sweating, burning sensation when voiding, cough, congestion, stuffiness, runny nose, sore throat, diarrhea, nausea, vomiting, cold or Flu symptoms, recent or current infections. It is specially important if the infection is over the area that we intend to treat. Marland Kitchen Heart and lung problems: Symptoms that may suggest an active cardiopulmonary problem include: cough, chest pain, breathing difficulties or shortness of breath, dizziness, ankle swelling, uncontrolled high or unusually low blood pressure, and/or palpitations. If you are experiencing any of these symptoms, cancel your procedure and contact your primary care physician for an evaluation.  Remember:  Regular Business hours are:  Monday to Thursday 8:00 AM to 4:00 PM  Provider's Schedule: Milinda Pointer, MD:  Procedure days: Tuesday and Thursday 7:30 AM to 4:00 PM  Gillis Santa, MD:  Procedure days: Monday and Wednesday 7:30 AM to 4:00 PM ____________________________________________________________________________________________   ____________________________________________________________________________________________  Preparing for Procedure with Sedation  Procedure appointments are limited to planned procedures: . No Prescription Refills. . No disability issues will be discussed. . No medication changes will be discussed.  Instructions: . Oral Intake: Do not eat or drink anything for at least 8 hours prior to your procedure. (Exception: Blood Pressure Medication. See below.) . Transportation: Unless otherwise stated by your physician, you may drive yourself after the procedure. . Blood Pressure Medicine: Do not forget to take your blood pressure medicine with a sip of water the morning of the procedure. If your  Diastolic (lower reading)is above 100 mmHg, elective cases will be cancelled/rescheduled. . Blood thinners: These will need to be stopped for procedures. Notify our staff if you are taking any blood thinners. Depending on which one you take, there will be specific instructions on how and when to stop it. . Diabetics on insulin: Notify the staff so that you can be scheduled 1st case in the morning. If your diabetes requires high dose insulin, take only  of your normal insulin dose the morning of the procedure and notify the staff that you have done so. . Preventing infections: Shower with an antibacterial soap the morning of your procedure. . Build-up your immune system: Take 1000 mg of Vitamin C with every meal (3 times a day) the day prior to your procedure. Marland Kitchen Antibiotics: Inform the staff if you have a condition or reason that requires you to take antibiotics before dental procedures. . Pregnancy: If you are pregnant, call and cancel the procedure. . Sickness: If you have a cold, fever, or any active infections, call and cancel the procedure. . Arrival: You must be in the facility at least 30 minutes prior to your scheduled procedure. . Children: Do not bring children with you. . Dress appropriately: Bring dark clothing that you would not mind if they get stained. . Valuables: Do not bring any jewelry or valuables.  Reasons to call and reschedule or cancel your procedure: (Following these recommendations will minimize the risk of a serious complication.) . Surgeries: Avoid having procedures within 2 weeks of any surgery. (Avoid for 2 weeks before or after any surgery). . Flu Shots: Avoid having procedures within 2 weeks of a flu shots or . (  Avoid for 2 weeks before or after immunizations). . Barium: Avoid having a procedure within 7-10 days after having had a radiological study involving the use of radiological contrast. (Myelograms, Barium swallow or enema study). . Heart attacks: Avoid any  elective procedures or surgeries for the initial 6 months after a "Myocardial Infarction" (Heart Attack). . Blood thinners: It is imperative that you stop these medications before procedures. Let us know if you if you take any blood thinner.  . Infection: Avoid procedures during or within two weeks of an infection (including chest colds or gastrointestinal problems). Symptoms associated with infections include: Localized redness, fever, chills, night sweats or profuse sweating, burning sensation when voiding, cough, congestion, stuffiness, runny nose, sore throat, diarrhea, nausea, vomiting, cold or Flu symptoms, recent or current infections. It is specially important if the infection is over the area that we intend to treat. Marland Kitchen Heart and lung problems: Symptoms that may suggest an active cardiopulmonary problem include: cough, chest pain, breathing difficulties or shortness of breath, dizziness, ankle swelling, uncontrolled high or unusually low blood pressure, and/or palpitations. If you are experiencing any of these symptoms, cancel your procedure and contact your primary care physician for an evaluation.  Remember:  Regular Business hours are:  Monday to Thursday 8:00 AM to 4:00 PM  Provider's Schedule: Milinda Pointer, MD:  Procedure days: Tuesday and Thursday 7:30 AM to 4:00 PM  Gillis Santa, MD:  Procedure days: Monday and Wednesday 7:30 AM to 4:00 PM ____________________________________________________________________________________________ Three prescriptions for Oxycodone have been sent to your pharmacy.

## 2019-08-23 NOTE — Progress Notes (Signed)
PROVIDER NOTE: Information contained herein reflects review and annotations entered in association with encounter. Interpretation of such information and data should be left to medically-trained personnel. Information provided to patient can be located elsewhere in the medical record under "Patient Instructions". Document created using STT-dictation technology, any transcriptional errors that may result from process are unintentional.    Patient: Chad Wilson  Service Category: E/M  Provider: [name], MD  DOB: 31-Jan-1976  DOS: 08/23/2019  Specialty: Interventional Pain Management  MRN: [id number]  Setting: Ambulatory outpatient  PCP: Dorothyann Peng, NP  Type: Established Patient    Referring Provider: Dorothyann Peng, NP  Location: Office  Delivery: Face-to-face     HPI  Reason for encounter: Chad Wilson, a 44 y.o. year old male, is here today for evaluation and management of his Chronic pain syndrome [G89.4]. Mr. Dolley primary complain today is Back Pain Last encounter: Practice (05/24/2019). My last encounter with him was on Visit date not found. Pertinent problems: Mr. Knoblock has Chronic neck pain with history of cervical spinal surgery; Cervical radiculopathy at C6; Status post lumbar spine surgery for decompression of spinal cord; History of cervical spinal surgery; DDD (degenerative disc disease), lumbosacral; Lumbar facet syndrome (Bilateral); Chronic pain syndrome; Chronic low back pain (Primary area of Pain) (Bilateral) (R=L) w/o sciatica; Spondylosis without myelopathy or radiculopathy, lumbosacral region; Grade 1 Lumbosacral Retrolisthesis of L5/S1; Lumbar facet hypertrophy (Bilateral); Osteoarthritis of facet joint of lumbar spine; Osteoarthritis of lumbar spine; Lumbar spondylosis; DDD (degenerative disc disease), cervical; Cervicalgia; Abnormal MRI, lumbar spine; and Neurogenic pain on their pertinent problem list. Pain Assessment: Severity of Chronic pain is reported as a 7 /10.  Location: Back Lower/Denies, right foot becomes numb at times. Onset: More than a month ago. Quality: [place], [place], [place]. Timing: Constant. Modifying factor(s): sitting, meds. Vitals:  height is 5' 6"  (1.676 m) and weight is 200 lb (90.7 kg). His temperature is 98.2 F (36.8 C). His blood pressure is 157/97 (abnormal) and his pulse is 83. His oxygen saturation is 99%.   The patient comes into the clinics today for medication management as well as for evaluation prior to interventional therapy.  He indicated that his low back pain has been increasing and while he is sitting down he has no symptoms but when he stands up, after a certain amount of time he begins to experience pain across the lower back, which is worse on the right side.  He denies any lower extremity pain.  Today's provocative maneuvers were positive bilaterally for hyperextension and rotation with exact reproduction of the patient's low back pain, ipsilateral to the test.  In addition, the patient had a straight leg raise that was within normal limits bilaterally, and the Patrick maneuver was positive only on the right side for pain in the right lower back, possibly coming from the SI joint area. On 12/10/2018 the patient had a right-sided lumbar facet RFA, while on 01/05/2019 he had the left side.  It would seem that these are wearing off.  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  Pharmacotherapy Assessment  Analgesic: Oxycodone IR 5 mg, 1 tab PO q 6 hrs (20 mg/day of oxycodone) MME/day: 30 mg/day.   Monitoring: [place] PMP: PDMP reviewed during this encounter.       Pharmacotherapy: No side-effects or adverse reactions reported. Compliance: No problems identified. Effectiveness: Clinically acceptable.  UDS:  Summary  Date Value Ref Range Status  06/03/2018 FINAL  Final    Comment:    ====================================================================  [facility] COMP DRUG  ANALYSIS,UR ==================================================================== Test                             Result       Flag       Units Drug Present   Lorazepam                      219                     ng/mg creat    Source of lorazepam is a scheduled prescription medication.   Hydrocodone                    1871                    ng/mg creat   Hydromorphone                  463                     ng/mg creat   Dihydrocodeine                 252                     ng/mg creat   Norhydrocodone                 1617                    ng/mg creat    Sources of hydrocodone include scheduled prescription    medications. Hydromorphone, dihydrocodeine and norhydrocodone are    expected metabolites of hydrocodone. Hydromorphone and    dihydrocodeine are also available as scheduled prescription    medications.   Zolpidem                       PRESENT   Zolpidem Acid                  PRESENT    Zolpidem acid is an expected metabolite of zolpidem.   Sertraline                     PRESENT   Desmethylsertraline            PRESENT    Desmethylsertraline is an expected metabolite of sertraline.   Acetaminophen                  PRESENT ==================================================================== Test                      Result    Flag   Units      Ref Range   Creatinine              52               mg/dL      >=20 ==================================================================== Declared Medications:  Medication list was not provided. ==================================================================== For clinical consultation, please call [phone]. ====================================================================    ROS  Constitutional: Denies any fever or chills Gastrointestinal: No reported hemesis, hematochezia, vomiting, or acute GI distress Musculoskeletal: Denies any acute onset joint swelling, redness, loss of ROM, or weakness Neurological: No  reported episodes of acute onset apraxia, aphasia, dysarthria, agnosia, amnesia, paralysis, loss of coordination, or loss of consciousness  Medication Review  Colchicine, albuterol, atorvastatin, busPIRone, clonazePAM, levothyroxine, lisinopril, omeprazole, oxyCODONE, pantoprazole, pregabalin, sertraline, sildenafil, and testosterone cypionate  History Review  Allergy: Mr. Becker is allergic to chantix [varenicline]. Drug: Mr. Harmes  reports previous drug use. Alcohol:  reports current alcohol use of about 42.0 standard drinks of alcohol per week. Tobacco:  reports that he has been smoking cigarettes. He has a 30.00 pack-year smoking history. His smokeless tobacco use includes chew. Social: Mr. Beckers  reports that he has been smoking cigarettes. He has a 30.00 pack-year smoking history. His smokeless tobacco use includes chew. He reports current alcohol use of about 42.0 standard drinks of alcohol per week. He reports previous drug use. Medical:  has a past medical history of Acute hepatitis C without mention of hepatic coma(070.51), Acute postoperative pain (12/10/2018), Alcohol abuse, unspecified, Anxiety and depression, Anxiety states, Essential hypertension, benign, GERD (gastroesophageal reflux disease), Graves disease, Panic disorder without agoraphobia, Thyroid disease, and Tobacco use disorder. Surgical: Mr. Perlmutter  has a past surgical history that includes Multiple tooth extractions (15 yrs. ago); Anterior cervical decomp/discectomy fusion (N/A, 05/21/2017); and Wisdom tooth extraction. Family: family history includes Anxiety disorder in his father and sister; Heart disease in his father; Hypertension in his father; Hyperthyroidism in his sister; Thyroid disease in his mother.  Laboratory Chemistry Profile   Renal Lab Results  Component Value Date   BUN 17 10/05/2018   CREATININE 1.07 10/05/2018   BCR 8 (L) 06/03/2018   GFR 74.96 02/17/2018   GFRAA >60 10/05/2018   GFRNONAA >60 10/05/2018      Hepatic Lab Results  Component Value Date   AST 22 10/05/2018   ALT 56 (H) 10/05/2018   ALBUMIN 4.0 10/05/2018   ALKPHOS 87 10/05/2018   HCVAB (A) 09/29/2009    Reactive (NOTE) Result repeated and verified.                                                                       This test is for screening purposes only.  Reactive results should be confirmed by an alternative method.  Suggest HCV Qualitative, PCR, test code  83130.  Specimens will be stable for reflex testing up to 3 days after collection.   LIPASE 26 10/05/2018     Electrolytes Lab Results  Component Value Date   NA 141 10/05/2018   K 3.2 (L) 10/05/2018   CL 101 10/05/2018   CALCIUM 8.8 (L) 10/05/2018   MG 2.1 06/03/2018     Bone Lab Results  Component Value Date   VD25OH 21.01 (L) 02/17/2018   TESTOFREE See below 02/17/2018   TESTOSTERONE 147 (L) 02/17/2018     Inflammation (CRP: Acute Phase) (ESR: Chronic Phase) Lab Results  Component Value Date   CRP 1 06/03/2018   ESRSEDRATE 21 (H) 06/03/2018       Note: Above Lab results reviewed.  Recent Imaging Review  XR KNEE 3 VIEW LEFT X-rays demonstrate medial compartment chondrocalcinosis.  No other acute  findings Note: Reviewed        Physical Exam  General appearance: Well nourished, well developed, and well hydrated. In no apparent acute distress Mental status: Alert, oriented x 3 (person, place, & time)       Respiratory: No evidence  of acute respiratory distress Eyes: PERLA Vitals: BP (!) 157/97   Pulse 83   Temp 98.2 F (36.8 C)   Ht 5' 6"  (1.676 m)   Wt 200 lb (90.7 kg)   SpO2 99%   BMI 32.28 kg/m  BMI: Estimated body mass index is 32.28 kg/m as calculated from the following:   Height as of this encounter: 5' 6"  (1.676 m).   Weight as of this encounter: 200 lb (90.7 kg). Ideal: Ideal body weight: 63.8 kg (140 lb 10.5 oz) Adjusted ideal body weight: 74.6 kg (164 lb 6.3 oz) Lumbar Exam  Skin & Axial Inspection: No masses,  redness, or swelling Alignment: Symmetrical Functional ROM: Decreased ROM affecting both sides Stability: No instability detected Muscle Tone/Strength: Increased muscle tone over affected area Sensory (Neurological): Movement-associated pain Palpation: Complains of area being tender to palpation       Provocative Tests: Hyperextension/rotation test: (+) bilaterally for facet joint pain. Lumbar quadrant test (Kemp's test): (+) bilaterally for facet joint pain. Lateral bending test: deferred today       Patrick's Maneuver: (+) for right-sided S-I arthralgia             FABER* test: (+) for right-sided S-I arthralgia             S-I anterior distraction/compression test: deferred today         S-I lateral compression test: deferred today         S-I Thigh-thrust test: deferred today         S-I Gaenslen's test: deferred today         *(Flexion, ABduction and External Rotation)  Lower Extremity Exam    Side: Right lower extremity  Side: Left lower extremity  Stability: No instability observed          Stability: No instability observed          Skin & Extremity Inspection: Skin color, temperature, and hair growth are WNL. No peripheral edema or cyanosis. No masses, redness, swelling, asymmetry, or associated skin lesions. No contractures.  Skin & Extremity Inspection: Skin color, temperature, and hair growth are WNL. No peripheral edema or cyanosis. No masses, redness, swelling, asymmetry, or associated skin lesions. No contractures.  Functional ROM: Unrestricted ROM         SLR WNL  Functional ROM: Unrestricted ROM         SLR WNL  Muscle Tone/Strength: Able to Toe-walk & Heel-walk without problems  Muscle Tone/Strength: Able to Toe-walk & Heel-walk without problems  Sensory (Neurological): Unimpaired        Sensory (Neurological): Unimpaired        DTR: Patellar: deferred today Achilles: deferred today Plantar: deferred today  DTR: Patellar: deferred today Achilles: deferred  today Plantar: deferred today  Palpation: No palpable anomalies  Palpation: No palpable anomalies   Assessment   Status Diagnosis  Worsening Worsening Recurring 1. Chronic pain syndrome   2. Chronic low back pain (Primary area of Pain) (Bilateral) (R=L) w/o sciatica   3. Lumbar facet syndrome (Bilateral)   4. Grade 1 Lumbosacral Retrolisthesis of L5/S1   5. Pharmacologic therapy      Updated Problems: No problems updated.  Plan of Care  Problem-specific:  No problem-specific Assessment & Plan notes found for this encounter.  Mr. Chad Arvizu has a current medication list which includes the following long-term medication(s): albuterol, atorvastatin, clonazepam, colchicine, depo-testosterone, levothyroxine, lisinopril, pantoprazole, pregabalin, sertraline, [START ON 08/27/2019] oxycodone, [START ON 09/26/2019] oxycodone, [START ON 10/26/2019] oxycodone, and [  DISCONTINUED] omeprazole.  Pharmacotherapy (Medications Ordered): Meds ordered this encounter  Medications  . oxyCODONE (OXY IR/ROXICODONE) 5 MG immediate release tablet    Sig: Take 1 tablet (5 mg total) by mouth every 6 (six) hours as needed for severe pain. Must last 30 days    Dispense:  120 tablet    Refill:  0    Chronic Pain: STOP Act (Not applicable) Fill 1 day early if closed on refill date. Do not fill until: 08/27/2019. To last until: 09/26/2019. Avoid benzodiazepines within 8 hours of opioids  . oxyCODONE (OXY IR/ROXICODONE) 5 MG immediate release tablet    Sig: Take 1 tablet (5 mg total) by mouth every 6 (six) hours as needed for severe pain. Must last 30 days    Dispense:  120 tablet    Refill:  0    Chronic Pain: STOP Act (Not applicable) Fill 1 day early if closed on refill date. Do not fill until: 09/26/2019. To last until: 10/26/2019. Avoid benzodiazepines within 8 hours of opioids  . oxyCODONE (OXY IR/ROXICODONE) 5 MG immediate release tablet    Sig: Take 1 tablet (5 mg total) by mouth every 6 (six) hours as needed  for severe pain. Must last 30 days    Dispense:  120 tablet    Refill:  0    Chronic Pain: STOP Act (Not applicable) Fill 1 day early if closed on refill date. Do not fill until: 10/26/2019. To last until: 11/25/2019. Avoid benzodiazepines within 8 hours of opioids   Orders:  Orders Placed This Encounter  Procedures  . LUMBAR FACET(MEDIAL BRANCH NERVE BLOCK) MBNB    Standing Status:   Future    Standing Expiration Date:   09/22/2019    Scheduling Instructions:     Procedure: Lumbar facet block (AKA.: Lumbosacral medial branch nerve block)     Side: Bilateral     Level: L3-4, L4-5, & L5-S1 Facets (L2, L3, L4, L5, & S1 Medial Branch Nerves)     Sedation: Patient's choice.     Timeframe: ASAA    Order Specific Question:   Where will this procedure be performed?    Answer:   [facility]  . ToxASSURE Select 13 (MW), Urine    Volume: 30 ml(s). Minimum 3 ml of urine is needed. Document temperature of fresh sample. Indications: Long term (current) use of opiate analgesic (N82.956)    Order Specific Question:   Release to patient    Answer:   Immediate   Follow-up plan:   Return in about 3 months (around 11/17/2019) for (F2F), (MM), in addition, Procedure (w/ sedation): (B) L-FCT BLK.      Considering:   Therapeutic bilateral lumbar facet RFA  (we will start on the left side) (scheduled for 12/10/2018)   PRN Procedures:   Palliative/therapeutic bilateral lumbar facet block #3  Palliative right-sided lumbar facet RFA #2 (last done 12/10/2018) (100/100/100/100) Palliative left-sided lumbar facet RFA #2 (last done 01/05/2019) (100/100/50/90-100)    Recent Visits No visits were found meeting these conditions.  Showing recent visits within past 90 days and meeting all other requirements   Today's Visits Date Type Provider Dept  08/23/19 Office Visit Milinda Pointer, MD [facility]  Showing today's visits and meeting all other requirements   Future Appointments Date Type  Provider Dept  11/17/19 Appointment Milinda Pointer, MD [facility]  Showing future appointments within next 90 days and meeting all other requirements   I discussed the assessment and treatment plan with  the patient. The patient was provided an opportunity to ask questions and all were answered. The patient agreed with the plan and demonstrated an understanding of the instructions.  Patient advised to call back or seek an in-person evaluation if the symptoms or condition worsens.  Duration of encounter: 30 minutes.  Note by: [name], MD Date: 08/23/2019; Time: 9:05 AM

## 2019-08-25 ENCOUNTER — Other Ambulatory Visit: Payer: Self-pay | Admitting: Adult Health

## 2019-08-25 ENCOUNTER — Other Ambulatory Visit: Payer: Self-pay | Admitting: Endocrinology

## 2019-08-25 DIAGNOSIS — F419 Anxiety disorder, unspecified: Secondary | ICD-10-CM

## 2019-08-26 ENCOUNTER — Other Ambulatory Visit: Payer: Self-pay | Admitting: Adult Health

## 2019-08-26 NOTE — Telephone Encounter (Signed)
Message routed to PCP CMA  

## 2019-08-26 NOTE — Telephone Encounter (Signed)
This is filled by Dr. Cordelia Pen office.

## 2019-08-26 NOTE — Telephone Encounter (Signed)
Pt is overdue for appt. Will not be able to refill Rx without an appt. Pt will need to call the office to schedule an appt for both f/u and labs.

## 2019-08-27 LAB — TOXASSURE SELECT 13 (MW), URINE

## 2019-08-30 ENCOUNTER — Encounter: Payer: Self-pay | Admitting: Adult Health

## 2019-08-30 ENCOUNTER — Other Ambulatory Visit: Payer: Self-pay | Admitting: Adult Health

## 2019-08-30 MED FILL — LEVOTHYROXINE SODIUM 150 MC: 150 | 30 days supply | Qty: 30 | Fill #0

## 2019-09-01 ENCOUNTER — Other Ambulatory Visit: Payer: Self-pay

## 2019-09-02 ENCOUNTER — Ambulatory Visit (INDEPENDENT_AMBULATORY_CARE_PROVIDER_SITE_OTHER): Payer: 59 | Admitting: Adult Health

## 2019-09-02 ENCOUNTER — Encounter: Payer: Self-pay | Admitting: Adult Health

## 2019-09-02 VITALS — BP 124/84 | Temp 98.1°F | Ht 67.5 in | Wt 206.0 lb

## 2019-09-02 DIAGNOSIS — E890 Postprocedural hypothyroidism: Secondary | ICD-10-CM | POA: Diagnosis not present

## 2019-09-02 DIAGNOSIS — Z125 Encounter for screening for malignant neoplasm of prostate: Secondary | ICD-10-CM

## 2019-09-02 DIAGNOSIS — Z0000 Encounter for general adult medical examination without abnormal findings: Secondary | ICD-10-CM | POA: Diagnosis not present

## 2019-09-02 DIAGNOSIS — F419 Anxiety disorder, unspecified: Secondary | ICD-10-CM

## 2019-09-02 DIAGNOSIS — R7989 Other specified abnormal findings of blood chemistry: Secondary | ICD-10-CM

## 2019-09-02 DIAGNOSIS — G894 Chronic pain syndrome: Secondary | ICD-10-CM

## 2019-09-02 DIAGNOSIS — Z720 Tobacco use: Secondary | ICD-10-CM

## 2019-09-02 DIAGNOSIS — F329 Major depressive disorder, single episode, unspecified: Secondary | ICD-10-CM | POA: Diagnosis not present

## 2019-09-02 DIAGNOSIS — E782 Mixed hyperlipidemia: Secondary | ICD-10-CM | POA: Insufficient documentation

## 2019-09-02 LAB — CBC WITH DIFFERENTIAL/PLATELET
Basophils Absolute: 0 10*3/uL (ref 0.0–0.1)
Basophils Relative: 0.5 % (ref 0.0–3.0)
Eosinophils Absolute: 0.2 10*3/uL (ref 0.0–0.7)
Eosinophils Relative: 2 % (ref 0.0–5.0)
HCT: 41.9 % (ref 39.0–52.0)
Hemoglobin: 14.6 g/dL (ref 13.0–17.0)
Lymphocytes Relative: 20.8 % (ref 12.0–46.0)
Lymphs Abs: 1.9 10*3/uL (ref 0.7–4.0)
MCHC: 34.8 g/dL (ref 30.0–36.0)
MCV: 95.7 fl (ref 78.0–100.0)
Monocytes Absolute: 0.8 10*3/uL (ref 0.1–1.0)
Monocytes Relative: 9.1 % (ref 3.0–12.0)
Neutro Abs: 6.3 10*3/uL (ref 1.4–7.7)
Neutrophils Relative %: 67.6 % (ref 43.0–77.0)
Platelets: 156 10*3/uL (ref 150.0–400.0)
RBC: 4.38 Mil/uL (ref 4.22–5.81)
RDW: 14.2 % (ref 11.5–15.5)
WBC: 9.3 10*3/uL (ref 4.0–10.5)

## 2019-09-02 LAB — COMPREHENSIVE METABOLIC PANEL
ALT: 43 U/L (ref 0–53)
AST: 20 U/L (ref 0–37)
Albumin: 4.5 g/dL (ref 3.5–5.2)
Alkaline Phosphatase: 83 U/L (ref 39–117)
BUN: 13 mg/dL (ref 6–23)
CO2: 30 mEq/L (ref 19–32)
Calcium: 9.3 mg/dL (ref 8.4–10.5)
Chloride: 102 mEq/L (ref 96–112)
Creatinine, Ser: 0.92 mg/dL (ref 0.40–1.50)
GFR: 88.79 mL/min (ref >60.00)
Glucose, Bld: 83 mg/dL (ref 70–99)
Potassium: 3.6 mEq/L (ref 3.5–5.1)
Sodium: 139 mEq/L (ref 135–145)
Total Bilirubin: 0.5 mg/dL (ref 0.2–1.2)
Total Protein: 6.9 g/dL (ref 6.0–8.3)

## 2019-09-02 LAB — LIPID PANEL
Cholesterol: 131 mg/dL (ref 0–200)
HDL: 39.8 mg/dL (ref >39.00)
NonHDL: 90.77
Total CHOL/HDL Ratio: 3
Triglycerides: 245 mg/dL — ABNORMAL HIGH (ref 0.0–149.0)
VLDL: 49 mg/dL — ABNORMAL HIGH (ref 0.0–40.0)

## 2019-09-02 LAB — PSA: PSA: 0.61 ng/mL (ref 0.10–4.00)

## 2019-09-02 LAB — TSH: TSH: 11.76 u[IU]/mL — ABNORMAL HIGH (ref 0.35–4.50)

## 2019-09-02 LAB — LDL CHOLESTEROL, DIRECT: Direct LDL: 62 mg/dL

## 2019-09-02 LAB — HEMOGLOBIN A1C: Hgb A1c MFr Bld: 6.1 % (ref 4.6–6.5)

## 2019-09-02 MED ORDER — ALPRAZOLAM 0.5 MG PO TABS: 0.5000 mg | ORAL_TABLET | Freq: Two times a day (BID) | ORAL | 2 refills | Status: DC

## 2019-09-02 MED FILL — ALPRAZolam 0.5 MG TABS: 0.5 | 30 days supply | Qty: 60 | Fill #0

## 2019-09-02 NOTE — Patient Instructions (Signed)
I have switched your klonopin to xanax  I have also referred you to behavioral health for further management   I will follow up with you regarding your blood work   Please work on quitting tobacco

## 2019-09-02 NOTE — Progress Notes (Signed)
Subjective:    Patient ID: Chad Wilson, male    DOB: Oct 26, 1975, 44 y.o.   MRN: [id number]  HPI Patient presents for yearly preventative medicine examination. He is a pleasant 44 year old male who  has a past medical history of Acute hepatitis C without mention of hepatic coma(070.51), Acute postoperative pain (12/10/2018), Alcohol abuse, unspecified, Anxiety and depression, Anxiety states, Essential hypertension, benign, GERD (gastroesophageal reflux disease), Graves disease, Panic disorder without agoraphobia, Thyroid disease, and Tobacco use disorder.  Graves Disease -is managed by endocrinology, Dr. Loanne Drilling.  Currently prescribed Synthroid 150 mcg  Anxiety and depression -takes Zoloft 200 mg daily and uses Klonopin 0.5 mg twice daily as needed as well as BuSpar 5 mg twice daily.  He feels as though his anxiety is " through the roof still". Does not feel like Klonopin helps   Hyperlipidemia -currently prescribed Lipitor 20 mg daily.  He denies myalgia or fatigue Lab Results  Component Value Date   CHOL 215 (H) 10/17/2017   HDL 44.80 10/17/2017   LDLCALC 66 10/30/2016   LDLDIRECT 105.0 10/17/2017   TRIG (H) 10/17/2017    550.0 Triglyceride is over 400; calculations on Lipids are invalid.   CHOLHDL 5 10/17/2017   GERD - controlled with Protonix 40 mg daily.   Chronic back pain-he is managed by pain management.  Currently prescribed oxycodone 5 mg every 6 hours and Lyrica 225 mg twice daily. He is going back in a few weeks for nerve blocks  Low T - does testosterone injections every 14 days. Prescribed by Urology   Tobacco use - continues to smoke about a pack a day and uses chewing tobacco to cut back on smoking   Alcohol Use - has cut back from 18 beers a week to 4-6 beers a week.   All immunizations and health maintenance protocols were reviewed with the patient and needed orders were placed.  Appropriate screening laboratory values were ordered for the patient including screening  of hyperlipidemia, renal function and hepatic function. If indicated by BPH, a PSA was ordered.  Medication reconciliation,  past medical history, social history, problem list and allergies were reviewed in detail with the patient  Wt Readings from Last 3 Encounters:  09/02/19 206 lb (93.4 kg)  08/23/19 200 lb (90.7 kg)  04/16/19 200 lb (90.7 kg)    Goals were established with regard to weight loss, exercise, and  diet in compliance with medications  He is up to date on colon cancer screening  Review of Systems  Constitutional: Negative.   HENT: Negative.   Eyes: Negative.   Respiratory: Negative.   Cardiovascular: Negative.   Gastrointestinal: Negative.   Endocrine: Negative.   Genitourinary: Negative.   Musculoskeletal: Positive for arthralgias and back pain.  Skin: Negative.   Allergic/Immunologic: Negative.   Neurological: Negative.   Hematological: Negative.   Psychiatric/Behavioral: Negative.   All other systems reviewed and are negative.  Past Medical History:  Diagnosis Date  . Acute hepatitis C without mention of hepatic coma(070.51)    has been cleared   . Acute postoperative pain 12/10/2018  . Alcohol abuse, unspecified   . Anxiety and depression   . Anxiety states   . Essential hypertension, benign   . GERD (gastroesophageal reflux disease)   . Graves disease    treated by Dr Loanne Drilling  . Panic disorder without agoraphobia   . Thyroid disease    Graves Disease  . Tobacco use disorder     Social History  Socioeconomic History  . Marital status: Married    Spouse name: Not on file  . Number of children: 0  . Years of education: Not on file  . Highest education level: Not on file  Occupational History    Employer: [employer]  Tobacco Use  . Smoking status: Current Every Day Smoker    Packs/day: 1.00    Years: 30.00    Pack years: 30.00    Types: Cigarettes  . Smokeless tobacco: Current User    Types: Chew  Vaping Use  . Vaping  Use: Never used  Substance and Sexual Activity  . Alcohol use: Yes    Alcohol/week: 42.0 standard drinks    Types: 42 Cans of beer per week    Comment: 6 pack of beer daily  . Drug use: Not Currently    Comment: hx of cocaine  last dose 7 yrs ago  . Sexual activity: Not on file  Other Topics Concern  . Not on file  Social History Narrative   He is working in Architect    Diet: Fruits and veggies, water poweraid, occ sweet tea   Social Determinants of Radio broadcast assistant Strain:   . Difficulty of Paying Living Expenses:   Food Insecurity:   . Worried About Charity fundraiser in the Last Year:   . Arboriculturist in the Last Year:   Transportation Needs:   . Film/video editor (Medical):   Marland Kitchen Lack of Transportation (Non-Medical):   Physical Activity:   . Days of Exercise per Week:   . Minutes of Exercise per Session:   Stress:   . Feeling of Stress :   Social Connections:   . Frequency of Communication with Friends and Family:   . Frequency of Social Gatherings with Friends and Family:   . Attends Religious Services:   . Active Member of Clubs or Organizations:   . Attends Archivist Meetings:   Marland Kitchen Marital Status:   Intimate Partner Violence:   . Fear of Current or Ex-Partner:   . Emotionally Abused:   Marland Kitchen Physically Abused:   . Sexually Abused:     Past Surgical History:  Procedure Laterality Date  . ANTERIOR CERVICAL DECOMP/DISCECTOMY FUSION N/A 05/21/2017   Procedure: Anterior Cervical Decompression Fusion Cervical five-six, Cervical six-seven;  Surgeon: Eustace Moore, MD;  Location: [facility];  Service: Neurosurgery;  Laterality: N/A;  . MULTIPLE TOOTH EXTRACTIONS  15 yrs. ago  . WISDOM TOOTH EXTRACTION      Family History  Problem Relation Age of Onset  . Anxiety disorder Father   . Heart disease Father   . Hypertension Father   . Thyroid disease Mother   . Anxiety disorder Sister   . Hyperthyroidism Sister   . Colon cancer Neg Hx   .  Esophageal cancer Neg Hx   . Pancreatic cancer Neg Hx     Allergies  Allergen Reactions  . Chantix [Varenicline] Other (See Comments)    Agitation and sleep walking     Current Outpatient Medications on File Prior to Visit  Medication Sig Dispense Refill  . albuterol (VENTOLIN HFA) 108 (90 Base) MCG/ACT inhaler INHALE 2 PUFFS BY MOUTH EVERY 6 HOURS AS NEEDED. **DUE FOR YEARLY PHYSICAL** 8.5 g 0  . atorvastatin (LIPITOR) 20 MG tablet TAKE 1 TABLET (20 MG TOTAL) BY MOUTH DAILY. **DUE FOR YEARLY PHYSICAL** 30 tablet 0  . busPIRone (BUSPAR) 5 MG tablet TAKE 1 TABLET BY MOUTH 2  TIMES DAILY. 60 tablet 1  . clonazePAM (KLONOPIN) 0.5 MG tablet TAKE 1 TABLET BY MOUTH TWICE A DAY AS NEEDED FOR ANXIETY. 30 tablet 0  . Colchicine 0.6 MG CAPS Take 1 tablet by mouth 2 (two) times daily as needed. 60 capsule 3  . DEPO-TESTOSTERONE 200 MG/ML injection Take 200 mg by mouth every 14 (fourteen) days.    Marland Kitchen levothyroxine (SYNTHROID) 150 MCG tablet TAKE 1 TABLET BY MOUTH DAILY. 30 tablet 0  . lisinopril (ZESTRIL) 40 MG tablet TAKE 1 TABLET BY MOUTH DAILY 90 tablet 1  . oxyCODONE (OXY IR/ROXICODONE) 5 MG immediate release tablet Take 1 tablet (5 mg total) by mouth every 6 (six) hours as needed for severe pain. Must last 30 days 120 tablet 0  . [START ON 09/26/2019] oxyCODONE (OXY IR/ROXICODONE) 5 MG immediate release tablet Take 1 tablet (5 mg total) by mouth every 6 (six) hours as needed for severe pain. Must last 30 days 120 tablet 0  . [START ON 10/26/2019] oxyCODONE (OXY IR/ROXICODONE) 5 MG immediate release tablet Take 1 tablet (5 mg total) by mouth every 6 (six) hours as needed for severe pain. Must last 30 days 120 tablet 0  . pantoprazole (PROTONIX) 40 MG tablet TAKE 1 TABLET (40 MG TOTAL) BY MOUTH DAILY. 30 tablet 6  . pregabalin (LYRICA) 225 MG capsule Take 1 capsule (225 mg total) by mouth 2 (two) times daily. 60 capsule 5  . sertraline (ZOLOFT) 100 MG tablet TAKE 2 TABLETS (200 MG TOTAL) BY MOUTH DAILY.  60 tablet 1  . sildenafil (REVATIO) 20 MG tablet     . [DISCONTINUED] omeprazole (PRILOSEC) 20 MG capsule Take 1 capsule (20 mg total) by mouth daily. 30 capsule 3   No current facility-administered medications on file prior to visit.    There were no vitals taken for this visit.      Objective:   Physical Exam Vitals and nursing note reviewed.  Constitutional:      General: He is not in acute distress.    Appearance: Normal appearance. He is well-developed. He is obese.  HENT:     Head: Normocephalic and atraumatic.     Right Ear: Tympanic membrane, ear canal and external ear normal. There is no impacted cerumen.     Left Ear: Tympanic membrane, ear canal and external ear normal. There is no impacted cerumen.     Nose: Nose normal. No congestion or rhinorrhea.     Mouth/Throat:     Mouth: Mucous membranes are moist.     Pharynx: Oropharynx is clear. No oropharyngeal exudate or posterior oropharyngeal erythema.  Eyes:     General:        Right eye: No discharge.        Left eye: No discharge.     Extraocular Movements: Extraocular movements intact.     Conjunctiva/sclera: Conjunctivae normal.     Pupils: Pupils are equal, round, and reactive to light.  Neck:     Vascular: No carotid bruit.     Trachea: No tracheal deviation.  Cardiovascular:     Rate and Rhythm: Normal rate and regular rhythm.     Pulses: Normal pulses.     Heart sounds: Normal heart sounds. No murmur heard.  No friction rub. No gallop.   Pulmonary:     Effort: Pulmonary effort is normal. No respiratory distress.     Breath sounds: No stridor. Wheezing (trace throughout ) present. No rhonchi or rales.  Chest:     Chest  wall: No tenderness.  Abdominal:     General: Bowel sounds are normal. There is no distension.     Palpations: Abdomen is soft. There is no mass.     Tenderness: There is no abdominal tenderness. There is no right CVA tenderness, left CVA tenderness, guarding or rebound.     Hernia: No  hernia is present.  Musculoskeletal:        General: No swelling, tenderness, deformity or signs of injury. Normal range of motion.     Right lower leg: No edema.     Left lower leg: No edema.  Lymphadenopathy:     Cervical: No cervical adenopathy.  Skin:    General: Skin is warm and dry.     Capillary Refill: Capillary refill takes less than 2 seconds.     Coloration: Skin is not jaundiced or pale.     Findings: No bruising, erythema, lesion or rash.  Neurological:     General: No focal deficit present.     Mental Status: He is alert and oriented to person, place, and time.     Cranial Nerves: No cranial nerve deficit.     Sensory: No sensory deficit.     Motor: No weakness.     Coordination: Coordination normal.     Gait: Gait normal.     Deep Tendon Reflexes: Reflexes normal.  Psychiatric:        Mood and Affect: Mood normal.        Behavior: Behavior normal.        Thought Content: Thought content normal.        Judgment: Judgment normal.       Assessment & Plan:  1. Routine general medical examination at a health care facility - Encouraged to quit smoking  - Follow up in one year or sooner if needed - Needs to start losing weight through diet and exercise  - CBC with Differential/Platelet - Comprehensive metabolic panel - Hemoglobin A1c - Lipid panel - TSH  2. Anxiety and depression - D/c klonopin and will trial him on Xanax - Ambulatory referral to Psychiatry - ALPRAZolam (XANAX) 0.5 MG tablet; Take 1 tablet (0.5 mg total) by mouth 2 (two) times daily.  Dispense: 60 tablet; Refill: 2  3. Low testosterone - Follow up with Urology   4. Prostate cancer screening - PSA   5. Postablative hypothyroidism - TSH  - Follow up with Endocrinology   6. Chronic pain syndrome - Follow up with pain management as directed   7. Tobacco use - needs to quit all types of tobacco. Advised follow up if he wants help    8. Mixed hyperlipidemia - Consider increase in  statin  - CBC with Differential/Platelet - Comprehensive metabolic panel - Hemoglobin A1c - Lipid panel - TSH  Dorothyann Peng, NP

## 2019-09-03 ENCOUNTER — Other Ambulatory Visit: Payer: Self-pay | Admitting: Adult Health

## 2019-09-03 ENCOUNTER — Other Ambulatory Visit: Payer: Self-pay | Admitting: Family Medicine

## 2019-09-03 MED ORDER — ATORVASTATIN CALCIUM 40 MG PO TABS
40.0000 mg | ORAL_TABLET | Freq: Every day | ORAL | 3 refills | Status: DC
Start: 2019-09-03 — End: 2019-09-03

## 2019-09-03 MED FILL — ATORVASTATIN 40 MG TABLET: 40 | 90 days supply | Qty: 90 | Fill #0

## 2019-09-03 NOTE — Telephone Encounter (Signed)
SENT TO THE PHARMACY BY E-SCRIBE. 

## 2019-09-13 MED FILL — PANTOPRAZOLE SOD DR 40 MG T: 40 | 30 days supply | Qty: 30 | Fill #1

## 2019-09-15 NOTE — Progress Notes (Signed)
PROVIDER NOTE: Information contained herein reflects review and annotations entered in association with encounter. Interpretation of such information and data should be left to medically-trained personnel. Information provided to patient can be located elsewhere in the medical record under "Patient Instructions". Document created using STT-dictation technology, any transcriptional errors that may result from process are unintentional.    Patient: Chad Wilson  Service Category: Procedure  Provider: [name], MD  DOB: 07-06-1975  DOS: 09/16/2019  Location: [facility]  MRN: [id number]  Setting: Ambulatory - outpatient  Referring Provider: Dorothyann Peng, NP  Type: Established Patient  Specialty: Interventional Pain Management  PCP: Dorothyann Peng, NP   Primary Reason for Visit: Interventional Pain Management Treatment. CC: Back Pain  Procedure:          Anesthesia, Analgesia, Anxiolysis:  Type: Lumbar Facet, Medial Branch Block(s) #3  Primary Purpose: Therapeutic Region: Posterolateral Lumbosacral Spine Level: L2, L3, L4, L5, & S1 Medial Branch Level(s). Injecting these levels blocks the L3-4, L4-5, and L5-S1 lumbar facet joints. Laterality: Bilateral  Type: Moderate (Conscious) Sedation combined with Local Anesthesia Indication(s): Analgesia and Anxiety Route: Intravenous (IV) IV Access: Secured Sedation: Meaningful verbal contact was maintained at all times during the procedure  Local Anesthetic: Lidocaine 1-2%  Position: Prone   Indications: 1. Lumbar facet syndrome (Bilateral)   2. Spondylosis without myelopathy or radiculopathy, lumbosacral region   3. Osteoarthritis of facet joint of lumbar spine   4. Lumbar facet hypertrophy (Bilateral)   5. DDD (degenerative disc disease), lumbosacral   6. Chronic low back pain (Primary area of Pain) (Bilateral) (R=L) w/o sciatica    Pain Score: Pre-procedure: 7 /10 Post-procedure: 0-No pain/10   Pre-op Assessment:    Chad Wilson is a 44 y.o. (year old), male patient, seen today for interventional treatment. He  has a past surgical history that includes Multiple tooth extractions (15 yrs. ago); Anterior cervical decomp/discectomy fusion (N/A, 05/21/2017); and Wisdom tooth extraction. Mr. Jacinto has a current medication list which includes the following prescription(s): albuterol, alprazolam, atorvastatin, buspirone, colchicine, depo-testosterone, levothyroxine, lisinopril, oxycodone, [START ON 09/26/2019] oxycodone, [START ON 10/26/2019] oxycodone, pantoprazole, pregabalin, sertraline, sildenafil, and [DISCONTINUED] omeprazole, and the following Facility-Administered Medications: fentanyl and midazolam. His primarily concern today is the Back Pain  Initial Vital Signs:  Pulse/HCG Rate: 63ECG Heart Rate: 65 Temp: (!) 97.2 F (36.2 C) Resp: 15 BP: (!) 133/98 SpO2: 96 %  BMI: Estimated body mass index is 32.6 kg/m as calculated from the following:   Height as of this encounter: 5' 6"  (1.676 m).   Weight as of this encounter: 202 lb (91.6 kg).  Risk Assessment: Allergies: Reviewed. He is allergic to chantix [varenicline].  Allergy Precautions: None required Coagulopathies: Reviewed. None identified.  Blood-thinner therapy: None at this time Active Infection(s): Reviewed. None identified. Mr. Prokop is afebrile  Site Confirmation: Mr. Hillock was asked to confirm the procedure and laterality before marking the site Procedure checklist: Completed Consent: Before the procedure and under the influence of no sedative(s), amnesic(s), or anxiolytics, the patient was informed of the treatment options, risks and possible complications. To fulfill our ethical and legal obligations, as recommended by the American Medical Association's Code of Ethics, I have informed the patient of my clinical impression; the nature and purpose of the treatment or procedure; the risks, benefits, and possible complications of the intervention; the  alternatives, including doing nothing; the risk(s) and benefit(s) of the alternative treatment(s) or procedure(s); and the risk(s) and benefit(s) of doing nothing. The patient was  provided information about the general risks and possible complications associated with the procedure. These may include, but are not limited to: failure to achieve desired goals, infection, bleeding, organ or nerve damage, allergic reactions, paralysis, and death. In addition, the patient was informed of those risks and complications associated to Spine-related procedures, such as failure to decrease pain; infection (i.e.: Meningitis, epidural or intraspinal abscess); bleeding (i.e.: epidural hematoma, subarachnoid hemorrhage, or any other type of intraspinal or peri-dural bleeding); organ or nerve damage (i.e.: Any type of peripheral nerve, nerve root, or spinal cord injury) with subsequent damage to sensory, motor, and/or autonomic systems, resulting in permanent pain, numbness, and/or weakness of one or several areas of the body; allergic reactions; (i.e.: anaphylactic reaction); and/or death. Furthermore, the patient was informed of those risks and complications associated with the medications. These include, but are not limited to: allergic reactions (i.e.: anaphylactic or anaphylactoid reaction(s)); adrenal axis suppression; blood sugar elevation that in diabetics may result in ketoacidosis or comma; water retention that in patients with history of congestive heart failure may result in shortness of breath, pulmonary edema, and decompensation with resultant heart failure; weight gain; swelling or edema; medication-induced neural toxicity; particulate matter embolism and blood vessel occlusion with resultant organ, and/or nervous system infarction; and/or aseptic necrosis of one or more joints. Finally, the patient was informed that Medicine is not an exact science; therefore, there is also the possibility of unforeseen or  unpredictable risks and/or possible complications that may result in a catastrophic outcome. The patient indicated having understood very clearly. We have given the patient no guarantees and we have made no promises. Enough time was given to the patient to ask questions, all of which were answered to the patient's satisfaction. Mr. Offerdahl has indicated that he wanted to continue with the procedure. Attestation: I, the ordering provider, attest that I have discussed with the patient the benefits, risks, side-effects, alternatives, likelihood of achieving goals, and potential problems during recovery for the procedure that I have provided informed consent. Date   Time: 09/16/2019  8:34 AM  Pre-Procedure Preparation:  Monitoring: As per clinic protocol. Respiration, ETCO2, SpO2, BP, heart rate and rhythm monitor placed and checked for adequate function Safety Precautions: Patient was assessed for positional comfort and pressure points before starting the procedure. Time-out: I initiated and conducted the "Time-out" before starting the procedure, as per protocol. The patient was asked to participate by confirming the accuracy of the "Time Out" information. Verification of the correct person, site, and procedure were performed and confirmed by me, the nursing staff, and the patient. "Time-out" conducted as per Joint Commission's Universal Protocol (UP.01.01.01). Time: 0932  Description of Procedure:          Laterality: Bilateral. The procedure was performed in identical fashion on both sides. Levels:  L2, L3, L4, L5, & S1 Medial Branch Level(s) Area Prepped: Posterior Lumbosacral Region DuraPrep (Iodine Povacrylex [0.7% available iodine] and Isopropyl Alcohol, 74% w/w) Safety Precautions: Aspiration looking for blood return was conducted prior to all injections. At no point did we inject any substances, as a needle was being advanced. Before injecting, the patient was told to immediately notify me if he was  experiencing any new onset of "ringing in the ears, or metallic taste in the mouth". No attempts were made at seeking any paresthesias. Safe injection practices and needle disposal techniques used. Medications properly checked for expiration dates. SDV (single dose vial) medications used. After the completion of the procedure, all disposable equipment used was  discarded in the proper designated medical waste containers. Local Anesthesia: Protocol guidelines were followed. The patient was positioned over the fluoroscopy table. The area was prepped in the usual manner. The time-out was completed. The target area was identified using fluoroscopy. A 12-in long, straight, sterile hemostat was used with fluoroscopic guidance to locate the targets for each level blocked. Once located, the skin was marked with an approved surgical skin marker. Once all sites were marked, the skin (epidermis, dermis, and hypodermis), as well as deeper tissues (fat, connective tissue and muscle) were infiltrated with a small amount of a short-acting local anesthetic, loaded on a 10cc syringe with a 25G, 1.5-in  Needle. An appropriate amount of time was allowed for local anesthetics to take effect before proceeding to the next step. Local Anesthetic: Lidocaine 2.0% The unused portion of the local anesthetic was discarded in the proper designated containers. Technical explanation of process:  L2 Medial Branch Nerve Block (MBB): The target area for the L2 medial branch is at the junction of the postero-lateral aspect of the superior articular process and the superior, posterior, and medial edge of the transverse process of L3. Under fluoroscopic guidance, a Quincke needle was inserted until contact was made with os over the superior postero-lateral aspect of the pedicular shadow (target area). After negative aspiration for blood, 0.5 mL of the nerve block solution was injected without difficulty or complication. The needle was removed  intact. L3 Medial Branch Nerve Block (MBB): The target area for the L3 medial branch is at the junction of the postero-lateral aspect of the superior articular process and the superior, posterior, and medial edge of the transverse process of L4. Under fluoroscopic guidance, a Quincke needle was inserted until contact was made with os over the superior postero-lateral aspect of the pedicular shadow (target area). After negative aspiration for blood, 0.5 mL of the nerve block solution was injected without difficulty or complication. The needle was removed intact. L4 Medial Branch Nerve Block (MBB): The target area for the L4 medial branch is at the junction of the postero-lateral aspect of the superior articular process and the superior, posterior, and medial edge of the transverse process of L5. Under fluoroscopic guidance, a Quincke needle was inserted until contact was made with os over the superior postero-lateral aspect of the pedicular shadow (target area). After negative aspiration for blood, 0.5 mL of the nerve block solution was injected without difficulty or complication. The needle was removed intact. L5 Medial Branch Nerve Block (MBB): The target area for the L5 medial branch is at the junction of the postero-lateral aspect of the superior articular process and the superior, posterior, and medial edge of the sacral ala. Under fluoroscopic guidance, a Quincke needle was inserted until contact was made with os over the superior postero-lateral aspect of the pedicular shadow (target area). After negative aspiration for blood, 0.5 mL of the nerve block solution was injected without difficulty or complication. The needle was removed intact. S1 Medial Branch Nerve Block (MBB): The target area for the S1 medial branch is at the posterior and inferior 6 o'clock position of the L5-S1 facet joint. Under fluoroscopic guidance, the Quincke needle inserted for the L5 MBB was redirected until contact was made with  os over the inferior and postero aspect of the sacrum, at the 6 o' clock position under the L5-S1 facet joint (Target area). After negative aspiration for blood, 0.5 mL of the nerve block solution was injected without difficulty or complication. The needle was removed  intact.  Nerve block solution: 0.2% PF-Ropivacaine + Triamcinolone (40 mg/mL) diluted to a final concentration of 4 mg of Triamcinolone/mL of Ropivacaine The unused portion of the solution was discarded in the proper designated containers. Procedural Needles: 22-gauge, 3.5-inch, Quincke needles used for all levels.  Once the entire procedure was completed, the treated area was cleaned, making sure to leave some of the prepping solution back to take advantage of its long term bactericidal properties.   Illustration of the posterior view of the lumbar spine and the posterior neural structures. Laminae of L2 through S1 are labeled. DPRL5, dorsal primary ramus of L5; DPRS1, dorsal primary ramus of S1; DPR3, dorsal primary ramus of L3; FJ, facet (zygapophyseal) joint L3-L4; I, inferior articular process of L4; LB1, lateral branch of dorsal primary ramus of L1; IAB, inferior articular branches from L3 medial branch (supplies L4-L5 facet joint); IBP, intermediate branch plexus; MB3, medial branch of dorsal primary ramus of L3; NR3, third lumbar nerve root; S, superior articular process of L5; SAB, superior articular branches from L4 (supplies L4-5 facet joint also); TP3, transverse process of L3.  Vitals:   09/16/19 0943 09/16/19 0953 09/16/19 1003 09/16/19 1012  BP: (!) 144/102 130/70 128/72 124/70  Pulse:      Resp: 13 12 14 14   Temp:  (!) 97.4 F (36.3 C)  (!) 97.4 F (36.3 C)  SpO2: 96% 98% 99% 98%  Weight:      Height:         Start Time: 0932 hrs. End Time: 0940 hrs.  Imaging Guidance (Spinal):          Type of Imaging Technique: Fluoroscopy Guidance (Spinal) Indication(s): Assistance in needle guidance and placement for  procedures requiring needle placement in or near specific anatomical locations not easily accessible without such assistance. Exposure Time: Please see nurses notes. Contrast: None used. Fluoroscopic Guidance: I was personally present during the use of fluoroscopy. "Tunnel Vision Technique" used to obtain the best possible view of the target area. Parallax error corrected before commencing the procedure. "Direction-depth-direction" technique used to introduce the needle under continuous pulsed fluoroscopy. Once target was reached, antero-posterior, oblique, and lateral fluoroscopic projection used confirm needle placement in all planes. Images permanently stored in EMR. Interpretation: No contrast injected. I personally interpreted the imaging intraoperatively. Adequate needle placement confirmed in multiple planes. Permanent images saved into the patient's record.  Antibiotic Prophylaxis:   Anti-infectives (From admission, onward)   None     Indication(s): None identified  Post-operative Assessment:  Post-procedure Vital Signs:  Pulse/HCG Rate: 6367 Temp: (!) 97.4 F (36.3 C) Resp: 14 BP: 124/70 SpO2: 98 %  EBL: None  Complications: No immediate post-treatment complications observed by team, or reported by patient.  Note: The patient tolerated the entire procedure well. A repeat set of vitals were taken after the procedure and the patient was kept under observation following institutional policy, for this type of procedure. Post-procedural neurological assessment was performed, showing return to baseline, prior to discharge. The patient was provided with post-procedure discharge instructions, including a section on how to identify potential problems. Should any problems arise concerning this procedure, the patient was given instructions to immediately contact us, at any time, without hesitation. In any case, we plan to contact the patient by telephone for a follow-up status report  regarding this interventional procedure.  Comments:  No additional relevant information.  Plan of Care  Orders:  Orders Placed This Encounter  Procedures   LUMBAR FACET(MEDIAL BRANCH NERVE BLOCK) MBNB  Scheduling Instructions:     Procedure: Lumbar facet block (AKA.: Lumbosacral medial branch nerve block)     Side: Bilateral     Level: L3-4, L4-5, & L5-S1 Facets (L2, L3, L4, L5, & S1 Medial Branch Nerves)     Sedation: Patient's choice.     Timeframe: Today    Order Specific Question:   Where will this procedure be performed?    Answer:   [facility]   DG [facility] C-ARM 1-60 MIN NO REPORT    Intraoperative interpretation by procedural physician at [facility].    Standing Status:   Standing    Number of Occurrences:   1    Order Specific Question:   Reason for exam:    Answer:   Assistance in needle guidance and placement for procedures requiring needle placement in or near specific anatomical locations not easily accessible without such assistance.   Informed Consent Details: Physician/Practitioner Attestation; Transcribe to consent form and obtain patient signature    Nursing Order: Transcribe to consent form and obtain patient signature. Note: Always confirm laterality of pain with Mr. Balistreri, before procedure. Procedure: Lumbar Facet Block  under fluoroscopic guidance Indication/Reason: Low Back Pain, with our without leg pain, due to Facet Joint Arthralgia (Joint Pain) known as Lumbar Facet Syndrome, secondary to Lumbar, and/or Lumbosacral Spondylosis (Arthritis of the Spine), without myelopathy or radiculopathy (Nerve Damage). Provider Attestation: I, [place] Dossie Arbour, MD, (Pain Management Specialist), the physician/practitioner, attest that I have discussed with the patient the benefits, risks, side effects, alternatives, likelihood of achieving goals and potential problems during recovery for the procedure that I have provided informed consent.    Provide equipment / supplies at bedside    Equipment required: Single use, disposable, "Block Tray"    Standing Status:   Standing    Number of Occurrences:   1    Order Specific Question:   Specify    Answer:   Block Tray   Chronic Opioid Analgesic:  Oxycodone IR 5 mg, 1 tab PO q 6 hrs (20 mg/day of oxycodone) MME/day: 30 mg/day.   Medications ordered for procedure: Meds ordered this encounter  Medications   lidocaine (XYLOCAINE) 2 % (with pres) injection 400 mg   lactated ringers infusion 1,000 mL   midazolam (VERSED) 5 MG/5ML injection 1-2 mg    Make sure Flumazenil is available in the pyxis when using this medication. If oversedation occurs, administer 0.2 mg IV over 15 sec. If after 45 sec no response, administer 0.2 mg again over 1 min; may repeat at 1 min intervals; not to exceed 4 doses (1 mg)   fentaNYL (SUBLIMAZE) injection 25-50 mcg    Make sure Narcan is available in the pyxis when using this medication. In the event of respiratory depression (RR< 8/min): Titrate NARCAN (naloxone) in increments of 0.1 to 0.2 mg IV at 2-3 minute intervals, until desired degree of reversal.   ropivacaine (PF) 2 mg/mL (0.2%) (NAROPIN) injection 18 mL   triamcinolone acetonide (KENALOG-40) injection 80 mg   Medications administered: We administered lidocaine, lactated ringers, midazolam, fentaNYL, ropivacaine (PF) 2 mg/mL (0.2%), and triamcinolone acetonide.  See the medical record for exact dosing, route, and time of administration.  Follow-up plan:   Return in about 2 weeks (around 09/30/2019) for (VV), (PP), on afternoon of procedure day.       Considering:   Therapeutic bilateral lumbar facet RFA  (we will start on the left side) (scheduled for 12/10/2018)   PRN Procedures:  Palliative/therapeutic bilateral lumbar facet block #3  Palliative right-sided lumbar facet RFA #2 (last done 12/10/2018) (100/100/100/100) Palliative left-sided lumbar facet RFA #2 (last done 01/05/2019)  (100/100/50/90-100)     Recent Visits Date Type Provider Dept  08/23/19 Office Visit Milinda Pointer, MD [facility]  Showing recent visits within past 90 days and meeting all other requirements Today's Visits Date Type Provider Dept  09/16/19 Procedure visit Milinda Pointer, MD [facility]  Showing today's visits and meeting all other requirements Future Appointments Date Type Provider Dept  09/30/19 Appointment Milinda Pointer, [facility]  11/17/19 Appointment Milinda Pointer, MD [facility]  Showing future appointments within next 90 days and meeting all other requirements  Disposition: Discharge home  Discharge (Date   Time): 09/16/2019; 1013 hrs.   Primary Care Physician: Dorothyann Peng, NP Location: [facility] Note by: [name], MD Date: 09/16/2019; Time: 10:19 AM  Disclaimer:  Medicine is not an exact science. The only guarantee in medicine is that nothing is guaranteed. It is important to note that the decision to proceed with this intervention was based on the information collected from the patient. The Data and conclusions were drawn from the patient's questionnaire, the interview, and the physical examination. Because the information was provided in large part by the patient, it cannot be guaranteed that it has not been purposely or unconsciously manipulated. Every effort has been made to obtain as much relevant data as possible for this evaluation. It is important to note that the conclusions that lead to this procedure are derived in large part from the available data. Always take into account that the treatment will also be dependent on availability of resources and existing treatment guidelines, considered by other Pain Management Practitioners as being common knowledge and practice, at the time of the intervention. For Medico-Legal purposes, it is also important to point out that  variation in procedural techniques and pharmacological choices are the acceptable norm. The indications, contraindications, technique, and results of the above procedure should only be interpreted and judged by a Board-Certified Interventional Pain Specialist with extensive familiarity and expertise in the same exact procedure and technique.

## 2019-09-16 ENCOUNTER — Encounter: Payer: Self-pay | Admitting: Pain Medicine

## 2019-09-16 ENCOUNTER — Ambulatory Visit
Admission: RE | Admit: 2019-09-16 | Discharge: 2019-09-16 | Disposition: A | Discharge status: Home / Self Care | Payer: 59 | Source: Ambulatory Visit | Attending: Pain Medicine | Admitting: Pain Medicine

## 2019-09-16 ENCOUNTER — Ambulatory Visit (HOSPITAL_BASED_OUTPATIENT_CLINIC_OR_DEPARTMENT_OTHER): Payer: 59 | Admitting: Pain Medicine

## 2019-09-16 ENCOUNTER — Other Ambulatory Visit: Payer: Self-pay

## 2019-09-16 VITALS — BP 124/70 | HR 63 | Temp 97.4°F | Resp 14 | Ht 66.0 in | Wt 202.0 lb

## 2019-09-16 DIAGNOSIS — M5137 Other intervertebral disc degeneration, lumbosacral region: Secondary | ICD-10-CM | POA: Insufficient documentation

## 2019-09-16 DIAGNOSIS — G8929 Other chronic pain: Secondary | ICD-10-CM | POA: Diagnosis not present

## 2019-09-16 DIAGNOSIS — M545 Low back pain, unspecified: Secondary | ICD-10-CM

## 2019-09-16 DIAGNOSIS — M47816 Spondylosis without myelopathy or radiculopathy, lumbar region: Secondary | ICD-10-CM | POA: Diagnosis not present

## 2019-09-16 DIAGNOSIS — M47817 Spondylosis without myelopathy or radiculopathy, lumbosacral region: Secondary | ICD-10-CM

## 2019-09-16 MED ORDER — LACTATED RINGERS IV SOLN
1000.0000 mL | Freq: Once | INTRAVENOUS | Status: AC
  Administered 2019-09-16: 1000 mL via INTRAVENOUS

## 2019-09-16 MED ORDER — ROPIVACAINE HCL 2 MG/ML IJ SOLN
18.0000 mL | Freq: Once | INTRAMUSCULAR | Status: AC
  Administered 2019-09-16: 18 mL via PERINEURAL
  Filled 2019-09-16: qty 20

## 2019-09-16 MED ORDER — TRIAMCINOLONE ACETONIDE 40 MG/ML IJ SUSP
80.0000 mg | Freq: Once | INTRAMUSCULAR | Status: AC
  Administered 2019-09-16: 80 mg
  Filled 2019-09-16: qty 2

## 2019-09-16 MED ORDER — LIDOCAINE HCL 2 % IJ SOLN
20.0000 mL | Freq: Once | INTRAMUSCULAR | Status: AC
  Administered 2019-09-16: 400 mg
  Filled 2019-09-16: qty 20

## 2019-09-16 MED ORDER — MIDAZOLAM HCL 5 MG/5ML IJ SOLN
1.0000 mg | INTRAMUSCULAR | Status: DC | PRN
  Administered 2019-09-16: 2 mg via INTRAVENOUS
  Filled 2019-09-16: qty 5

## 2019-09-16 MED ORDER — FENTANYL CITRATE (PF) 100 MCG/2ML IJ SOLN
25.0000 ug | INTRAMUSCULAR | Status: DC | PRN
  Administered 2019-09-16: 50 ug via INTRAVENOUS
  Filled 2019-09-16: qty 2

## 2019-09-16 NOTE — Progress Notes (Signed)
Safety precautions to be maintained throughout the outpatient stay will include: orient to surroundings, keep bed in low position, maintain call bell within reach at all times, provide assistance with transfer out of bed and ambulation.  

## 2019-09-16 NOTE — Patient Instructions (Signed)

## 2019-09-17 ENCOUNTER — Encounter: Payer: Self-pay | Admitting: Family Medicine

## 2019-09-17 ENCOUNTER — Telehealth: Payer: Self-pay

## 2019-09-17 ENCOUNTER — Ambulatory Visit (INDEPENDENT_AMBULATORY_CARE_PROVIDER_SITE_OTHER): Payer: 59 | Admitting: Family Medicine

## 2019-09-17 DIAGNOSIS — M25562 Pain in left knee: Secondary | ICD-10-CM | POA: Diagnosis not present

## 2019-09-17 DIAGNOSIS — M25522 Pain in left elbow: Secondary | ICD-10-CM

## 2019-09-17 MED ORDER — NITROGLYCERIN 0.1 MG/HR TD PT24
MEDICATED_PATCH | TRANSDERMAL | 3 refills | Status: AC
Start: 2019-09-17 — End: ?

## 2019-09-17 MED ORDER — NABUMETONE 750 MG PO TABS
750.0000 mg | ORAL_TABLET | Freq: Two times a day (BID) | ORAL | 6 refills | Status: AC | PRN
Start: 2019-09-17 — End: ?

## 2019-09-17 MED FILL — NABUMETONE 750 MG TABS: 750 | 30 days supply | Qty: 60 | Fill #0

## 2019-09-17 MED FILL — NITROGLYCERIN 0.1 MG/HR PTC: 0.1 | 80 days supply | Qty: 20 | Fill #0

## 2019-09-17 NOTE — Progress Notes (Signed)
Office Visit Note   Patient: Chad Wilson           Date of Birth: 08/03/1975           MRN: [id number] Visit Date: 09/17/2019 Requested by: Dorothyann Peng, NP [place],  [place] [address] PCP: Dorothyann Peng, NP  Subjective: Chief Complaint  Patient presents with  . Left Knee - Pain    Prepatellar bursa is starting to fill up again.  . Left Elbow - Pain    Lateral elbow pain x 3 weeks. Pain runs down his forearm. Wearing elbow strap. It does help.    HPI: He is here with left elbow and knee pain.  His left prepatellar bursitis has recurred.  We have aspirated and injected it 3 times now.  It is not yet painful, but is uncomfortable.  He works in [employer].  He has chronic intermittent bilateral elbow pain.  His left leg started bothering him about 3 weeks ago.  He wears an over-the-counter tennis elbow strap with good results.  He is using ibuprofen.               ROS:   All other systems were reviewed and are negative.  Objective: Vital Signs: There were no vitals taken for this visit.  Physical Exam:  General:  Alert and oriented, in no acute distress. Pulm:  Breathing unlabored. Psy:  Normal mood, congruent affect. Skin: No erythema Left elbow: Full range of motion no effusion.  He has tenderness at the common extensor tendon at the lateral epicondyle.  He has a little bit of tenderness near the radial tunnel as well as some myofascial trigger points.  He has pain with wrist extension and third finger extension against resistance. Left knee: He has 2+ swelling of the prepatellar bursa with no warmth or erythema.  Imaging: DG PAIN CLINIC C-ARM 1-60 MIN NO REPORT  Result Date: 09/16/2019 Fluoro was used, but no Radiologist interpretation will be provided. Please refer to "NOTES" tab for provider progress note.   Assessment & Plan: 1.  Chronic recurrent left prepatellar bursitis -He will consult with Dr. Erlinda Hong for prepatellar bursectomy.  2.   Left lateral epicondylitis -Nitroglycerin patch therapy, home stretching and strengthening, Relafen as needed and Voltaren gel topically.  Injection or physical therapy if symptoms worsen.     Procedures: No procedures performed  No notes on file     PMFS History: Patient Active Problem List   Diagnosis Date Noted  . Mixed hyperlipidemia 09/02/2019  . Neurogenic pain 10/21/2018  . Preoperative testing 08/17/2018  . Grade 1 Lumbosacral Retrolisthesis of L5/S1 06/29/2018  . Lumbar facet hypertrophy (Bilateral) 06/29/2018  . Osteoarthritis of facet joint of lumbar spine 06/29/2018  . 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  . Lumbar spondylosis 06/29/2018  . DDD (degenerative disc disease), cervical 06/29/2018  . Cervicalgia 06/29/2018  . Abnormal MRI, lumbar spine 06/29/2018  . Long term prescription benzodiazepine use 06/29/2018  . Vitamin B12 deficiency 06/28/2018  . Vitamin D insufficiency 06/28/2018  . Spondylosis without myelopathy or radiculopathy, lumbosacral region 06/28/2018  . Chronic pain syndrome 06/03/2018  . Pharmacologic therapy 06/03/2018  . Long term current use of opiate analgesic 06/03/2018  . Disorder of skeletal system 06/03/2018  . Problems influencing health status 06/03/2018  . Chronic low back pain (Primary area of Pain) (Bilateral) (R=L) w/o sciatica 06/03/2018  . DDD (degenerative disc disease), lumbosacral 04/06/2018  . Lumbar facet syndrome (Bilateral) 04/06/2018  . Status post  lumbar spine surgery for decompression of spinal cord 05/21/2017  . History of cervical spinal surgery 05/21/2017  . Hypothyroidism 04/14/2017  . Chronic neck pain with history of cervical spinal surgery 09/06/2011  . Cervical radiculopathy at C6 09/06/2011  . JAUNDICE 05/21/2010  . HEPATITIS C 11/21/2009  . Anxiety and depression 11/21/2009  . PANIC ATTACK 11/21/2009  . Alcohol abuse 11/21/2009  . Tobacco abuse 11/21/2009  . HYPERTENSION, BENIGN ESSENTIAL  11/21/2009  . FATIGUE 11/21/2009   Past Medical History:  Diagnosis Date  . Acute hepatitis C without mention of hepatic coma(070.51)    has been cleared   . Acute postoperative pain 12/10/2018  . Alcohol abuse, unspecified   . Anxiety and depression   . Anxiety states   . Essential hypertension, benign   . GERD (gastroesophageal reflux disease)   . Graves disease    treated by Dr Loanne Drilling  . Panic disorder without agoraphobia   . Thyroid disease    Graves Disease  . Tobacco use disorder     Family History  Problem Relation Age of Onset  . Anxiety disorder Father   . Heart disease Father   . Hypertension Father   . Thyroid disease Mother   . Anxiety disorder Sister   . Hyperthyroidism Sister   . Colon cancer Neg Hx   . Esophageal cancer Neg Hx   . Pancreatic cancer Neg Hx     Past Surgical History:  Procedure Laterality Date  . ANTERIOR CERVICAL DECOMP/DISCECTOMY FUSION N/A 05/21/2017   Procedure: Anterior Cervical Decompression Fusion Cervical five-six, Cervical six-seven;  Surgeon: Eustace Moore, MD;  Location: [facility];  Service: Neurosurgery;  Laterality: N/A;  . MULTIPLE TOOTH EXTRACTIONS  15 yrs. ago  . WISDOM TOOTH EXTRACTION     Social History   Occupational History    Employer: [employer]  Tobacco Use  . Smoking status: Current Every Day Smoker    Packs/day: 1.00    Years: 30.00    Pack years: 30.00    Types: Cigarettes  . Smokeless tobacco: Current User    Types: Chew  Vaping Use  . Vaping Use: Never used  Substance and Sexual Activity  . Alcohol use: Yes    Alcohol/week: 42.0 standard drinks    Types: 42 Cans of beer per week    Comment: 6 pack of beer daily  . Drug use: Not Currently    Comment: hx of cocaine  last dose 7 yrs ago  . Sexual activity: Not on file

## 2019-09-17 NOTE — Telephone Encounter (Signed)
Post procedure phone call.  Patient states he is doing well.

## 2019-09-27 MED FILL — oxyCODONE HCL 5 MG TABS: 5 | 30 days supply | Qty: 120 | Fill #0

## 2019-09-28 ENCOUNTER — Ambulatory Visit: Payer: 59 | Admitting: Endocrinology

## 2019-09-29 ENCOUNTER — Ambulatory Visit: Payer: 59 | Admitting: Orthopaedic Surgery

## 2019-09-29 ENCOUNTER — Encounter: Payer: Self-pay | Admitting: Pain Medicine

## 2019-09-29 NOTE — Progress Notes (Signed)
Patient: Chad Wilson  Service Category: E/M  Provider: [name], MD  DOB: [birth year]  DOS: 09/30/2019  Location: Office  MRN: [id number]  Setting: Ambulatory outpatient  Referring Provider: Dorothyann Peng, NP  Type: Established Patient  Specialty: Interventional Pain Management  PCP: Dorothyann Peng, NP  Location: Remote location  Delivery: TeleHealth     Virtual Encounter - Pain Management PROVIDER NOTE: Information contained herein reflects review and annotations entered in association with encounter. Interpretation of such information and data should be left to medically-trained personnel. Information provided to patient can be located elsewhere in the medical record under "Patient Instructions". Document created using STT-dictation technology, any transcriptional errors that may result from process are unintentional.    Contact & Pharmacy Preferred: [phone] Home: [phone] (home) Mobile: [phone] (mobile) E-mail: [email or web address] .[place], [place] - [address] [facility]. [address] [place] [address] Phone: [phone] Fax: [phone]   Pre-screening  Chad Wilson offered "in-person" vs "virtual" encounter. He indicated preferring virtual for this encounter.   Reason COVID-19*  Social distancing based on CDC and AMA recommendations.   I contacted Chad Edgin on 09/30/2019 via telephone.      I clearly identified myself as [name], MD. I verified that I was speaking with the correct person using two identifiers (Name: Chad Wilson, and date of birth: 26-Aug-1975).  Consent I sought verbal advanced consent from Chad Palladino for virtual visit interactions. I informed Mr. Face of possible security and privacy concerns, risks, and limitations associated with providing "not-in-person" medical evaluation and management services. I also informed Mr. Liew of the availability of "in-person" appointments. Finally, I  informed him that there would be a charge for the virtual visit and that he could be  personally, fully or partially, financially responsible for it. Mr. Walder expressed understanding and agreed to proceed.   Historic Elements   Chad Wilson is a 44 y.o. year old, male patient evaluated today after his last contact with our practice on 09/17/2019. Mr. Cafiero  has a past medical history of Acute hepatitis C without mention of hepatic coma(070.51), Acute postoperative pain (12/10/2018), Alcohol abuse, unspecified, Anxiety and depression, Anxiety states, Essential hypertension, benign, GERD (gastroesophageal reflux disease), Graves disease, Panic disorder without agoraphobia, Thyroid disease, and Tobacco use disorder. He also  has a past surgical history that includes Multiple tooth extractions (15 yrs. ago); Anterior cervical decomp/discectomy fusion (N/A, 05/21/2017); and Wisdom tooth extraction. Chad Wilson has a current medication list which includes the following prescription(s): albuterol, alprazolam, atorvastatin, buspirone, colchicine, depo-testosterone, levothyroxine, lisinopril, nabumetone, nitroglycerin, oxycodone, [START ON 10/26/2019] oxycodone, pantoprazole, pregabalin, sertraline, sildenafil, and [DISCONTINUED] omeprazole. He  reports that he has been smoking cigarettes. He has a 30.00 pack-year smoking history. His smokeless tobacco use includes chew. He reports current alcohol use of about 42.0 standard drinks of alcohol per week. He reports previous drug use. Chad Wilson is allergic to chantix [varenicline].   HPI  Today, he is being contacted for a post-procedure assessment.  The patient again indicates having done really well with this procedure.  Today we talked to him about the possibility of doing radiofrequency ablation for the purpose of extending on the relief.  In fact, the patient already had this radiofrequency done last year.  The left side was done on 12/10/2018 and the right side on 01/05/2019.   This would mean that the left side lasted around 9 months while the right side lasted approximately 8 months.  The  fact that he again had excellent relief of the pain with this follow-up bilateral lumbar facet block would mean that that radiofrequency has worn off and he probably needs to have it repeated.  Post-Procedure Evaluation  Procedure: (09/16/2019) Therapeutic bilateral lumbar facet block #3 under fluoroscopic guidance and IV sedation Pre-procedure pain level: 7/10 Post-procedure: 0/10 (100% relief)  Sedation: Sedation provided.  Effectiveness during initial hour after procedure(Ultra-Short Term Relief): 100 %.  Local anesthetic used: Long-acting (4-6 hours) Effectiveness: Defined as any analgesic benefit obtained secondary to the administration of local anesthetics. This carries significant diagnostic value as to the etiological location, or anatomical origin, of the pain. Duration of benefit is expected to coincide with the duration of the local anesthetic used.  Effectiveness during initial 4-6 hours after procedure(Short-Term Relief): 100 %.  Long-term benefit: Defined as any relief past the pharmacologic duration of the local anesthetics.  Effectiveness past the initial 6 hours after procedure(Long-Term Relief): 100 % (states by the end of the day the pain is a little worse but is not as bad as before.  still taking pain medicine as needed.).  Current benefits: Defined as benefit that persist at this time.   Analgesia:  90-100% better Function: Chad Wilson reports improvement in function ROM: Chad Wilson reports improvement in ROM  Pharmacotherapy Assessment  Analgesic: Oxycodone IR 5 mg, 1 tab PO q 6 hrs (20 mg/day of oxycodone) MME/day: 30 mg/day.   Monitoring: [place] PMP: PDMP reviewed during this encounter.       Pharmacotherapy: No side-effects or adverse reactions reported. Compliance: No problems identified. Effectiveness: Clinically acceptable. Plan: Refer to "POC".  UDS:    Summary  Date Value Ref Range Status  08/23/2019 Note  Final    Comment:    ==================================================================== [employer] Select 13 (MW) ==================================================================== Test                             Result       Flag       Units  Drug Present and Declared for Prescription Verification   7-aminoclonazepam              36           EXPECTED   ng/mg creat    7-aminoclonazepam is an expected metabolite of clonazepam. Source of    clonazepam is a scheduled prescription medication.    Oxycodone                      427          EXPECTED   ng/mg creat   Oxymorphone                    665          EXPECTED   ng/mg creat   Noroxycodone                   1768         EXPECTED   ng/mg creat   Noroxymorphone                 429          EXPECTED   ng/mg creat    Sources of oxycodone are scheduled prescription medications.    Oxymorphone, noroxycodone, and noroxymorphone are expected    metabolites of oxycodone. Oxymorphone is also available as a    scheduled prescription medication.  Drug Present not  Declared for Prescription Verification   Alprazolam                     29           UNEXPECTED ng/mg creat   Alpha-hydroxyalprazolam        49           UNEXPECTED ng/mg creat    Source of alprazolam is a scheduled prescription medication. Alpha-    hydroxyalprazolam is an expected metabolite of alprazolam.  ==================================================================== Test                      Result    Flag   Units      Ref Range   Creatinine              146              mg/dL      >=20 ==================================================================== Declared Medications:  The flagging and interpretation on this report are based on the  following declared medications.  Unexpected results may arise from  inaccuracies in the declared medications.   **Note: The testing scope of this panel includes these  medications:   Clonazepam (Klonopin)  Oxycodone (Roxicodone)   **Note: The testing scope of this panel does not include the  following reported medications:   Albuterol (Ventolin HFA)  Atorvastatin (Lipitor)  Buspirone (Buspar)  Colchicine  Levothyroxine (Synthroid)  Lisinopril (Zestril)  Omeprazole (Prilosec)  Pantoprazole (Protonix)  Pregabalin (Lyrica)  Sertraline (Zoloft)  Sildenafil  Testosterone ==================================================================== For clinical consultation, please call [phone]. ====================================================================     Laboratory Chemistry Profile   Renal Lab Results  Component Value Date   BUN 13 09/02/2019   CREATININE 0.92 09/02/2019   BCR 8 (L) 06/03/2018   GFR 88.79 09/02/2019   GFRAA >60 10/05/2018   GFRNONAA >60 10/05/2018     Hepatic Lab Results  Component Value Date   AST 20 09/02/2019   ALT 43 09/02/2019   ALBUMIN 4.5 09/02/2019   ALKPHOS 83 09/02/2019   HCVAB (A) 09/29/2009    Reactive (NOTE) Result repeated and verified.                                                                       This test is for screening purposes only.  Reactive results should be confirmed by an alternative method.  Suggest HCV Qualitative, PCR, test code  83130.  Specimens will be stable for reflex testing up to 3 days after collection.   LIPASE 26 10/05/2018     Electrolytes Lab Results  Component Value Date   NA 139 09/02/2019   K 3.6 09/02/2019   CL 102 09/02/2019   CALCIUM 9.3 09/02/2019   MG 2.1 06/03/2018     Bone Lab Results  Component Value Date   VD25OH 21.01 (L) 02/17/2018   TESTOFREE See below 02/17/2018   TESTOSTERONE 147 (L) 02/17/2018     Inflammation (CRP: Acute Phase) (ESR: Chronic Phase) Lab Results  Component Value Date   CRP 1 06/03/2018   ESRSEDRATE 21 (H) 06/03/2018       Note: Above Lab results reviewed.   Imaging  DG PAIN CLINIC C-ARM 1-60 MIN NO  REPORT Fluoro  was used, but no Radiologist interpretation will be provided.  Please refer to "NOTES" tab for provider progress note.      Assessment  The primary encounter diagnosis was Chronic pain syndrome. Diagnoses of Lumbar facet syndrome (Bilateral), Lumbar facet hypertrophy (Bilateral), DDD (degenerative disc disease), lumbosacral, and Grade 1 Lumbosacral Retrolisthesis of L5/S1 were also pertinent to this visit.  Plan of Care  Problem-specific:  No problem-specific Assessment & Plan notes found for this encounter.  Mr. Chad Ducre has a current medication list which includes the following long-term medication(s): albuterol, atorvastatin, colchicine, depo-testosterone, levothyroxine, lisinopril, nitroglycerin, oxycodone, [START ON 10/26/2019] oxycodone, pantoprazole, pregabalin, sertraline, and [DISCONTINUED] omeprazole.  Pharmacotherapy (Medications Ordered): No orders of the defined types were placed in this encounter.  Orders:  No orders of the defined types were placed in this encounter.  Follow-up plan:   Return for regular appointment.      Considering:   Therapeutic bilateral lumbar facet RFA  (we will start on the left side) (scheduled for 12/10/2018)   PRN Procedures:   Palliative/therapeutic bilateral lumbar facet block #3  Palliative right-sided lumbar facet RFA #2 (last done 12/10/2018) (100/100/100/100) Palliative left-sided lumbar facet RFA #2 (last done 01/05/2019) (100/100/50/90-100)      Recent Visits Date Type Provider Dept  09/16/19 Procedure visit Milinda Pointer, MD [facility]  08/23/19 Office Visit Milinda Pointer, MD [facility]  Showing recent visits within past 90 days and meeting all other requirements Today's Visits Date Type Provider Dept  09/30/19 Telemedicine Milinda Pointer, MD [facility]  Showing today's visits and meeting all other requirements Future Appointments Date Type Provider Dept  11/17/19  Appointment Milinda Pointer, MD [facility]  Showing future appointments within next 90 days and meeting all other requirements  I discussed the assessment and treatment plan with the patient. The patient was provided an opportunity to ask questions and all were answered. The patient agreed with the plan and demonstrated an understanding of the instructions.  Patient advised to call back or seek an in-person evaluation if the symptoms or condition worsens.  Duration of encounter: 18 minutes.  Note by: [name], MD Date: 09/30/2019; Time: 4:11 PM

## 2019-09-30 ENCOUNTER — Ambulatory Visit: Payer: 59 | Attending: Pain Medicine | Admitting: Pain Medicine

## 2019-09-30 ENCOUNTER — Other Ambulatory Visit: Payer: Self-pay

## 2019-09-30 DIAGNOSIS — M5137 Other intervertebral disc degeneration, lumbosacral region: Secondary | ICD-10-CM

## 2019-09-30 DIAGNOSIS — G894 Chronic pain syndrome: Secondary | ICD-10-CM | POA: Diagnosis not present

## 2019-09-30 DIAGNOSIS — M4310 Spondylolisthesis, site unspecified: Secondary | ICD-10-CM

## 2019-09-30 DIAGNOSIS — M47816 Spondylosis without myelopathy or radiculopathy, lumbar region: Secondary | ICD-10-CM | POA: Diagnosis not present

## 2019-10-01 MED FILL — ALPRAZolam 0.5 MG TABS: 0.5 | 30 days supply | Qty: 60 | Fill #1

## 2019-10-08 ENCOUNTER — Other Ambulatory Visit: Payer: Self-pay | Admitting: Adult Health

## 2019-10-08 ENCOUNTER — Other Ambulatory Visit: Payer: Self-pay | Admitting: Endocrinology

## 2019-10-08 DIAGNOSIS — F419 Anxiety disorder, unspecified: Secondary | ICD-10-CM

## 2019-10-08 DIAGNOSIS — F329 Major depressive disorder, single episode, unspecified: Secondary | ICD-10-CM

## 2019-10-08 MED FILL — PANTOPRAZOLE SOD DR 40 MG T: 40 | 30 days supply | Qty: 30 | Fill #2

## 2019-10-08 MED FILL — SERTRALINE HCL 100 MG TAB: 100 | 90 days supply | Qty: 180 | Fill #0

## 2019-10-08 MED FILL — LEVOTHYROXINE SODIUM 150 MC: 150 | 30 days supply | Qty: 30 | Fill #0

## 2019-10-08 MED FILL — busPIRone HCL 5 MG TABS: 5 | 30 days supply | Qty: 60 | Fill #1

## 2019-10-08 NOTE — Telephone Encounter (Signed)
Sent to the pharmacy by e-scribe. 

## 2019-10-11 ENCOUNTER — Other Ambulatory Visit (HOSPITAL_COMMUNITY): Payer: Self-pay | Admitting: Urology

## 2019-10-11 MED FILL — SILDENAFIL CITRATE 20 MG TA: 20 | 90 days supply | Qty: 90 | Fill #0

## 2019-10-18 MED FILL — PREGABALIN 225 MG CAPS: 225 | 30 days supply | Qty: 60 | Fill #3

## 2019-10-20 ENCOUNTER — Ambulatory Visit: Payer: 59 | Admitting: Endocrinology

## 2019-10-20 MED FILL — TESTOSTERONE CYP 200 MG/ML: 200 | 28 days supply | Qty: 2 | Fill #0

## 2019-10-27 MED FILL — oxyCODONE HCL 5 MG TABS: 5 | 30 days supply | Qty: 120 | Fill #0

## 2019-11-05 ENCOUNTER — Other Ambulatory Visit: Payer: Self-pay

## 2019-11-05 ENCOUNTER — Other Ambulatory Visit: Payer: Self-pay | Admitting: Endocrinology

## 2019-11-05 ENCOUNTER — Ambulatory Visit (INDEPENDENT_AMBULATORY_CARE_PROVIDER_SITE_OTHER): Payer: 59 | Admitting: Endocrinology

## 2019-11-05 ENCOUNTER — Encounter: Payer: Self-pay | Admitting: Endocrinology

## 2019-11-05 VITALS — BP 144/86 | HR 79 | Ht 66.0 in | Wt 208.6 lb

## 2019-11-05 DIAGNOSIS — E890 Postprocedural hypothyroidism: Secondary | ICD-10-CM | POA: Diagnosis not present

## 2019-11-05 LAB — TSH: TSH: 5.26 u[IU]/mL — ABNORMAL HIGH (ref 0.35–4.50)

## 2019-11-05 MED ORDER — LEVOTHYROXINE SODIUM 175 MCG PO TABS: 175.0000 ug | ORAL_TABLET | Freq: Every day | ORAL | 2 refills | Status: DC

## 2019-11-05 NOTE — Progress Notes (Signed)
Subjective:    Patient ID: Chad Wilson, male    DOB: 11/13/1975, 44 y.o.   MRN: [id number]  HPI Pt returns for f/u of post-RAI hypothyroidism (due to [place]; dx'ed 2017; he took tapazole, but this was stopped for RAI, which he had in 2/18 and again in 10/18; he started Synthroid in Nov of 2018).  He takes synthroid as rx'ed--says he never misses.   Past Medical History:  Diagnosis Date   Acute hepatitis C without mention of hepatic coma(070.51)    has been cleared    Acute postoperative pain 12/10/2018   Alcohol abuse, unspecified    Anxiety and depression    Anxiety states    Essential hypertension, benign    GERD (gastroesophageal reflux disease)    Graves disease    treated by Dr Loanne Drilling   Panic disorder without agoraphobia    Thyroid disease    Graves Disease   Tobacco use disorder     Past Surgical History:  Procedure Laterality Date   ANTERIOR CERVICAL DECOMP/DISCECTOMY FUSION N/A 05/21/2017   Procedure: Anterior Cervical Decompression Fusion Cervical five-six, Cervical six-seven;  Surgeon: Eustace Moore, MD;  Location: [facility];  Service: Neurosurgery;  Laterality: N/A;   MULTIPLE TOOTH EXTRACTIONS  15 yrs. ago   WISDOM TOOTH EXTRACTION      Social History   Socioeconomic History   Marital status: Married    Spouse name: Not on file   Number of children: 0   Years of education: Not on file   Highest education level: Not on file  Occupational History    Employer: [employer]  Tobacco Use   Smoking status: Current Every Day Smoker    Packs/day: 1.00    Years: 30.00    Pack years: 30.00    Types: Cigarettes   Smokeless tobacco: Current User    Types: Chew  Vaping Use   Vaping Use: Never used  Substance and Sexual Activity   Alcohol use: Yes    Alcohol/week: 42.0 standard drinks    Types: 42 Cans of beer per week    Comment: 6 pack of beer daily   Drug use: Not Currently    Comment: hx of cocaine  last dose 7 yrs ago    Sexual activity: Not on file  Other Topics Concern   Not on file  Social History Narrative   He is working in [employer]    Diet: Fruits and veggies, water poweraid, occ sweet tea   Social Determinants of Radio broadcast assistant Strain:    Difficulty of Paying Living Expenses: Not on file  Food Insecurity:    Worried About Charity fundraiser in the Last Year: Not on file   [employer] of Food in the Last Year: Not on file  Transportation Needs:    Lack of Transportation (Medical): Not on file   Lack of Transportation (Non-Medical): Not on file  Physical Activity:    Days of Exercise per Week: Not on file   Minutes of Exercise per Session: Not on file  Stress:    Feeling of Stress : Not on file  Social Connections:    Frequency of Communication with Friends and Family: Not on file   Frequency of Social Gatherings with Friends and Family: Not on file   Attends Religious Services: Not on file   Active Member of Clubs or Organizations: Not on file   Attends Archivist Meetings: Not on file   Marital Status:  Not on file  Intimate Partner Violence:    Fear of Current or Ex-Partner: Not on file   Emotionally Abused: Not on file   Physically Abused: Not on file   Sexually Abused: Not on file    Current Outpatient Medications on File Prior to Visit  Medication Sig Dispense Refill   albuterol (VENTOLIN HFA) 108 (90 Base) MCG/ACT inhaler INHALE 2 PUFFS BY MOUTH EVERY 6 HOURS AS NEEDED. **DUE FOR YEARLY PHYSICAL** 8.5 g 0   atorvastatin (LIPITOR) 40 MG tablet Take 1 tablet (40 mg total) by mouth at bedtime. 90 tablet 3   busPIRone (BUSPAR) 5 MG tablet TAKE 1 TABLET BY MOUTH 2 TIMES DAILY. 60 tablet 1   Colchicine 0.6 MG CAPS Take 1 tablet by mouth 2 (two) times daily as needed. 60 capsule 3   DEPO-TESTOSTERONE 200 MG/ML injection Take 200 mg by mouth every 14 (fourteen) days.     lisinopril (ZESTRIL) 40 MG tablet TAKE 1 TABLET BY MOUTH DAILY 90  tablet 1   nabumetone (RELAFEN) 750 MG tablet Take 1 tablet (750 mg total) by mouth 2 (two) times daily as needed. 60 tablet 6   nitroGLYCERIN (NITRO-DUR) 0.1 mg/hr patch Apply 1/4 patch to affected area 12 hours daily 30 patch 3   oxyCODONE (OXY IR/ROXICODONE) 5 MG immediate release tablet Take 1 tablet (5 mg total) by mouth every 6 (six) hours as needed for severe pain. Must last 30 days 120 tablet 0   pantoprazole (PROTONIX) 40 MG tablet TAKE 1 TABLET (40 MG TOTAL) BY MOUTH DAILY. 30 tablet 6   pregabalin (LYRICA) 225 MG capsule Take 1 capsule (225 mg total) by mouth 2 (two) times daily. 60 capsule 5   sertraline (ZOLOFT) 100 MG tablet TAKE 2 TABLETS (200 MG TOTAL) BY MOUTH DAILY. 180 tablet 1   sildenafil (REVATIO) 20 MG tablet      oxyCODONE (OXY IR/ROXICODONE) 5 MG immediate release tablet Take 1 tablet (5 mg total) by mouth every 6 (six) hours as needed for severe pain. Must last 30 days 120 tablet 0   [DISCONTINUED] omeprazole (PRILOSEC) 20 MG capsule Take 1 capsule (20 mg total) by mouth daily. 30 capsule 3   No current facility-administered medications on file prior to visit.    Allergies  Allergen Reactions   Chantix [Varenicline] Other (See Comments)    Agitation and sleep walking     Family History  Problem Relation Age of Onset   Anxiety disorder Father    Heart disease Father    Hypertension Father    Thyroid disease Mother    Anxiety disorder Sister    Hyperthyroidism Sister    Colon cancer Neg Hx    Esophageal cancer Neg Hx    Pancreatic cancer Neg Hx     BP (!) 144/86    Pulse 79    Ht 5' 6"  (1.676 m)    Wt 208 lb 9.6 oz (94.6 kg)    SpO2 93%    BMI 33.67 kg/m    Review of Systems Denies weight change    Objective:   Physical Exam VITAL SIGNS:  See vs page.  GENERAL: no distress.  NECK: There is no palpable thyroid enlargement.  No thyroid nodule is palpable.  No palpable lymphadenopathy at the anterior neck.     Lab Results   Component Value Date   TSH 5.26 (H) 11/05/2019      Assessment & Plan:  Hypothyroidism: uncontrolled.  I have sent a prescription to your pharmacy,  to increase synthroid.   HTN: is noted today.     Patient Instructions  Your blood pressure is high today.  Please see your primary care provider soon, to have it rechecked Blood tests are requested for you today.  We'll let you know about the results.  It is best to never miss the medication.  However, if you do miss it, next best is to double up the next time.  Please come back for a follow-up appointment in 6 months.

## 2019-11-05 NOTE — Patient Instructions (Addendum)
Your blood pressure is high today.  Please see your primary care provider soon, to have it rechecked Blood tests are requested for you today.  We'll let you know about the results.  It is best to never miss the medication.  However, if you do miss it, next best is to double up the next time.  Please come back for a follow-up appointment in 6 months.

## 2019-11-06 MED FILL — LEVOTHYROXINE 175 MCG TABLE: 175 | 90 days supply | Qty: 90 | Fill #0

## 2019-11-08 MED FILL — ALPRAZolam 0.5 MG TABS: 0.5 | 30 days supply | Qty: 60 | Fill #2

## 2019-11-09 ENCOUNTER — Other Ambulatory Visit: Payer: Self-pay | Admitting: Adult Health

## 2019-11-09 DIAGNOSIS — I10 Essential (primary) hypertension: Secondary | ICD-10-CM

## 2019-11-10 ENCOUNTER — Other Ambulatory Visit: Payer: Self-pay | Admitting: Adult Health

## 2019-11-10 MED FILL — LISINOPRIL 40 MG TABS: 40 | 90 days supply | Qty: 90 | Fill #0

## 2019-11-17 ENCOUNTER — Ambulatory Visit: Payer: 59 | Admitting: Pain Medicine

## 2019-11-23 ENCOUNTER — Other Ambulatory Visit: Payer: Self-pay | Admitting: Pain Medicine

## 2019-11-23 DIAGNOSIS — M792 Neuralgia and neuritis, unspecified: Secondary | ICD-10-CM

## 2019-11-23 MED FILL — PANTOPRAZOLE SOD DR 40 MG T: 40 | 30 days supply | Qty: 30 | Fill #3

## 2019-11-30 NOTE — Progress Notes (Signed)
PROVIDER NOTE: Information contained herein reflects review and annotations entered in association with encounter. Interpretation of such information and data should be left to medically-trained personnel. Information provided to patient can be located elsewhere in the medical record under "Patient Instructions". Document created using STT-dictation technology, any transcriptional errors that may result from process are unintentional.    Patient: Chad Wilson  Service Category: E/M  Provider: [name], MD  DOB: 1975-08-29  DOS: 12/01/2019  Specialty: Interventional Pain Management  MRN: [id number]  Setting: Ambulatory outpatient  PCP: Chad Peng, NP  Type: Established Patient    Referring Provider: Dorothyann Peng, NP  Location: Office  Delivery: Face-to-face     HPI  Reason for encounter: [name], a 44 y.o. year old male, is here today for evaluation and management of his Chronic pain syndrome [G89.4]. [name] primary complain today is Back Pain Last encounter: Practice (11/23/2019). My last encounter with him was on 11/23/2019. Pertinent problems: [name] has Chronic neck pain with history of cervical spinal surgery; Cervical radiculopathy at C6; Status post lumbar spine surgery for decompression of spinal cord; History of cervical spinal surgery; DDD (degenerative disc disease), lumbosacral; Lumbar facet syndrome (Bilateral); Chronic pain syndrome; Chronic low back pain (Primary area of Pain) (Bilateral) (R=L) w/o sciatica; Spondylosis without myelopathy or radiculopathy, lumbosacral region; Grade 1 Lumbosacral Retrolisthesis of L5/S1; Lumbar facet hypertrophy (Bilateral); Osteoarthritis of facet joint of lumbar spine; Osteoarthritis of lumbar spine; Lumbar spondylosis; DDD (degenerative disc disease), cervical; Cervicalgia; Abnormal MRI, lumbar spine; and Neurogenic pain on their pertinent problem list. Pain Assessment: Severity of Chronic pain is reported as a 3 /10. Location: Back  Lower/Denies. Onset: More than a month ago. Quality: Aching, Burning, Constant. Timing: Constant. Modifying factor(s): sitting and meds. Vitals:  height is 5' 6"  (1.676 m) and weight is 205 lb (93 kg). His temperature is 97.9 F (36.6 C). His blood pressure is 143/89 (abnormal) and his pulse is 83. His oxygen saturation is 98%.   Today the patient returns to the clinic for follow-up evaluation and medication management.  He is currently on oxycodone IR 5 mg, 1 tablet p.o. every 6 hours (20 mg/day of oxycodone) (30 MME).  The patient indicates doing well with the current medication regimen. No adverse reactions or side effects reported to the medications.  Unfortunately, there is a Producer, television/film/video on the oxycodone and today the patient has been informed and warned about it.  He has been given some options and he has decided to switch to hydrocodone/APAP 7.5/325, 1 tablet p.o. every 6 hours (30 mg/day of hydrocodone) (30 MME).  Today we have provided him with enough prescription refills to last until 02/29/2020 at which time we will see him back for follow-up evaluation.  In addition to this, the patient refers that he still having the pain in the lower back.  The patient already had 2 diagnostic bilateral lumbar facet blocks, one done on 08/25/2018 and the other one done on 09/22/2018, both of which provided him with 100% relief of the pain for the duration of the local anesthetic.  Subsequently we went on to do radiofrequency ablation of the lumbar facets.  He had the left side done on 12/10/2018 in the right side on 01/05/2019.  This provided him with excellent relief of the pain until July of this year when he started again experiencing some pain.  At that point, we check to see if the radiofrequency has had worn off by doing a diagnostic lumbar facet  block on 09/16/2019.  Follow-up on 09/30/2019 demonstrated that he again had attained 100% relief of the pain for the duration of the local anesthetic which in fact  persisted until recently when it started coming back again.  Today we spoke about repeating the radiofrequency and he is interested in doing that since he attained almost 1 year of near complete relief of pain.  For all of this, he has continued to use his pain medication sporadically, but my goal is to hopefully get him off of it.  Unfortunately, the patient did not bring his medications to the clinic today to be counted.  He was reminded that he must do so.  Other than that, he is PMP and UDS compliant.  Today we will transfer: Pregabalin (Lyrica) 225 mg capsule, 1 capsule p.o. twice daily (60/month) (02/29/2020)  Medical Necessity: [name] has experienced debilitating chronic pain from the Lumbosacral Facet Syndrome (Spondylosis without myelopathy or radiculopathy, lumbosacral region [M47.817]) that has persisted for longer than three months of failed non-surgical care and has either failed to respond, or was unable to tolerate, or simply did not get enough benefit from other more conservative therapies including, but not limited to: 1. Over-the-counter oral analgesic medications (i.e.: ibuprofen, naproxen, etc.) 2. Anti-inflammatory medications 3. Muscle relaxants 4. Membrane stabilizers 5. Opioids 6. Physical therapy (PT), chiropractic manipulation, and/or home exercise program (HEP). 7. Modalities (Heat, ice, etc.) 8. Invasive techniques such as nerve blocks.  [name] has attained greater than 100% reduction in pain from at least two (2) diagnostic medial branch blocks conducted in separate occasions.  In addition, he has already had prior radiofrequency ablations on both sides of the lumbar spine with excellent benefits that lasted a year.  For this reason, I believe it is medically necessary to proceed with Non-Pulsed Radiofrequency Ablation for the purpose of attempting to prolong the duration of the benefits seen with the diagnostic injections.  Pharmacotherapy Assessment   Analgesic:  Oxycodone IR 5 mg, 1 tab PO q 6 hrs (20 mg/day of oxycodone) MME/day: 30 mg/day.   Monitoring: Chad Wilson PMP: PDMP reviewed during this encounter.       Pharmacotherapy: No side-effects or adverse reactions reported. Compliance: No problems identified. Effectiveness: Clinically acceptable.  Chauncey Fischer, RN  12/01/2019  8:19 AM  Sign when Signing Visit Nursing Pain Medication Assessment:  Safety precautions to be maintained throughout the outpatient stay will include: orient to surroundings, keep bed in low position, maintain call bell within reach at all times, provide assistance with transfer out of bed and ambulation.  Medication Inspection Compliance: Mr. Pitre did not comply with our request to bring his pills to be counted. He was reminded that bringing the medication bottles, even when empty, is a requirement.  Medication: None brought in. Pill/Patch Count: None available to be counted. Bottle Appearance: No container available. Did not bring bottle(s) to appointment. Filled Date: N/A Last Medication intake:  YesterdaySafety precautions to be maintained throughout the outpatient stay will include: orient to surroundings, keep bed in low position, maintain call bell within reach at all times, provide assistance with transfer out of bed and ambulation.     UDS:  Summary  Date Value Ref Range Status  08/23/2019 Note  Final    Comment:    ==================================================================== [employer] [facility] 13 (MW) ==================================================================== Test                             Result  Flag       Units  Drug Present and Declared for Prescription Verification   7-aminoclonazepam              36           EXPECTED   ng/mg creat    7-aminoclonazepam is an expected metabolite of clonazepam. Source of    clonazepam is a scheduled prescription medication.    Oxycodone                      427          EXPECTED   ng/mg creat    Oxymorphone                    665          EXPECTED   ng/mg creat   Noroxycodone                   1768         EXPECTED   ng/mg creat   Noroxymorphone                 429          EXPECTED   ng/mg creat    Sources of oxycodone are scheduled prescription medications.    Oxymorphone, noroxycodone, and noroxymorphone are expected    metabolites of oxycodone. Oxymorphone is also available as a    scheduled prescription medication.  Drug Present not Declared for Prescription Verification   Alprazolam                     29           UNEXPECTED ng/mg creat   Alpha-hydroxyalprazolam        49           UNEXPECTED ng/mg creat    Source of alprazolam is a scheduled prescription medication. Alpha-    hydroxyalprazolam is an expected metabolite of alprazolam.  ==================================================================== Test                      Result    Flag   Units      Ref Range   Creatinine              146              mg/dL      >=20 ==================================================================== Declared Medications:  The flagging and interpretation on this report are based on the  following declared medications.  Unexpected results may arise from  inaccuracies in the declared medications.   **Note: The testing scope of this panel includes these medications:   Clonazepam (Klonopin)  Oxycodone (Roxicodone)   **Note: The testing scope of this panel does not include the  following reported medications:   Albuterol (Ventolin HFA)  Atorvastatin (Lipitor)  Buspirone (Buspar)  Colchicine  Levothyroxine (Synthroid)  Lisinopril (Zestril)  Omeprazole (Prilosec)  Pantoprazole (Protonix)  Pregabalin (Lyrica)  Sertraline (Zoloft)  Sildenafil  Testosterone ==================================================================== For clinical consultation, please call [phone]. ====================================================================      ROS   Constitutional: Denies any fever or chills Gastrointestinal: No reported hemesis, hematochezia, vomiting, or acute GI distress Musculoskeletal: Denies any acute onset joint swelling, redness, loss of ROM, or weakness Neurological: No reported episodes of acute onset apraxia, aphasia, dysarthria, agnosia, amnesia, paralysis, loss of coordination, or loss of consciousness  Medication Review  Colchicine, HYDROcodone-acetaminophen, albuterol, atorvastatin, busPIRone, levothyroxine, lisinopril, nabumetone,  nitroGLYCERIN, omeprazole, pantoprazole, pregabalin, sertraline, sildenafil, and testosterone cypionate  History Review  Allergy: Mr. Schillaci is allergic to chantix [varenicline]. Drug: Mr. Primm  reports previous drug use. Alcohol:  reports current alcohol use of about 42.0 standard drinks of alcohol per week. Tobacco:  reports that he has been smoking cigarettes. He has a 30.00 pack-year smoking history. His smokeless tobacco use includes chew. Social: Mr. Duvall  reports that he has been smoking cigarettes. He has a 30.00 pack-year smoking history. His smokeless tobacco use includes chew. He reports current alcohol use of about 42.0 standard drinks of alcohol per week. He reports previous drug use. Medical:  has a past medical history of Acute hepatitis C without mention of hepatic coma(070.51), Acute postoperative pain (12/10/2018), Alcohol abuse, unspecified, Anxiety and depression, Anxiety states, Essential hypertension, benign, GERD (gastroesophageal reflux disease), Graves disease, Panic disorder without agoraphobia, Thyroid disease, and Tobacco use disorder. Surgical: Mr. Hu  has a past surgical history that includes Multiple tooth extractions (15 yrs. ago); Anterior cervical decomp/discectomy fusion (N/A, 05/21/2017); and Wisdom tooth extraction. Family: family history includes Anxiety disorder in his father and sister; Heart disease in his father; Hypertension in his father; Hyperthyroidism in his  sister; Thyroid disease in his mother.  Laboratory Chemistry Profile   Renal Lab Results  Component Value Date   BUN 13 09/02/2019   CREATININE 0.92 09/02/2019   BCR 8 (L) 06/03/2018   GFR 88.79 09/02/2019   GFRAA >60 10/05/2018   GFRNONAA >60 10/05/2018     Hepatic Lab Results  Component Value Date   AST 20 09/02/2019   ALT 43 09/02/2019   ALBUMIN 4.5 09/02/2019   ALKPHOS 83 09/02/2019   HCVAB (A) 09/29/2009    Reactive (NOTE) Result repeated and verified.                                                                       This test is for screening purposes only.  Reactive results should be confirmed by an alternative method.  Suggest HCV Qualitative, PCR, test code  83130.  Specimens will be stable for reflex testing up to 3 days after collection.   LIPASE 26 10/05/2018     Electrolytes Lab Results  Component Value Date   NA 139 09/02/2019   K 3.6 09/02/2019   CL 102 09/02/2019   CALCIUM 9.3 09/02/2019   MG 2.1 06/03/2018     Bone Lab Results  Component Value Date   VD25OH 21.01 (L) 02/17/2018   TESTOFREE See below 02/17/2018   TESTOSTERONE 147 (L) 02/17/2018     Inflammation (CRP: Acute Phase) (ESR: Chronic Phase) Lab Results  Component Value Date   CRP 1 06/03/2018   ESRSEDRATE 21 (H) 06/03/2018       Note: Above Lab results reviewed.  Recent Imaging Review  DG PAIN CLINIC C-ARM 1-60 MIN NO REPORT Fluoro was used, but no Radiologist interpretation will be provided.  Please refer to "NOTES" tab for provider progress note. Note: Reviewed        Physical Exam  General appearance: Well nourished, well developed, and well hydrated. In no apparent acute distress Mental status: Alert, oriented x 3 (person, place, & time)       Respiratory: No evidence of  acute respiratory distress Eyes: PERLA Vitals: BP (!) 143/89   Pulse 83   Temp 97.9 F (36.6 C)   Ht 5' 6"  (1.676 m)   Wt 205 lb (93 kg)   SpO2 98%   BMI 33.09 kg/m  BMI: Estimated body mass  index is 33.09 kg/m as calculated from the following:   Height as of this encounter: 5' 6"  (1.676 m).   Weight as of this encounter: 205 lb (93 kg). Ideal: Ideal body weight: 63.8 kg (140 lb 10.5 oz) Adjusted ideal body weight: 75.5 kg (166 lb 6.3 oz)  Assessment   Status Diagnosis  Controlled Controlled Controlled 1. Chronic pain syndrome   2. Lumbar facet syndrome (Bilateral)   3. Lumbar facet hypertrophy (Bilateral)   4. Neurogenic pain      Updated Problems: No problems updated.  Plan of Care  Problem-specific:  No problem-specific Assessment & Plan notes found for this encounter.  Mr. Chad Sweis has a current medication list which includes the following long-term medication(s): albuterol, atorvastatin, colchicine, depo-testosterone, levothyroxine, lisinopril, nitroglycerin, pantoprazole, sertraline, pregabalin, and [DISCONTINUED] omeprazole.  Pharmacotherapy (Medications Ordered): Meds ordered this encounter  Medications  . HYDROcodone-acetaminophen (NORCO) 7.5-325 MG tablet    Sig: Take 1 tablet by mouth every 6 (six) hours as needed for severe pain. Must last 30 days.    Dispense:  120 tablet    Refill:  0    Chronic Pain: STOP Act (Not applicable) Fill 1 day early if closed on refill date. Avoid benzodiazepines within 8 hours of opioids  . HYDROcodone-acetaminophen (NORCO) 7.5-325 MG tablet    Sig: Take 1 tablet by mouth every 6 (six) hours as needed for severe pain. Must last 30 days.    Dispense:  120 tablet    Refill:  0    Chronic Pain: STOP Act (Not applicable) Fill 1 day early if closed on refill date. Avoid benzodiazepines within 8 hours of opioids  . HYDROcodone-acetaminophen (NORCO) 7.5-325 MG tablet    Sig: Take 1 tablet by mouth every 6 (six) hours as needed for severe pain. Must last 30 days.    Dispense:  120 tablet    Refill:  0    Chronic Pain: STOP Act (Not applicable) Fill 1 day early if closed on refill date. Avoid benzodiazepines within 8 hours of  opioids  . pregabalin (LYRICA) 225 MG capsule    Sig: Take 1 capsule (225 mg total) by mouth 2 (two) times daily.    Dispense:  60 capsule    Refill:  2    Fill one day early if pharmacy is closed on scheduled refill date. May substitute for generic if available.   Orders:  Orders Placed This Encounter  Procedures  . Radiofrequency,Lumbar    Standing Status:   Future    Standing Expiration Date:   11/30/2020    Scheduling Instructions:     Side(s): Right-sided     Level: L3-4, L4-5, & L5-S1 Facets (L2, L3, L4, L5, & S1 Medial Branch Nerves)     Sedation: With Sedation.     Scheduling Timeframe: As soon as pre-approved    Order Specific Question:   Where will this procedure be performed?    Answer:   [facility]   Follow-up plan:   Return in about 3 months (around 02/29/2020) for (F2F), (Med Mgmt), in addition, Radio-Frequency: (R) L-FCT #2.      Considering:   Therapeutic bilateral lumbar facet RFA #2  (we will start on the  right side)   PRN Procedures:   Palliative/therapeutic bilateral lumbar facet block #4  Palliative right lumbar facet RFA #2 (last done 12/10/2018) (100/100/100/100) Palliative left lumbar facet RFA #2 (last done 01/05/2019) (100/100/50/90-100)       Recent Visits Date Type Provider Dept  09/30/19 Telemedicine Milinda Pointer, MD [facility]  09/16/19 Procedure visit Milinda Pointer, MD [facility]  Showing recent visits within past 90 days and meeting all other requirements Today's Visits Date Type Provider Dept  12/01/19 Office Visit Milinda Pointer, MD [facility]  Showing today's visits and meeting all other requirements Future Appointments Date Type Provider Dept  02/23/20 Appointment Milinda Pointer, MD [facility]  Showing future appointments within next 90 days and meeting all other requirements  I discussed the assessment and treatment plan with the patient. The patient was provided  an opportunity to ask questions and all were answered. The patient agreed with the plan and demonstrated an understanding of the instructions.  Patient advised to call back or seek an in-person evaluation if the symptoms or condition worsens.  Duration of encounter: 35 minutes.  Note by: [name], MD Date: 12/01/2019; Time: 9:18 AM

## 2019-12-01 ENCOUNTER — Other Ambulatory Visit: Payer: Self-pay

## 2019-12-01 ENCOUNTER — Encounter: Payer: Self-pay | Admitting: Pain Medicine

## 2019-12-01 ENCOUNTER — Other Ambulatory Visit: Payer: Self-pay | Admitting: Pain Medicine

## 2019-12-01 ENCOUNTER — Ambulatory Visit: Payer: 59 | Attending: Pain Medicine | Admitting: Pain Medicine

## 2019-12-01 VITALS — BP 143/89 | HR 83 | Temp 97.9°F | Ht 66.0 in | Wt 205.0 lb

## 2019-12-01 DIAGNOSIS — M47816 Spondylosis without myelopathy or radiculopathy, lumbar region: Secondary | ICD-10-CM | POA: Diagnosis not present

## 2019-12-01 DIAGNOSIS — G894 Chronic pain syndrome: Secondary | ICD-10-CM | POA: Diagnosis not present

## 2019-12-01 DIAGNOSIS — M792 Neuralgia and neuritis, unspecified: Secondary | ICD-10-CM | POA: Diagnosis not present

## 2019-12-01 MED ORDER — HYDROCODONE-ACETAMINOPHEN 7.5-325 MG PO TABS: 1.0000 | ORAL_TABLET | Freq: Four times a day (QID) | ORAL | 0 refills | Status: DC | PRN

## 2019-12-01 MED ORDER — PREGABALIN 225 MG PO CAPS: 225.0000 mg | ORAL_CAPSULE | Freq: Two times a day (BID) | ORAL | 2 refills | Status: DC

## 2019-12-01 MED FILL — HYDROCODON-APAP 7.5-325: 7.5-325 | 30 days supply | Qty: 120 | Fill #0

## 2019-12-01 MED FILL — PREGABALIN 225 MG CAPS: 225 | 30 days supply | Qty: 60 | Fill #0

## 2019-12-01 NOTE — Patient Instructions (Addendum)
____________________________________________________________________________________________  Medication Rules  Purpose: To inform patients, and their family members, of our rules and regulations.  Applies to: All patients receiving prescriptions (written or electronic).  Pharmacy of record: Pharmacy where electronic prescriptions will be sent. If written prescriptions are taken to a different pharmacy, please inform the nursing staff. The pharmacy listed in the electronic medical record should be the one where you would like electronic prescriptions to be sent.  Electronic prescriptions: In compliance with the [place] (STOP) Act of 2017 (Session Lanny Cramp [phone]), effective March 18, 2018, all controlled substances must be electronically prescribed. Calling prescriptions to the pharmacy will cease to exist.  Prescription refills: Only during scheduled appointments. Applies to all prescriptions.  NOTE: The following applies primarily to controlled substances (Opioid* Pain Medications).   Type of encounter (visit): For patients receiving controlled substances, face-to-face visits are required. (Not an option or up to the patient.)  Patient's responsibilities: 1. Pain Pills: Bring all pain pills to every appointment (except for procedure appointments). 2. Pill Bottles: Bring pills in original pharmacy bottle. Always bring the newest bottle. Bring bottle, even if empty. 3. Medication refills: You are responsible for knowing and keeping track of what medications you take and those you need refilled. The day before your appointment: write a list of all prescriptions that need to be refilled. The day of the appointment: give the list to the admitting nurse. Prescriptions will be written only during appointments. No prescriptions will be written on procedure days. If you forget a medication: it will not be "Called in", "Faxed", or "electronically sent".  You will need to get another appointment to get these prescribed. No early refills. Do not call asking to have your prescription filled early. 4. Prescription Accuracy: You are responsible for carefully inspecting your prescriptions before leaving our office. Have the discharge nurse carefully go over each prescription with you, before taking them home. Make sure that your name is accurately spelled, that your address is correct. Check the name and dose of your medication to make sure it is accurate. Check the number of pills, and the written instructions to make sure they are clear and accurate. Make sure that you are given enough medication to last until your next medication refill appointment. 5. Taking Medication: Take medication as prescribed. When it comes to controlled substances, taking less pills or less frequently than prescribed is permitted and encouraged. Never take more pills than instructed. Never take medication more frequently than prescribed.  6. Inform other Doctors: Always inform, all of your healthcare providers, of all the medications you take. 7. Pain Medication from other Providers: You are not allowed to accept any additional pain medication from any other Doctor or Healthcare provider. There are two exceptions to this rule. (see below) In the event that you require additional pain medication, you are responsible for notifying us, as stated below. 8. Medication Agreement: You are responsible for carefully reading and following our Medication Agreement. This must be signed before receiving any prescriptions from our practice. Safely store a copy of your signed Agreement. Violations to the Agreement will result in no further prescriptions. (Additional copies of our Medication Agreement are available upon request.) 9. Laws, Rules, & Regulations: All patients are expected to follow all Federal and [employer], [employer], Rules, [employer]. Ignorance of the Laws does not constitute a  valid excuse.  10. Illegal drugs and Controlled Substances: The use of illegal substances (including, but not limited to marijuana and its  derivatives) and/or the illegal use of any controlled substances is strictly prohibited. Violation of this rule may result in the immediate and permanent discontinuation of any and all prescriptions being written by our practice. The use of any illegal substances is prohibited. 11. Adopted CDC guidelines & recommendations: Target dosing levels will be at or below 60 MME/day. Use of benzodiazepines** is not recommended.  Exceptions: There are only two exceptions to the rule of not receiving pain medications from other Healthcare Providers. 1. Exception #1 (Emergencies): In the event of an emergency (i.e.: accident requiring emergency care), you are allowed to receive additional pain medication. However, you are responsible for: As soon as you are able, call our office [phone], at any time of the day or night, and leave a message stating your name, the date and nature of the emergency, and the name and dose of the medication prescribed. In the event that your call is answered by a member of our staff, make sure to document and save the date, time, and the name of the person that took your information.  2. Exception #2 (Planned Surgery): In the event that you are scheduled by another doctor or dentist to have any type of surgery or procedure, you are allowed (for a period no longer than 30 days), to receive additional pain medication, for the acute post-op pain. However, in this case, you are responsible for picking up a copy of our "Post-op Pain Management for Surgeons" handout, and giving it to your surgeon or dentist. This document is available at our office, and does not require an appointment to obtain it. Simply go to our office during business hours (Monday-Thursday from 8:00 AM to 4:00 PM) (Friday 8:00 AM to 12:00 Noon) or if you have a scheduled appointment  with [place], prior to your surgery, and ask for it by name. In addition, you are responsible for: calling our office [phone], at any time of the day or night, and leaving a message stating your name, name of your surgeon, type of surgery, and date of procedure or surgery. Failure to comply with your responsibilities may result in termination of therapy involving the controlled substances.  *Opioid medications include: morphine, codeine, oxycodone, oxymorphone, hydrocodone, hydromorphone, meperidine, tramadol, tapentadol, buprenorphine, fentanyl, methadone. **Benzodiazepine medications include: diazepam (Valium), alprazolam (Xanax), clonazepam (Klonopine), lorazepam (Ativan), clorazepate (Tranxene), chlordiazepoxide (Librium), estazolam (Prosom), oxazepam (Serax), temazepam (Restoril), triazolam (Halcion) (Last updated: 11/23/2019) ____________________________________________________________________________________________   ____________________________________________________________________________________________  Medication Recommendations and Reminders  Applies to: All patients receiving prescriptions (written and/or electronic).  Medication Rules & Regulations: These rules and regulations exist for your safety and that of others. They are not flexible and neither are we. Dismissing or ignoring them will be considered "non-compliance" with medication therapy, resulting in complete and irreversible termination of such therapy. (See document titled "Medication Rules" for more details.) In all conscience, because of safety reasons, we cannot continue providing a therapy where the patient does not follow instructions.  Pharmacy of record:   Definition: This is the pharmacy where your electronic prescriptions will be sent.   We do not endorse any particular pharmacy, however, we have experienced problems with [facility] not securing enough medication supply for the community.  We do not  restrict you in your choice of pharmacy. However, once we write for your prescriptions, we will NOT be re-sending more prescriptions to fix restricted supply problems created by your pharmacy, or your insurance.   The pharmacy listed in the electronic medical record should be the  one where you want electronic prescriptions to be sent.  If you choose to change pharmacy, simply notify our nursing staff.  Recommendations:  Keep all of your pain medications in a safe place, under lock and key, even if you live alone. We will NOT replace lost, stolen, or damaged medication.  After you fill your prescription, take 1 week's worth of pills and put them away in a safe place. You should keep a separate, properly labeled bottle for this purpose. The remainder should be kept in the original bottle. Use this as your primary supply, until it runs out. Once it's gone, then you know that you have 1 week's worth of medicine, and it is time to come in for a prescription refill. If you do this correctly, it is unlikely that you will ever run out of medicine.  To make sure that the above recommendation works, it is very important that you make sure your medication refill appointments are scheduled at least 1 week before you run out of medicine. To do this in an effective manner, make sure that you do not leave the office without scheduling your next medication management appointment. Always ask the nursing staff to show you in your prescription , when your medication will be running out. Then arrange for the receptionist to get you a return appointment, at least 7 days before you run out of medicine. Do not wait until you have 1 or 2 pills left, to come in. This is very poor planning and does not take into consideration that we may need to cancel appointments due to bad weather, sickness, or emergencies affecting our staff.  DO NOT ACCEPT A "Partial Fill": If for any reason your pharmacy does not have enough pills/tablets  to completely fill or refill your prescription, do not allow for a "partial fill". The law allows the pharmacy to complete that prescription within 72 hours, without requiring a new prescription. If they do not fill the rest of your prescription within those 72 hours, you will need a separate prescription to fill the remaining amount, which we will NOT provide. If the reason for the partial fill is your insurance, you will need to talk to the pharmacist about payment alternatives for the remaining tablets, but again, DO NOT ACCEPT A PARTIAL FILL, unless you can trust your pharmacist to obtain the remainder of the pills within 72 hours.  Prescription refills and/or changes in medication(s):   Prescription refills, and/or changes in dose or medication, will be conducted only during scheduled medication management appointments. (Applies to both, written and electronic prescriptions.)  No refills on procedure days. No medication will be changed or started on procedure days. No changes, adjustments, and/or refills will be conducted on a procedure day. Doing so will interfere with the diagnostic portion of the procedure.  No phone refills. No medications will be "called into the pharmacy".  No Fax refills.  No weekend refills.  No Holliday refills.  No after hours refills.  Remember:  Business hours are:  Monday to Thursday 8:00 AM to 4:00 PM Provider's Schedule: Milinda Pointer, MD - Appointments are:  Medication management: Monday and Wednesday 8:00 AM to 4:00 PM Procedure day: Tuesday and Thursday 7:30 AM to 4:00 PM Gillis Santa, MD - Appointments are:  Medication management: Tuesday and Thursday 8:00 AM to 4:00 PM Procedure day: Monday and Wednesday 7:30 AM to 4:00 PM (Last update: 10/06/2019) ____________________________________________________________________________________________   ____________________________________________________________________________________________  CBD  (cannabidiol) WARNING  Applicable to: All individuals currently  taking or considering taking CBD (cannabidiol) and, more important, all patients taking opioid analgesic controlled substances (pain medication). (Example: oxycodone; oxymorphone; hydrocodone; hydromorphone; morphine; methadone; tramadol; tapentadol; fentanyl; buprenorphine; butorphanol; dextromethorphan; meperidine; codeine; etc.)  Legal status: CBD remains a Schedule I drug prohibited for any use. CBD is illegal with one exception. In the [place], CBD has a limited Transport planner (FDA) approval for the treatment of two specific types of epilepsy disorders. Only one CBD product has been approved by the FDA for this purpose: "Epidiolex". FDA is aware that some companies are marketing products containing cannabis and cannabis-derived compounds in ways that violate the [employer], Drug and Cosmetic Act [facility] Act) and that may put the health and safety of consumers at risk. The FDA, a Federal agency, has not enforced the CBD status since 2018.   Legality: Some manufacturers ship CBD products nationally, which is illegal. Often such products are sold online and are therefore available throughout the country. CBD is openly sold in head shops and health food stores in some states where such sales have not been explicitly legalized. Selling unapproved products with unsubstantiated therapeutic claims is not only a violation of the law, but also can put patients at risk, as these products have not been proven to be safe or effective. Federal illegality makes it difficult to conduct research on CBD.  Reference: "FDA Regulation of Cannabis and Cannabis-Derived Products, Including Cannabidiol (CBD)" - [employer]  Warning: CBD is not FDA approved and has not undergo the same manufacturing controls as prescription  drugs.  This means that the purity and safety of available CBD may be questionable. Most of the time, despite manufacturer's claims, it is contaminated with THC (delta-9-tetrahydrocannabinol - the chemical in marijuana responsible for the "HIGH").  When this is the case, the [facility] contaminant will trigger a positive urine drug screen (UDS) test for Marijuana (carboxy-THC). Because a positive UDS for any illicit substance is a violation of our medication agreement, your opioid analgesics (pain medicine) may be permanently discontinued.  MORE ABOUT CBD  General Information: CBD  is a derivative of the Marijuana (cannabis sativa) plant discovered in 37. It is one of the 113 identified substances found in Marijuana. It accounts for up to 40% of the plant's extract. As of 2018, preliminary clinical studies on CBD included research for the treatment of anxiety, movement disorders, and pain. CBD is available and consumed in multiple forms, including inhalation of smoke or vapor, as an aerosol spray, and by mouth. It may be supplied as an oil containing CBD, capsules, dried cannabis, or as a liquid solution. CBD is thought not to be as psychoactive as THC (delta-9-tetrahydrocannabinol - the chemical in marijuana responsible for the "HIGH"). Studies suggest that CBD may interact with different biological target receptors in the body, including cannabinoid and other neurotransmitter receptors. As of 2018 the mechanism of action for its biological effects has not been determined.  Side-effects  Adverse reactions: Dry mouth, diarrhea, decreased appetite, fatigue, drowsiness, malaise, weakness, sleep disturbances, and others.  Drug interactions: CBC may interact with other medications such as blood-thinners. (Last update:  10/23/2019) ____________________________________________________________________________________________   ____________________________________________________________________________________________  Drug Holidays (Slow)  What is a "Drug Holiday"? Drug Holiday: is the name given to the period of time during which a patient stops taking a medication(s) for the purpose of eliminating tolerance to the drug.  Benefits . Improved effectiveness of opioids. . Decreased opioid dose needed to achieve benefits. . Improved pain with lesser dose.  What is tolerance? Tolerance: is the progressive decreased in effectiveness of a drug due to its repetitive use. With repetitive use, the body gets use to the medication and as a consequence, it loses its effectiveness. This is a common problem seen with opioid pain medications. As a result, a larger dose of the drug is needed to achieve the same effect that used to be obtained with a smaller dose.  How long should a "Drug Holiday" last? You should stay off of the pain medicine for at least 14 consecutive days. (2 weeks)  Should I stop the medicine "cold [place]"? No. You should always coordinate with your Pain Specialist so that he/she can provide you with the correct medication dose to make the transition as smoothly as possible.  How do I stop the medicine? Slowly. You will be instructed to decrease the daily amount of pills that you take by one (1) pill every seven (7) days. This is called a "slow downward taper" of your dose. For example: if you normally take four (4) pills per day, you will be asked to drop this dose to three (3) pills per day for seven (7) days, then to two (2) pills per day for seven (7) days, then to one (1) per day for seven (7) days, and at the end of those last seven (7) days, this is when the "Drug Holiday" would start.   Will I have withdrawals? By doing a "slow downward taper" like this one, it is unlikely that you will  experience any significant withdrawal symptoms. Typically, what triggers withdrawals is the sudden stop of a high dose opioid therapy. Withdrawals can usually be avoided by slowly decreasing the dose over a prolonged period of time. If you do not follow these instructions and decide to stop your medication abruptly, withdrawals may be possible.  What are withdrawals? Withdrawals: refers to the wide range of symptoms that occur after stopping or dramatically reducing opiate drugs after heavy and prolonged use. Withdrawal symptoms do not occur to patients that use low dose opioids, or those who take the medication sporadically. Contrary to benzodiazepine (example: Valium, Xanax, etc.) or alcohol withdrawals ("Delirium Tremens"), opioid withdrawals are not lethal. Withdrawals are the physical manifestation of the body getting rid of the excess receptors.  Expected Symptoms Early symptoms of withdrawal may include: . Agitation . Anxiety . Muscle aches . Increased tearing . Insomnia . Runny nose . Sweating . Yawning  Late symptoms of withdrawal may include: . Abdominal cramping . Diarrhea . Dilated pupils . Goose bumps . Nausea . Vomiting  Will I experience withdrawals? Due to the slow nature of the taper, it is very unlikely that you will experience any.  What is a slow taper? Taper: refers to the gradual decrease in dose.  (Last update: 10/06/2019) ____________________________________________________________________________________________    ____________________________________________________________________________________________  Preparing for Procedure with Sedation  Procedure appointments are limited to planned procedures: . No Prescription Refills. . No disability issues will be discussed. . No medication changes will be discussed.  Instructions: . Oral Intake: Do not eat or drink anything for at least 8 hours prior to your procedure. (Exception: Blood Pressure  Medication. See below.) . Transportation: Unless otherwise stated by your physician, you may drive yourself after the procedure. . Blood Pressure Medicine: Do not forget to take your blood pressure medicine with a sip of water the morning of the procedure. If your Diastolic (lower reading)is above 100 mmHg, elective cases will be cancelled/rescheduled. . Blood thinners: These will  need to be stopped for procedures. Notify our staff if you are taking any blood thinners. Depending on which one you take, there will be specific instructions on how and when to stop it. . Diabetics on insulin: Notify the staff so that you can be scheduled 1st case in the morning. If your diabetes requires high dose insulin, take only  of your normal insulin dose the morning of the procedure and notify the staff that you have done so. . Preventing infections: Shower with an antibacterial soap the morning of your procedure. . Build-up your immune system: Take 1000 mg of Vitamin C with every meal (3 times a day) the day prior to your procedure. Marland Kitchen Antibiotics: Inform the staff if you have a condition or reason that requires you to take antibiotics before dental procedures. . Pregnancy: If you are pregnant, call and cancel the procedure. . Sickness: If you have a cold, fever, or any active infections, call and cancel the procedure. . Arrival: You must be in the facility at least 30 minutes prior to your scheduled procedure. . Children: Do not bring children with you. . Dress appropriately: Bring dark clothing that you would not mind if they get stained. . Valuables: Do not bring any jewelry or valuables.  Reasons to call and reschedule or cancel your procedure: (Following these recommendations will minimize the risk of a serious complication.) . Surgeries: Avoid having procedures within 2 weeks of any surgery. (Avoid for 2 weeks before or after any surgery). . Flu Shots: Avoid having procedures within 2 weeks of a flu shots  or . (Avoid for 2 weeks before or after immunizations). . Barium: Avoid having a procedure within 7-10 days after having had a radiological study involving the use of radiological contrast. (Myelograms, Barium swallow or enema study). . Heart attacks: Avoid any elective procedures or surgeries for the initial 6 months after a "Myocardial Infarction" (Heart Attack). . Blood thinners: It is imperative that you stop these medications before procedures. Let us know if you if you take any blood thinner.  . Infection: Avoid procedures during or within two weeks of an infection (including chest colds or gastrointestinal problems). Symptoms associated with infections include: Localized redness, fever, chills, night sweats or profuse sweating, burning sensation when voiding, cough, congestion, stuffiness, runny nose, sore throat, diarrhea, nausea, vomiting, cold or Flu symptoms, recent or current infections. It is specially important if the infection is over the area that we intend to treat. Marland Kitchen Heart and lung problems: Symptoms that may suggest an active cardiopulmonary problem include: cough, chest pain, breathing difficulties or shortness of breath, dizziness, ankle swelling, uncontrolled high or unusually low blood pressure, and/or palpitations. If you are experiencing any of these symptoms, cancel your procedure and contact your primary care physician for an evaluation.  Remember:  Regular Business hours are:  Monday to Thursday 8:00 AM to 4:00 PM  Provider's Schedule: Milinda Pointer, MD:  Procedure days: Tuesday and Thursday 7:30 AM to 4:00 PM  Gillis Santa, MD:  Procedure days: Monday and Wednesday 7:30 AM to 4:00 PM ____________________________________________________________________________________________

## 2019-12-01 NOTE — Progress Notes (Signed)
Nursing Pain Medication Assessment:  Safety precautions to be maintained throughout the outpatient stay will include: orient to surroundings, keep bed in low position, maintain call bell within reach at all times, provide assistance with transfer out of bed and ambulation.  Medication Inspection Compliance: Chad Wilson did not comply with our request to bring his pills to be counted. He was reminded that bringing the medication bottles, even when empty, is a requirement.  Medication: None brought in. Pill/Patch Count: None available to be counted. Bottle Appearance: No container available. Did not bring bottle(s) to appointment. Filled Date: N/A Last Medication intake:  YesterdaySafety precautions to be maintained throughout the outpatient stay will include: orient to surroundings, keep bed in low position, maintain call bell within reach at all times, provide assistance with transfer out of bed and ambulation.

## 2019-12-13 ENCOUNTER — Other Ambulatory Visit: Payer: Self-pay | Admitting: Adult Health

## 2019-12-13 ENCOUNTER — Other Ambulatory Visit (HOSPITAL_COMMUNITY): Payer: Self-pay | Admitting: Urology

## 2019-12-13 DIAGNOSIS — F419 Anxiety disorder, unspecified: Secondary | ICD-10-CM

## 2019-12-13 MED FILL — ATORVASTATIN 40 MG TABLET: 40 | 90 days supply | Qty: 90 | Fill #1

## 2019-12-13 MED FILL — TESTOSTERONE CYP 200 MG/ML: 200 | 28 days supply | Qty: 2 | Fill #0

## 2019-12-13 NOTE — Telephone Encounter (Signed)
Rx prescribed 5/12 for #60 x1 BID.  Received refill and patient is not taking as prescribed.  I called patient and he states he takes 1 QAM, but forgets to take the PM dose.  He does not have a F/U appointment scheduled.  Do you want to change dosing?

## 2019-12-14 ENCOUNTER — Other Ambulatory Visit: Payer: Self-pay | Admitting: Adult Health

## 2019-12-14 MED FILL — busPIRone HCL 5 MG TABS: 5 | 30 days supply | Qty: 60 | Fill #0

## 2019-12-22 ENCOUNTER — Other Ambulatory Visit: Payer: Self-pay | Admitting: Adult Health

## 2019-12-22 DIAGNOSIS — F419 Anxiety disorder, unspecified: Secondary | ICD-10-CM

## 2019-12-22 MED FILL — ALPRAZolam 0.5 MG TABS: 0.5 | 30 days supply | Qty: 60 | Fill #0

## 2019-12-22 NOTE — Telephone Encounter (Signed)
Alprazolam refill request   Last filled on 11/08/2019.   Last visit with PCP was 09/02/2019  No follow up appt scheduled at this time.

## 2019-12-28 ENCOUNTER — Ambulatory Visit: Payer: 59 | Admitting: Pain Medicine

## 2019-12-28 MED FILL — PANTOPRAZOLE SOD DR 40 MG T: 40 | 30 days supply | Qty: 30 | Fill #4

## 2019-12-31 MED FILL — HYDROCODON-APAP 7.5-325: 7.5-325 | 30 days supply | Qty: 120 | Fill #0

## 2020-01-04 MED FILL — SILDENAFIL CITRATE 20 MG TA: 20 | 90 days supply | Qty: 90 | Fill #1

## 2020-01-17 MED FILL — SERTRALINE HCL 100 MG TAB: 100 | 90 days supply | Qty: 180 | Fill #1

## 2020-01-18 ENCOUNTER — Ambulatory Visit (HOSPITAL_BASED_OUTPATIENT_CLINIC_OR_DEPARTMENT_OTHER): Payer: 59 | Admitting: Pain Medicine

## 2020-01-18 ENCOUNTER — Ambulatory Visit
Admission: RE | Admit: 2020-01-18 | Discharge: 2020-01-18 | Disposition: A | Discharge status: Home / Self Care | Payer: 59 | Source: Ambulatory Visit | Attending: Pain Medicine | Admitting: Pain Medicine

## 2020-01-18 ENCOUNTER — Other Ambulatory Visit: Payer: Self-pay

## 2020-01-18 ENCOUNTER — Encounter: Payer: Self-pay | Admitting: Pain Medicine

## 2020-01-18 ENCOUNTER — Other Ambulatory Visit: Payer: Self-pay | Admitting: Pain Medicine

## 2020-01-18 VITALS — BP 141/89 | HR 68 | Temp 97.3°F | Resp 16 | Ht 66.0 in | Wt 205.0 lb

## 2020-01-18 DIAGNOSIS — M792 Neuralgia and neuritis, unspecified: Secondary | ICD-10-CM | POA: Diagnosis not present

## 2020-01-18 DIAGNOSIS — G894 Chronic pain syndrome: Secondary | ICD-10-CM

## 2020-01-18 DIAGNOSIS — M5137 Other intervertebral disc degeneration, lumbosacral region: Secondary | ICD-10-CM | POA: Insufficient documentation

## 2020-01-18 DIAGNOSIS — G8929 Other chronic pain: Secondary | ICD-10-CM | POA: Diagnosis not present

## 2020-01-18 DIAGNOSIS — M47816 Spondylosis without myelopathy or radiculopathy, lumbar region: Secondary | ICD-10-CM | POA: Diagnosis not present

## 2020-01-18 DIAGNOSIS — M47817 Spondylosis without myelopathy or radiculopathy, lumbosacral region: Secondary | ICD-10-CM | POA: Diagnosis not present

## 2020-01-18 DIAGNOSIS — M545 Low back pain, unspecified: Secondary | ICD-10-CM | POA: Diagnosis not present

## 2020-01-18 DIAGNOSIS — G8918 Other acute postprocedural pain: Secondary | ICD-10-CM

## 2020-01-18 MED ORDER — HYDROCODONE-ACETAMINOPHEN 7.5-325 MG PO TABS: 1.0000 | ORAL_TABLET | Freq: Four times a day (QID) | ORAL | 0 refills | Status: DC | PRN

## 2020-01-18 MED ORDER — LIDOCAINE HCL 2 % IJ SOLN
20.0000 mL | Freq: Once | INTRAMUSCULAR | Status: AC
  Administered 2020-01-18: 400 mg
  Filled 2020-01-18: qty 20

## 2020-01-18 MED ORDER — FENTANYL CITRATE (PF) 100 MCG/2ML IJ SOLN
25.0000 ug | INTRAMUSCULAR | Status: DC | PRN
  Administered 2020-01-18: 50 ug via INTRAVENOUS
  Filled 2020-01-18: qty 2

## 2020-01-18 MED ORDER — PREGABALIN 225 MG PO CAPS: 225.0000 mg | ORAL_CAPSULE | Freq: Two times a day (BID) | ORAL | 2 refills | Status: DC

## 2020-01-18 MED ORDER — OXYCODONE-ACETAMINOPHEN 5-325 MG PO TABS: 1.0000 | ORAL_TABLET | Freq: Three times a day (TID) | ORAL | 0 refills | Status: DC | PRN

## 2020-01-18 MED ORDER — TRIAMCINOLONE ACETONIDE 40 MG/ML IJ SUSP
40.0000 mg | Freq: Once | INTRAMUSCULAR | Status: AC
  Administered 2020-01-18: 40 mg
  Filled 2020-01-18: qty 1

## 2020-01-18 MED ORDER — ROPIVACAINE HCL 2 MG/ML IJ SOLN
9.0000 mL | Freq: Once | INTRAMUSCULAR | Status: AC
  Administered 2020-01-18: 9 mL via PERINEURAL
  Filled 2020-01-18: qty 10

## 2020-01-18 MED ORDER — LACTATED RINGERS IV SOLN
1000.0000 mL | Freq: Once | INTRAVENOUS | Status: AC
  Administered 2020-01-18: 1000 mL via INTRAVENOUS

## 2020-01-18 MED ORDER — MIDAZOLAM HCL 5 MG/5ML IJ SOLN
1.0000 mg | INTRAMUSCULAR | Status: DC | PRN
  Administered 2020-01-18: 1 mg via INTRAVENOUS
  Administered 2020-01-18: 2 mg via INTRAVENOUS
  Filled 2020-01-18: qty 5

## 2020-01-18 MED FILL — OXYCODONE-APAP 5-325MG: 5-325 | 7 days supply | Qty: 21 | Fill #0

## 2020-01-18 MED FILL — PREGABALIN 225 MG CAPS: 225 | 30 days supply | Qty: 60 | Fill #0

## 2020-01-18 NOTE — Patient Instructions (Signed)
___________________________________________________________________________________________  Post-Radiofrequency (RF) Discharge Instructions  You have just completed a Radiofrequency Neurotomy.  The following instructions will provide you with information and guidelines for self-care upon discharge.  If at any time you have questions or concerns please call your physician. DO NOT DRIVE YOURSELF!!  Instructions:  Apply ice: Fill a plastic sandwich bag with crushed ice. Cover it with a small towel and apply to injection site. Apply for 15 minutes then remove x 15 minutes. Repeat sequence on day of procedure, until you go to bed. The purpose is to minimize swelling and discomfort after procedure.  Apply heat: Apply heat to procedure site starting the day following the procedure. The purpose is to treat any soreness and discomfort from the procedure.  Food intake: No eating limitations, unless stipulated above.  Nevertheless, if you have had sedation, you may experience some nausea.  In this case, it may be wise to wait at least two hours prior to resuming regular diet.  Physical activities: Keep activities to a minimum for the first 8 hours after the procedure. For the first 24 hours after the procedure, do not drive a motor vehicle,  Operate heavy machinery, power tools, or handle any weapons.  Consider walking with the use of an assistive device or accompanied by an adult for the first 24 hours.  Do not drink alcoholic beverages including beer.  Do not make any important decisions or sign any legal documents. Go home and rest today.  Resume activities tomorrow, as tolerated.  Use caution in moving about as you may experience mild leg weakness.  Use caution in cooking, use of household electrical appliances and climbing steps.  Driving: If you have received any sedation, you are not allowed to drive for 24 hours after your procedure.  Blood thinner: Restart your blood thinner 6 hours after your  procedure. (Only for those taking blood thinners)  Insulin: As soon as you can eat, you may resume your normal dosing schedule. (Only for those taking insulin)  Medications: May resume pre-procedure medications.  Do not take any drugs, other than what has been prescribed to you.  Infection prevention: Keep procedure site clean and dry.  Post-procedure Pain Diary: Extremely important that this be done correctly and accurately. Recorded information will be used to determine the next step in treatment.  Pain evaluated is that of treated area only. Do not include pain from an untreated area.  Complete every hour, on the hour, for the initial 8 hours. Set an alarm to help you do this part accurately.  Do not go to sleep and have it completed later. It will not be accurate.  Follow-up appointment: Keep your follow-up appointment after the procedure. Usually 2-6 weeks after radiofrequency. Bring you pain diary. The information collected will be essential for your long-term care.   Expect:  From numbing medicine (AKA: Local Anesthetics): Numbness or decrease in pain.  Onset: Full effect within 15 minutes of injected.  Duration: It will depend on the type of local anesthetic used. On the average, 1 to 8 hours.   From steroids (when added): Decrease in swelling or inflammation. Once inflammation is improved, relief of the pain will follow.  Onset of benefits: Depends on the amount of swelling present. The more swelling, the longer it will take for the benefits to be seen. In some cases, up to 10 days.  Duration: Steroids will stay in the system x 2 weeks. Duration of benefits will depend on multiple posibilities including persistent irritating factors.  From procedure: Some discomfort is to be expected once the numbing medicine wears off. In the case of radiofrequency procedures, this may last as long as 6 weeks. Additional post-procedure pain medication is provided for this. Discomfort is  minimized if ice and heat are applied as instructed.  Call if:  You experience numbness and weakness that gets worse with time, as opposed to wearing off.  He experience any unusual bleeding, difficulty breathing, or loss of the ability to control your bowel and bladder. (This applies to Spinal procedures only)  You experience any redness, swelling, heat, red streaks, elevated temperature, fever, or any other signs of a possible infection.  Emergency Numbers:  [place] business hours (Monday - Thursday, 8:00 AM - 4:00 PM) (Friday, 9:00 AM - 12:00 Noon): [phone]  After hours: [phone] ____________________________________________________________________________________________

## 2020-01-18 NOTE — Progress Notes (Signed)
PROVIDER NOTE: Information contained herein reflects review and annotations entered in association with encounter. Interpretation of such information and data should be left to medically-trained personnel. Information provided to patient can be located elsewhere in the medical record under "Patient Instructions". Document created using STT-dictation technology, any transcriptional errors that may result from process are unintentional.    Patient: Chad Wilson  Service Category: Procedure  Provider: [name], MD  DOB: 1976-02-18  DOS: 01/18/2020  Location: [facility]  MRN: [id number]  Setting: Ambulatory - outpatient  Referring Provider: Milinda Pointer, MD  Type: Established Patient  Specialty: Interventional Pain Management  PCP: Dorothyann Peng, NP   Primary Reason for Visit: Interventional Pain Management Treatment. CC: Back Pain (lumbar right )  Procedure:          Anesthesia, Analgesia, Anxiolysis:  Type: Thermal Lumbar Facet, Medial Branch Radiofrequency Ablation/Neurotomy  #2  Primary Purpose: Therapeutic Region: Posterolateral Lumbosacral Spine Level: L2, L3, L4, L5, & S1 Medial Branch Level(s). These levels will denervate the L3-4, L4-5, and the L5-S1 lumbar facet joints. Laterality: Right  Type: Moderate (Conscious) Sedation combined with Local Anesthesia Indication(s): Analgesia and Anxiety Route: Intravenous (IV) IV Access: Secured Sedation: Meaningful verbal contact was maintained at all times during the procedure  Local Anesthetic: Lidocaine 1-2%  Position: Prone   Indications: 1. Lumbar facet syndrome (Bilateral)   2. Spondylosis without myelopathy or radiculopathy, lumbosacral region   3. Lumbar facet hypertrophy (Bilateral)   4. DDD (degenerative disc disease), lumbosacral   5. Chronic low back pain (Primary area of Pain) (Bilateral) (R=L) w/o sciatica   6. Acute postoperative pain   7. Neurogenic pain   8. Chronic pain syndrome    Chad Wilson  has been dealing with the above chronic pain for longer than three months and has either failed to respond, was unable to tolerate, or simply did not get enough benefit from other more conservative therapies including, but not limited to: 1. Over-the-counter medications 2. Anti-inflammatory medications 3. Muscle relaxants 4. Membrane stabilizers 5. Opioids 6. Physical therapy and/or chiropractic manipulation 7. Modalities (Heat, ice, etc.) 8. Invasive techniques such as nerve blocks. Chad Wilson has attained more than 50% relief of the pain from a series of diagnostic injections conducted in separate occasions.  Pain Score: Pre-procedure: 5 /10 Post-procedure: 0-No pain/10  RTCB: 04/29/2020  Pre-op Assessment:  Chad Wilson is a 44 y.o. (year old), male patient, seen today for interventional treatment. He  has a past surgical history that includes Multiple tooth extractions (15 yrs. ago); Anterior cervical decomp/discectomy fusion (N/A, 05/21/2017); and Wisdom tooth extraction. Chad Wilson has a current medication list which includes the following prescription(s): albuterol, alprazolam, atorvastatin, buspirone, colchicine, depo-testosterone, [START ON 01/30/2020] hydrocodone-acetaminophen, [START ON 02/29/2020] hydrocodone-acetaminophen, [START ON 03/30/2020] hydrocodone-acetaminophen, [START ON 04/29/2020] hydrocodone-acetaminophen, levothyroxine, lisinopril, nabumetone, nitroglycerin, oxycodone-acetaminophen, [START ON 01/25/2020] oxycodone-acetaminophen, pantoprazole, [START ON 02/29/2020] pregabalin, sildenafil, sertraline, and [DISCONTINUED] omeprazole, and the following Facility-Administered Medications: fentanyl and midazolam. His primarily concern today is the Back Pain (lumbar right )  Initial Vital Signs:  Pulse/HCG Rate: 67 (nsr)ECG Heart Rate: 65 Temp: (!) 97.2 F (36.2 C) Resp: 16 BP: (!) 164/94 SpO2: 97 %  BMI: Estimated body mass index is 33.09 kg/m as calculated from the following:    Height as of this encounter: 5' 6"  (1.676 m).   Weight as of this encounter: 205 lb (93 kg).  Risk Assessment: Allergies: Reviewed. He is allergic to chantix [varenicline].  Allergy Precautions: None required Coagulopathies: Reviewed. None identified.  Blood-thinner therapy: None at this time Active Infection(s): Reviewed. None identified. Chad Wilson is afebrile  Site Confirmation: Chad Wilson was asked to confirm the procedure and laterality before marking the site Procedure checklist: Completed Consent: Before the procedure and under the influence of no sedative(s), amnesic(s), or anxiolytics, the patient was informed of the treatment options, risks and possible complications. To fulfill our ethical and legal obligations, as recommended by the American Medical Association's Code of Ethics, I have informed the patient of my clinical impression; the nature and purpose of the treatment or procedure; the risks, benefits, and possible complications of the intervention; the alternatives, including doing nothing; the risk(s) and benefit(s) of the alternative treatment(s) or procedure(s); and the risk(s) and benefit(s) of doing nothing. The patient was provided information about the general risks and possible complications associated with the procedure. These may include, but are not limited to: failure to achieve desired goals, infection, bleeding, organ or nerve damage, allergic reactions, paralysis, and death. In addition, the patient was informed of those risks and complications associated to Spine-related procedures, such as failure to decrease pain; infection (i.e.: Meningitis, epidural or intraspinal abscess); bleeding (i.e.: epidural hematoma, subarachnoid hemorrhage, or any other type of intraspinal or peri-dural bleeding); organ or nerve damage (i.e.: Any type of peripheral nerve, nerve root, or spinal cord injury) with subsequent damage to sensory, motor, and/or autonomic systems, resulting in  permanent pain, numbness, and/or weakness of one or several areas of the body; allergic reactions; (i.e.: anaphylactic reaction); and/or death. Furthermore, the patient was informed of those risks and complications associated with the medications. These include, but are not limited to: allergic reactions (i.e.: anaphylactic or anaphylactoid reaction(s)); adrenal axis suppression; blood sugar elevation that in diabetics may result in ketoacidosis or comma; water retention that in patients with history of congestive heart failure may result in shortness of breath, pulmonary edema, and decompensation with resultant heart failure; weight gain; swelling or edema; medication-induced neural toxicity; particulate matter embolism and blood vessel occlusion with resultant organ, and/or nervous system infarction; and/or aseptic necrosis of one or more joints. Finally, the patient was informed that Medicine is not an exact science; therefore, there is also the possibility of unforeseen or unpredictable risks and/or possible complications that may result in a catastrophic outcome. The patient indicated having understood very clearly. We have given the patient no guarantees and we have made no promises. Enough time was given to the patient to ask questions, all of which were answered to the patient's satisfaction. Mr. Henk has indicated that he wanted to continue with the procedure. Attestation: I, the ordering provider, attest that I have discussed with the patient the benefits, risks, side-effects, alternatives, likelihood of achieving goals, and potential problems during recovery for the procedure that I have provided informed consent. Date  Time: 01/18/2020 10:25 AM  Pre-Procedure Preparation:  Monitoring: As per clinic protocol. Respiration, ETCO2, SpO2, BP, heart rate and rhythm monitor placed and checked for adequate function Safety Precautions: Patient was assessed for positional comfort and pressure points before  starting the procedure. Time-out: I initiated and conducted the "Time-out" before starting the procedure, as per protocol. The patient was asked to participate by confirming the accuracy of the "Time Out" information. Verification of the correct person, site, and procedure were performed and confirmed by me, the nursing staff, and the patient. "Time-out" conducted as per Joint Commission's Universal Protocol (UP.01.01.01). Time: 1105  Description of Procedure:          Laterality: Right Levels:  L2, L3, L4, L5, & S1 Medial Branch Level(s), at the L3-4, L4-5, and the L5-S1 lumbar facet joints. Area Prepped: Lumbosacral DuraPrep (Iodine Povacrylex [0.7% available iodine] and Isopropyl Alcohol, 74% w/w) Safety Precautions: Aspiration looking for blood return was conducted prior to all injections. At no point did we inject any substances, as a needle was being advanced. Before injecting, the patient was told to immediately notify me if he was experiencing any new onset of "ringing in the ears, or metallic taste in the mouth". No attempts were made at seeking any paresthesias. Safe injection practices and needle disposal techniques used. Medications properly checked for expiration dates. SDV (single dose vial) medications used. After the completion of the procedure, all disposable equipment used was discarded in the proper designated medical waste containers. Local Anesthesia: Protocol guidelines were followed. The patient was positioned over the fluoroscopy table. The area was prepped in the usual manner. The time-out was completed. The target area was identified using fluoroscopy. A 12-in long, straight, sterile hemostat was used with fluoroscopic guidance to locate the targets for each level blocked. Once located, the skin was marked with an approved surgical skin marker. Once all sites were marked, the skin (epidermis, dermis, and hypodermis), as well as deeper tissues (fat, connective tissue and muscle)  were infiltrated with a small amount of a short-acting local anesthetic, loaded on a 10cc syringe with a 25G, 1.5-in  Needle. An appropriate amount of time was allowed for local anesthetics to take effect before proceeding to the next step. Local Anesthetic: Lidocaine 2.0% The unused portion of the local anesthetic was discarded in the proper designated containers. Technical explanation of process:  Radiofrequency Ablation (RFA) L2 Medial Branch Nerve RFA: The target area for the L2 medial branch is at the junction of the postero-lateral aspect of the superior articular process and the superior, posterior, and medial edge of the transverse process of L3. Under fluoroscopic guidance, a Radiofrequency needle was inserted until contact was made with os over the superior postero-lateral aspect of the pedicular shadow (target area). Sensory and motor testing was conducted to properly adjust the position of the needle. Once satisfactory placement of the needle was achieved, the numbing solution was slowly injected after negative aspiration for blood. 2.0 mL of the nerve block solution was injected without difficulty or complication. After waiting for at least 3 minutes, the ablation was performed. Once completed, the needle was removed intact. L3 Medial Branch Nerve RFA: The target area for the L3 medial branch is at the junction of the postero-lateral aspect of the superior articular process and the superior, posterior, and medial edge of the transverse process of L4. Under fluoroscopic guidance, a Radiofrequency needle was inserted until contact was made with os over the superior postero-lateral aspect of the pedicular shadow (target area). Sensory and motor testing was conducted to properly adjust the position of the needle. Once satisfactory placement of the needle was achieved, the numbing solution was slowly injected after negative aspiration for blood. 2.0 mL of the nerve block solution was injected without  difficulty or complication. After waiting for at least 3 minutes, the ablation was performed. Once completed, the needle was removed intact. L4 Medial Branch Nerve RFA: The target area for the L4 medial branch is at the junction of the postero-lateral aspect of the superior articular process and the superior, posterior, and medial edge of the transverse process of L5. Under fluoroscopic guidance, a Radiofrequency needle was inserted until contact was made with os over the  superior postero-lateral aspect of the pedicular shadow (target area). Sensory and motor testing was conducted to properly adjust the position of the needle. Once satisfactory placement of the needle was achieved, the numbing solution was slowly injected after negative aspiration for blood. 2.0 mL of the nerve block solution was injected without difficulty or complication. After waiting for at least 3 minutes, the ablation was performed. Once completed, the needle was removed intact. L5 Medial Branch Nerve RFA: The target area for the L5 medial branch is at the junction of the postero-lateral aspect of the superior articular process of S1 and the superior, posterior, and medial edge of the sacral ala. Under fluoroscopic guidance, a Radiofrequency needle was inserted until contact was made with os over the superior postero-lateral aspect of the pedicular shadow (target area). Sensory and motor testing was conducted to properly adjust the position of the needle. Once satisfactory placement of the needle was achieved, the numbing solution was slowly injected after negative aspiration for blood. 2.0 mL of the nerve block solution was injected without difficulty or complication. After waiting for at least 3 minutes, the ablation was performed. Once completed, the needle was removed intact. S1 Medial Branch Nerve RFA: The target area for the S1 medial branch is located inferior to the junction of the S1 superior articular process and the L5 inferior  articular process, posterior, inferior, and lateral to the 6 o'clock position of the L5-S1 facet joint, just superior to the S1 posterior foramen. Under fluoroscopic guidance, the Radiofrequency needle was advanced until contact was made with os over the Target area. Sensory and motor testing was conducted to properly adjust the position of the needle. Once satisfactory placement of the needle was achieved, the numbing solution was slowly injected after negative aspiration for blood. 2.0 mL of the nerve block solution was injected without difficulty or complication. After waiting for at least 3 minutes, the ablation was performed. Once completed, the needle was removed intact. Radiofrequency lesioning (ablation):  Radiofrequency Generator: NeuroTherm [id number] Sensory Stimulation Parameters: 50 Hz was used to locate & identify the nerve, making sure that the needle was positioned such that there was no sensory stimulation below 0.3 V or above 0.7 V. Motor Stimulation Parameters: 2 Hz was used to evaluate the motor component. Care was taken not to lesion any nerves that demonstrated motor stimulation of the lower extremities at an output of less than 2.5 times that of the sensory threshold, or a maximum of 2.0 V. Lesioning Technique Parameters: Standard Radiofrequency settings. (Not bipolar or pulsed.) Temperature Settings: 80 degrees C Lesioning time: 60 seconds Intra-operative Compliance: Compliant Materials & Medications: Needle(s) (Electrode/Cannula) Type: Teflon-coated, curved tip, Radiofrequency needle(s) Gauge: 22G Length: 10cm Numbing solution: 0.2% PF-Ropivacaine + Triamcinolone (40 mg/mL) diluted to a final concentration of 4 mg of Triamcinolone/mL of Ropivacaine The unused portion of the solution was discarded in the proper designated containers.  Once the entire procedure was completed, the treated area was cleaned, making sure to leave some of the prepping solution back to take advantage of  its long term bactericidal properties.  Illustration of the posterior view of the lumbar spine and the posterior neural structures. Laminae of L2 through S1 are labeled. DPRL5, dorsal primary ramus of L5; DPRS1, dorsal primary ramus of S1; DPR3, dorsal primary ramus of L3; FJ, facet (zygapophyseal) joint L3-L4; I, inferior articular process of L4; LB1, lateral branch of dorsal primary ramus of L1; IAB, inferior articular branches from L3 medial branch (supplies L4-L5 facet joint); IBP,  intermediate branch plexus; MB3, medial branch of dorsal primary ramus of L3; NR3, third lumbar nerve root; S, superior articular process of L5; SAB, superior articular branches from L4 (supplies L4-5 facet joint also); TP3, transverse process of L3.  Vitals:   01/18/20 1135 01/18/20 1145 01/18/20 1155 01/18/20 1206  BP: (!) 142/91 (!) 142/87 (!) 150/99 (!) 141/89  Pulse: 68     Resp: 18 16 16 16   Temp:  (!) 97.3 F (36.3 C)  (!) 97.3 F (36.3 C)  TempSrc:      SpO2: 96% 98% 98% 98%  Weight:      Height:       Start Time: 1105 hrs. End Time: 1135 hrs.  Imaging Guidance (Spinal):          Type of Imaging Technique: Fluoroscopy Guidance (Spinal) Indication(s): Assistance in needle guidance and placement for procedures requiring needle placement in or near specific anatomical locations not easily accessible without such assistance. Exposure Time: Please see nurses notes. Contrast: None used. Fluoroscopic Guidance: I was personally present during the use of fluoroscopy. "Tunnel Vision Technique" used to obtain the best possible view of the target area. Parallax error corrected before commencing the procedure. "Direction-depth-direction" technique used to introduce the needle under continuous pulsed fluoroscopy. Once target was reached, antero-posterior, oblique, and lateral fluoroscopic projection used confirm needle placement in all planes. Images permanently stored in EMR. Interpretation: No contrast injected.  I personally interpreted the imaging intraoperatively. Adequate needle placement confirmed in multiple planes. Permanent images saved into the patient's record.  Antibiotic Prophylaxis:   Anti-infectives (From admission, onward)   None     Indication(s): None identified  Post-operative Assessment:  Post-procedure Vital Signs:  Pulse/HCG Rate: 6866 Temp: (!) 97.3 F (36.3 C) Resp: 16 BP: (!) 141/89 SpO2: 98 %  EBL: None  Complications: No immediate post-treatment complications observed by team, or reported by patient.  Note: The patient tolerated the entire procedure well. A repeat set of vitals were taken after the procedure and the patient was kept under observation following institutional policy, for this type of procedure. Post-procedural neurological assessment was performed, showing return to baseline, prior to discharge. The patient was provided with post-procedure discharge instructions, including a section on how to identify potential problems. Should any problems arise concerning this procedure, the patient was given instructions to immediately contact us, at any time, without hesitation. In any case, we plan to contact the patient by telephone for a follow-up status report regarding this interventional procedure.  Comments:  No additional relevant information.  Plan of Care  Orders:  Orders Placed This Encounter  Procedures  . Radiofrequency,Lumbar    Scheduling Instructions:     Side(s): Right-sided     Level: L3-4, L4-5, & L5-S1 Facets (L2, L3, L4, L5, & S1 Medial Branch Nerves)     Sedation: Patient's choice.     Timeframe: Today    Order Specific Question:   Where will this procedure be performed?    Answer:   [facility]  . Radiofrequency,Lumbar    Standing Status:   Future    Standing Expiration Date:   01/17/2021    Scheduling Instructions:     Side(s): Left-sided     Level: L3-4, L4-5, & L5-S1 Facets (L2, L3, L4, L5, & S1 Medial Branch Nerves)      Sedation: Patient's choice.     Scheduling Timeframe: 2 weeks from now    Order Specific Question:   Where will this procedure be performed?  Answer:   [facility]  . DG PAIN [facility] C-ARM 1-60 MIN NO REPORT    Intraoperative interpretation by procedural physician at [place].    Standing Status:   Standing    Number of Occurrences:   1    Order Specific Question:   Reason for exam:    Answer:   Assistance in needle guidance and placement for procedures requiring needle placement in or near specific anatomical locations not easily accessible without such assistance.  . Informed Consent Details: Physician/Practitioner Attestation; Transcribe to consent form and obtain patient signature    Nursing Order: Transcribe to consent form and obtain patient signature. Note: Always confirm laterality of pain with Mr. Heider, before procedure.    Order Specific Question:   Physician/Practitioner attestation of informed consent for procedure/surgical case    Answer:   I, the physician/practitioner, attest that I have discussed with the patient the benefits, risks, side effects, alternatives, likelihood of achieving goals and potential problems during recovery for the procedure that I have provided informed consent.    Order Specific Question:   Procedure    Answer:   Lumbar Facet Radiofrequency Ablation    Order Specific Question:   Physician/Practitioner performing the procedure    Answer:   Tamme Mozingo A. Dossie Arbour, MD    Order Specific Question:   Indication/Reason    Answer:   Low Back Pain, with our without leg pain, due to Facet Joint Arthralgia (Joint Pain) known as Lumbar Facet Syndrome, secondary to Lumbar, and/or Lumbosacral Spondylosis (Arthritis of the Spine), without myelopathy or radiculopathy (Nerve Damage).  . Provide equipment / supplies at bedside    "Radiofrequency Tray"; Large hemostat (x1); Small hemostat (x1); Towels (x8); 4x4 sterile sponge pack (x1) Needle type:  Teflon-coated Radiofrequency Needle (Disposable  single use) Size: Regular Quantity: 5    Standing Status:   Standing    Number of Occurrences:   1    Order Specific Question:   Specify    Answer:   Radiofrequency Tray   Chronic Opioid Analgesic:  Oxycodone IR 5 mg, 1 tab PO q 6 hrs (20 mg/day of oxycodone) MME/day: 30 mg/day.   Medications ordered for procedure: Meds ordered this encounter  Medications  . lidocaine (XYLOCAINE) 2 % (with pres) injection 400 mg  . lactated ringers infusion 1,000 mL  . midazolam (VERSED) 5 MG/5ML injection 1-2 mg    Make sure Flumazenil is available in the pyxis when using this medication. If oversedation occurs, administer 0.2 mg IV over 15 sec. If after 45 sec no response, administer 0.2 mg again over 1 min; may repeat at 1 min intervals; not to exceed 4 doses (1 mg)  . fentaNYL (SUBLIMAZE) injection 25-50 mcg    Make sure Narcan is available in the pyxis when using this medication. In the event of respiratory depression (RR< 8/min): Titrate NARCAN (naloxone) in increments of 0.1 to 0.2 mg IV at 2-3 minute intervals, until desired degree of reversal.  . ropivacaine (PF) 2 mg/mL (0.2%) (NAROPIN) injection 9 mL  . triamcinolone acetonide (KENALOG-40) injection 40 mg  . pregabalin (LYRICA) 225 MG capsule    Sig: Take 1 capsule (225 mg total) by mouth 2 (two) times daily.    Dispense:  60 capsule    Refill:  2    Fill one day early if pharmacy is closed on scheduled refill date. May substitute for generic if available.  Marland Kitchen HYDROcodone-acetaminophen (NORCO) 7.5-325 MG tablet    Sig: Take 1 tablet  by mouth every 6 (six) hours as needed for severe pain. Must last 30 days.    Dispense:  120 tablet    Refill:  0    Chronic Pain: STOP Act (Not applicable) Fill 1 day early if closed on refill date. Avoid benzodiazepines within 8 hours of opioids  . HYDROcodone-acetaminophen (NORCO) 7.5-325 MG tablet    Sig: Take 1 tablet by mouth every 6 (six) hours as needed  for severe pain. Must last 30 days.    Dispense:  120 tablet    Refill:  0    Chronic Pain: STOP Act (Not applicable) Fill 1 day early if closed on refill date. Avoid benzodiazepines within 8 hours of opioids  . HYDROcodone-acetaminophen (NORCO) 7.5-325 MG tablet    Sig: Take 1 tablet by mouth every 6 (six) hours as needed for severe pain. Must last 30 days.    Dispense:  120 tablet    Refill:  0    Chronic Pain: STOP Act (Not applicable) Fill 1 day early if closed on refill date. Avoid benzodiazepines within 8 hours of opioids  . oxyCODONE-acetaminophen (PERCOCET) 5-325 MG tablet    Sig: Take 1 tablet by mouth every 8 (eight) hours as needed for up to 7 days for severe pain. Must last 7 days.    Dispense:  21 tablet    Refill:  0    For acute post-operative pain. Not to be refilled.  Must last 7 days.  Marland Kitchen oxyCODONE-acetaminophen (PERCOCET) 5-325 MG tablet    Sig: Take 1 tablet by mouth every 8 (eight) hours as needed for up to 7 days for severe pain. Must last 7 days.    Dispense:  21 tablet    Refill:  0    For acute post-operative pain. Not to be refilled.  Must last 7 days.   Medications administered: We administered lidocaine, lactated ringers, midazolam, fentaNYL, ropivacaine (PF) 2 mg/mL (0.2%), and triamcinolone acetonide.  See the medical record for exact dosing, route, and time of administration.  Follow-up plan:   Return in about 2 weeks (around 02/01/2020) for Radio-Frequency: (L) L-FCT RFA #2.       Considering:   Therapeutic bilateral lumbar facet RFA #2  (we will start on the right side)   PRN Procedures:   Palliative/therapeutic bilateral lumbar facet block #4  Palliative right lumbar facet RFA #2 (last done 12/10/2018) (100/100/100/100) Palliative left lumbar facet RFA #2 (last done 01/05/2019) (100/100/50/90-100)    Recent Visits Date Type Provider Dept  12/01/19 Office Visit Milinda Pointer, MD [facility]  Showing recent visits within past 90 days  and meeting all other requirements Today's Visits Date Type Provider Dept  01/18/20 Procedure visit Milinda Pointer, MD [facility]  Showing today's visits and meeting all other requirements Future Appointments Date Type Provider Dept  02/01/20 Appointment Milinda Pointer, [facility]  02/23/20 Appointment Milinda Pointer, MD [facility]  Showing future appointments within next 90 days and meeting all other requirements  Disposition: Discharge home  Discharge (Date  Time): 01/18/2020; 1208 hrs.   Primary Care Physician: Dorothyann Peng, NP Location: [facility] Note by: [name], MD Date: 01/18/2020; Time: 1:00 PM  Disclaimer:  Medicine is not an Chief Strategy Officer. The only guarantee in medicine is that nothing is guaranteed. It is important to note that the decision to proceed with this intervention was based on the information collected from the patient. The Data and conclusions were drawn from  the patient's questionnaire, the interview, and the physical examination. Because the information was provided in large part by the patient, it cannot be guaranteed that it has not been purposely or unconsciously manipulated. Every effort has been made to obtain as much relevant data as possible for this evaluation. It is important to note that the conclusions that lead to this procedure are derived in large part from the available data. Always take into account that the treatment will also be dependent on availability of resources and existing treatment guidelines, considered by other Pain Management Practitioners as being common knowledge and practice, at the time of the intervention. For Medico-Legal purposes, it is also important to point out that variation in procedural techniques and pharmacological choices are the acceptable norm. The indications, contraindications, technique, and results of the above procedure should only  be interpreted and judged by a Board-Certified Interventional Pain Specialist with extensive familiarity and expertise in the same exact procedure and technique.

## 2020-01-19 ENCOUNTER — Telehealth: Payer: Self-pay

## 2020-01-19 NOTE — Telephone Encounter (Signed)
Post procedure follow up..  Patient states he is doing good

## 2020-01-24 MED FILL — ALPRAZolam 0.5 MG TABS: 0.5 | 30 days supply | Qty: 60 | Fill #1

## 2020-01-25 MED FILL — OXYCODONE-APAP 5-325MG: 5-325 | 7 days supply | Qty: 21 | Fill #0

## 2020-01-28 MED FILL — busPIRone HCL 5 MG TABS: 5 | 30 days supply | Qty: 60 | Fill #1

## 2020-01-28 MED FILL — PANTOPRAZOLE SOD DR 40 MG T: 40 | 30 days supply | Qty: 30 | Fill #5

## 2020-01-31 ENCOUNTER — Telehealth: Payer: Self-pay

## 2020-01-31 MED FILL — HYDROCODON-APAP 7.5-325: 7.5-325 | 30 days supply | Qty: 120 | Fill #0

## 2020-01-31 NOTE — Telephone Encounter (Signed)
I  called the pharmacy, they told me someone has already called, and the issue has been resolved.

## 2020-01-31 NOTE — Telephone Encounter (Signed)
He is at the [facility] in [place]. They wont give him his medicine, they said we would have to call them to ok it. It is because Dr. Dossie Arbour gave him some break thru pain meds that they dont want to fill it, but it is time for him to get his meds so please call them and tell thim its ok, He is there now.

## 2020-02-01 ENCOUNTER — Ambulatory Visit: Payer: 59 | Admitting: Pain Medicine

## 2020-02-01 NOTE — Progress Notes (Deleted)
No show

## 2020-02-11 MED FILL — LISINOPRIL 40 MG TABS: 40 | 90 days supply | Qty: 90 | Fill #1

## 2020-02-11 MED FILL — LEVOTHYROXINE 175 MCG TABLE: 175 | 90 days supply | Qty: 90 | Fill #1

## 2020-02-15 MED FILL — TESTOSTERONE CYP 200 MG/ML: 200 | 28 days supply | Qty: 2 | Fill #1

## 2020-02-23 ENCOUNTER — Encounter: Payer: 59 | Admitting: Pain Medicine

## 2020-02-24 ENCOUNTER — Other Ambulatory Visit: Payer: Self-pay | Admitting: Pain Medicine

## 2020-02-24 ENCOUNTER — Ambulatory Visit
Admission: RE | Admit: 2020-02-24 | Discharge: 2020-02-24 | Disposition: A | Discharge status: Home / Self Care | Payer: 59 | Source: Ambulatory Visit | Attending: Pain Medicine | Admitting: Pain Medicine

## 2020-02-24 ENCOUNTER — Ambulatory Visit (HOSPITAL_BASED_OUTPATIENT_CLINIC_OR_DEPARTMENT_OTHER): Payer: 59 | Admitting: Pain Medicine

## 2020-02-24 ENCOUNTER — Other Ambulatory Visit: Payer: Self-pay

## 2020-02-24 ENCOUNTER — Encounter: Payer: Self-pay | Admitting: Pain Medicine

## 2020-02-24 VITALS — BP 141/95 | HR 69 | Temp 97.5°F | Resp 14 | Ht 66.0 in | Wt 207.0 lb

## 2020-02-24 DIAGNOSIS — F1120 Opioid dependence, uncomplicated: Secondary | ICD-10-CM | POA: Insufficient documentation

## 2020-02-24 DIAGNOSIS — G8918 Other acute postprocedural pain: Secondary | ICD-10-CM | POA: Insufficient documentation

## 2020-02-24 DIAGNOSIS — M4310 Spondylolisthesis, site unspecified: Secondary | ICD-10-CM | POA: Diagnosis not present

## 2020-02-24 DIAGNOSIS — M5137 Other intervertebral disc degeneration, lumbosacral region: Secondary | ICD-10-CM | POA: Insufficient documentation

## 2020-02-24 DIAGNOSIS — M47816 Spondylosis without myelopathy or radiculopathy, lumbar region: Secondary | ICD-10-CM | POA: Diagnosis not present

## 2020-02-24 DIAGNOSIS — M545 Low back pain, unspecified: Secondary | ICD-10-CM

## 2020-02-24 DIAGNOSIS — M47817 Spondylosis without myelopathy or radiculopathy, lumbosacral region: Secondary | ICD-10-CM | POA: Diagnosis not present

## 2020-02-24 DIAGNOSIS — G8929 Other chronic pain: Secondary | ICD-10-CM | POA: Insufficient documentation

## 2020-02-24 MED ORDER — OXYCODONE-ACETAMINOPHEN 5-325 MG PO TABS: 1.0000 | ORAL_TABLET | Freq: Three times a day (TID) | ORAL | 0 refills | Status: DC | PRN

## 2020-02-24 MED ORDER — ROPIVACAINE HCL 2 MG/ML IJ SOLN
INTRAMUSCULAR | Status: AC
  Filled 2020-02-24: qty 10

## 2020-02-24 MED ORDER — FENTANYL CITRATE (PF) 100 MCG/2ML IJ SOLN
25.0000 ug | INTRAMUSCULAR | Status: DC | PRN
  Administered 2020-02-24: 100 ug via INTRAVENOUS

## 2020-02-24 MED ORDER — LACTATED RINGERS IV SOLN
1000.0000 mL | Freq: Once | INTRAVENOUS | Status: AC
  Administered 2020-02-24: 1000 mL via INTRAVENOUS

## 2020-02-24 MED ORDER — MIDAZOLAM HCL 5 MG/5ML IJ SOLN
1.0000 mg | INTRAMUSCULAR | Status: DC | PRN
  Administered 2020-02-24: 2 mg via INTRAVENOUS

## 2020-02-24 MED ORDER — TRIAMCINOLONE ACETONIDE 40 MG/ML IJ SUSP
INTRAMUSCULAR | Status: AC
  Filled 2020-02-24: qty 1

## 2020-02-24 MED ORDER — MIDAZOLAM HCL 5 MG/5ML IJ SOLN
INTRAMUSCULAR | Status: AC
  Filled 2020-02-24: qty 5

## 2020-02-24 MED ORDER — LIDOCAINE HCL (PF) 2 % IJ SOLN
INTRAMUSCULAR | Status: AC
  Filled 2020-02-24: qty 5

## 2020-02-24 MED ORDER — FENTANYL CITRATE (PF) 100 MCG/2ML IJ SOLN
INTRAMUSCULAR | Status: AC
  Filled 2020-02-24: qty 2

## 2020-02-24 MED ORDER — ROPIVACAINE HCL 2 MG/ML IJ SOLN
9.0000 mL | Freq: Once | INTRAMUSCULAR | Status: AC
  Administered 2020-02-24: 10 mL via PERINEURAL

## 2020-02-24 MED ORDER — OXYCODONE-ACETAMINOPHEN 5-325 MG PO TABS: 1.0000 | ORAL_TABLET | Freq: Three times a day (TID) | ORAL | 0 refills | Status: AC | PRN

## 2020-02-24 MED ORDER — TRIAMCINOLONE ACETONIDE 40 MG/ML IJ SUSP
40.0000 mg | Freq: Once | INTRAMUSCULAR | Status: AC
  Administered 2020-02-24: 40 mg

## 2020-02-24 MED ORDER — LIDOCAINE HCL 2 % IJ SOLN
20.0000 mL | Freq: Once | INTRAMUSCULAR | Status: AC
  Administered 2020-02-24: 100 mg

## 2020-02-24 MED FILL — OXYCODONE-APAP 5-325MG: 5-325 | 7 days supply | Qty: 21 | Fill #0

## 2020-02-24 MED FILL — TESTOSTERONE CYP 200 MG/ML: 200 | 28 days supply | Qty: 2 | Fill #1

## 2020-02-24 NOTE — Patient Instructions (Signed)
___________________________________________________________________________________________  Post-Radiofrequency (RF) Discharge Instructions  You have just completed a Radiofrequency Neurotomy.  The following instructions will provide you with information and guidelines for self-care upon discharge.  If at any time you have questions or concerns please call your physician. DO NOT DRIVE YOURSELF!!  Instructions:  Apply ice: Fill a plastic sandwich bag with crushed ice. Cover it with a small towel and apply to injection site. Apply for 15 minutes then remove x 15 minutes. Repeat sequence on day of procedure, until you go to bed. The purpose is to minimize swelling and discomfort after procedure.  Apply heat: Apply heat to procedure site starting the day following the procedure. The purpose is to treat any soreness and discomfort from the procedure.  Food intake: No eating limitations, unless stipulated above.  Nevertheless, if you have had sedation, you may experience some nausea.  In this case, it may be wise to wait at least two hours prior to resuming regular diet.  Physical activities: Keep activities to a minimum for the first 8 hours after the procedure. For the first 24 hours after the procedure, do not drive a motor vehicle,  Operate heavy machinery, power tools, or handle any weapons.  Consider walking with the use of an assistive device or accompanied by an adult for the first 24 hours.  Do not drink alcoholic beverages including beer.  Do not make any important decisions or sign any legal documents. Go home and rest today.  Resume activities tomorrow, as tolerated.  Use caution in moving about as you may experience mild leg weakness.  Use caution in cooking, use of household electrical appliances and climbing steps.  Driving: If you have received any sedation, you are not allowed to drive for 24 hours after your procedure.  Blood thinner: Restart your blood thinner 6 hours after your  procedure. (Only for those taking blood thinners)  Insulin: As soon as you can eat, you may resume your normal dosing schedule. (Only for those taking insulin)  Medications: May resume pre-procedure medications.  Do not take any drugs, other than what has been prescribed to you.  Infection prevention: Keep procedure site clean and dry.  Post-procedure Pain Diary: Extremely important that this be done correctly and accurately. Recorded information will be used to determine the next step in treatment.  Pain evaluated is that of treated area only. Do not include pain from an untreated area.  Complete every hour, on the hour, for the initial 8 hours. Set an alarm to help you do this part accurately.  Do not go to sleep and have it completed later. It will not be accurate.  Follow-up appointment: Keep your follow-up appointment after the procedure. Usually 2-6 weeks after radiofrequency. Bring you pain diary. The information collected will be essential for your long-term care.   Expect:  From numbing medicine (AKA: Local Anesthetics): Numbness or decrease in pain.  Onset: Full effect within 15 minutes of injected.  Duration: It will depend on the type of local anesthetic used. On the average, 1 to 8 hours.   From steroids (when added): Decrease in swelling or inflammation. Once inflammation is improved, relief of the pain will follow.  Onset of benefits: Depends on the amount of swelling present. The more swelling, the longer it will take for the benefits to be seen. In some cases, up to 10 days.  Duration: Steroids will stay in the system x 2 weeks. Duration of benefits will depend on multiple posibilities including persistent irritating factors.  From procedure: Some discomfort is to be expected once the numbing medicine wears off. In the case of radiofrequency procedures, this may last as long as 6 weeks. Additional post-procedure pain medication is provided for this. Discomfort is  minimized if ice and heat are applied as instructed.  Call if:  You experience numbness and weakness that gets worse with time, as opposed to wearing off.  He experience any unusual bleeding, difficulty breathing, or loss of the ability to control your bowel and bladder. (This applies to Spinal procedures only)  You experience any redness, swelling, heat, red streaks, elevated temperature, fever, or any other signs of a possible infection.  Emergency Numbers:  [place] business hours (Monday - Thursday, 8:00 AM - 4:00 PM) (Friday, 9:00 AM - 12:00 Noon): [phone]  After hours: [phone] ____________________________________________________________________________________________

## 2020-02-24 NOTE — Progress Notes (Signed)
PROVIDER NOTE: Information contained herein reflects review and annotations entered in association with encounter. Interpretation of such information and data should be left to medically-trained personnel. Information provided to patient can be located elsewhere in the medical record under "Patient Instructions". Document created using STT-dictation technology, any transcriptional errors that may result from process are unintentional.    Patient: Chad Wilson  Service Category: Procedure  Provider: [name], MD  DOB: 09/06/1975  DOS: 02/24/2020  Location: [facility]  MRN: [id number]  Setting: Ambulatory - outpatient  Referring Provider: Dorothyann Peng, NP  Type: Established Patient  Specialty: Interventional Pain Management  PCP: Dorothyann Peng, NP   Primary Reason for Visit: Interventional Pain Management Treatment. CC: Back Pain (Left side)  Procedure:          Anesthesia, Analgesia, Anxiolysis:  Type: Thermal Lumbar Facet, Medial Branch Radiofrequency Ablation/Neurotomy  #2  Primary Purpose: Therapeutic Region: Posterolateral Lumbosacral Spine Level: L2, L3, L4, L5, & S1 Medial Branch Level(s). These levels will denervate the L3-4, L4-5, and the L5-S1 lumbar facet joints. Laterality: Left  Type: Moderate (Conscious) Sedation combined with Local Anesthesia Indication(s): Analgesia and Anxiety Route: Intravenous (IV) IV Access: Secured Sedation: Meaningful verbal contact was maintained at all times during the procedure  Local Anesthetic: Lidocaine 1-2%  Position: Prone   Indications: 1. Lumbar facet syndrome (Bilateral)   2. Spondylosis without myelopathy or radiculopathy, lumbosacral region   3. Lumbar facet hypertrophy (Bilateral)   4. Grade 1 Lumbosacral Retrolisthesis of L5/S1   5. DDD (degenerative disc disease), lumbosacral   6. Chronic low back pain (Primary area of Pain) (Bilateral) (R=L) w/o sciatica   7. Osteoarthritis of facet joint of lumbar spine     Mr. Chisolm has been dealing with the above chronic pain for longer than three months and has either failed to respond, was unable to tolerate, or simply did not get enough benefit from other more conservative therapies including, but not limited to: 1. Over-the-counter medications 2. Anti-inflammatory medications 3. Muscle relaxants 4. Membrane stabilizers 5. Opioids 6. Physical therapy and/or chiropractic manipulation 7. Modalities (Heat, ice, etc.) 8. Invasive techniques such as nerve blocks. Mr. Crate has attained more than 50% relief of the pain from a series of diagnostic injections conducted in separate occasions.  Pain Score: Pre-procedure: 5 /10 Post-procedure: 0-No pain/10  Pre-op H&P Assessment:  Mr. Wedel is a 44 y.o. (year old), male patient, seen today for interventional treatment. He  has a past surgical history that includes Multiple tooth extractions (15 yrs. ago); Anterior cervical decomp/discectomy fusion (N/A, 05/21/2017); and Wisdom tooth extraction. Mr. Piech has a current medication list which includes the following prescription(s): albuterol, alprazolam, atorvastatin, buspirone, colchicine, depo-testosterone, [START ON 02/29/2020] hydrocodone-acetaminophen, [START ON 03/30/2020] hydrocodone-acetaminophen, [START ON 04/29/2020] hydrocodone-acetaminophen, levothyroxine, lisinopril, nabumetone, nitroglycerin, pantoprazole, [START ON 02/29/2020] pregabalin, sertraline, sildenafil, oxycodone-acetaminophen, [START ON 03/02/2020] oxycodone-acetaminophen, and [DISCONTINUED] omeprazole, and the following Facility-Administered Medications: fentanyl and midazolam. His primarily concern today is the Back Pain (Left side)  Initial Vital Signs:  Pulse/HCG Rate: 69ECG Heart Rate: 74 Temp: (!) 97.3 F (36.3 C) Resp: 18 BP: (!) 144/96 SpO2: 97 %  BMI: Estimated body mass index is 33.41 kg/m as calculated from the following:   Height as of this encounter: 5' 6"  (1.676 m).   Weight as of  this encounter: 207 lb (93.9 kg).  Risk Assessment: Allergies: Reviewed. He is allergic to chantix [varenicline].  Allergy Precautions: None required Coagulopathies: Reviewed. None identified.  Blood-thinner therapy: None at this time  Active Infection(s): Reviewed. None identified. Mr. Woodin is afebrile  Site Confirmation: Mr. Tweed was asked to confirm the procedure and laterality before marking the site Procedure checklist: Completed Consent: Before the procedure and under the influence of no sedative(s), amnesic(s), or anxiolytics, the patient was informed of the treatment options, risks and possible complications. To fulfill our ethical and legal obligations, as recommended by the American Medical Association's Code of Ethics, I have informed the patient of my clinical impression; the nature and purpose of the treatment or procedure; the risks, benefits, and possible complications of the intervention; the alternatives, including doing nothing; the risk(s) and benefit(s) of the alternative treatment(s) or procedure(s); and the risk(s) and benefit(s) of doing nothing. The patient was provided information about the general risks and possible complications associated with the procedure. These may include, but are not limited to: failure to achieve desired goals, infection, bleeding, organ or nerve damage, allergic reactions, paralysis, and death. In addition, the patient was informed of those risks and complications associated to Spine-related procedures, such as failure to decrease pain; infection (i.e.: Meningitis, epidural or intraspinal abscess); bleeding (i.e.: epidural hematoma, subarachnoid hemorrhage, or any other type of intraspinal or peri-dural bleeding); organ or nerve damage (i.e.: Any type of peripheral nerve, nerve root, or spinal cord injury) with subsequent damage to sensory, motor, and/or autonomic systems, resulting in permanent pain, numbness, and/or weakness of one or several areas of  the body; allergic reactions; (i.e.: anaphylactic reaction); and/or death. Furthermore, the patient was informed of those risks and complications associated with the medications. These include, but are not limited to: allergic reactions (i.e.: anaphylactic or anaphylactoid reaction(s)); adrenal axis suppression; blood sugar elevation that in diabetics may result in ketoacidosis or comma; water retention that in patients with history of congestive heart failure may result in shortness of breath, pulmonary edema, and decompensation with resultant heart failure; weight gain; swelling or edema; medication-induced neural toxicity; particulate matter embolism and blood vessel occlusion with resultant organ, and/or nervous system infarction; and/or aseptic necrosis of one or more joints. Finally, the patient was informed that Medicine is not an exact science; therefore, there is also the possibility of unforeseen or unpredictable risks and/or possible complications that may result in a catastrophic outcome. The patient indicated having understood very clearly. We have given the patient no guarantees and we have made no promises. Enough time was given to the patient to ask questions, all of which were answered to the patient's satisfaction. [name] has indicated that he wanted to continue with the procedure. Attestation: I, the ordering provider, attest that I have discussed with the patient the benefits, risks, side-effects, alternatives, likelihood of achieving goals, and potential problems during recovery for the procedure that I have provided informed consent. Date  Time: 02/24/2020 12:48 PM  Pre-Procedure Preparation:  Monitoring: As per clinic protocol. Respiration, ETCO2, SpO2, BP, heart rate and rhythm monitor placed and checked for adequate function Safety Precautions: Patient was assessed for positional comfort and pressure points before starting the procedure. Time-out: I initiated and conducted the  "Time-out" before starting the procedure, as per protocol. The patient was asked to participate by confirming the accuracy of the "Time Out" information. Verification of the correct person, site, and procedure were performed and confirmed by me, the nursing staff, and the patient. "Time-out" conducted as per Joint Commission's Universal Protocol (UP.01.01.01). Time: 1341  Description of Procedure:          Laterality: Left Levels:  L2, L3, L4, L5, & S1  Medial Branch Level(s), at the L3-4, L4-5, and the L5-S1 lumbar facet joints. Area Prepped: Lumbosacral DuraPrep (Iodine Povacrylex [0.7% available iodine] and Isopropyl Alcohol, 74% w/w) Safety Precautions: Aspiration looking for blood return was conducted prior to all injections. At no point did we inject any substances, as a needle was being advanced. Before injecting, the patient was told to immediately notify me if he was experiencing any new onset of "ringing in the ears, or metallic taste in the mouth". No attempts were made at seeking any paresthesias. Safe injection practices and needle disposal techniques used. Medications properly checked for expiration dates. SDV (single dose vial) medications used. After the completion of the procedure, all disposable equipment used was discarded in the proper designated medical waste containers. Local Anesthesia: Protocol guidelines were followed. The patient was positioned over the fluoroscopy table. The area was prepped in the usual manner. The time-out was completed. The target area was identified using fluoroscopy. A 12-in long, straight, sterile hemostat was used with fluoroscopic guidance to locate the targets for each level blocked. Once located, the skin was marked with an approved surgical skin marker. Once all sites were marked, the skin (epidermis, dermis, and hypodermis), as well as deeper tissues (fat, connective tissue and muscle) were infiltrated with a small amount of a short-acting local  anesthetic, loaded on a 10cc syringe with a 25G, 1.5-in  Needle. An appropriate amount of time was allowed for local anesthetics to take effect before proceeding to the next step. Local Anesthetic: Lidocaine 2.0% The unused portion of the local anesthetic was discarded in the proper designated containers. Technical explanation of process:  Radiofrequency Ablation (RFA) L2 Medial Branch Nerve RFA: The target area for the L2 medial branch is at the junction of the postero-lateral aspect of the superior articular process and the superior, posterior, and medial edge of the transverse process of L3. Under fluoroscopic guidance, a Radiofrequency needle was inserted until contact was made with os over the superior postero-lateral aspect of the pedicular shadow (target area). Sensory and motor testing was conducted to properly adjust the position of the needle. Once satisfactory placement of the needle was achieved, the numbing solution was slowly injected after negative aspiration for blood. 2.0 mL of the nerve block solution was injected without difficulty or complication. After waiting for at least 3 minutes, the ablation was performed. Once completed, the needle was removed intact. L3 Medial Branch Nerve RFA: The target area for the L3 medial branch is at the junction of the postero-lateral aspect of the superior articular process and the superior, posterior, and medial edge of the transverse process of L4. Under fluoroscopic guidance, a Radiofrequency needle was inserted until contact was made with os over the superior postero-lateral aspect of the pedicular shadow (target area). Sensory and motor testing was conducted to properly adjust the position of the needle. Once satisfactory placement of the needle was achieved, the numbing solution was slowly injected after negative aspiration for blood. 2.0 mL of the nerve block solution was injected without difficulty or complication. After waiting for at least 3  minutes, the ablation was performed. Once completed, the needle was removed intact. L4 Medial Branch Nerve RFA: The target area for the L4 medial branch is at the junction of the postero-lateral aspect of the superior articular process and the superior, posterior, and medial edge of the transverse process of L5. Under fluoroscopic guidance, a Radiofrequency needle was inserted until contact was made with os over the superior postero-lateral aspect of the pedicular  shadow (target area). Sensory and motor testing was conducted to properly adjust the position of the needle. Once satisfactory placement of the needle was achieved, the numbing solution was slowly injected after negative aspiration for blood. 2.0 mL of the nerve block solution was injected without difficulty or complication. After waiting for at least 3 minutes, the ablation was performed. Once completed, the needle was removed intact. L5 Medial Branch Nerve RFA: The target area for the L5 medial branch is at the junction of the postero-lateral aspect of the superior articular process of S1 and the superior, posterior, and medial edge of the sacral ala. Under fluoroscopic guidance, a Radiofrequency needle was inserted until contact was made with os over the superior postero-lateral aspect of the pedicular shadow (target area). Sensory and motor testing was conducted to properly adjust the position of the needle. Once satisfactory placement of the needle was achieved, the numbing solution was slowly injected after negative aspiration for blood. 2.0 mL of the nerve block solution was injected without difficulty or complication. After waiting for at least 3 minutes, the ablation was performed. Once completed, the needle was removed intact. S1 Medial Branch Nerve RFA: The target area for the S1 medial branch is located inferior to the junction of the S1 superior articular process and the L5 inferior articular process, posterior, inferior, and lateral to the  6 o'clock position of the L5-S1 facet joint, just superior to the S1 posterior foramen. Under fluoroscopic guidance, the Radiofrequency needle was advanced until contact was made with os over the Target area. Sensory and motor testing was conducted to properly adjust the position of the needle. Once satisfactory placement of the needle was achieved, the numbing solution was slowly injected after negative aspiration for blood. 2.0 mL of the nerve block solution was injected without difficulty or complication. After waiting for at least 3 minutes, the ablation was performed. Once completed, the needle was removed intact. Radiofrequency lesioning (ablation):  Radiofrequency Generator: NeuroTherm [id number] Sensory Stimulation Parameters: 50 Hz was used to locate & identify the nerve, making sure that the needle was positioned such that there was no sensory stimulation below 0.3 V or above 0.7 V. Motor Stimulation Parameters: 2 Hz was used to evaluate the motor component. Care was taken not to lesion any nerves that demonstrated motor stimulation of the lower extremities at an output of less than 2.5 times that of the sensory threshold, or a maximum of 2.0 V. Lesioning Technique Parameters: Standard Radiofrequency settings. (Not bipolar or pulsed.) Temperature Settings: 80 degrees C Lesioning time: 60 seconds Intra-operative Compliance: Compliant Materials & Medications: Needle(s) (Electrode/Cannula) Type: Teflon-coated, curved tip, Radiofrequency needle(s) Gauge: 22G Length: 10cm Numbing solution: 0.2% PF-Ropivacaine + Triamcinolone (40 mg/mL) diluted to a final concentration of 4 mg of Triamcinolone/mL of Ropivacaine The unused portion of the solution was discarded in the proper designated containers.  Once the entire procedure was completed, the treated area was cleaned, making sure to leave some of the prepping solution back to take advantage of its long term bactericidal properties.  Illustration of  the posterior view of the lumbar spine and the posterior neural structures. Laminae of L2 through S1 are labeled. DPRL5, dorsal primary ramus of L5; DPRS1, dorsal primary ramus of S1; DPR3, dorsal primary ramus of L3; FJ, facet (zygapophyseal) joint L3-L4; I, inferior articular process of L4; LB1, lateral branch of dorsal primary ramus of L1; IAB, inferior articular branches from L3 medial branch (supplies L4-L5 facet joint); IBP, intermediate branch plexus; MB3, medial branch  of dorsal primary ramus of L3; NR3, third lumbar nerve root; S, superior articular process of L5; SAB, superior articular branches from L4 (supplies L4-5 facet joint also); TP3, transverse process of L3.  Vitals:   02/24/20 1411 02/24/20 1420 02/24/20 1430 02/24/20 1440  BP: (!) 141/86 139/77 133/80 (!) 141/95  Pulse:      Resp: 13 12 14 14   Temp:  (!) 97.2 F (36.2 C)  (!) 97.5 F (36.4 C)  TempSrc:  Temporal  Temporal  SpO2: 90% 95% 95% 100%  Weight:      Height:       Start Time: 1341 hrs. End Time:   hrs.  Imaging Guidance (Spinal):          Type of Imaging Technique: Fluoroscopy Guidance (Spinal) Indication(s): Assistance in needle guidance and placement for procedures requiring needle placement in or near specific anatomical locations not easily accessible without such assistance. Exposure Time: Please see nurses notes. Contrast: None used. Fluoroscopic Guidance: I was personally present during the use of fluoroscopy. "Tunnel Vision Technique" used to obtain the best possible view of the target area. Parallax error corrected before commencing the procedure. "Direction-depth-direction" technique used to introduce the needle under continuous pulsed fluoroscopy. Once target was reached, antero-posterior, oblique, and lateral fluoroscopic projection used confirm needle placement in all planes. Images permanently stored in EMR. Interpretation: No contrast injected. I personally interpreted the imaging intraoperatively.  Adequate needle placement confirmed in multiple planes. Permanent images saved into the patient's record.  Antibiotic Prophylaxis:   Anti-infectives (From admission, onward)   None     Indication(s): None identified  Post-operative Assessment:  Post-procedure Vital Signs:  Pulse/HCG Rate: 6972 Temp: (!) 97.5 F (36.4 C) Resp: 14 BP: (!) 141/95 SpO2: 100 %  EBL: None  Complications: No immediate post-treatment complications observed by team, or reported by patient.  Note: The patient tolerated the entire procedure well. A repeat set of vitals were taken after the procedure and the patient was kept under observation following institutional policy, for this type of procedure. Post-procedural neurological assessment was performed, showing return to baseline, prior to discharge. The patient was provided with post-procedure discharge instructions, including a section on how to identify potential problems. Should any problems arise concerning this procedure, the patient was given instructions to immediately contact us, at any time, without hesitation. In any case, we plan to contact the patient by telephone for a follow-up status report regarding this interventional procedure.  Comments:  No additional relevant information.  Plan of Care  Orders:  Orders Placed This Encounter  Procedures  . Radiofrequency,Lumbar    Scheduling Instructions:     Side(s): Left-sided     Level: L3-4, L4-5, & L5-S1 Facets (L2, L3, L4, L5, & S1 Medial Branch Nerves)     Sedation: Patient's choice.     Timeframe: Today    Order Specific Question:   Where will this procedure be performed?    Answer:   [facility]  . [facility] C-ARM 1-60 MIN NO REPORT    Intraoperative interpretation by procedural physician at [place].    Standing Status:   Standing    Number of Occurrences:   1    Order Specific Question:   Reason for exam:    Answer:   Assistance in needle guidance and  placement for procedures requiring needle placement in or near specific anatomical locations not easily accessible without such assistance.  . Informed Consent Details: Physician/Practitioner Attestation; Transcribe to consent  form and obtain patient signature    Nursing Order: Transcribe to consent form and obtain patient signature. Note: Always confirm laterality of pain with Mr. Rakestraw, before procedure.    Order Specific Question:   Physician/Practitioner attestation of informed consent for procedure/surgical case    Answer:   I, the physician/practitioner, attest that I have discussed with the patient the benefits, risks, side effects, alternatives, likelihood of achieving goals and potential problems during recovery for the procedure that I have provided informed consent.    Order Specific Question:   Procedure    Answer:   Lumbar Facet Radiofrequency Ablation    Order Specific Question:   Physician/Practitioner performing the procedure    Answer:   [place] Natarajan A. Dossie Arbour, MD    Order Specific Question:   Indication/Reason    Answer:   Low Back Pain, with our without leg pain, due to Facet Joint Arthralgia (Joint Pain) known as Lumbar Facet Syndrome, secondary to Lumbar, and/or Lumbosacral Spondylosis (Arthritis of the Spine), without myelopathy or radiculopathy (Nerve Damage).  . Provide equipment / supplies at bedside    "Radiofrequency Tray"; Large hemostat (x1); Small hemostat (x1); Towels (x8); 4x4 sterile sponge pack (x1) Needle type: Teflon-coated Radiofrequency Needle (Disposable  single use) Size: Regular Quantity: 5    Standing Status:   Standing    Number of Occurrences:   1    Order Specific Question:   Specify    Answer:   Radiofrequency Tray   Chronic Opioid Analgesic:  Oxycodone IR 5 mg, 1 tab PO q 6 hrs (20 mg/day of oxycodone) MME/day: 30 mg/day.   Medications ordered for procedure: Meds ordered this encounter  Medications  . oxyCODONE-acetaminophen (PERCOCET) 5-325 MG  tablet    Sig: Take 1 tablet by mouth every 8 (eight) hours as needed for up to 7 days for severe pain. Must last 7 days.    Dispense:  21 tablet    Refill:  0    For acute post-operative pain. Not to be refilled.  Must last 7 days.  Marland Kitchen oxyCODONE-acetaminophen (PERCOCET) 5-325 MG tablet    Sig: Take 1 tablet by mouth every 8 (eight) hours as needed for up to 7 days for severe pain. Must last 7 days.    Dispense:  21 tablet    Refill:  0    For acute post-operative pain. Not to be refilled.  Must last 7 days.  Marland Kitchen lidocaine (XYLOCAINE) 2 % (with pres) injection 400 mg  . lactated ringers infusion 1,000 mL  . midazolam (VERSED) 5 MG/5ML injection 1-2 mg    Make sure Flumazenil is available in the pyxis when using this medication. If oversedation occurs, administer 0.2 mg IV over 15 sec. If after 45 sec no response, administer 0.2 mg again over 1 min; may repeat at 1 min intervals; not to exceed 4 doses (1 mg)  . fentaNYL (SUBLIMAZE) injection 25-50 mcg    Make sure Narcan is available in the pyxis when using this medication. In the event of respiratory depression (RR< 8/min): Titrate NARCAN (naloxone) in increments of 0.1 to 0.2 mg IV at 2-3 minute intervals, until desired degree of reversal.  . ropivacaine (PF) 2 mg/mL (0.2%) (NAROPIN) injection 9 mL  . triamcinolone acetonide (KENALOG-40) injection 40 mg   Medications administered: We administered lidocaine, lactated ringers, midazolam, fentaNYL, ropivacaine (PF) 2 mg/mL (0.2%), and triamcinolone acetonide.  See the medical record for exact dosing, route, and time of administration.  Follow-up plan:   Return in about  6 weeks (around 04/06/2020) for (F2F), (PP) Follow-up.       Considering:   Therapeutic bilateral lumbar facet RFA #2  (we will start on the right side)   PRN Procedures:   Palliative/therapeutic bilateral lumbar facet block #4  Palliative right lumbar facet RFA #2 (last done 12/10/2018) (100/100/100/100) Palliative left  lumbar facet RFA #2 (last done 01/05/2019) (100/100/50/90-100)    Recent Visits Date Type Provider Dept  01/18/20 Procedure visit Milinda Pointer, MD [facility]  12/01/19 Office Visit Milinda Pointer, MD [facility]  Showing recent visits within past 90 days and meeting all other requirements Today's Visits Date Type Provider Dept  02/24/20 Procedure visit Milinda Pointer, MD [facility]  Showing today's visits and meeting all other requirements Future Appointments Date Type Provider Dept  04/05/20 Appointment Milinda Pointer, MD [facility]  Showing future appointments within next 90 days and meeting all other requirements  Disposition: Discharge home  Discharge (Date  Time): 02/24/2020; 1443 hrs.   Primary Care Physician: Dorothyann Peng, NP Location: [facility] Note by: [name], MD Date: 02/24/2020; Time: 3:05 PM  Disclaimer:  Medicine is not an Chief Strategy Officer. The only guarantee in medicine is that nothing is guaranteed. It is important to note that the decision to proceed with this intervention was based on the information collected from the patient. The Data and conclusions were drawn from the patient's questionnaire, the interview, and the physical examination. Because the information was provided in large part by the patient, it cannot be guaranteed that it has not been purposely or unconsciously manipulated. Every effort has been made to obtain as much relevant data as possible for this evaluation. It is important to note that the conclusions that lead to this procedure are derived in large part from the available data. Always take into account that the treatment will also be dependent on availability of resources and existing treatment guidelines, considered by other Pain Management Practitioners as being common knowledge and practice, at the time of the intervention. For Medico-Legal  purposes, it is also important to point out that variation in procedural techniques and pharmacological choices are the acceptable norm. The indications, contraindications, technique, and results of the above procedure should only be interpreted and judged by a Board-Certified Interventional Pain Specialist with extensive familiarity and expertise in the same exact procedure and technique.

## 2020-02-24 NOTE — Progress Notes (Signed)
Safety precautions to be maintained throughout the outpatient stay will include: orient to surroundings, keep bed in low position, maintain call bell within reach at all times, provide assistance with transfer out of bed and ambulation.  

## 2020-02-25 ENCOUNTER — Telehealth: Payer: Self-pay | Admitting: *Deleted

## 2020-02-25 NOTE — Telephone Encounter (Signed)
No problems post procedure. 

## 2020-02-28 ENCOUNTER — Telehealth: Payer: Self-pay | Admitting: Pain Medicine

## 2020-02-28 NOTE — Telephone Encounter (Signed)
Having trouble getting script filled, would like nurse to call pharmacy and speak with Jinny Blossom, then let him know status.

## 2020-02-28 NOTE — Telephone Encounter (Signed)
Script is DO NOT FILL UNTIL 02/29/2020. Patient called and  I explained  this to him. Can fill tomorrow.

## 2020-02-29 MED FILL — HYDROCODON-APAP 7.5-325: 7.5-325 | 30 days supply | Qty: 120 | Fill #0

## 2020-03-03 MED FILL — ALPRAZolam 0.5 MG TABS: 0.5 | 30 days supply | Qty: 60 | Fill #2

## 2020-03-06 MED FILL — PANTOPRAZOLE SOD DR 40 MG T: 40 | 30 days supply | Qty: 30 | Fill #6

## 2020-03-06 MED FILL — OXYCODONE-APAP 5-325MG: 5-325 | 7 days supply | Qty: 21 | Fill #0

## 2020-03-28 ENCOUNTER — Other Ambulatory Visit: Payer: Self-pay | Admitting: Adult Health

## 2020-03-28 ENCOUNTER — Other Ambulatory Visit: Payer: Self-pay | Admitting: Gastroenterology

## 2020-03-28 DIAGNOSIS — F419 Anxiety disorder, unspecified: Secondary | ICD-10-CM

## 2020-03-28 MED FILL — ATORVASTATIN 40 MG TABLET: 40 | 90 days supply | Qty: 90 | Fill #2

## 2020-03-28 MED FILL — PREGABALIN 225 MG CAPS: 225 | 30 days supply | Qty: 60 | Fill #1

## 2020-03-29 ENCOUNTER — Other Ambulatory Visit: Payer: Self-pay | Admitting: Adult Health

## 2020-03-29 MED FILL — busPIRone HCL 5 MG TABS: 5 | 90 days supply | Qty: 180 | Fill #0

## 2020-03-29 NOTE — Telephone Encounter (Signed)
SENT TO THE PHARMACY BY E-SCRIBE. 

## 2020-03-31 ENCOUNTER — Other Ambulatory Visit: Payer: Self-pay | Admitting: Pain Medicine

## 2020-03-31 DIAGNOSIS — G894 Chronic pain syndrome: Secondary | ICD-10-CM

## 2020-03-31 DIAGNOSIS — G8918 Other acute postprocedural pain: Secondary | ICD-10-CM

## 2020-03-31 MED FILL — HYDROCODON-APAP 7.5-325: 7.5-325 | 30 days supply | Qty: 120 | Fill #0

## 2020-04-04 NOTE — Progress Notes (Signed)
Patient: Chad Wilson  Service Category: E/M  Provider: [name], MD  DOB: December 26, 1975  DOS: 04/05/2020  Location: Office  MRN: [id number]  Setting: Ambulatory outpatient  Referring Provider: Dorothyann Peng, NP  Type: Established Patient  Specialty: Interventional Pain Management  PCP: Dorothyann Peng, NP  Location: Remote location  Delivery: TeleHealth     Virtual Encounter - Pain Management PROVIDER NOTE: Information contained herein reflects review and annotations entered in association with encounter. Interpretation of such information and data should be left to medically-trained personnel. Information provided to patient can be located elsewhere in the medical record under "Patient Instructions". Document created using STT-dictation technology, any transcriptional errors that may result from process are unintentional.    Contact & Pharmacy Preferred: [phone] Home: [phone] (home) Mobile: [phone] (mobile) E-mail: [email or web address]  [place], [place]. [address] [place] [address] Phone: [phone] Fax: [phone]   Pre-screening  [name] offered "in-person" vs "virtual" encounter. He indicated preferring virtual for this encounter.   Reason COVID-19*  Social distancing based on CDC and AMA recommendations.   I contacted Chad Wilson on 04/05/2020 via telephone.      I clearly identified myself as [name], MD. I verified that I was speaking with the correct person using two identifiers (Name: Chad Wilson, and date of birth: December 08, 1975).  Consent I sought verbal advanced consent from Chad Wilson for virtual visit interactions. I informed Chad Wilson of possible security and privacy concerns, risks, and limitations associated with providing "not-in-person" medical evaluation and management services. I also informed Chad Wilson of the availability of "in-person" appointments. Finally, I  informed him that there would be a charge for the virtual visit and that he could be  personally, fully or partially, financially responsible for it. Chad Wilson expressed understanding and agreed to proceed.   Historic Elements   Chad Wilson is a 45 y.o. year old, male patient evaluated today after our last contact on 03/31/2020. Chad Wilson  has a past medical history of Acute hepatitis C without mention of hepatic coma(070.51), Acute postoperative pain (12/10/2018), Alcohol abuse, unspecified, Anxiety and depression, Anxiety states, Essential hypertension, benign, GERD (gastroesophageal reflux disease), Graves disease, Panic disorder without agoraphobia, Thyroid disease, and Tobacco use disorder. He also  has a past surgical history that includes Multiple tooth extractions (15 yrs. ago); Anterior cervical decomp/discectomy fusion (N/A, 05/21/2017); and Wisdom tooth extraction. Chad Wilson has a current medication list which includes the following prescription(s): albuterol, alprazolam, atorvastatin, buspirone, colchicine, depo-testosterone, hydrocodone-acetaminophen, [START ON 04/29/2020] hydrocodone-acetaminophen, levothyroxine, lisinopril, nabumetone, nitroglycerin, pantoprazole, pregabalin, sildenafil, hydrocodone-acetaminophen, sertraline, and [DISCONTINUED] omeprazole. He  reports that he has been smoking cigarettes. He has a 30.00 pack-year smoking history. His smokeless tobacco use includes chew. He reports current alcohol use of about 42.0 standard drinks of alcohol per week. He reports previous drug use. Chad Wilson is allergic to chantix [varenicline].   HPI  Today, he is being contacted for a post-procedure assessment.  On 01/18/2020 the patient had the radiofrequency ablation done on the right side which has provided him with 100% relief of the low back pain on that side.  The left side was done on 02/24/2020 and so far it has provided him with 50% relief.  Although he has not been as good as the right side, it  still good enough that he refers not needing anything else at this point.  We will see him back before 05/29/2020 for his  medication refill.  RTCB: 05/29/2020 Nonopioids transferred 01/18/2020: Lyrica  Post-Procedure Evaluation  Procedure (02/24/2020): Therapeutic left lumbar facet RFA #2 under fluoroscopic guidance and IV sedation Pre-procedure pain level: 5/10 Post-procedure: 0/10 (100% relief)  Sedation: Sedation provided.  Effectiveness during initial hour after procedure(Ultra-Short Term Relief): 100 %.  Local anesthetic used: Long-acting (4-6 hours) Effectiveness: Defined as any analgesic benefit obtained secondary to the administration of local anesthetics. This carries significant diagnostic value as to the etiological location, or anatomical origin, of the pain. Duration of benefit is expected to coincide with the duration of the local anesthetic used.  Effectiveness during initial 4-6 hours after procedure(Short-Term Relief): 100 %.  Long-term benefit: Defined as any relief past the pharmacologic duration of the local anesthetics.  Effectiveness past the initial 6 hours after procedure(Long-Term Relief): 50 %.  Current benefits: Defined as benefit that persist at this time.   Analgesia:  50% improved on the left side but 100% on the right. Function: Chad Wilson reports improvement in function ROM: Chad Wilson reports improvement in ROM  Pharmacotherapy Assessment  Analgesic: Oxycodone IR 5 mg, 1 tab PO q 6 hrs (20 mg/day of oxycodone) MME/day: 30 mg/day.   Monitoring: [place] PMP: PDMP reviewed during this encounter.       Pharmacotherapy: No side-effects or adverse reactions reported. Compliance: No problems identified. Effectiveness: Clinically acceptable. Plan: Refer to "POC".  UDS:  Summary  Date Value Ref Range Status  08/23/2019 Note  Final    Comment:    ==================================================================== [employer] Select 13  (MW) ==================================================================== Test                             Result       Flag       Units  Drug Present and Declared for Prescription Verification   7-aminoclonazepam              36           EXPECTED   ng/mg creat    7-aminoclonazepam is an expected metabolite of clonazepam. Source of    clonazepam is a scheduled prescription medication.    Oxycodone                      427          EXPECTED   ng/mg creat   Oxymorphone                    665          EXPECTED   ng/mg creat   Noroxycodone                   1768         EXPECTED   ng/mg creat   Noroxymorphone                 429          EXPECTED   ng/mg creat    Sources of oxycodone are scheduled prescription medications.    Oxymorphone, noroxycodone, and noroxymorphone are expected    metabolites of oxycodone. Oxymorphone is also available as a    scheduled prescription medication.  Drug Present not Declared for Prescription Verification   Alprazolam                     29           UNEXPECTED ng/mg creat  Alpha-hydroxyalprazolam        49           UNEXPECTED ng/mg creat    Source of alprazolam is a scheduled prescription medication. Alpha-    hydroxyalprazolam is an expected metabolite of alprazolam.  ==================================================================== Test                      Result    Flag   Units      Ref Range   Creatinine              146              mg/dL      >=20 ==================================================================== Declared Medications:  The flagging and interpretation on this report are based on the  following declared medications.  Unexpected results may arise from  inaccuracies in the declared medications.   **Note: The testing scope of this panel includes these medications:   Clonazepam (Klonopin)  Oxycodone (Roxicodone)   **Note: The testing scope of this panel does not include the  following reported medications:   Albuterol  (Ventolin HFA)  Atorvastatin (Lipitor)  Buspirone (Buspar)  Colchicine  Levothyroxine (Synthroid)  Lisinopril (Zestril)  Omeprazole (Prilosec)  Pantoprazole (Protonix)  Pregabalin (Lyrica)  Sertraline (Zoloft)  Sildenafil  Testosterone ==================================================================== For clinical consultation, please call [phone]. ====================================================================     Laboratory Chemistry Profile   Renal Lab Results  Component Value Date   BUN 13 09/02/2019   CREATININE 0.92 09/02/2019   BCR 8 (L) 06/03/2018   GFR 88.79 09/02/2019   GFRAA >60 10/05/2018   GFRNONAA >60 10/05/2018     Hepatic Lab Results  Component Value Date   AST 20 09/02/2019   ALT 43 09/02/2019   ALBUMIN 4.5 09/02/2019   ALKPHOS 83 09/02/2019   HCVAB (A) 09/29/2009    Reactive (NOTE) Result repeated and verified.                                                                       This test is for screening purposes only.  Reactive results should be confirmed by an alternative method.  Suggest HCV Qualitative, PCR, test code  83130.  Specimens will be stable for reflex testing up to 3 days after collection.   LIPASE 26 10/05/2018     Electrolytes Lab Results  Component Value Date   NA 139 09/02/2019   K 3.6 09/02/2019   CL 102 09/02/2019   CALCIUM 9.3 09/02/2019   MG 2.1 06/03/2018     Bone Lab Results  Component Value Date   VD25OH 21.01 (L) 02/17/2018   TESTOFREE See below 02/17/2018   TESTOSTERONE 147 (L) 02/17/2018     Inflammation (CRP: Acute Phase) (ESR: Chronic Phase) Lab Results  Component Value Date   CRP 1 06/03/2018   ESRSEDRATE 21 (H) 06/03/2018       Note: Above Lab results reviewed.  Imaging  DG PAIN CLINIC C-ARM 1-60 MIN NO REPORT Fluoro was used, but no Radiologist interpretation will be provided.  Please refer to "NOTES" tab for provider progress note.  Assessment  The primary encounter  diagnosis was Chronic pain syndrome. Diagnoses of Lumbar facet syndrome (Bilateral), Spondylosis without myelopathy or radiculopathy, lumbosacral region, Chronic  low back pain (Primary area of Pain) (Bilateral) (R=L) w/o sciatica, Grade 1 Lumbosacral Retrolisthesis of L5/S1, Pharmacologic therapy, and Uncomplicated opioid dependence ([place]) were also pertinent to this visit.  Plan of Care  Problem-specific:  No problem-specific Assessment & Plan notes found for this encounter.  [name] has a current medication list which includes the following long-term medication(s): albuterol, atorvastatin, colchicine, depo-testosterone, hydrocodone-acetaminophen, hydrocodone-acetaminophen, [START ON 04/29/2020] hydrocodone-acetaminophen, levothyroxine, lisinopril, nitroglycerin, pantoprazole, pregabalin, sertraline, and [DISCONTINUED] omeprazole.  Pharmacotherapy (Medications Ordered): No orders of the defined types were placed in this encounter.  Orders:  No orders of the defined types were placed in this encounter.  Follow-up plan:   Return in about 8 weeks (around 05/29/2020) for (F2F), (Med Mgmt).      Considering:   Therapeutic bilateral lumbar facet RFA #2  (we will start on the right side)   PRN Procedures:   Palliative/therapeutic bilateral lumbar facet block #4  Palliative right lumbar facet RFA #2 (last done 12/10/2018) (100/100/100/100) Palliative left lumbar facet RFA #2 (last done 01/05/2019) (100/100/50/90-100)     Recent Visits Date Type Provider Dept  02/24/20 Procedure visit Milinda Pointer, MD [facility]  01/18/20 Procedure visit Milinda Pointer, MD [facility]  Showing recent visits within past 90 days and meeting all other requirements Today's Visits Date Type Provider Dept  04/05/20 Telemedicine Milinda Pointer, MD [facility]  Showing today's visits and meeting all other requirements Future Appointments No visits were found meeting  these conditions. Showing future appointments within next 90 days and meeting all other requirements  I discussed the assessment and treatment plan with the patient. The patient was provided an opportunity to ask questions and all were answered. The patient agreed with the plan and demonstrated an understanding of the instructions.  Patient advised to call back or seek an in-person evaluation if the symptoms or condition worsens.  Duration of encounter: 12 minutes.  Note by: [name], MD Date: 04/05/2020; Time: 4:00 PM

## 2020-04-05 ENCOUNTER — Ambulatory Visit: Payer: 59 | Attending: Pain Medicine | Admitting: Pain Medicine

## 2020-04-05 ENCOUNTER — Other Ambulatory Visit: Payer: Self-pay

## 2020-04-05 DIAGNOSIS — M47817 Spondylosis without myelopathy or radiculopathy, lumbosacral region: Secondary | ICD-10-CM

## 2020-04-05 DIAGNOSIS — G8929 Other chronic pain: Secondary | ICD-10-CM | POA: Diagnosis not present

## 2020-04-05 DIAGNOSIS — F1120 Opioid dependence, uncomplicated: Secondary | ICD-10-CM

## 2020-04-05 DIAGNOSIS — Z79899 Other long term (current) drug therapy: Secondary | ICD-10-CM

## 2020-04-05 DIAGNOSIS — G894 Chronic pain syndrome: Secondary | ICD-10-CM

## 2020-04-05 DIAGNOSIS — M545 Low back pain, unspecified: Secondary | ICD-10-CM

## 2020-04-05 DIAGNOSIS — M4310 Spondylolisthesis, site unspecified: Secondary | ICD-10-CM

## 2020-04-05 DIAGNOSIS — M47816 Spondylosis without myelopathy or radiculopathy, lumbar region: Secondary | ICD-10-CM

## 2020-04-10 MED FILL — PANTOPRAZOLE SOD DR 40 MG T: 40 | 30 days supply | Qty: 30 | Fill #0

## 2020-04-10 MED FILL — TESTOSTERONE CYP 200 MG/ML: 200 | 28 days supply | Qty: 2 | Fill #2

## 2020-04-20 ENCOUNTER — Other Ambulatory Visit: Payer: Self-pay | Admitting: Adult Health

## 2020-04-21 ENCOUNTER — Other Ambulatory Visit: Payer: Self-pay | Admitting: Adult Health

## 2020-04-21 MED FILL — ALPRAZolam 0.5 MG TABS: 0.5 | 30 days supply | Qty: 60 | Fill #0

## 2020-05-01 ENCOUNTER — Other Ambulatory Visit: Payer: Self-pay | Admitting: Adult Health

## 2020-05-01 DIAGNOSIS — F32A Depression, unspecified: Secondary | ICD-10-CM

## 2020-05-01 DIAGNOSIS — F419 Anxiety disorder, unspecified: Secondary | ICD-10-CM

## 2020-05-01 MED FILL — HYDROCODON-APAP 7.5-325: 7.5-325 | 30 days supply | Qty: 120 | Fill #0

## 2020-05-03 ENCOUNTER — Other Ambulatory Visit: Payer: Self-pay | Admitting: Adult Health

## 2020-05-03 MED FILL — SERTRALINE HCL 100 MG TAB: 100 | 90 days supply | Qty: 180 | Fill #0

## 2020-05-03 NOTE — Telephone Encounter (Signed)
SENT TO THE PHARMACY BY E-SCRIBE. 

## 2020-05-18 ENCOUNTER — Other Ambulatory Visit: Payer: Self-pay | Admitting: Adult Health

## 2020-05-18 DIAGNOSIS — I10 Essential (primary) hypertension: Secondary | ICD-10-CM

## 2020-05-18 DIAGNOSIS — F419 Anxiety disorder, unspecified: Secondary | ICD-10-CM

## 2020-05-18 DIAGNOSIS — F32A Depression, unspecified: Secondary | ICD-10-CM

## 2020-05-18 MED FILL — LEVOTHYROXINE 175 MCG TABLE: 175 | 90 days supply | Qty: 90 | Fill #2

## 2020-05-18 MED FILL — PANTOPRAZOLE SOD DR 40 MG T: 40 | 30 days supply | Qty: 30 | Fill #1

## 2020-05-18 MED FILL — PREGABALIN 225 MG CAPS: 225 | 30 days supply | Qty: 60 | Fill #2

## 2020-05-19 ENCOUNTER — Other Ambulatory Visit: Payer: Self-pay

## 2020-05-19 ENCOUNTER — Other Ambulatory Visit: Payer: Self-pay | Admitting: Adult Health

## 2020-05-19 ENCOUNTER — Ambulatory Visit: Payer: 59 | Admitting: Endocrinology

## 2020-05-19 VITALS — BP 142/70 | HR 80 | Ht 66.0 in | Wt 211.2 lb

## 2020-05-19 DIAGNOSIS — E890 Postprocedural hypothyroidism: Secondary | ICD-10-CM

## 2020-05-19 LAB — TSH: TSH: 2.43 u[IU]/mL (ref 0.35–4.50)

## 2020-05-19 LAB — T4, FREE: Free T4: 0.8 ng/dL (ref 0.60–1.60)

## 2020-05-19 MED FILL — LISINOPRIL 40 MG TABS: 40 | 90 days supply | Qty: 90 | Fill #0

## 2020-05-19 NOTE — Progress Notes (Signed)
Subjective:    Patient ID: Chad Wilson, male    DOB: 1975-09-22, 45 y.o.   MRN: [id number]  HPI Pt returns for f/u of post-RAI hypothyroidism (due to [place]; dx'ed 2017; he took tapazole at first, but then had RAI twice in 2018; he then started Synthroid).  He takes synthroid as rx'ed--says he never misses. Since synthroid was increased, he feels better in general.   Past Medical History:  Diagnosis Date  . Acute hepatitis C without mention of hepatic coma(070.51)    has been cleared   . Acute postoperative pain 12/10/2018  . Alcohol abuse, unspecified   . Anxiety and depression   . Anxiety states   . Essential hypertension, benign   . GERD (gastroesophageal reflux disease)   . Graves disease    treated by Dr Loanne Drilling  . Panic disorder without agoraphobia   . Thyroid disease    Graves Disease  . Tobacco use disorder     Past Surgical History:  Procedure Laterality Date  . ANTERIOR CERVICAL DECOMP/DISCECTOMY FUSION N/A 05/21/2017   Procedure: Anterior Cervical Decompression Fusion Cervical five-six, Cervical six-seven;  Surgeon: Eustace Moore, MD;  Location: [facility];  Service: Neurosurgery;  Laterality: N/A;  . MULTIPLE TOOTH EXTRACTIONS  15 yrs. ago  . WISDOM TOOTH EXTRACTION      Social History   Socioeconomic History  . Marital status: Married    Spouse name: Not on file  . Number of children: 0  . Years of education: Not on file  . Highest education level: Not on file  Occupational History    Employer: [employer]  Tobacco Use  . Smoking status: Current Every Day Smoker    Packs/day: 1.00    Years: 30.00    Pack years: 30.00    Types: Cigarettes  . Smokeless tobacco: Current User    Types: Chew  Vaping Use  . Vaping Use: Never used  Substance and Sexual Activity  . Alcohol use: Yes    Alcohol/week: 42.0 standard drinks    Types: 42 Cans of beer per week    Comment: 6 pack of beer daily  . Drug use: Not Currently    Comment: hx of cocaine  last  dose 7 yrs ago  . Sexual activity: Not on file  Other Topics Concern  . Not on file  Social History Narrative   He is working in [employer]    Diet: Fruits and veggies, water poweraid, occ sweet tea   Social Determinants of Radio broadcast assistant Strain: Not on file  Food Insecurity: Not on file  Transportation Needs: Not on file  Physical Activity: Not on file  Stress: Not on file  Social Connections: Not on file  Intimate Partner Violence: Not on file    Current Outpatient Medications on File Prior to Visit  Medication Sig Dispense Refill  . albuterol (VENTOLIN HFA) 108 (90 Base) MCG/ACT inhaler INHALE 2 PUFFS BY MOUTH EVERY 6 HOURS AS NEEDED. **DUE FOR YEARLY PHYSICAL** 8.5 g 0  . ALPRAZolam (XANAX) 0.5 MG tablet TAKE 1 TABLET (0.5 MG TOTAL) BY MOUTH 2 (TWO) TIMES DAILY. 60 tablet 2  . atorvastatin (LIPITOR) 40 MG tablet Take 1 tablet (40 mg total) by mouth at bedtime. 90 tablet 3  . busPIRone (BUSPAR) 5 MG tablet TAKE 1 TABLET BY MOUTH 2 TIMES DAILY. 180 tablet 1  . Colchicine 0.6 MG CAPS Take 1 tablet by mouth 2 (two) times daily as needed. 60 capsule 3  .  DEPO-TESTOSTERONE 200 MG/ML injection Take 200 mg by mouth every 14 (fourteen) days.    Marland Kitchen HYDROcodone-acetaminophen (NORCO) 7.5-325 MG tablet Take 1 tablet by mouth every 6 (six) hours as needed for severe pain. Must last 30 days. 120 tablet 0  . levothyroxine (SYNTHROID) 175 MCG tablet Take 1 tablet (175 mcg total) by mouth daily before breakfast. 90 tablet 2  . nabumetone (RELAFEN) 750 MG tablet Take 1 tablet (750 mg total) by mouth 2 (two) times daily as needed. 60 tablet 6  . nitroGLYCERIN (NITRO-DUR) 0.1 mg/hr patch Apply 1/4 patch to affected area 12 hours daily 30 patch 3  . pantoprazole (PROTONIX) 40 MG tablet TAKE 1 TABLET (40 MG TOTAL) BY MOUTH DAILY. 30 tablet 6  . pregabalin (LYRICA) 225 MG capsule Take 1 capsule (225 mg total) by mouth 2 (two) times daily. 60 capsule 2  . sildenafil (REVATIO) 20 MG tablet      . HYDROcodone-acetaminophen (NORCO) 7.5-325 MG tablet Take 1 tablet by mouth every 6 (six) hours as needed for severe pain. Must last 30 days. 120 tablet 0  . HYDROcodone-acetaminophen (NORCO) 7.5-325 MG tablet Take 1 tablet by mouth every 6 (six) hours as needed for severe pain. Must last 30 days. 120 tablet 0  . lisinopril (ZESTRIL) 40 MG tablet TAKE 1 TABLET BY MOUTH DAILY 90 tablet 1  . sertraline (ZOLOFT) 100 MG tablet TAKE 2 TABLETS BY MOUTH DAILY. 180 tablet 1  . [DISCONTINUED] omeprazole (PRILOSEC) 20 MG capsule Take 1 capsule (20 mg total) by mouth daily. 30 capsule 3   No current facility-administered medications on file prior to visit.    Allergies  Allergen Reactions  . Chantix [Varenicline] Other (See Comments)    Agitation and sleep walking     Family History  Problem Relation Age of Onset  . Anxiety disorder Father   . Heart disease Father   . Hypertension Father   . Thyroid disease Mother   . Anxiety disorder Sister   . Hyperthyroidism Sister   . Colon cancer Neg Hx   . Esophageal cancer Neg Hx   . Pancreatic cancer Neg Hx     BP (!) 142/70 (BP Location: Right Arm, Patient Position: Sitting, Cuff Size: Normal)   Pulse 80   Ht 5' 6"  (1.676 m)   Wt 211 lb 3.2 oz (95.8 kg)   SpO2 97%   BMI 34.09 kg/m    Review of Systems     Objective:   Physical Exam VITAL SIGNS:  See vs page GENERAL: no distress NECK: There is no palpable thyroid enlargement.  No thyroid nodule is palpable.  No palpable lymphadenopathy at the anterior neck.   Lab Results  Component Value Date   TSH 2.43 05/19/2020       Assessment & Plan:  Hypothyroidism: well-replaced.  Please continue the same synthroid

## 2020-05-19 NOTE — Patient Instructions (Signed)
Your blood pressure is high today.  Please see your primary care provider soon, to have it rechecked Blood tests are requested for you today.  We'll let you know about the results.  It is best to never miss the medication.  However, if you do miss it, next best is to double up the next time.  Please come back for a follow-up appointment in 6 months.

## 2020-05-25 MED FILL — ALPRAZolam 0.5 MG TABS: 0.5 | 30 days supply | Qty: 60 | Fill #1

## 2020-05-27 NOTE — Progress Notes (Deleted)
The patient's wife called indicating that she was under the impression that this was going via virtual visit.  No-show.

## 2020-05-29 ENCOUNTER — Other Ambulatory Visit: Payer: Self-pay | Admitting: Pain Medicine

## 2020-05-29 ENCOUNTER — Encounter: Payer: 59 | Admitting: Pain Medicine

## 2020-05-29 DIAGNOSIS — G894 Chronic pain syndrome: Secondary | ICD-10-CM

## 2020-05-29 NOTE — Progress Notes (Signed)
We are pending to begin downward taper of hydrocodone.

## 2020-05-29 NOTE — Patient Instructions (Incomplete)
____________________________________________________________________________________________  Drug Holidays (Slow)  What is a "Drug Holiday"? Drug Holiday: is the name given to the period of time during which a patient stops taking a medication(s) for the purpose of eliminating tolerance to the drug.  Benefits . Improved effectiveness of opioids. . Decreased opioid dose needed to achieve benefits. . Improved pain with lesser dose.  What is tolerance? Tolerance: is the progressive decreased in effectiveness of a drug due to its repetitive use. With repetitive use, the body gets use to the medication and as a consequence, it loses its effectiveness. This is a common problem seen with opioid pain medications. As a result, a larger dose of the drug is needed to achieve the same effect that used to be obtained with a smaller dose.  How long should a "Drug Holiday" last? You should stay off of the pain medicine for at least 14 consecutive days. (2 weeks)  Should I stop the medicine "cold [place]"? No. You should always coordinate with your Pain Specialist so that he/she can provide you with the correct medication dose to make the transition as smoothly as possible.  How do I stop the medicine? Slowly. You will be instructed to decrease the daily amount of pills that you take by one (1) pill every seven (7) days. This is called a "slow downward taper" of your dose. For example: if you normally take four (4) pills per day, you will be asked to drop this dose to three (3) pills per day for seven (7) days, then to two (2) pills per day for seven (7) days, then to one (1) per day for seven (7) days, and at the end of those last seven (7) days, this is when the "Drug Holiday" would start.   Will I have withdrawals? By doing a "slow downward taper" like this one, it is unlikely that you will experience any significant withdrawal symptoms. Typically, what triggers withdrawals is the sudden stop of a high  dose opioid therapy. Withdrawals can usually be avoided by slowly decreasing the dose over a prolonged period of time. If you do not follow these instructions and decide to stop your medication abruptly, withdrawals may be possible.  What are withdrawals? Withdrawals: refers to the wide range of symptoms that occur after stopping or dramatically reducing opiate drugs after heavy and prolonged use. Withdrawal symptoms do not occur to patients that use low dose opioids, or those who take the medication sporadically. Contrary to benzodiazepine (example: Valium, Xanax, etc.) or alcohol withdrawals ("Delirium Tremens"), opioid withdrawals are not lethal. Withdrawals are the physical manifestation of the body getting rid of the excess receptors.  Expected Symptoms Early symptoms of withdrawal may include: . Agitation . Anxiety . Muscle aches . Increased tearing . Insomnia . Runny nose . Sweating . Yawning  Late symptoms of withdrawal may include: . Abdominal cramping . Diarrhea . Dilated pupils . Goose bumps . Nausea . Vomiting  Will I experience withdrawals? Due to the slow nature of the taper, it is very unlikely that you will experience any.  What is a slow taper? Taper: refers to the gradual decrease in dose.  (Last update: 10/06/2019) ____________________________________________________________________________________________

## 2020-05-30 ENCOUNTER — Ambulatory Visit: Payer: 59 | Attending: Pain Medicine | Admitting: Pain Medicine

## 2020-05-30 ENCOUNTER — Other Ambulatory Visit: Payer: Self-pay

## 2020-05-30 ENCOUNTER — Other Ambulatory Visit: Payer: Self-pay | Admitting: Pain Medicine

## 2020-05-30 VITALS — BP 149/91 | HR 76 | Temp 97.2°F | Resp 16 | Ht 66.0 in | Wt 220.0 lb

## 2020-05-30 DIAGNOSIS — G8929 Other chronic pain: Secondary | ICD-10-CM | POA: Insufficient documentation

## 2020-05-30 DIAGNOSIS — M47816 Spondylosis without myelopathy or radiculopathy, lumbar region: Secondary | ICD-10-CM | POA: Insufficient documentation

## 2020-05-30 DIAGNOSIS — M545 Low back pain, unspecified: Secondary | ICD-10-CM | POA: Diagnosis not present

## 2020-05-30 DIAGNOSIS — M25551 Pain in right hip: Secondary | ICD-10-CM | POA: Insufficient documentation

## 2020-05-30 DIAGNOSIS — M4310 Spondylolisthesis, site unspecified: Secondary | ICD-10-CM | POA: Insufficient documentation

## 2020-05-30 DIAGNOSIS — Z79899 Other long term (current) drug therapy: Secondary | ICD-10-CM | POA: Diagnosis not present

## 2020-05-30 DIAGNOSIS — G894 Chronic pain syndrome: Secondary | ICD-10-CM | POA: Insufficient documentation

## 2020-05-30 MED ORDER — HYDROCODONE-ACETAMINOPHEN 5-325 MG PO TABS: 1.0000 | ORAL_TABLET | Freq: Two times a day (BID) | ORAL | 0 refills | Status: DC

## 2020-05-30 MED ORDER — HYDROCODONE-ACETAMINOPHEN 5-325 MG PO TABS: 1.0000 | ORAL_TABLET | Freq: Every day | ORAL | 0 refills | Status: DC

## 2020-05-30 MED ORDER — HYDROCODONE-ACETAMINOPHEN 5-325 MG PO TABS: 1.0000 | ORAL_TABLET | Freq: Three times a day (TID) | ORAL | 0 refills | Status: DC

## 2020-05-30 MED ORDER — HYDROCODONE-ACETAMINOPHEN 5-325 MG PO TABS: 1.0000 | ORAL_TABLET | Freq: Four times a day (QID) | ORAL | 0 refills | Status: DC

## 2020-05-30 NOTE — Patient Instructions (Addendum)
____________________________________________________________________________________________  Drug Holidays (Slow)  What is a "Drug Holiday"? Drug Holiday: is the name given to the period of time during which a patient stops taking a medication(s) for the purpose of eliminating tolerance to the drug.  Benefits . Improved effectiveness of opioids. . Decreased opioid dose needed to achieve benefits. . Improved pain with lesser dose.  What is tolerance? Tolerance: is the progressive decreased in effectiveness of a drug due to its repetitive use. With repetitive use, the body gets use to the medication and as a consequence, it loses its effectiveness. This is a common problem seen with opioid pain medications. As a result, a larger dose of the drug is needed to achieve the same effect that used to be obtained with a smaller dose.  How long should a "Drug Holiday" last? You should stay off of the pain medicine for at least 14 consecutive days. (2 weeks)  Should I stop the medicine "cold [place]"? No. You should always coordinate with your Pain Specialist so that he/she can provide you with the correct medication dose to make the transition as smoothly as possible.  How do I stop the medicine? Slowly. You will be instructed to decrease the daily amount of pills that you take by one (1) pill every seven (7) days. This is called a "slow downward taper" of your dose. For example: if you normally take four (4) pills per day, you will be asked to drop this dose to three (3) pills per day for seven (7) days, then to two (2) pills per day for seven (7) days, then to one (1) per day for seven (7) days, and at the end of those last seven (7) days, this is when the "Drug Holiday" would start.   Will I have withdrawals? By doing a "slow downward taper" like this one, it is unlikely that you will experience any significant withdrawal symptoms. Typically, what triggers withdrawals is the sudden stop of a high  dose opioid therapy. Withdrawals can usually be avoided by slowly decreasing the dose over a prolonged period of time. If you do not follow these instructions and decide to stop your medication abruptly, withdrawals may be possible.  What are withdrawals? Withdrawals: refers to the wide range of symptoms that occur after stopping or dramatically reducing opiate drugs after heavy and prolonged use. Withdrawal symptoms do not occur to patients that use low dose opioids, or those who take the medication sporadically. Contrary to benzodiazepine (example: Valium, Xanax, etc.) or alcohol withdrawals ("Delirium Tremens"), opioid withdrawals are not lethal. Withdrawals are the physical manifestation of the body getting rid of the excess receptors.  Expected Symptoms Early symptoms of withdrawal may include: . Agitation . Anxiety . Muscle aches . Increased tearing . Insomnia . Runny nose . Sweating . Yawning  Late symptoms of withdrawal may include: . Abdominal cramping . Diarrhea . Dilated pupils . Goose bumps . Nausea . Vomiting  Will I experience withdrawals? Due to the slow nature of the taper, it is very unlikely that you will experience any.  What is a slow taper? Taper: refers to the gradual decrease in dose.  (Last update: 10/06/2019) ____________________________________________________________________________________________    ____________________________________________________________________________________________  Pain Prevention Technique  Definition:   A technique used to minimize the effects of an activity known to cause inflammation or swelling, which in turn leads to an increase in pain.  Purpose: To prevent swelling from occurring. It is based on the fact that it is easier to prevent swelling from  happening than it is to get rid of it, once it occurs.  Contraindications: 1. Anyone with allergy or hypersensitivity to the recommended medications. 2. Anyone  taking anticoagulants (Blood Thinners) (e.g., Coumadin, Warfarin, Plavix, etc.). 3. Patients in Renal Failure.  Technique: Before you undertake an activity known to cause pain, or a flare-up of your chronic pain, and before you experience any pain, do the following:  1. On a full stomach, take 4 (four) over the counter Ibuprofens 269m tablets (Motrin), for a total of 800 mg. 2. In addition, take over the counter Magnesium 400 to 500 mg, before doing the activity.  3. Six (6) hours later, again on a full stomach, repeat the Ibuprofen. 4. That night, take a warm shower and stretch under the running warm water.  This technique may be sufficient to abort the pain and discomfort before it happens. Keep in mind that it takes a lot less medication to prevent swelling than it takes to eliminate it once it occurs.  ____________________________________________________________________________________________   ____________________________________________________________________________________________  Preparing for Procedure with Sedation  Procedure appointments are limited to planned procedures: . No Prescription Refills. . No disability issues will be discussed. . No medication changes will be discussed.  Instructions: . Oral Intake: Do not eat or drink anything for at least 8 hours prior to your procedure. (Exception: Blood Pressure Medication. See below.) . Transportation: Unless otherwise stated by your physician, you may drive yourself after the procedure. . Blood Pressure Medicine: Do not forget to take your blood pressure medicine with a sip of water the morning of the procedure. If your Diastolic (lower reading)is above 100 mmHg, elective cases will be cancelled/rescheduled. . Blood thinners: These will need to be stopped for procedures. Notify our staff if you are taking any blood thinners. Depending on which one you take, there will be specific instructions on how and when to stop  it. . Diabetics on insulin: Notify the staff so that you can be scheduled 1st case in the morning. If your diabetes requires high dose insulin, take only  of your normal insulin dose the morning of the procedure and notify the staff that you have done so. . Preventing infections: Shower with an antibacterial soap the morning of your procedure. . Build-up your immune system: Take 1000 mg of Vitamin C with every meal (3 times a day) the day prior to your procedure. .Marland KitchenAntibiotics: Inform the staff if you have a condition or reason that requires you to take antibiotics before dental procedures. . Pregnancy: If you are pregnant, call and cancel the procedure. . Sickness: If you have a cold, fever, or any active infections, call and cancel the procedure. . Arrival: You must be in the facility at least 30 minutes prior to your scheduled procedure. . Children: Do not bring children with you. . Dress appropriately: Bring dark clothing that you would not mind if they get stained. . Valuables: Do not bring any jewelry or valuables.  Reasons to call and reschedule or cancel your procedure: (Following these recommendations will minimize the risk of a serious complication.) . Surgeries: Avoid having procedures within 2 weeks of any surgery. (Avoid for 2 weeks before or after any surgery). . Flu Shots: Avoid having procedures within 2 weeks of a flu shots or . (Avoid for 2 weeks before or after immunizations). . Barium: Avoid having a procedure within 7-10 days after having had a radiological study involving the use of radiological contrast. (Myelograms, Barium swallow or enema study). . Heart  attacks: Avoid any elective procedures or surgeries for the initial 6 months after a "Myocardial Infarction" (Heart Attack). . Blood thinners: It is imperative that you stop these medications before procedures. Let us know if you if you take any blood thinner.  . Infection: Avoid procedures during or within two weeks of an  infection (including chest colds or gastrointestinal problems). Symptoms associated with infections include: Localized redness, fever, chills, night sweats or profuse sweating, burning sensation when voiding, cough, congestion, stuffiness, runny nose, sore throat, diarrhea, nausea, vomiting, cold or Flu symptoms, recent or current infections. It is specially important if the infection is over the area that we intend to treat. Marland Kitchen Heart and lung problems: Symptoms that may suggest an active cardiopulmonary problem include: cough, chest pain, breathing difficulties or shortness of breath, dizziness, ankle swelling, uncontrolled high or unusually low blood pressure, and/or palpitations. If you are experiencing any of these symptoms, cancel your procedure and contact your primary care physician for an evaluation.  Remember:  Regular Business hours are:  Monday to Thursday 8:00 AM to 4:00 PM  Provider's Schedule: Milinda Pointer, MD:  Procedure days: Tuesday and Thursday 7:30 AM to 4:00 PM  Gillis Santa, MD:  Procedure days: Monday and Wednesday 7:30 AM to 4:00 PM ____________________________________________________________________________________________   ____________________________________________________________________________________________  General Risks and Possible Complications  Patient Responsibilities: It is important that you read this as it is part of your informed consent. It is our duty to inform you of the risks and possible complications associated with treatments offered to you. It is your responsibility as a patient to read this and to ask questions about anything that is not clear or that you believe was not covered in this document.  Patient's Rights: You have the right to refuse treatment. You also have the right to change your mind, even after initially having agreed to have the treatment done. However, under this last option, if you wait until the last second to change  your mind, you may be charged for the materials used up to that point.  Introduction: Medicine is not an Chief Strategy Officer. Everything in Medicine, including the lack of treatment(s), carries the potential for danger, harm, or loss (which is by definition: Risk). In Medicine, a complication is a secondary problem, condition, or disease that can aggravate an already existing one. All treatments carry the risk of possible complications. The fact that a side effects or complications occurs, does not imply that the treatment was conducted incorrectly. It must be clearly understood that these can happen even when everything is done following the highest safety standards.  No treatment: You can choose not to proceed with the proposed treatment alternative. The "PRO(s)" would include: avoiding the risk of complications associated with the therapy. The "CON(s)" would include: not getting any of the treatment benefits. These benefits fall under one of three categories: diagnostic; therapeutic; and/or palliative. Diagnostic benefits include: getting information which can ultimately lead to improvement of the disease or symptom(s). Therapeutic benefits are those associated with the successful treatment of the disease. Finally, palliative benefits are those related to the decrease of the primary symptoms, without necessarily curing the condition (example: decreasing the pain from a flare-up of a chronic condition, such as incurable terminal cancer).  General Risks and Complications: These are associated to most interventional treatments. They can occur alone, or in combination. They fall under one of the following six (6) categories: no benefit or worsening of symptoms; bleeding; infection; nerve damage; allergic reactions; and/or death. 1.  No benefits or worsening of symptoms: In Medicine there are no guarantees, only probabilities. No healthcare provider can ever guarantee that a medical treatment will work, they can only  state the probability that it may. Furthermore, there is always the possibility that the condition may worsen, either directly, or indirectly, as a consequence of the treatment. 2. Bleeding: This is more common if the patient is taking a blood thinner, either prescription or over the counter (example: Goody Powders, Fish oil, Aspirin, Garlic, etc.), or if suffering a condition associated with impaired coagulation (example: Hemophilia, cirrhosis of the liver, low platelet counts, etc.). However, even if you do not have one on these, it can still happen. If you have any of these conditions, or take one of these drugs, make sure to notify your treating physician. 3. Infection: This is more common in patients with a compromised immune system, either due to disease (example: diabetes, cancer, human immunodeficiency virus [HIV], etc.), or due to medications or treatments (example: therapies used to treat cancer and rheumatological diseases). However, even if you do not have one on these, it can still happen. If you have any of these conditions, or take one of these drugs, make sure to notify your treating physician. 4. Nerve Damage: This is more common when the treatment is an invasive one, but it can also happen with the use of medications, such as those used in the treatment of cancer. The damage can occur to small secondary nerves, or to large primary ones, such as those in the spinal cord and brain. This damage may be temporary or permanent and it may lead to impairments that can range from temporary numbness to permanent paralysis and/or brain death. 5. Allergic Reactions: Any time a substance or material comes in contact with our body, there is the possibility of an allergic reaction. These can range from a mild skin rash (contact dermatitis) to a severe systemic reaction (anaphylactic reaction), which can result in death. 6. Death: In general, any medical intervention can result in death, most of the time due  to an unforeseen complication. ____________________________________________________________________________________________

## 2020-05-30 NOTE — Progress Notes (Signed)
Nursing Pain Medication Assessment:  Safety precautions to be maintained throughout the outpatient stay will include: orient to surroundings, keep bed in low position, maintain call bell within reach at all times, provide assistance with transfer out of bed and ambulation.  Medication Inspection Compliance: Pill count conducted under aseptic conditions, in front of the patient. Neither the pills nor the bottle was removed from the patient's sight at any time. Once count was completed pills were immediately returned to the patient in their original bottle.  Medication: Hydrocodone/APAP Pill/Patch Count: 0 of 120 pills remain Pill/Patch Appearance: Markings consistent with prescribed medication Bottle Appearance: Standard pharmacy container. Clearly labeled. Filled Date:02/14/2022Last Medication intake:  Today

## 2020-05-30 NOTE — Progress Notes (Signed)
PROVIDER NOTE: Information contained herein reflects review and annotations entered in association with encounter. Interpretation of such information and data should be left to medically-trained personnel. Information provided to patient can be located elsewhere in the medical record under "Patient Instructions". Document created using STT-dictation technology, any transcriptional errors that may result from process are unintentional.    Patient: Chad Wilson  Service Category: E/M  Provider: [name], MD  DOB: 01/08/1976  DOS: 05/30/2020  Specialty: Interventional Pain Management  MRN: [id number]  Setting: Ambulatory outpatient  PCP: Dorothyann Peng, NP  Type: Established Patient    Referring Provider: Dorothyann Peng, NP  Location: Office  Delivery: Face-to-face     HPI  [name], a 45 y.o. year old male, is here today because of his Chronic pain syndrome [G89.4]. Chad Wilson primary complain today is Back Pain (lower) Last encounter: My last encounter with him was on 05/29/2020. Pertinent problems: Chad Wilson has Chronic neck pain with history of cervical spinal surgery; Cervical radiculopathy at C6; Status post lumbar spine surgery for decompression of spinal cord; History of cervical spinal surgery; DDD (degenerative disc disease), lumbosacral; Lumbar facet syndrome (Bilateral); Chronic pain syndrome; Chronic low back pain (Primary area of Pain) (Bilateral) (R=L) w/o sciatica; Spondylosis without myelopathy or radiculopathy, lumbosacral region; Grade 1 Lumbosacral Retrolisthesis of L5/S1; Lumbar facet hypertrophy (Bilateral); Osteoarthritis of facet joint of lumbar spine; Osteoarthritis of lumbar spine; Lumbar spondylosis; DDD (degenerative disc disease), cervical; Cervicalgia; Abnormal MRI, lumbar spine; Neurogenic pain; and Chronic hip pain (Right) on their pertinent problem list. Pain Assessment: Severity of Chronic pain is reported as a 6 /10. Location: Back Lower/right foot. Onset: More  than a month ago. Quality: Discomfort. Timing: Constant. Modifying factor(s): sitting, medications. Vitals:  height is 5' 6"  (1.676 m) and weight is 220 lb (99.8 kg). His temporal temperature is 97.2 F (36.2 C) (abnormal). His blood pressure is 149/91 (abnormal) and his pulse is 76. His respiration is 16 and oxygen saturation is 96%.   Reason for encounter: medication management.  Right-sided lumbar facet radiofrequency ablation completed on 01/18/2020.  Left side completed on 02/24/2020.  Today we had a very extensive conversation about his opioids and how we will be tapering the dose down until we can completely stop them.  He also indicated still having some pain in the right lower back hip area.  Physical exam today was positive for right hip arthralgia upon performing the figure "4"/Patrick maneuver.  In view of this, we will be bringing the patient in for a diagnostic right intra-articular hip joint injection under fluoroscopic guidance.  RTCB: 07/18/2020 Nonopioids transferred 01/18/2020: Lyrica  Pharmacotherapy Assessment   Analgesic: Oxycodone IR 5 mg, 1 tab PO q 6 hrs (20 mg/day of oxycodone) MME/day: 30 mg/day.   Monitoring: [place] PMP: PDMP reviewed during this encounter.       Pharmacotherapy: No side-effects or adverse reactions reported. Compliance: No problems identified. Effectiveness: Clinically acceptable.  Landis Martins, RN  05/30/2020  2:40 PM  Sign when Signing Visit Nursing Pain Medication Assessment:  Safety precautions to be maintained throughout the outpatient stay will include: orient to surroundings, keep bed in low position, maintain call bell within reach at all times, provide assistance with transfer out of bed and ambulation.  Medication Inspection Compliance: Pill count conducted under aseptic conditions, in front of the patient. Neither the pills nor the bottle was removed from the patient's sight at any time. Once count was completed pills were immediately  returned  to the patient in their original bottle.  Medication: Hydrocodone/APAP Pill/Patch Count: 0 of 120 pills remain Pill/Patch Appearance: Markings consistent with prescribed medication Bottle Appearance: Standard pharmacy container. Clearly labeled. Filled Date:02/14/2022Last Medication intake:  Today    UDS:  Summary  Date Value Ref Range Status  08/23/2019 Note  Final    Comment:    ==================================================================== [employer] (MW) ==================================================================== Test                             Result       Flag       Units  Drug Present and Declared for Prescription Verification   7-aminoclonazepam              36           EXPECTED   ng/mg creat    7-aminoclonazepam is an expected metabolite of clonazepam. Source of    clonazepam is a scheduled prescription medication.    Oxycodone                      427          EXPECTED   ng/mg creat   Oxymorphone                    665          EXPECTED   ng/mg creat   Noroxycodone                   1768         EXPECTED   ng/mg creat   Noroxymorphone                 429          EXPECTED   ng/mg creat    Sources of oxycodone are scheduled prescription medications.    Oxymorphone, noroxycodone, and noroxymorphone are expected    metabolites of oxycodone. Oxymorphone is also available as a    scheduled prescription medication.  Drug Present not Declared for Prescription Verification   Alprazolam                     29           UNEXPECTED ng/mg creat   Alpha-hydroxyalprazolam        49           UNEXPECTED ng/mg creat    Source of alprazolam is a scheduled prescription medication. Alpha-    hydroxyalprazolam is an expected metabolite of alprazolam.  ==================================================================== Test                      Result    Flag   Units      Ref Range   Creatinine              146              mg/dL       >=20 ==================================================================== Declared Medications:  The flagging and interpretation on this report are based on the  following declared medications.  Unexpected results may arise from  inaccuracies in the declared medications.   **Note: The testing scope of this panel includes these medications:   Clonazepam (Klonopin)  Oxycodone (Roxicodone)   **Note: The testing scope of this panel does not include the  following reported medications:   Albuterol (Ventolin HFA)  Atorvastatin (Lipitor)  Buspirone (  Buspar)  Colchicine  Levothyroxine (Synthroid)  Lisinopril (Zestril)  Omeprazole (Prilosec)  Pantoprazole (Protonix)  Pregabalin (Lyrica)  Sertraline (Zoloft)  Sildenafil  Testosterone ==================================================================== For clinical consultation, please call [phone]. ====================================================================      ROS  Constitutional: Denies any fever or chills Gastrointestinal: No reported hemesis, hematochezia, vomiting, or acute GI distress Musculoskeletal: Denies any acute onset joint swelling, redness, loss of ROM, or weakness Neurological: No reported episodes of acute onset apraxia, aphasia, dysarthria, agnosia, amnesia, paralysis, loss of coordination, or loss of consciousness  Medication Review  ALPRAZolam, Colchicine, HYDROcodone-acetaminophen, albuterol, atorvastatin, busPIRone, levothyroxine, lisinopril, nabumetone, nitroGLYCERIN, omeprazole, pantoprazole, pregabalin, sertraline, sildenafil, and testosterone cypionate  History Review  Allergy: Chad Wilson is allergic to chantix [varenicline]. Drug: Chad Wilson  reports previous drug use. Alcohol:  reports current alcohol use of about 42.0 standard drinks of alcohol per week. Tobacco:  reports that he has been smoking cigarettes. He has a 30.00 pack-year smoking history. His smokeless tobacco use includes  chew. Social: Chad Wilson  reports that he has been smoking cigarettes. He has a 30.00 pack-year smoking history. His smokeless tobacco use includes chew. He reports current alcohol use of about 42.0 standard drinks of alcohol per week. He reports previous drug use. Medical:  has a past medical history of Acute hepatitis C without mention of hepatic coma(070.51), Acute postoperative pain (12/10/2018), Alcohol abuse, unspecified, Anxiety and depression, Anxiety states, Essential hypertension, benign, GERD (gastroesophageal reflux disease), Graves disease, Panic disorder without agoraphobia, Thyroid disease, and Tobacco use disorder. Surgical: Chad Wilson  has a past surgical history that includes Multiple tooth extractions (15 yrs. ago); Anterior cervical decomp/discectomy fusion (N/A, 05/21/2017); and Wisdom tooth extraction. Family: family history includes Anxiety disorder in his father and sister; Heart disease in his father; Hypertension in his father; Hyperthyroidism in his sister; Thyroid disease in his mother.  Laboratory Chemistry Profile   Renal Lab Results  Component Value Date   BUN 13 09/02/2019   CREATININE 0.92 09/02/2019   BCR 8 (L) 06/03/2018   GFR 88.79 09/02/2019   GFRAA >60 10/05/2018   GFRNONAA >60 10/05/2018     Hepatic Lab Results  Component Value Date   AST 20 09/02/2019   ALT 43 09/02/2019   ALBUMIN 4.5 09/02/2019   ALKPHOS 83 09/02/2019   HCVAB (A) 09/29/2009    Reactive (NOTE) Result repeated and verified.                                                                       This test is for screening purposes only.  Reactive results should be confirmed by an alternative method.  Suggest HCV Qualitative, PCR, test code  83130.  Specimens will be stable for reflex testing up to 3 days after collection.   LIPASE 26 10/05/2018     Electrolytes Lab Results  Component Value Date   NA 139 09/02/2019   K 3.6 09/02/2019   CL 102 09/02/2019   CALCIUM 9.3 09/02/2019   MG  2.1 06/03/2018     Bone Lab Results  Component Value Date   VD25OH 21.01 (L) 02/17/2018   TESTOFREE See below 02/17/2018   TESTOSTERONE 147 (L) 02/17/2018     Inflammation (CRP: Acute Phase) (ESR: Chronic Phase) Lab Results  Component Value Date   CRP 1 06/03/2018   ESRSEDRATE 21 (H) 06/03/2018       Note: Above Lab results reviewed.  Recent Imaging Review  DG PAIN CLINIC C-ARM 1-60 MIN NO REPORT Fluoro was used, but no Radiologist interpretation will be provided.  Please refer to "NOTES" tab for provider progress note. Note: Reviewed        Physical Exam  General appearance: Well nourished, well developed, and well hydrated. In no apparent acute distress Mental status: Alert, oriented x 3 (person, place, & time)       Respiratory: No evidence of acute respiratory distress Eyes: PERLA Vitals: BP (!) 149/91   Pulse 76   Temp (!) 97.2 F (36.2 C) (Temporal)   Resp 16   Ht 5' 6"  (1.676 m)   Wt 220 lb (99.8 kg)   SpO2 96%   BMI 35.51 kg/m  BMI: Estimated body mass index is 35.51 kg/m as calculated from the following:   Height as of this encounter: 5' 6"  (1.676 m).   Weight as of this encounter: 220 lb (99.8 kg). Ideal: Ideal body weight: 63.8 kg (140 lb 10.5 oz) Adjusted ideal body weight: 78.2 kg (172 lb 6.3 oz)  Assessment   Status Diagnosis  Controlled Controlled Controlled 1. Chronic pain syndrome   2. Lumbar facet syndrome (Bilateral)   3. Chronic low back pain (Primary area of Pain) (Bilateral) (R=L) w/o sciatica   4. Grade 1 Lumbosacral Retrolisthesis of L5/S1   5. Pharmacologic therapy   6. Chronic hip pain (Right)      Updated Problems: No problems updated.  Plan of Care  Problem-specific:  No problem-specific Assessment & Plan notes found for this encounter.  Mr. Chad E Wilson has a current medication list which includes the following long-term medication(s): albuterol, atorvastatin, colchicine, depo-testosterone, hydrocodone-acetaminophen,  hydrocodone-acetaminophen, [START ON 06/13/2020] hydrocodone-acetaminophen, [START ON 06/20/2020] hydrocodone-acetaminophen, [START ON 06/27/2020] hydrocodone-acetaminophen, levothyroxine, lisinopril, nitroglycerin, pantoprazole, sertraline, pregabalin, and [DISCONTINUED] omeprazole.  Pharmacotherapy (Medications Ordered): Meds ordered this encounter  Medications  . HYDROcodone-acetaminophen (NORCO/VICODIN) 5-325 MG tablet    Sig: Take 1 tablet by mouth 5 (five) times daily for 7 days. Must last 7 days.    Dispense:  35 tablet    Refill:  0    Prescription is part of a downward taper. Fill prescriptions in the correct order. Failure to do so may trigger withdrawal syndrome. Fill date: 05/30/2020. To last until: 06/06/2020.  Marland Kitchen HYDROcodone-acetaminophen (NORCO/VICODIN) 5-325 MG tablet    Sig: Take 1 tablet by mouth 4 (four) times daily for 7 days. Must last 7 days.    Dispense:  28 tablet    Refill:  0    Prescription is part of a downward taper. Fill prescriptions in the correct order. Failure to do so may trigger withdrawal syndrome. Fill date: 06/06/2020. To last until: 06/13/2020.  Marland Kitchen HYDROcodone-acetaminophen (NORCO/VICODIN) 5-325 MG tablet    Sig: Take 1 tablet by mouth 3 (three) times daily for 7 days. Must last 7 days.    Dispense:  21 tablet    Refill:  0    Prescription is part of a downward taper. Fill prescriptions in the correct order. Failure to do so may trigger withdrawal syndrome. Fill date: 06/13/2020. To last until: 06/20/2020.  Marland Kitchen HYDROcodone-acetaminophen (NORCO/VICODIN) 5-325 MG tablet    Sig: Take 1 tablet by mouth 2 (two) times daily for 7 days. Must last 7 days.    Dispense:  14 tablet    Refill:  0  Prescription is part of a downward taper. Fill prescriptions in the correct order. Failure to do so may trigger withdrawal syndrome. Fill date: 06/20/2020. To last until: 06/27/2020.  Marland Kitchen HYDROcodone-acetaminophen (NORCO/VICODIN) 5-325 MG tablet    Sig: Take 1 tablet by mouth daily for 7  days. Must last 7 days.    Dispense:  7 tablet    Refill:  0    Prescription is part of a downward taper. Fill prescriptions in the correct order. Failure to do so may trigger withdrawal syndrome. Fill date: 06/27/2020. To last until: 07/04/2020.   Orders:  Orders Placed This Encounter  Procedures  . HIP INJECTION    Standing Status:   Future    Standing Expiration Date:   08/30/2020    Scheduling Instructions:     Side: Right-sided     Sedation: With Sedation.     Timeframe: As soon as schedule allows  . DG HIP UNILAT W OR W/O PELVIS 2-3 VIEWS RIGHT    Please describe any evidence of DJD, such as joint narrowing, asymmetry, cysts, or any anomalies in bone density, production, or erosion.    Standing Status:   Future    Standing Expiration Date:   06/30/2020    Scheduling Instructions:     Imaging must be done as soon as possible. Inform patient that order will expire within 30 days and I will not renew it.    Order Specific Question:   Reason for Exam (SYMPTOM  OR DIAGNOSIS REQUIRED)    Answer:   Right hip pain/arthralgia    Order Specific Question:   Preferred imaging location?    Answer:   [facility]    Order Specific Question:   Call Results- Best Contact Number?    Answer:   [phone] ([facility])    Order Specific Question:   Release to patient    Answer:   Immediate   Follow-up plan:   Return for Procedure (w/ sedation): (R) IA Hip inj. #1.      Considering:   Therapeutic bilateral lumbar facet RFA #2  (we will start on the right side)   PRN Procedures:   Palliative/therapeutic bilateral lumbar facet block #4  Palliative right lumbar facet RFA #2 (last done 12/10/2018) (100/100/100/100) Palliative left lumbar facet RFA #2 (last done 01/05/2019) (100/100/50/90-100)      Recent Visits Date Type Provider Dept  05/30/20 Office Visit Milinda Pointer, MD [facility]  04/05/20 Telemedicine Milinda Pointer, MD [facility]  Showing  recent visits within past 90 days and meeting all other requirements Future Appointments Date Type Provider Dept  06/08/20 Appointment Milinda Pointer, MD [facility]  Showing future appointments within next 90 days and meeting all other requirements  I discussed the assessment and treatment plan with the patient. The patient was provided an opportunity to ask questions and all were answered. The patient agreed with the plan and demonstrated an understanding of the instructions.  Patient advised to call back or seek an in-person evaluation if the symptoms or condition worsens.  Duration of encounter: 46 minutes.  Note by: [name], MD Date: 05/30/2020; Time: 10:16 AM

## 2020-06-05 ENCOUNTER — Encounter: Payer: Self-pay | Admitting: Pain Medicine

## 2020-06-06 MED FILL — HYDROCODON-APAP 5-325: 5-325 | 7 days supply | Qty: 28 | Fill #0

## 2020-06-07 ENCOUNTER — Encounter: Payer: Self-pay | Admitting: Pain Medicine

## 2020-06-07 DIAGNOSIS — M7071 Other bursitis of hip, right hip: Secondary | ICD-10-CM | POA: Insufficient documentation

## 2020-06-07 DIAGNOSIS — M76891 Other specified enthesopathies of right lower limb, excluding foot: Secondary | ICD-10-CM | POA: Insufficient documentation

## 2020-06-07 NOTE — Patient Instructions (Incomplete)

## 2020-06-07 NOTE — Progress Notes (Deleted)
PROVIDER NOTE: Information contained herein reflects review and annotations entered in association with encounter. Interpretation of such information and data should be left to medically-trained personnel. Information provided to patient can be located elsewhere in the medical record under "Patient Instructions". Document created using STT-dictation technology, any transcriptional errors that may result from process are unintentional.    Patient: Chad Wilson  Service Category: Procedure  Provider: [name], MD  DOB: Jun 18, 1975  DOS: 06/08/2020  Location: [facility]  MRN: [id number]  Setting: Ambulatory - outpatient  Referring Provider: Dorothyann Peng, NP  Type: Established Patient  Specialty: Interventional Pain Management  PCP: Dorothyann Peng, NP   Primary Reason for Visit: Interventional Pain Management Treatment. CC: No chief complaint on file.  Procedure:          Anesthesia, Analgesia, Anxiolysis:  Type: Intra-Articular Hip Injection #1  Primary Purpose: Diagnostic Region: Posterolateral hip joint area. Level: Lower pelvic and hip joint level. Target Area: Superior aspect of the hip joint cavity, going thru the superior portion of the capsular ligament. Approach: Posterolateral approach. Laterality: Right  Type: Moderate (Conscious) Sedation combined with Local Anesthesia Indication(s): Analgesia and Anxiety Route: Intravenous (IV) IV Access: Secured Sedation: Meaningful verbal contact was maintained at all times during the procedure  Local Anesthetic: Lidocaine 1-2%  Position: Lateral Decubitus with bad side up Prepped Area: Entire Posterolateral hip area. DuraPrep (Iodine Povacrylex [0.7% available iodine] and Isopropyl Alcohol, 74% w/w)   Indications: 1. Chronic hip pain (Right)   2. Enthesopathy of hip region (Right)   3. Other bursitis of hip (Right)    Pain Score: Pre-procedure:  /10 Post-procedure:  /10   On 05/30/2020 I ordered a diagnostic  x-ray of his right hip and as of 06/07/2020 this x-ray had not been done.  Pre-op H&P Assessment:  Chad Wilson is a 45 y.o. (year old), male patient, seen today for interventional treatment. He  has a past surgical history that includes Multiple tooth extractions (15 yrs. ago); Anterior cervical decomp/discectomy fusion (N/A, 05/21/2017); and Wisdom tooth extraction. Chad Wilson has a current medication list which includes the following prescription(s): albuterol, alprazolam, atorvastatin, buspirone, colchicine, depo-testosterone, hydrocodone-acetaminophen, hydrocodone-acetaminophen, [START ON 06/13/2020] hydrocodone-acetaminophen, [START ON 06/20/2020] hydrocodone-acetaminophen, [START ON 06/27/2020] hydrocodone-acetaminophen, levothyroxine, lisinopril, nabumetone, nitroglycerin, pantoprazole, pregabalin, sertraline, sildenafil, and [DISCONTINUED] omeprazole. His primarily concern today is the No chief complaint on file.  Initial Vital Signs:  Pulse/HCG Rate:    Temp:   Resp:   BP:   SpO2:    BMI: Estimated body mass index is 35.51 kg/m as calculated from the following:   Height as of 05/30/20: 5' 6"  (1.676 m).   Weight as of 05/30/20: 220 lb (99.8 kg).  Risk Assessment: Allergies: Reviewed. He is allergic to chantix [varenicline].  Allergy Precautions: None required Coagulopathies: Reviewed. None identified.  Blood-thinner therapy: None at this time Active Infection(s): Reviewed. None identified. Chad Wilson is afebrile  Site Confirmation: Chad Wilson was asked to confirm the procedure and laterality before marking the site Procedure checklist: Completed Consent: Before the procedure and under the influence of no sedative(s), amnesic(s), or anxiolytics, the patient was informed of the treatment options, risks and possible complications. To fulfill our ethical and legal obligations, as recommended by the American Medical Association's Code of Ethics, I have informed the patient of my clinical impression; the  nature and purpose of the treatment or procedure; the risks, benefits, and possible complications of the intervention; the alternatives, including doing nothing; the risk(s) and benefit(s) of  the alternative treatment(s) or procedure(s); and the risk(s) and benefit(s) of doing nothing. The patient was provided information about the general risks and possible complications associated with the procedure. These may include, but are not limited to: failure to achieve desired goals, infection, bleeding, organ or nerve damage, allergic reactions, paralysis, and death. In addition, the patient was informed of those risks and complications associated to the procedure, such as failure to decrease pain; infection; bleeding; organ or nerve damage with subsequent damage to sensory, motor, and/or autonomic systems, resulting in permanent pain, numbness, and/or weakness of one or several areas of the body; allergic reactions; (i.e.: anaphylactic reaction); and/or death. Furthermore, the patient was informed of those risks and complications associated with the medications. These include, but are not limited to: allergic reactions (i.e.: anaphylactic or anaphylactoid reaction(s)); adrenal axis suppression; blood sugar elevation that in diabetics may result in ketoacidosis or comma; water retention that in patients with history of congestive heart failure may result in shortness of breath, pulmonary edema, and decompensation with resultant heart failure; weight gain; swelling or edema; medication-induced neural toxicity; particulate matter embolism and blood vessel occlusion with resultant organ, and/or nervous system infarction; and/or aseptic necrosis of one or more joints. Finally, the patient was informed that Medicine is not an exact science; therefore, there is also the possibility of unforeseen or unpredictable risks and/or possible complications that may result in a catastrophic outcome. The patient indicated having  understood very clearly. We have given the patient no guarantees and we have made no promises. Enough time was given to the patient to ask questions, all of which were answered to the patient's satisfaction. Chad Wilson has indicated that he wanted to continue with the procedure. Attestation: I, the ordering provider, attest that I have discussed with the patient the benefits, risks, side-effects, alternatives, likelihood of achieving goals, and potential problems during recovery for the procedure that I have provided informed consent. Date  Time: {CHL [facility]-PAIN TIME CHOICES:21018001}  Pre-Procedure Preparation:  Monitoring: As per clinic protocol. Respiration, ETCO2, SpO2, BP, heart rate and rhythm monitor placed and checked for adequate function Safety Precautions: Patient was assessed for positional comfort and pressure points before starting the procedure. Time-out: I initiated and conducted the "Time-out" before starting the procedure, as per protocol. The patient was asked to participate by confirming the accuracy of the "Time Out" information. Verification of the correct person, site, and procedure were performed and confirmed by me, the nursing staff, and the patient. "Time-out" conducted as per Joint Commission's Universal Protocol (UP.01.01.01). Time:    Description of Procedure:          Safety Precautions: Aspiration looking for blood return was conducted prior to all injections. At no point did we inject any substances, as a needle was being advanced. No attempts were made at seeking any paresthesias. Safe injection practices and needle disposal techniques used. Medications properly checked for expiration dates. SDV (single dose vial) medications used. Description of the Procedure: Protocol guidelines were followed. The patient was placed in position over the fluoroscopy table. The target area was identified and the area prepped in the usual manner. Skin & deeper tissues infiltrated with local  anesthetic. Appropriate amount of time allowed to pass for local anesthetics to take effect. The procedure needles were then advanced to the target area. Proper needle placement secured. Negative aspiration confirmed. Solution injected in intermittent fashion, asking for systemic symptoms every 0.5cc of injectate. The needles were then removed and the area cleansed, making sure to  leave some of the prepping solution back to take advantage of its long term bactericidal properties. There were no vitals filed for this visit.  Start Time:   hrs. End Time:   hrs. Materials:  Needle(s) Type: Spinal Needle Gauge: 22G Length: 5.0-in Medication(s): Please see orders for medications and dosing details.  Imaging Guidance (Non-Spinal):          Type of Imaging Technique: Fluoroscopy Guidance (Non-Spinal) Indication(s): Assistance in needle guidance and placement for procedures requiring needle placement in or near specific anatomical locations not easily accessible without such assistance. Exposure Time: Please see nurses notes. Contrast: Before injecting any contrast, we confirmed that the patient did not have an allergy to iodine, shellfish, or radiological contrast. Once satisfactory needle placement was completed at the desired level, radiological contrast was injected. Contrast injected under live fluoroscopy. No contrast complications. See chart for type and volume of contrast used. Fluoroscopic Guidance: I was personally present during the use of fluoroscopy. "Tunnel Vision Technique" used to obtain the best possible view of the target area. Parallax error corrected before commencing the procedure. "Direction-depth-direction" technique used to introduce the needle under continuous pulsed fluoroscopy. Once target was reached, antero-posterior, oblique, and lateral fluoroscopic projection used confirm needle placement in all planes. Images permanently stored in EMR. Interpretation: I personally interpreted  the imaging intraoperatively. Adequate needle placement confirmed in multiple planes. Appropriate spread of contrast into desired area was observed. No evidence of afferent or efferent intravascular uptake. Permanent images saved into the patient's record.  Antibiotic Prophylaxis:   Anti-infectives (From admission, onward)   None     Indication(s): None identified  Post-operative Assessment:  Post-procedure Vital Signs:  Pulse/HCG Rate:    Temp:   Resp:   BP:   SpO2:    EBL: None  Complications: No immediate post-treatment complications observed by team, or reported by patient.  Note: The patient tolerated the entire procedure well. A repeat set of vitals were taken after the procedure and the patient was kept under observation following institutional policy, for this type of procedure. Post-procedural neurological assessment was performed, showing return to baseline, prior to discharge. The patient was provided with post-procedure discharge instructions, including a section on how to identify potential problems. Should any problems arise concerning this procedure, the patient was given instructions to immediately contact us, at any time, without hesitation. In any case, we plan to contact the patient by telephone for a follow-up status report regarding this interventional procedure.  Comments:  No additional relevant information.  Plan of Care  Orders:  No orders of the defined types were placed in this encounter.  Chronic Opioid Analgesic:  Oxycodone IR 5 mg, 1 tab PO q 6 hrs (20 mg/day of oxycodone) MME/day: 30 mg/day.   Medications ordered for procedure: No orders of the defined types were placed in this encounter.  Medications administered: Chad Wilson had no medications administered during this visit.  See the medical record for exact dosing, route, and time of administration.  Follow-up plan:   No follow-ups on file.      Interventional Therapies  Risk  Complexity  Considerations:   WNL   Planned  Pending:   Diagnostic right IA hip injection #1    Under consideration:   Diagnostic right IA hip injection #1    Completed:   Therapeutic bilateral lumbar facet MBB x3  Therapeutic right lumbar facet RFA x1 (12/10/2018) (100/100/100/100) Therapeutic left lumbar facet RFA x1 (01/05/2019) (100/100/50/90-100)   Therapeutic  Palliative (PRN)  options:   Palliative bilateral lumbar facet MBB #4  Palliative right lumbar facet RFA #2  Palliative left lumbar facet RFA #2     Recent Visits Date Type Provider Dept  05/30/20 Office Visit Milinda Pointer, MD [facility]  04/05/20 Telemedicine Milinda Pointer, MD [facility]  Showing recent visits within past 90 days and meeting all other requirements Future Appointments Date Type Provider Dept  06/08/20 Appointment Milinda Pointer, MD [facility]  Showing future appointments within next 90 days and meeting all other requirements  Disposition: Discharge home  Discharge (Date  Time): 06/08/2020;   hrs.   Primary Care Physician: Dorothyann Peng, NP Location: [facility] Note by: [name], MD Date: 06/08/2020; Time: 1:23 PM  Disclaimer:  Medicine is not an Chief Strategy Officer. The only guarantee in medicine is that nothing is guaranteed. It is important to note that the decision to proceed with this intervention was based on the information collected from the patient. The Data and conclusions were drawn from the patient's questionnaire, the interview, and the physical examination. Because the information was provided in large part by the patient, it cannot be guaranteed that it has not been purposely or unconsciously manipulated. Every effort has been made to obtain as much relevant data as possible for this evaluation. It is important to note that the conclusions that lead to this procedure are derived in large part from the available data.  Always take into account that the treatment will also be dependent on availability of resources and existing treatment guidelines, considered by other Pain Management Practitioners as being common knowledge and practice, at the time of the intervention. For Medico-Legal purposes, it is also important to point out that variation in procedural techniques and pharmacological choices are the acceptable norm. The indications, contraindications, technique, and results of the above procedure should only be interpreted and judged by a Board-Certified Interventional Pain Specialist with extensive familiarity and expertise in the same exact procedure and technique.

## 2020-06-08 ENCOUNTER — Ambulatory Visit
Admission: RE | Admit: 2020-06-08 | Discharge: 2020-06-08 | Disposition: A | Discharge status: Home / Self Care | Payer: 59 | Source: Ambulatory Visit | Attending: Pain Medicine | Admitting: Pain Medicine

## 2020-06-08 ENCOUNTER — Ambulatory Visit: Payer: 59 | Admitting: Pain Medicine

## 2020-06-08 ENCOUNTER — Ambulatory Visit
Admission: RE | Admit: 2020-06-08 | Discharge: 2020-06-08 | Disposition: A | Discharge status: Home / Self Care | Payer: 59 | Attending: Pain Medicine | Admitting: Pain Medicine

## 2020-06-08 DIAGNOSIS — G8929 Other chronic pain: Secondary | ICD-10-CM | POA: Diagnosis not present

## 2020-06-08 DIAGNOSIS — M25551 Pain in right hip: Secondary | ICD-10-CM | POA: Diagnosis not present

## 2020-06-09 MED FILL — busPIRone HCL 5 MG TABS: 5 | 90 days supply | Qty: 180 | Fill #1

## 2020-06-14 ENCOUNTER — Other Ambulatory Visit (HOSPITAL_COMMUNITY): Payer: Self-pay | Admitting: Urology

## 2020-06-15 MED FILL — HYDROCODON-APAP 5-325: 5-325 | 7 days supply | Qty: 21 | Fill #0

## 2020-06-17 ENCOUNTER — Other Ambulatory Visit (HOSPITAL_COMMUNITY): Payer: Self-pay

## 2020-06-22 ENCOUNTER — Encounter: Payer: Self-pay | Admitting: Pain Medicine

## 2020-06-22 ENCOUNTER — Other Ambulatory Visit: Payer: Self-pay

## 2020-06-22 ENCOUNTER — Ambulatory Visit
Admission: RE | Admit: 2020-06-22 | Discharge: 2020-06-22 | Disposition: A | Discharge status: Home / Self Care | Payer: 59 | Source: Ambulatory Visit | Attending: Pain Medicine | Admitting: Pain Medicine

## 2020-06-22 ENCOUNTER — Ambulatory Visit (HOSPITAL_BASED_OUTPATIENT_CLINIC_OR_DEPARTMENT_OTHER): Payer: 59 | Admitting: Pain Medicine

## 2020-06-22 ENCOUNTER — Other Ambulatory Visit (HOSPITAL_COMMUNITY): Payer: Self-pay

## 2020-06-22 VITALS — BP 148/84 | HR 64 | Temp 97.2°F | Resp 18 | Ht 70.0 in | Wt 206.0 lb

## 2020-06-22 DIAGNOSIS — M76891 Other specified enthesopathies of right lower limb, excluding foot: Secondary | ICD-10-CM

## 2020-06-22 DIAGNOSIS — M25551 Pain in right hip: Secondary | ICD-10-CM

## 2020-06-22 DIAGNOSIS — M7071 Other bursitis of hip, right hip: Secondary | ICD-10-CM | POA: Diagnosis not present

## 2020-06-22 DIAGNOSIS — M7061 Trochanteric bursitis, right hip: Secondary | ICD-10-CM | POA: Insufficient documentation

## 2020-06-22 DIAGNOSIS — G8929 Other chronic pain: Secondary | ICD-10-CM | POA: Diagnosis not present

## 2020-06-22 MED ORDER — MIDAZOLAM HCL 5 MG/5ML IJ SOLN
1.0000 mg | INTRAMUSCULAR | Status: DC | PRN
Start: 2020-06-22 — End: 2020-06-22
  Administered 2020-06-22: 2 mg via INTRAVENOUS

## 2020-06-22 MED ORDER — IOHEXOL 180 MG/ML  SOLN
10.0000 mL | Freq: Once | INTRAMUSCULAR | Status: AC
  Administered 2020-06-22: 1 mL via INTRA_ARTICULAR

## 2020-06-22 MED ORDER — IOHEXOL 180 MG/ML  SOLN
INTRAMUSCULAR | Status: AC
  Filled 2020-06-22: qty 20

## 2020-06-22 MED ORDER — LIDOCAINE HCL 2 % IJ SOLN
20.0000 mL | Freq: Once | INTRAMUSCULAR | Status: AC
Start: 2020-06-22 — End: 2020-06-22
  Administered 2020-06-22: 400 mg

## 2020-06-22 MED ORDER — ROPIVACAINE HCL 2 MG/ML IJ SOLN
INTRAMUSCULAR | Status: AC
  Filled 2020-06-22: qty 10

## 2020-06-22 MED ORDER — METHYLPREDNISOLONE ACETATE 80 MG/ML IJ SUSP
INTRAMUSCULAR | Status: AC
  Filled 2020-06-22: qty 1

## 2020-06-22 MED ORDER — METHYLPREDNISOLONE ACETATE 80 MG/ML IJ SUSP
80.0000 mg | Freq: Once | INTRAMUSCULAR | Status: AC
  Administered 2020-06-22: 80 mg via INTRA_ARTICULAR

## 2020-06-22 MED ORDER — ROPIVACAINE HCL 2 MG/ML IJ SOLN
9.0000 mL | Freq: Once | INTRAMUSCULAR | Status: AC
  Administered 2020-06-22: 9 mL via INTRA_ARTICULAR

## 2020-06-22 MED ORDER — LACTATED RINGERS IV SOLN
1000.0000 mL | Freq: Once | INTRAVENOUS | Status: AC
  Administered 2020-06-22: 1000 mL via INTRAVENOUS

## 2020-06-22 MED ORDER — LIDOCAINE HCL 2 % IJ SOLN
INTRAMUSCULAR | Status: AC
  Filled 2020-06-22: qty 20

## 2020-06-22 MED ORDER — FENTANYL CITRATE (PF) 100 MCG/2ML IJ SOLN
25.0000 ug | INTRAMUSCULAR | Status: DC | PRN
Start: 2020-06-22 — End: 2020-06-22
  Administered 2020-06-22: 50 ug via INTRAVENOUS

## 2020-06-22 MED ORDER — FENTANYL CITRATE (PF) 100 MCG/2ML IJ SOLN
INTRAMUSCULAR | Status: AC
  Filled 2020-06-22: qty 2

## 2020-06-22 MED ORDER — MIDAZOLAM HCL 5 MG/5ML IJ SOLN
INTRAMUSCULAR | Status: AC
  Filled 2020-06-22: qty 5

## 2020-06-22 NOTE — Progress Notes (Signed)
Safety precautions to be maintained throughout the outpatient stay will include: orient to surroundings, keep bed in low position, maintain call bell within reach at all times, provide assistance with transfer out of bed and ambulation.  

## 2020-06-22 NOTE — Progress Notes (Signed)
PROVIDER NOTE: Information contained herein reflects review and annotations entered in association with encounter. Interpretation of such information and data should be left to medically-trained personnel. Information provided to patient can be located elsewhere in the medical record under "Patient Instructions". Document created using STT-dictation technology, any transcriptional errors that may result from process are unintentional.    Patient: Chad Wilson  Service Category: Procedure  Provider: [name], MD  DOB: Aug 13, 1975  DOS: 06/22/2020  Location: [facility]  MRN: [id number]  Setting: Ambulatory - outpatient  Referring Provider: Dorothyann Peng, NP  Type: Established Patient  Specialty: Interventional Pain Management  PCP: Dorothyann Peng, NP   Primary Reason for Visit: Interventional Pain Management Treatment. CC: Back Pain (low)  Procedure #1:  Anesthesia, Analgesia, Anxiolysis:  Type: Intra-Articular Hip Injection #1  Primary Purpose: Diagnostic Region: Posterolateral hip joint area. Level: Lower pelvic and hip joint level. Target Area: Superior aspect of the hip joint cavity, going thru the superior portion of the capsular ligament. Approach: Posterolateral approach. Laterality: Right  Type: Moderate (Conscious) Sedation combined with Local Anesthesia Indication(s): Analgesia and Anxiety Route: Intravenous (IV) IV Access: Secured Sedation: Meaningful verbal contact was maintained at all times during the procedure  Local Anesthetic: Lidocaine 1-2%  Position: Lateral Decubitus with bad side up Area Prepped: Entire Posterolateral hip area. DuraPrep (Iodine Povacrylex [0.7% available iodine] and Isopropyl Alcohol, 74% w/w)   Procedure #2:    Type: Gluteofemoral and greater trochanteric Bursa Injection #1  Primary Purpose: Diagnostic Region: Upper (proximal) Femoral Region Level: Hip Joint Target Area: Superior aspect of the hip joint cavity, going thru  the superior portion of the capsular ligament. Approach: Posterolateral approach Laterality: Right     Indications: 1. Chronic hip pain (Right)   2. Enthesopathy of hip region (Right)   3. Greater trochanteric bursitis (Right)   4. Other bursitis of hip (Right)    Pain Score: Pre-procedure: 6 /10 Post-procedure: 0-No pain/10   Pre-op H&P Assessment:  Chad Wilson is a 45 y.o. (year old), male patient, seen today for interventional treatment. He  has a past surgical history that includes Multiple tooth extractions (15 yrs. ago); Anterior cervical decomp/discectomy fusion (N/A, 05/21/2017); and Wisdom tooth extraction. Chad Wilson has a current medication list which includes the following prescription(s): albuterol, alprazolam, atorvastatin, buspirone, colchicine, depo-testosterone, hydrocodone-acetaminophen, [START ON 06/27/2020] hydrocodone-acetaminophen, levothyroxine, lisinopril, nabumetone, nitroglycerin, pantoprazole, pregabalin, sertraline, sildenafil, sildenafil, sildenafil, testosterone cypionate, and [DISCONTINUED] omeprazole, and the following Facility-Administered Medications: fentanyl and midazolam. His primarily concern today is the Back Pain (low)  Initial Vital Signs:  Pulse/HCG Rate: 72ECG Heart Rate: 96 Temp: (!) 97.1 F (36.2 C) Resp: 18 BP: (!) 161/94 SpO2: 96 %  BMI: Estimated body mass index is 29.56 kg/m as calculated from the following:   Height as of this encounter: 5' 10"  (1.778 m).   Weight as of this encounter: 206 lb (93.4 kg).  Risk Assessment: Allergies: Reviewed. He is allergic to chantix [varenicline].  Allergy Precautions: None required Coagulopathies: Reviewed. None identified.  Blood-thinner therapy: None at this time Active Infection(s): Reviewed. None identified. Chad Wilson is afebrile  Site Confirmation: Chad Wilson was asked to confirm the procedure and laterality before marking the site Procedure checklist: Completed Consent: Before the procedure and  under the influence of no sedative(s), amnesic(s), or anxiolytics, the patient was informed of the treatment options, risks and possible complications. To fulfill our ethical and legal obligations, as recommended by the American Medical Association's Code of Ethics, I have informed  the patient of my clinical impression; the nature and purpose of the treatment or procedure; the risks, benefits, and possible complications of the intervention; the alternatives, including doing nothing; the risk(s) and benefit(s) of the alternative treatment(s) or procedure(s); and the risk(s) and benefit(s) of doing nothing. The patient was provided information about the general risks and possible complications associated with the procedure. These may include, but are not limited to: failure to achieve desired goals, infection, bleeding, organ or nerve damage, allergic reactions, paralysis, and death. In addition, the patient was informed of those risks and complications associated to the procedure, such as failure to decrease pain; infection; bleeding; organ or nerve damage with subsequent damage to sensory, motor, and/or autonomic systems, resulting in permanent pain, numbness, and/or weakness of one or several areas of the body; allergic reactions; (i.e.: anaphylactic reaction); and/or death. Furthermore, the patient was informed of those risks and complications associated with the medications. These include, but are not limited to: allergic reactions (i.e.: anaphylactic or anaphylactoid reaction(s)); adrenal axis suppression; blood sugar elevation that in diabetics may result in ketoacidosis or comma; water retention that in patients with history of congestive heart failure may result in shortness of breath, pulmonary edema, and decompensation with resultant heart failure; weight gain; swelling or edema; medication-induced neural toxicity; particulate matter embolism and blood vessel occlusion with resultant organ, and/or  nervous system infarction; and/or aseptic necrosis of one or more joints. Finally, the patient was informed that Medicine is not an exact science; therefore, there is also the possibility of unforeseen or unpredictable risks and/or possible complications that may result in a catastrophic outcome. The patient indicated having understood very clearly. We have given the patient no guarantees and we have made no promises. Enough time was given to the patient to ask questions, all of which were answered to the patient's satisfaction. Mr. Snedden has indicated that he wanted to continue with the procedure. Attestation: I, the ordering provider, attest that I have discussed with the patient the benefits, risks, side-effects, alternatives, likelihood of achieving goals, and potential problems during recovery for the procedure that I have provided informed consent. Date  Time: 06/22/2020  8:08 AM  Pre-Procedure Preparation:  Monitoring: As per clinic protocol. Respiration, ETCO2, SpO2, BP, heart rate and rhythm monitor placed and checked for adequate function Safety Precautions: Patient was assessed for positional comfort and pressure points before starting the procedure. Time-out: I initiated and conducted the "Time-out" before starting the procedure, as per protocol. The patient was asked to participate by confirming the accuracy of the "Time Out" information. Verification of the correct person, site, and procedure were performed and confirmed by me, the nursing staff, and the patient. "Time-out" conducted as per Joint Commission's Universal Protocol (UP.01.01.01). Time: 0839  Description of Procedure #1:  Safety Precautions: Aspiration looking for blood return was conducted prior to all injections. At no point did we inject any substances, as a needle was being advanced. No attempts were made at seeking any paresthesias. Safe injection practices and needle disposal techniques used. Medications properly checked for  expiration dates. SDV (single dose vial) medications used. Description of the Procedure: Protocol guidelines were followed. The patient was placed in position over the fluoroscopy table. The target area was identified and the area prepped in the usual manner. Skin & deeper tissues infiltrated with local anesthetic. Appropriate amount of time allowed to pass for local anesthetics to take effect. The procedure needles were then advanced to the target area. Proper needle placement secured. Negative aspiration  confirmed. Solution injected in intermittent fashion, asking for systemic symptoms every 0.5cc of injectate. The needles were then removed and the area cleansed, making sure to leave some of the prepping solution back to take advantage of its long term bactericidal properties.  Start Time: 0839 hrs. Materials:  Needle(s) Type: Spinal Needle Gauge: 22G Length: 5.0-in Medication(s): Please see orders for medications and dosing details.  Imaging Guidance (Non-Spinal):          Type of Imaging Technique: Fluoroscopy Guidance (Non-Spinal) Indication(s): Assistance in needle guidance and placement for procedures requiring needle placement in or near specific anatomical locations not easily accessible without such assistance. Exposure Time: Please see nurses notes. Contrast: Before injecting any contrast, we confirmed that the patient did not have an allergy to iodine, shellfish, or radiological contrast. Once satisfactory needle placement was completed at the desired level, radiological contrast was injected. Contrast injected under live fluoroscopy. No contrast complications. See chart for type and volume of contrast used. Fluoroscopic Guidance: I was personally present during the use of fluoroscopy. "Tunnel Vision Technique" used to obtain the best possible view of the target area. Parallax error corrected before commencing the procedure. "Direction-depth-direction" technique used to introduce the  needle under continuous pulsed fluoroscopy. Once target was reached, antero-posterior, oblique, and lateral fluoroscopic projection used confirm needle placement in all planes. Images permanently stored in EMR. Interpretation: I personally interpreted the imaging intraoperatively. Adequate needle placement confirmed in multiple planes. Appropriate spread of contrast into desired area was observed. No evidence of afferent or efferent intravascular uptake. Permanent images saved into the patient's record.   Description of Procedure #2:  Description of the Procedure: Skin & deeper tissues infiltrated with local anesthetic. Appropriate amount of time allowed to pass for local anesthetics to take effect. The procedure needles were then advanced to the target area. Proper needle placement secured. Negative aspiration confirmed. Solution injected in intermittent fashion, asking for systemic symptoms every 0.5cc of injectate. The needles were then removed and the area cleansed, making sure to leave some of the prepping solution back to take advantage of its long term bactericidal properties.  Vitals:   06/22/20 0845 06/22/20 0853 06/22/20 0903 06/22/20 0913  BP: (!) 149/81 134/87 (!) 145/90 (!) 148/84  Pulse: 64     Resp: 13 16 16 18   Temp:  (!) 97.2 F (36.2 C)  (!) 97.2 F (36.2 C)  TempSrc:  Temporal  Temporal  SpO2: 96% 97% 96% 95%  Weight:      Height:         End Time: 0845 hrs.      Materials:  Needle(s) Type: Spinal Needle Gauge: 22G Length: 5.0-in Medication(s): Please see orders for medications and dosing details.  Imaging Guidance (Non-Spinal):          Type of Imaging Technique: Fluoroscopy Guidance (Non-Spinal) Indication(s): Assistance in needle guidance and placement for procedures requiring needle placement in or near specific anatomical locations not easily accessible without such assistance. Exposure Time: Please see nurses notes. Contrast: Before injecting any contrast,  we confirmed that the patient did not have an allergy to iodine, shellfish, or radiological contrast. Once satisfactory needle placement was completed at the desired level, radiological contrast was injected. Contrast injected under live fluoroscopy. No contrast complications. See chart for type and volume of contrast used. Fluoroscopic Guidance: I was personally present during the use of fluoroscopy. "Tunnel Vision Technique" used to obtain the best possible view of the target area. Parallax error corrected before commencing the procedure. "  Direction-depth-direction" technique used to introduce the needle under continuous pulsed fluoroscopy. Once target was reached, antero-posterior, oblique, and lateral fluoroscopic projection used confirm needle placement in all planes. Images permanently stored in EMR. Interpretation: I personally interpreted the imaging intraoperatively. Adequate needle placement confirmed in multiple planes. Appropriate spread of contrast into desired area was observed. No evidence of afferent or efferent intravascular uptake. Permanent images saved into the patient's record.  Antibiotic Prophylaxis:   Anti-infectives (From admission, onward)   None     Indication(s): None identified  Post-operative Assessment:  Post-procedure Vital Signs:  Pulse/HCG Rate: 64 (nsr)67 Temp: (!) 97.2 F (36.2 C) Resp: 18 BP: (!) 148/84 SpO2: 95 %  EBL: None  Complications: No immediate post-treatment complications observed by team, or reported by patient.  Note: The patient tolerated the entire procedure well. A repeat set of vitals were taken after the procedure and the patient was kept under observation following institutional policy, for this type of procedure. Post-procedural neurological assessment was performed, showing return to baseline, prior to discharge. The patient was provided with post-procedure discharge instructions, including a section on how to identify potential  problems. Should any problems arise concerning this procedure, the patient was given instructions to immediately contact us, at any time, without hesitation. In any case, we plan to contact the patient by telephone for a follow-up status report regarding this interventional procedure.  Comments:  No additional relevant information.  Plan of Care  Orders:  Orders Placed This Encounter  Procedures  . HIP INJECTION    Scheduling Instructions:     Side: Right-sided     Sedation: With Sedation.     Timeframe: Today  . DG PAIN CLIN[facility] C-ARM 1-60 MIN NO REPORT    Intraoperative interpretation by procedural physician at [facility].    Standing Status:   Standing    Number of Occurrences:   1    Order Specific Question:   Reason for exam:    Answer:   Assistance in needle guidance and placement for procedures requiring needle placement in or near specific anatomical locations not easily accessible without such assistance.  . Informed Consent Details: Physician/Practitioner Attestation; Transcribe to consent form and obtain patient signature    Nursing Order: Transcribe to consent form and obtain patient signature. Note: Always confirm laterality of pain with Mr. Fraizer, before procedure.    Order Specific Question:   Physician/Practitioner attestation of informed consent for procedure/surgical case    Answer:   I, the physician/practitioner, attest that I have discussed with the patient the benefits, risks, side effects, alternatives, likelihood of achieving goals and potential problems during recovery for the procedure that I have provided informed consent.    Order Specific Question:   Procedure    Answer:   Hip injection    Order Specific Question:   Physician/Practitioner performing the procedure    Answer:   Araeya Lamb A. Dossie Arbour, MD    Order Specific Question:   Indication/Reason    Answer:   Hip Joint Pain (Arthralgia)  . Provide equipment / supplies at bedside    "Block Tray"  (Disposable  single use) Needle type: SpinalSpinal Amount/quantity: 1 Size: Medium (5-inch) Gauge: 22G    Standing Status:   Standing    Number of Occurrences:   1    Order Specific Question:   Specify    Answer:   Block Tray   Chronic Opioid Analgesic:  Oxycodone IR 5 mg, 1 tab PO q 6 hrs (20 mg/day of oxycodone)  MME/day: 30 mg/day.   Medications ordered for procedure: Meds ordered this encounter  Medications  . iohexol (OMNIPAQUE) 180 MG/ML injection 10 mL    Must be Myelogram-compatible. If not available, you may substitute with a water-soluble, non-ionic, hypoallergenic, myelogram-compatible radiological contrast medium.  Marland Kitchen lidocaine (XYLOCAINE) 2 % (with pres) injection 400 mg  . lactated ringers infusion 1,000 mL  . midazolam (VERSED) 5 MG/5ML injection 1-2 mg    Make sure Flumazenil is available in the pyxis when using this medication. If oversedation occurs, administer 0.2 mg IV over 15 sec. If after 45 sec no response, administer 0.2 mg again over 1 min; may repeat at 1 min intervals; not to exceed 4 doses (1 mg)  . fentaNYL (SUBLIMAZE) injection 25-50 mcg    Make sure Narcan is available in the pyxis when using this medication. In the event of respiratory depression (RR< 8/min): Titrate NARCAN (naloxone) in increments of 0.1 to 0.2 mg IV at 2-3 minute intervals, until desired degree of reversal.  . ropivacaine (PF) 2 mg/mL (0.2%) (NAROPIN) injection 9 mL  . methylPREDNISolone acetate (DEPO-MEDROL) injection 80 mg   Medications administered: We administered iohexol, lidocaine, lactated ringers, midazolam, fentaNYL, ropivacaine (PF) 2 mg/mL (0.2%), and methylPREDNISolone acetate.  See the medical record for exact dosing, route, and time of administration.  Follow-up plan:   Return in about 2 weeks (around 07/06/2020) for on afternoon of procedure day, (VV), (PPE).      Interventional Therapies  Risk  Complexity Considerations:   WNL   Planned  Pending:   Diagnostic  right IA hip injection #1    Under consideration:   Diagnostic right IA hip injection #1    Completed:   Therapeutic bilateral lumbar facet MBB x3  (09/16/2019) (100/100/50/90-100)  Therapeutic right lumbar facet RFA x2 (01/18/2020) (100/100/100/100)  Therapeutic left lumbar facet RFA x2 (02/24/2020) (100/100/50/90-100)    Therapeutic  Palliative (PRN) options:   Palliative bilateral lumbar facet MBB #4  Palliative right lumbar facet RFA #3  Palliative left lumbar facet RFA #3     Recent Visits Date Type Provider Dept  05/30/20 Office Visit Milinda Pointer, MD [facility]  04/05/20 Telemedicine Milinda Pointer, MD [facility]  Showing recent visits within past 90 days and meeting all other requirements Today's Visits Date Type Provider Dept  06/22/20 Procedure visit Milinda Pointer, MD [facility]  Showing today's visits and meeting all other requirements Future Appointments Date Type Provider Dept  07/13/20 Appointment Milinda Pointer, MD [facility]  Showing future appointments within next 90 days and meeting all other requirements  Disposition: Discharge home  Discharge (Date  Time): 06/22/2020; 0914 hrs.   Primary Care Physician: Dorothyann Peng, NP Location: [facility] Note by: [name], MD Date: 06/22/2020; Time: 10:15 AM  Disclaimer:  Medicine is not an Chief Strategy Officer. The only guarantee in medicine is that nothing is guaranteed. It is important to note that the decision to proceed with this intervention was based on the information collected from the patient. The Data and conclusions were drawn from the patient's questionnaire, the interview, and the physical examination. Because the information was provided in large part by the patient, it cannot be guaranteed that it has not been purposely or unconsciously manipulated. Every effort has been made to obtain as much relevant data as  possible for this evaluation. It is important to note that the conclusions that lead to this procedure are derived in large part from the  available data. Always take into account that the treatment will also be dependent on availability of resources and existing treatment guidelines, considered by other Pain Management Practitioners as being common knowledge and practice, at the time of the intervention. For Medico-Legal purposes, it is also important to point out that variation in procedural techniques and pharmacological choices are the acceptable norm. The indications, contraindications, technique, and results of the above procedure should only be interpreted and judged by a Board-Certified Interventional Pain Specialist with extensive familiarity and expertise in the same exact procedure and technique.

## 2020-06-22 NOTE — Patient Instructions (Signed)

## 2020-06-23 ENCOUNTER — Telehealth: Payer: Self-pay

## 2020-06-23 ENCOUNTER — Other Ambulatory Visit (HOSPITAL_COMMUNITY): Payer: Self-pay

## 2020-06-23 NOTE — Telephone Encounter (Signed)
Post procedure phone call. Patient states he is doing well.

## 2020-06-27 ENCOUNTER — Encounter: Payer: Self-pay | Admitting: Pain Medicine

## 2020-06-27 ENCOUNTER — Telehealth: Payer: Self-pay | Admitting: Pain Medicine

## 2020-06-27 ENCOUNTER — Other Ambulatory Visit: Payer: Self-pay | Admitting: Pain Medicine

## 2020-06-27 ENCOUNTER — Other Ambulatory Visit (HOSPITAL_COMMUNITY): Payer: Self-pay

## 2020-06-27 ENCOUNTER — Other Ambulatory Visit (HOSPITAL_BASED_OUTPATIENT_CLINIC_OR_DEPARTMENT_OTHER): Payer: Self-pay

## 2020-06-27 ENCOUNTER — Other Ambulatory Visit: Payer: Self-pay | Admitting: Adult Health

## 2020-06-27 DIAGNOSIS — Z79899 Other long term (current) drug therapy: Secondary | ICD-10-CM

## 2020-06-27 DIAGNOSIS — G894 Chronic pain syndrome: Secondary | ICD-10-CM

## 2020-06-27 DIAGNOSIS — F419 Anxiety disorder, unspecified: Secondary | ICD-10-CM

## 2020-06-27 MED ORDER — HYDROCODONE-ACETAMINOPHEN 5-325 MG PO TABS
1.0000 | ORAL_TABLET | Freq: Every day | ORAL | 0 refills | Status: DC
  Filled 2020-06-27: qty 7, 7d supply, fill #0

## 2020-06-27 NOTE — Telephone Encounter (Signed)
Dr Lowella Dandy resent in the prescription and the patient was notified.

## 2020-06-27 NOTE — Progress Notes (Signed)
The patient's last prescription of taper had to be resent to the [facility] outpatient pharmacy since they claim that the prescription had been canceled by me.  I have looked at the report on the prescription and nowhere says that he had been cancel by anybody, including me.  Today I have sent a second prescription to deal with the problems that have come up as a consequence of the colon pharmacy during the epic system.

## 2020-06-27 NOTE — Telephone Encounter (Signed)
Spoke with [facility] and they state that Dr Dossie Arbour discontinued all of the Hydrocodone prescriptions.  I cant see this on my medication list, they still look active.  Patient is out of medications. Will notify Dr Dossie Arbour.

## 2020-06-28 ENCOUNTER — Other Ambulatory Visit (HOSPITAL_COMMUNITY): Payer: Self-pay

## 2020-06-28 MED ORDER — BUSPIRONE HCL 5 MG PO TABS
5.0000 mg | ORAL_TABLET | Freq: Two times a day (BID) | ORAL | 0 refills | Status: DC
  Filled 2020-06-28 – 2021-05-08 (×2): qty 180, 90d supply, fill #0

## 2020-06-30 ENCOUNTER — Other Ambulatory Visit (HOSPITAL_COMMUNITY): Payer: Self-pay

## 2020-06-30 ENCOUNTER — Other Ambulatory Visit: Payer: Self-pay | Admitting: Urology

## 2020-06-30 MED FILL — Sildenafil Citrate Tab 20 MG: ORAL | 18 days supply | Qty: 90 | Fill #0 | Status: AC

## 2020-07-03 ENCOUNTER — Other Ambulatory Visit: Payer: Self-pay | Admitting: Urology

## 2020-07-03 ENCOUNTER — Other Ambulatory Visit (HOSPITAL_COMMUNITY): Payer: Self-pay

## 2020-07-03 MED FILL — Alprazolam Tab 0.5 MG: ORAL | 30 days supply | Qty: 60 | Fill #0 | Status: AC

## 2020-07-03 MED FILL — Pantoprazole Sodium EC Tab 40 MG (Base Equiv): ORAL | 30 days supply | Qty: 30 | Fill #0 | Status: AC

## 2020-07-07 ENCOUNTER — Other Ambulatory Visit (HOSPITAL_COMMUNITY): Payer: Self-pay

## 2020-07-07 ENCOUNTER — Other Ambulatory Visit: Payer: Self-pay | Admitting: Urology

## 2020-07-10 ENCOUNTER — Other Ambulatory Visit (HOSPITAL_COMMUNITY): Payer: Self-pay

## 2020-07-11 ENCOUNTER — Other Ambulatory Visit (HOSPITAL_COMMUNITY): Payer: Self-pay

## 2020-07-11 ENCOUNTER — Other Ambulatory Visit: Payer: Self-pay | Admitting: Urology

## 2020-07-12 ENCOUNTER — Other Ambulatory Visit (HOSPITAL_COMMUNITY): Payer: Self-pay

## 2020-07-12 ENCOUNTER — Encounter: Payer: Self-pay | Admitting: Pain Medicine

## 2020-07-12 MED FILL — Lisinopril Tab 40 MG: ORAL | 90 days supply | Qty: 90 | Fill #0 | Status: CN

## 2020-07-12 MED FILL — Atorvastatin Calcium Tab 40 MG (Base Equivalent): ORAL | 90 days supply | Qty: 90 | Fill #0 | Status: AC

## 2020-07-13 ENCOUNTER — Ambulatory Visit: Payer: 59 | Attending: Pain Medicine | Admitting: Pain Medicine

## 2020-07-13 ENCOUNTER — Other Ambulatory Visit: Payer: Self-pay

## 2020-07-13 ENCOUNTER — Other Ambulatory Visit (HOSPITAL_COMMUNITY): Payer: Self-pay

## 2020-07-13 DIAGNOSIS — M7918 Myalgia, other site: Secondary | ICD-10-CM

## 2020-07-13 DIAGNOSIS — M542 Cervicalgia: Secondary | ICD-10-CM

## 2020-07-13 DIAGNOSIS — M4310 Spondylolisthesis, site unspecified: Secondary | ICD-10-CM

## 2020-07-13 DIAGNOSIS — M76891 Other specified enthesopathies of right lower limb, excluding foot: Secondary | ICD-10-CM

## 2020-07-13 DIAGNOSIS — Z79899 Other long term (current) drug therapy: Secondary | ICD-10-CM

## 2020-07-13 DIAGNOSIS — G894 Chronic pain syndrome: Secondary | ICD-10-CM | POA: Diagnosis not present

## 2020-07-13 DIAGNOSIS — G8928 Other chronic postprocedural pain: Secondary | ICD-10-CM

## 2020-07-13 DIAGNOSIS — G8929 Other chronic pain: Secondary | ICD-10-CM

## 2020-07-13 DIAGNOSIS — M545 Low back pain, unspecified: Secondary | ICD-10-CM

## 2020-07-13 DIAGNOSIS — M25551 Pain in right hip: Secondary | ICD-10-CM | POA: Diagnosis not present

## 2020-07-13 DIAGNOSIS — M7071 Other bursitis of hip, right hip: Secondary | ICD-10-CM

## 2020-07-13 DIAGNOSIS — M47816 Spondylosis without myelopathy or radiculopathy, lumbar region: Secondary | ICD-10-CM | POA: Diagnosis not present

## 2020-07-13 DIAGNOSIS — Z9889 Other specified postprocedural states: Secondary | ICD-10-CM

## 2020-07-13 DIAGNOSIS — Z79891 Long term (current) use of opiate analgesic: Secondary | ICD-10-CM

## 2020-07-13 MED ORDER — TIZANIDINE HCL 2 MG PO TABS
2.0000 mg | ORAL_TABLET | Freq: Every day | ORAL | 0 refills | Status: DC
  Filled 2020-07-13: qty 60, 30d supply, fill #0

## 2020-07-13 MED ORDER — HYDROCODONE-ACETAMINOPHEN 5-325 MG PO TABS
1.0000 | ORAL_TABLET | Freq: Three times a day (TID) | ORAL | 0 refills | Status: DC | PRN
  Filled 2020-07-13: qty 90, 30d supply, fill #0

## 2020-07-13 NOTE — Patient Instructions (Signed)
Tizanidine Oral Tablets or Capsules What is this medicine? TIZANIDINE (tye ZAN i deen) helps to relieve muscle spasms. It may be used in people with multiple sclerosis or spinal cord injury. This medicine may be used for other purposes; ask your health care provider or pharmacist if you have questions. COMMON BRAND NAME(S): Zanaflex What should I tell my health care provider before I take this medicine? They need to know if you have any of these conditions:  kidney disease  liver disease  low blood pressure  mental health disease  an unusual or allergic reaction to tizanidine, other medicines, foods, dyes, or preservatives  pregnant or trying to get pregnant  breast-feeding How should I use this medicine? Take this medicine by mouth with water. Take it as directed on the prescription label. You can take it with or without food. You should always take it the same way. Keep taking this medicine unless your health care provider tells you to stop. Stopping it too quickly can cause serious side effects. Talk to your health care provider about the use of this medicine in children. Special care may be needed. Patients over 3 years of age may have a stronger reaction and need a smaller dose. Overdosage: If you think you have taken too much of this medicine contact a poison control center or emergency room at once. NOTE: This medicine is only for you. Do not share this medicine with others. What if I miss a dose? If you miss a dose, take it as soon as you can. If it is almost time for your next dose, take only that dose. Do not take double or extra doses. What may interact with this medicine? Do not take this medicine with any of the following medications:  ciprofloxacin  fluvoxamine  narcotic medicines for cough  thiabendazole  viloxazine This medicine may also interact with the following medications:  acyclovir  alcohol  antihistamines for allergy, cough, and  cold  baclofen  birth control pills  certain medicines for anxiety or sleep  certain medicines for blood pressure, heart disease, irregular heartbeat like amiodarone, mexiletine, propafenone, verapamil  certain medicines for depression like amitriptyline, fluoxetine, sertraline  certain medicines for seizures like phenobarbital, primidone  cimetidine  clonidine  famotidine  guanfacine  general anesthetics like halothane, isoflurane, methoxyflurane, propofol  medicines for sleep  medicines that relax muscles for surgery  methyldopa  narcotic medicines for pain  phenothiazines like chlorpromazine, prochlorperazine, thioridazine  ticlopidine  zileuton This list may not describe all possible interactions. Give your health care provider a list of all the medicines, herbs, non-prescription drugs, or dietary supplements you use. Also tell them if you smoke, drink alcohol, or use illegal drugs. Some items may interact with your medicine. What should I watch for while using this medicine? Visit your health care provider for regular checks on your progress. Tell your health care provider if your symptoms do not start to get better or if they get worse. You may get drowsy or dizzy. Do not drive, use machinery, or do anything that needs mental alertness until you know how this medicine affects you. Do not stand up or sit up quickly, especially if you are an older patient. This reduces the risk of dizzy or fainting spells. Alcohol may interfere with the effects of this medicine. Avoid alcoholic drinks. Your mouth may get dry. Chewing sugarless gum or sucking hard candy and drinking plenty of water may help. Contact your health care provider if the problem does not  go away or is severe. What side effects may I notice from receiving this medicine? Side effects that you should report to your doctor or health care professional as soon as possible:  allergic reactions like skin rash,  itching or hives, swelling of the face, lips, or tongue  confusion  hallucinations  liver injury (dark yellow or brown urine; general ill feeling or flu-like symptoms; loss of appetite, right upper belly pain; unusually weak or tired, yellowing of the eyes or skin)  loss of contact with reality  low blood pressure (dizziness; feeling faint or lightheaded, falls; unusually weak or tired)  trouble breathing  slow or irregular heartbeat Side effects that usually do not require medical attention (report to your doctor or health care professional if they continue or are bothersome):  blurred vision  constipation  dizziness  drowsiness  dry mouth  tiredness This list may not describe all possible side effects. Call your doctor for medical advice about side effects. You may report side effects to FDA at 1-800-FDA-1088. Where should I keep my medicine? Keep out of the reach of children and pets. Store at room temperature between 15 and 30 degrees C (59 and 86 degrees F). Get rid of any unused medicine after the expiration date. To get rid of medicines that are no longer needed or have expired:  Take the medicine to a medicine take-back program. Check with your pharmacy or law enforcement to find a location.  If you cannot return the medicine, check the label or package insert to see if the medicine should be thrown out in the garbage or flushed down the toilet. If you are not sure, ask your health care provider. If it is safe to put it in the trash, take the medicine out of the container. Mix the medicine with cat litter, dirt, coffee grounds, or other unwanted substance. Seal the mixture in a bag or container. Put it in the trash. NOTE: This sheet is a summary. It may not cover all possible information. If you have questions about this medicine, talk to your doctor, pharmacist, or health care provider.  2021 Elsevier/Gold Standard (2019-06-30 16:44:13)

## 2020-07-13 NOTE — Progress Notes (Signed)
Patient: Chad Wilson  Service Category: E/M  Provider: [name], MD  DOB: [birth year]  DOS: 07/13/2020  Location: Office  MRN: [id number]  Setting: Ambulatory outpatient  Referring Provider: Dorothyann Peng, NP  Type: Established Patient  Specialty: Interventional Pain Management  PCP: Dorothyann Peng, NP  Location: Remote location  Delivery: TeleHealth     Virtual Encounter - Pain Management PROVIDER NOTE: Information contained herein reflects review and annotations entered in association with encounter. Interpretation of such information and data should be left to medically-trained personnel. Information provided to patient can be located elsewhere in the medical record under "Patient Instructions". Document created using STT-dictation technology, any transcriptional errors that may result from process are unintentional.    Contact & Pharmacy Preferred: [phone] Home: [phone] (home) Mobile: [phone] (mobile) E-mail: [email or web address] .[place] [address] [place] [address] Phone: [phone] Fax: [phone]   Pre-screening  [name] offered "in-person" vs "virtual" encounter. He indicated preferring virtual for this encounter.   Reason COVID-19*  Social distancing based on CDC and AMA recommendations.   I contacted Chad Wilson on 07/13/2020 via telephone.      I clearly identified myself as [name], MD. I verified that I was speaking with the correct person using two identifiers (Name: Chad Wilson, and date of birth: [birth year]).  Consent I sought verbal advanced consent from Chad Wilson for virtual visit interactions. I informed Chad Wilson of possible security and privacy concerns, risks, and limitations associated with providing "not-in-person" medical evaluation and management services. I also informed Chad Wilson of the availability of "in-person" appointments. Finally, I informed him that there would be a  charge for the virtual visit and that he could be  personally, fully or partially, financially responsible for it. Chad Wilson expressed understanding and agreed to proceed.   Historic Elements   Chad Wilson is a 45 y.o. year old, male patient evaluated today after our last contact on 06/27/2020. Chad Wilson  has a past medical history of Acute hepatitis C without mention of hepatic coma(070.51), Acute postoperative pain (12/10/2018), Alcohol abuse, unspecified, Anxiety and depression, Anxiety states, Essential hypertension, benign, GERD (gastroesophageal reflux disease), Graves disease, Panic disorder without agoraphobia, Thyroid disease, and Tobacco use disorder. He also  has a past surgical history that includes Multiple tooth extractions (15 yrs. ago); Anterior cervical decomp/discectomy fusion (N/A, 05/21/2017); and Wisdom tooth extraction. Chad Wilson has a current medication list which includes the following prescription(s): albuterol, alprazolam, atorvastatin, buspirone, colchicine, depo-testosterone, hydrocodone-acetaminophen, levothyroxine, lisinopril, nabumetone, nitroglycerin, pantoprazole, pregabalin, sertraline, sildenafil, sildenafil, sildenafil, testosterone cypionate, tizanidine, and [DISCONTINUED] omeprazole. He  reports that he has been smoking cigarettes. He has a 30.00 pack-year smoking history. His smokeless tobacco use includes chew. He reports current alcohol use of about 42.0 standard drinks of alcohol per week. He reports previous drug use. Chad Wilson is allergic to chantix [varenicline].   HPI  Today, he is being contacted for a post-procedure assessment.  Post-Procedure Evaluation  Procedure (06/22/2020): Diagnostic right IA hip injection #1 + right gluteal femoral and greater trochanteric bursa injection #1 under fluoroscopic guidance and IV sedation Pre-procedure pain level: 6/10 Post-procedure: 0/10 (100% relief)  Sedation: Sedation provided.  Effectiveness during initial hour after  procedure(Ultra-Short Term Relief): 75 %.  Local anesthetic used: Long-acting (4-6 hours) Effectiveness: Defined as any analgesic benefit obtained secondary to the administration of local anesthetics. This carries significant diagnostic value as to the etiological location, or anatomical origin, of  the pain. Duration of benefit is expected to coincide with the duration of the local anesthetic used.  Effectiveness during initial 4-6 hours after procedure(Short-Term Relief): 75 %.  Long-term benefit: Defined as any relief past the pharmacologic duration of the local anesthetics.  Effectiveness past the initial 6 hours after procedure(Long-Term Relief): 75 % (for a couple of weeks).  Current benefits: Defined as benefit that persist at this time.   Analgesia:  He refers still having more than 54% benefit in the area of the right hip however, he has began to experience some pain in the left side of his lower back and he refers that this is more towards the end of the day depending on how much work he does.  Function: Somewhat improved ROM: Somewhat improved  Pharmacotherapy Assessment  Analgesic: Hydrocodone/APAP 5/325, 1 tab p.o. every 8 hours (15 mg/day of hydrocodone) MME/day: 15 mg/day.   Monitoring: [place] PMP: PDMP reviewed during this encounter.       Pharmacotherapy: No side-effects or adverse reactions reported. Compliance: No problems identified. Effectiveness: Clinically acceptable. Plan: Refer to "POC".  UDS:  Summary  Date Value Ref Range Status  08/23/2019 Note  Final    Comment:    ==================================================================== [employer] Select 13 (MW) ==================================================================== Test                             Result       Flag       Units  Drug Present and Declared for Prescription Verification   7-aminoclonazepam              36           EXPECTED   ng/mg creat    7-aminoclonazepam is an expected metabolite  of clonazepam. Source of    clonazepam is a scheduled prescription medication.    Oxycodone                      427          EXPECTED   ng/mg creat   Oxymorphone                    665          EXPECTED   ng/mg creat   Noroxycodone                   1768         EXPECTED   ng/mg creat   Noroxymorphone                 429          EXPECTED   ng/mg creat    Sources of oxycodone are scheduled prescription medications.    Oxymorphone, noroxycodone, and noroxymorphone are expected    metabolites of oxycodone. Oxymorphone is also available as a    scheduled prescription medication.  Drug Present not Declared for Prescription Verification   Alprazolam                     29           UNEXPECTED ng/mg creat   Alpha-hydroxyalprazolam        49           UNEXPECTED ng/mg creat    Source of alprazolam is a scheduled prescription medication. Alpha-    hydroxyalprazolam is an expected metabolite of alprazolam.  ==================================================================== Test  Result    Flag   Units      Ref Range   Creatinine              146              mg/dL      >=20 ==================================================================== Declared Medications:  The flagging and interpretation on this report are based on the  following declared medications.  Unexpected results may arise from  inaccuracies in the declared medications.   **Note: The testing scope of this panel includes these medications:   Clonazepam (Klonopin)  Oxycodone (Roxicodone)   **Note: The testing scope of this panel does not include the  following reported medications:   Albuterol (Ventolin HFA)  Atorvastatin (Lipitor)  Buspirone (Buspar)  Colchicine  Levothyroxine (Synthroid)  Lisinopril (Zestril)  Omeprazole (Prilosec)  Pantoprazole (Protonix)  Pregabalin (Lyrica)  Sertraline (Zoloft)  Sildenafil   Testosterone ==================================================================== For clinical consultation, please call [phone]. ====================================================================     Laboratory Chemistry Profile   Renal Lab Results  Component Value Date   BUN 13 09/02/2019   CREATININE 0.92 09/02/2019   BCR 8 (L) 06/03/2018   GFR 88.79 09/02/2019   GFRAA >60 10/05/2018   GFRNONAA >60 10/05/2018     Hepatic Lab Results  Component Value Date   AST 20 09/02/2019   ALT 43 09/02/2019   ALBUMIN 4.5 09/02/2019   ALKPHOS 83 09/02/2019   HCVAB (A) 09/29/2009    Reactive (NOTE) Result repeated and verified.                                                                       This test is for screening purposes only.  Reactive results should be confirmed by an alternative method.  Suggest HCV Qualitative, PCR, test code  83130.  Specimens will be stable for reflex testing up to 3 days after collection.   LIPASE 26 10/05/2018     Electrolytes Lab Results  Component Value Date   NA 139 09/02/2019   K 3.6 09/02/2019   CL 102 09/02/2019   CALCIUM 9.3 09/02/2019   MG 2.1 06/03/2018     Bone Lab Results  Component Value Date   VD25OH 21.01 (L) 02/17/2018   TESTOFREE See below 02/17/2018   TESTOSTERONE 147 (L) 02/17/2018     Inflammation (CRP: Acute Phase) (ESR: Chronic Phase) Lab Results  Component Value Date   CRP 1 06/03/2018   ESRSEDRATE 21 (H) 06/03/2018       Note: Above Lab results reviewed.  Imaging  DG PAIN CLINIC C-ARM 1-60 MIN NO REPORT Fluoro was used, but no Radiologist interpretation will be provided.  Please refer to "NOTES" tab for provider progress note.  Assessment  The primary encounter diagnosis was Chronic hip pain (Right). Diagnoses of Enthesopathy of hip region (Right), Other bursitis of hip (Right), Grade 1 Lumbosacral Retrolisthesis of L5/S1, Chronic low back pain (1ry area of Pain) (Bilateral) (R=L) w/o sciatica,  Lumbar facet syndrome (Bilateral), Chronic neck pain with history of cervical spinal surgery, Chronic pain syndrome, Pharmacologic therapy, Chronic use of opiate for therapeutic purpose, and Chronic musculoskeletal pain were also pertinent to this visit.  Plan of Care  Problem-specific:  No problem-specific Assessment & Plan notes found for this  encounter.  Chad Wilson has a current medication list which includes the following long-term medication(s): albuterol, atorvastatin, colchicine, depo-testosterone, hydrocodone-acetaminophen, levothyroxine, lisinopril, nitroglycerin, pantoprazole, pregabalin, sertraline, testosterone cypionate, tizanidine, and [DISCONTINUED] omeprazole.  Pharmacotherapy (Medications Ordered): Meds ordered this encounter  Medications  . tiZANidine (ZANAFLEX) 2 MG tablet    Sig: Take 1-2 tablets (2-4 mg total) by mouth at bedtime.    Dispense:  60 tablet    Refill:  0    Fill one day early if pharmacy is closed on scheduled refill date. Generic permitted. Do not send renewal requests. Void any older duplicate prescription or refill(s) that may be on file.  Marland Kitchen HYDROcodone-acetaminophen (NORCO/VICODIN) 5-325 MG tablet    Sig: Take 1 tablet by mouth 3 (three) times daily as needed for severe pain. Must last 30 days    Dispense:  90 tablet    Refill:  0    Not a duplicate. Do NOT delete! Dispense 1 day early if closed on refill date. Avoid benzodiazepines within 8 hours of opioids. Do not send refill requests.   Orders:  Orders Placed This Encounter  Procedures  . MR HIP RIGHT WO CONTRAST    Standing Status:   Future    Standing Expiration Date:   08/12/2020    Scheduling Instructions:     Imaging must be done as soon as possible. Inform patient that order will expire within 30 days and I will not renew it.    Order Specific Question:   What is the patient's sedation requirement?    Answer:   No Sedation    Order Specific Question:   Does the patient have a  pacemaker or implanted devices?    Answer:   No    Order Specific Question:   Preferred imaging location?    Answer:   [facility] (table limit-350lbs)    Order Specific Question:   Call Results- Best Contact Number?    Answer:   [phone] ([facility])    Order Specific Question:   Radiology Contrast Protocol - do NOT remove file path    Answer:   [email or web address]   Follow-up plan:   Return in about 1 month (around 08/12/2020) for (F2F), (MM), for tizanidine trial evaluation.      Interventional Therapies  Risk  Complexity Considerations:   WNL   Planned  Pending:      Under consideration:      Completed:   Therapeutic bilateral lumbar facet MBB x3  (09/16/2019) (100/100/50/90-100)  Therapeutic right lumbar facet RFA x2 (01/18/2020) (100/100/100/100)  Therapeutic left lumbar facet RFA x2 (02/24/2020) (100/100/50/90-100)  Diagnostic right IA hip injection x1 (06/22/2020) (75/75/75)  Diagnostic right gluteal femoral and greater trochanteric bursa injection x1 (06/22/2020) (75/25/25)    Therapeutic  Palliative (PRN) options:   Palliative bilateral lumbar facet MBB #4  Palliative right lumbar facet RFA #3  Palliative left lumbar facet RFA #3     Recent Visits Date Type Provider Dept  06/22/20 Procedure visit Milinda Pointer, MD [facility]  05/30/20 Office Visit Milinda Pointer, MD [facility]  Showing recent visits within past 90 days and meeting all other requirements Today's Visits Date Type Provider Dept  07/13/20 Telemedicine Milinda Pointer, MD [facility]  Showing today's visits and meeting all other requirements Future Appointments No visits were found meeting these conditions. Showing future appointments within next 90 days and meeting all other requirements  I discussed the assessment and treatment plan with the patient. The patient  was provided an opportunity to ask questions and  all were answered. The patient agreed with the plan and demonstrated an understanding of the instructions.  Patient advised to call back or seek an in-person evaluation if the symptoms or condition worsens.  Duration of encounter: 15 minutes.  Note by: [name], MD Date: 07/13/2020; Time: 1:22 PM

## 2020-07-14 ENCOUNTER — Other Ambulatory Visit (HOSPITAL_COMMUNITY): Payer: Self-pay

## 2020-07-20 DIAGNOSIS — E349 Endocrine disorder, unspecified: Secondary | ICD-10-CM | POA: Diagnosis not present

## 2020-07-22 ENCOUNTER — Other Ambulatory Visit: Payer: Self-pay

## 2020-07-22 ENCOUNTER — Ambulatory Visit
Admission: RE | Admit: 2020-07-22 | Discharge: 2020-07-22 | Disposition: A | Discharge status: Home / Self Care | Payer: 59 | Source: Ambulatory Visit | Attending: Pain Medicine | Admitting: Pain Medicine

## 2020-07-22 DIAGNOSIS — G8929 Other chronic pain: Secondary | ICD-10-CM | POA: Insufficient documentation

## 2020-07-22 DIAGNOSIS — M25551 Pain in right hip: Secondary | ICD-10-CM | POA: Insufficient documentation

## 2020-07-22 DIAGNOSIS — M7071 Other bursitis of hip, right hip: Secondary | ICD-10-CM | POA: Insufficient documentation

## 2020-07-22 DIAGNOSIS — M76891 Other specified enthesopathies of right lower limb, excluding foot: Secondary | ICD-10-CM | POA: Diagnosis not present

## 2020-07-22 DIAGNOSIS — S73191A Other sprain of right hip, initial encounter: Secondary | ICD-10-CM | POA: Diagnosis not present

## 2020-07-22 DIAGNOSIS — M24151 Other articular cartilage disorders, right hip: Secondary | ICD-10-CM | POA: Diagnosis not present

## 2020-07-23 NOTE — Progress Notes (Deleted)
No show to appointment.

## 2020-07-24 ENCOUNTER — Encounter: Payer: 59 | Admitting: Pain Medicine

## 2020-07-24 DIAGNOSIS — Z79891 Long term (current) use of opiate analgesic: Secondary | ICD-10-CM | POA: Insufficient documentation

## 2020-07-27 ENCOUNTER — Ambulatory Visit: Payer: 59 | Admitting: Pain Medicine

## 2020-08-08 ENCOUNTER — Other Ambulatory Visit: Payer: Self-pay | Admitting: Pain Medicine

## 2020-08-08 ENCOUNTER — Other Ambulatory Visit (HOSPITAL_COMMUNITY): Payer: Self-pay

## 2020-08-08 DIAGNOSIS — M792 Neuralgia and neuritis, unspecified: Secondary | ICD-10-CM

## 2020-08-08 MED FILL — Pantoprazole Sodium EC Tab 40 MG (Base Equiv): ORAL | 30 days supply | Qty: 30 | Fill #1 | Status: AC

## 2020-08-09 ENCOUNTER — Other Ambulatory Visit: Payer: Self-pay | Admitting: Pain Medicine

## 2020-08-09 ENCOUNTER — Other Ambulatory Visit (HOSPITAL_COMMUNITY): Payer: Self-pay

## 2020-08-09 ENCOUNTER — Other Ambulatory Visit: Payer: Self-pay | Admitting: Adult Health

## 2020-08-09 DIAGNOSIS — M792 Neuralgia and neuritis, unspecified: Secondary | ICD-10-CM

## 2020-08-10 ENCOUNTER — Other Ambulatory Visit (HOSPITAL_COMMUNITY): Payer: Self-pay

## 2020-08-10 MED ORDER — ALPRAZOLAM 0.5 MG PO TABS
0.5000 mg | ORAL_TABLET | Freq: Two times a day (BID) | ORAL | 2 refills | Status: DC
  Filled 2020-08-10: qty 60, 30d supply, fill #0
  Filled 2020-09-14: qty 60, 30d supply, fill #1
  Filled 2020-10-30: qty 60, 30d supply, fill #2

## 2020-08-10 NOTE — Telephone Encounter (Signed)
Okay for refill?    LOV 09/02/2019   Last Refill     60  QTY.  2 Refills

## 2020-08-11 ENCOUNTER — Other Ambulatory Visit: Payer: Self-pay | Admitting: Pain Medicine

## 2020-08-11 ENCOUNTER — Other Ambulatory Visit (HOSPITAL_COMMUNITY): Payer: Self-pay

## 2020-08-11 DIAGNOSIS — M792 Neuralgia and neuritis, unspecified: Secondary | ICD-10-CM

## 2020-08-17 ENCOUNTER — Other Ambulatory Visit (HOSPITAL_COMMUNITY): Payer: Self-pay

## 2020-08-20 NOTE — Progress Notes (Signed)
PROVIDER NOTE: Information contained herein reflects review and annotations entered in association with encounter. Interpretation of such information and data should be left to medically-trained personnel. Information provided to patient can be located elsewhere in the medical record under "Patient Instructions". Document created using STT-dictation technology, any transcriptional errors that may result from process are unintentional.    Patient: Chad Wilson  Service Category: E/M  Provider: [name], MD  DOB: 08/30/1975  DOS: 08/21/2020  Specialty: Interventional Pain Management  MRN: [id number]  Setting: Ambulatory outpatient  PCP: Dorothyann Peng, NP  Type: Established Patient    Referring Provider: Dorothyann Peng, NP  Location: Office  Delivery: Face-to-face     HPI  [name], a 45 y.o. year old male, is here today because of his Chronic pain syndrome [G89.4]. Chad Wilson primary complain today is Back Pain Last encounter: My last encounter with him was on 08/11/2020. Pertinent problems: Chad Wilson has Chronic neck pain with history of cervical spinal surgery; Cervical radiculopathy at C6; Status post lumbar spine surgery for decompression of spinal cord; History of cervical spinal surgery; DDD (degenerative disc disease), lumbosacral; Lumbar facet syndrome (Bilateral); Chronic pain syndrome; Chronic low back pain (1ry area of Pain) (Bilateral) (R=L) w/o sciatica; Spondylosis without myelopathy or radiculopathy, lumbosacral region; Grade 1 Lumbosacral Retrolisthesis of L5/S1; Lumbar facet hypertrophy (Bilateral); Osteoarthritis of facet joint of lumbar spine; Osteoarthritis of lumbar spine; Lumbar spondylosis; DDD (degenerative disc disease), cervical; Cervicalgia; Abnormal MRI, lumbar spine; Neurogenic pain; Chronic hip pain (Right); Enthesopathy of hip region (Right); Other bursitis of hip (Right); Greater trochanteric bursitis (Right); Chronic musculoskeletal pain; Abnormal MRI, hip (Right)  (07/22/2020); and 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 Pain Assessment: Severity of Chronic pain is reported as a 7 /10. Location: Back Lower/Denies. Onset: More than a month ago. Quality: Aching,Burning,Tightness,Constant. Timing: Constant. Modifying factor(s): sitting down, meds. Vitals:  height is 5' 6"  (1.676 m) and weight is 220 lb (99.8 kg). His temperature is 98.2 F (36.8 C). His blood pressure is 168/93 (abnormal) and his pulse is 78. His oxygen saturation is 98%.   Reason for encounter: medication management.   The patient indicates doing well with the current medication regimen. No adverse reactions or side effects reported to the medications.   06/08/2020 right hip x-rays: Negative 07/22/2020 right hip MRI: IMPRESSION: 1. Mild partial-thickness cartilage loss of the right femoral head and acetabulum. 2. Right superior labral degeneration with a tear of the superior anterior labrum. Likely degeneration of the left superior labrum.  RTCB: 11/19/2020 Nonopioids transferred 01/18/2020: Lyrica  Pharmacotherapy Assessment   Analgesic: Hydrocodone/APAP 5/325, 1 tab p.o. every 8 hours (15 mg/day of hydrocodone) MME/day: 15 mg/day.   Monitoring: [place] PMP: PDMP reviewed during this encounter.       Pharmacotherapy: No side-effects or adverse reactions reported. Compliance: No problems identified. Effectiveness: Clinically acceptable.  Chad Fischer, RN  08/21/2020  3:25 PM  Sign when Signing Visit   Nursing Pain Medication Assessment:  Safety precautions to be maintained throughout the outpatient stay will include: orient to surroundings, keep bed in low position, maintain call bell within reach at all times, provide assistance with transfer out of bed and ambulation.  Medication Inspection Compliance: Pill count conducted under aseptic conditions, in front of the patient. Neither the pills nor the bottle was removed from the patient's sight  at any time. Once count was completed pills were immediately returned to the patient in their original  bottle.  Medication: Hydrocodone/APAP Pill/Patch Count: 0 of 90 pills remain Pill/Patch Appearance: Markings consistent with prescribed medication Bottle Appearance: Standard pharmacy container. Clearly labeled. Filled Date: 4 / 30 / 2022 Last Medication intake:  Yesterday   Pt was taking 1/2 a pill until yesterday, he had to reschulde is medication refill appointment due to work.    UDS:  Summary  Date Value Ref Range Status  08/23/2019 Note  Final    Comment:    ==================================================================== [employer] Select 13 (MW) ==================================================================== Test                             Result       Flag       Units  Drug Present and Declared for Prescription Verification   7-aminoclonazepam              36           EXPECTED   ng/mg creat    7-aminoclonazepam is an expected metabolite of clonazepam. Source of    clonazepam is a scheduled prescription medication.    Oxycodone                      427          EXPECTED   ng/mg creat   Oxymorphone                    665          EXPECTED   ng/mg creat   Noroxycodone                   1768         EXPECTED   ng/mg creat   Noroxymorphone                 429          EXPECTED   ng/mg creat    Sources of oxycodone are scheduled prescription medications.    Oxymorphone, noroxycodone, and noroxymorphone are expected    metabolites of oxycodone. Oxymorphone is also available as a    scheduled prescription medication.  Drug Present not Declared for Prescription Verification   Alprazolam                     29           UNEXPECTED ng/mg creat   Alpha-hydroxyalprazolam        49           UNEXPECTED ng/mg creat    Source of alprazolam is a scheduled prescription medication. Alpha-    hydroxyalprazolam is an expected metabolite of  alprazolam.  ==================================================================== Test                      Result    Flag   Units      Ref Range   Creatinine              146              mg/dL      >=20 ==================================================================== Declared Medications:  The flagging and interpretation on this report are based on the  following declared medications.  Unexpected results may arise from  inaccuracies in the declared medications.   **Note: The testing scope of this panel includes these medications:   Clonazepam (Klonopin)  Oxycodone (Roxicodone)   **Note: The testing scope  of this panel does not include the  following reported medications:   Albuterol (Ventolin HFA)  Atorvastatin (Lipitor)  Buspirone (Buspar)  Colchicine  Levothyroxine (Synthroid)  Lisinopril (Zestril)  Omeprazole (Prilosec)  Pantoprazole (Protonix)  Pregabalin (Lyrica)  Sertraline (Zoloft)  Sildenafil  Testosterone ==================================================================== For clinical consultation, please call [phone]. ====================================================================      ROS  Constitutional: Denies any fever or chills Gastrointestinal: No reported hemesis, hematochezia, vomiting, or acute GI distress Musculoskeletal: Denies any acute onset joint swelling, redness, loss of ROM, or weakness Neurological: No reported episodes of acute onset apraxia, aphasia, dysarthria, agnosia, amnesia, paralysis, loss of coordination, or loss of consciousness  Medication Review  ALPRAZolam, Colchicine, HYDROcodone-acetaminophen, albuterol, atorvastatin, busPIRone, levothyroxine, lisinopril, nabumetone, nitroGLYCERIN, omeprazole, pantoprazole, pregabalin, sertraline, sildenafil, testosterone cypionate, and tiZANidine  History Review  Allergy: Mr. Livecchi is allergic to chantix [varenicline]. Drug: Mr. Lancour  reports previous drug use. Alcohol:   reports current alcohol use of about 42.0 standard drinks of alcohol per week. Tobacco:  reports that he has been smoking cigarettes. He has a 30.00 pack-year smoking history. His smokeless tobacco use includes chew. Social: Mr. Fantroy  reports that he has been smoking cigarettes. He has a 30.00 pack-year smoking history. His smokeless tobacco use includes chew. He reports current alcohol use of about 42.0 standard drinks of alcohol per week. He reports previous drug use. Medical:  has a past medical history of Acute hepatitis C without mention of hepatic coma(070.51), Acute postoperative pain (12/10/2018), Alcohol abuse, unspecified, Anxiety and depression, Anxiety states, Essential hypertension, benign, GERD (gastroesophageal reflux disease), Graves disease, Panic disorder without agoraphobia, Thyroid disease, and Tobacco use disorder. Surgical: Mr. Viegas  has a past surgical history that includes Multiple tooth extractions (15 yrs. ago); Anterior cervical decomp/discectomy fusion (N/A, 05/21/2017); and Wisdom tooth extraction. Family: family history includes Anxiety disorder in his father and sister; Heart disease in his father; Hypertension in his father; Hyperthyroidism in his sister; Thyroid disease in his mother.  Laboratory Chemistry Profile   Renal Lab Results  Component Value Date   BUN 13 09/02/2019   CREATININE 0.92 09/02/2019   BCR 8 (L) 06/03/2018   GFR 88.79 09/02/2019   GFRAA >60 10/05/2018   GFRNONAA >60 10/05/2018     Hepatic Lab Results  Component Value Date   AST 20 09/02/2019   ALT 43 09/02/2019   ALBUMIN 4.5 09/02/2019   ALKPHOS 83 09/02/2019   HCVAB (A) 09/29/2009    Reactive (NOTE) Result repeated and verified.                                                                       This test is for screening purposes only.  Reactive results should be confirmed by an alternative method.  Suggest HCV Qualitative, PCR, test code  83130.  Specimens will be stable for reflex  testing up to 3 days after collection.   LIPASE 26 10/05/2018     Electrolytes Lab Results  Component Value Date   NA 139 09/02/2019   K 3.6 09/02/2019   CL 102 09/02/2019   CALCIUM 9.3 09/02/2019   MG 2.1 06/03/2018     Bone Lab Results  Component Value Date   VD25OH 21.01 (L) 02/17/2018   TESTOFREE See  below 02/17/2018   TESTOSTERONE 147 (L) 02/17/2018     Inflammation (CRP: Acute Phase) (ESR: Chronic Phase) Lab Results  Component Value Date   CRP 1 06/03/2018   ESRSEDRATE 21 (H) 06/03/2018       Note: Above Lab results reviewed.  Recent Imaging Review  MR HIP RIGHT WO CONTRAST CLINICAL DATA:  Right hip pain.  EXAM: MR OF THE RIGHT HIP WITHOUT CONTRAST  TECHNIQUE: Multiplanar, multisequence MR imaging was performed. No intravenous contrast was administered.  COMPARISON:  None.  FINDINGS: Bones:  No hip fracture, dislocation or avascular necrosis. No periosteal reaction or bone destruction. No aggressive osseous lesion. Osseous prominence of the superior anterior femoral head-neck junction of bilateral femoral heads.  Normal sacrum and sacroiliac joints. No SI joint widening or erosive changes.  Articular cartilage and labrum  Articular cartilage: Mild partial-thickness cartilage loss of the right femoral head and acetabulum.  Labrum: Right superior labral degeneration with a tear of the superior anterior labrum. Likely degeneration of the left superior labrum.  Joint or bursal effusion  Joint effusion:  No hip joint effusion.  No SI joint effusion.  Bursae:  No bursa formation.  Muscles and tendons  Flexors: Normal.  Extensors: Normal.  Abductors: Normal.  Adductors: Normal.  Gluteals: Normal.  Hamstrings: Normal.  Other findings  No pelvic free fluid. No fluid collection or hematoma. No inguinal lymphadenopathy. No inguinal hernia.  IMPRESSION: 1. Mild partial-thickness cartilage loss of the right femoral head and  acetabulum. 2. Right superior labral degeneration with a tear of the superior anterior labrum. Likely degeneration of the left superior labrum.  Electronically Signed   By: Kathreen Devoid   On: 07/23/2020 11:24 Note: Reviewed        Physical Exam  General appearance: Well nourished, well developed, and well hydrated. In no apparent acute distress Mental status: Alert, oriented x 3 (person, place, & time)       Respiratory: No evidence of acute respiratory distress Eyes: PERLA Vitals: BP (!) 168/93   Pulse 78   Temp 98.2 F (36.8 C)   Ht 5' 6"  (1.676 m)   Wt 220 lb (99.8 kg)   SpO2 98%   BMI 35.51 kg/m  BMI: Estimated body mass index is 35.51 kg/m as calculated from the following:   Height as of this encounter: 5' 6"  (1.676 m).   Weight as of this encounter: 220 lb (99.8 kg). Ideal: Ideal body weight: 63.8 kg (140 lb 10.5 oz) Adjusted ideal body weight: 78.2 kg (172 lb 6.3 oz)  Assessment   Status Diagnosis  Controlled Controlled Controlled 1. Chronic pain syndrome   2. Chronic low back pain (1ry area of Pain) (Bilateral) (R=L) w/o sciatica   3. Chronic neck pain with history of cervical spinal surgery   4. Lumbar facet syndrome (Bilateral)   5. Pharmacologic therapy   6. Chronic use of opiate for therapeutic purpose   7. Abnormal MRI, hip (Right) (07/22/2020)   8. 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 Chronic hip pain (Right)   10. Enthesopathy of hip region (Right)      Updated Problems: Problem  Abnormal MRI, hip (Right) (07/22/2020)   07/22/2020 right hip MRI: IMPRESSION: 1. Mild partial-thickness cartilage loss of the right femoral head and acetabulum. 2. Right superior labral degeneration with a tear of the superior anterior labrum. Likely degeneration of the left superior labrum.   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 Mild partial-thickness cartilage loss of the right femoral head  and acetabulum. 2. Right superior labral degeneration with a tear of the superior anterior labrum. Likely degeneration of the left superior labrum.     Plan of Care  Problem-specific:  No problem-specific Assessment & Plan notes found for this encounter.  Mr. Chad E Chiquito has a current medication list which includes the following long-term medication(s): albuterol, atorvastatin, colchicine, depo-testosterone, levothyroxine, lisinopril, nitroglycerin, pantoprazole, sertraline, tizanidine, hydrocodone-acetaminophen, [START ON 09/20/2020] hydrocodone-acetaminophen, [START ON 10/20/2020] hydrocodone-acetaminophen, pregabalin, testosterone cypionate, and [DISCONTINUED] omeprazole.  Pharmacotherapy (Medications Ordered): Meds ordered this encounter  Medications  . HYDROcodone-acetaminophen (NORCO/VICODIN) 5-325 MG tablet    Sig: Take 1 tablet by mouth 3 (three) times daily as needed for severe pain. Must last 30 days    Dispense:  90 tablet    Refill:  0    Not a duplicate. Do NOT delete! Dispense 1 day early if closed on refill date. Avoid benzodiazepines within 8 hours of opioids. Do not send refill requests.  Marland Kitchen HYDROcodone-acetaminophen (NORCO/VICODIN) 5-325 MG tablet    Sig: Take 1 tablet by mouth 3 (three) times daily as needed for severe pain. Must last 30 days    Dispense:  90 tablet    Refill:  0    Not a duplicate. Do NOT delete! Dispense 1 day early if closed on refill date. Avoid benzodiazepines within 8 hours of opioids. Do not send refill requests.  Marland Kitchen HYDROcodone-acetaminophen (NORCO/VICODIN) 5-325 MG tablet    Sig: Take 1 tablet by mouth 3 (three) times daily as needed for severe pain. Must last 30 days    Dispense:  90 tablet    Refill:  0    Not a duplicate. Do NOT delete! Dispense 1 day early if closed on refill date. Avoid benzodiazepines within 8 hours of opioids. Do not send refill requests.   Orders:  Orders Placed This Encounter  Procedures  . [employer] Select 13 (MW),  Urine    Volume: 30 ml(s). Minimum 3 ml of urine is needed. Document temperature of fresh sample. Indications: Long term (current) use of opiate analgesic (A54.098)    Order Specific Question:   Release to patient    Answer:   Immediate  . Ambulatory referral to Orthopedic Surgery    Referral Priority:   Routine    Referral Type:   Surgical    Referral Reason:   Specialty Services Required    Requested Specialty:   Orthopedic Surgery    Number of Visits Requested:   1   Follow-up plan:   Return in about 3 months (around 11/19/2020) for evaluation day (F2F) (MM).      Interventional Therapies  Risk  Complexity Considerations:   WNL   Planned  Pending:   (08/21/2020) referral to orthopedics to evaluate right hip superior labral tear.   Under consideration:      Completed:   Therapeutic bilateral lumbar facet MBB x3  (09/16/2019) (100/100/50/90-100)  Therapeutic right lumbar facet RFA x2 (01/18/2020) (100/100/100/100)  Therapeutic left lumbar facet RFA x2 (02/24/2020) (100/100/50/90-100)  Diagnostic right IA hip injection x1 (06/22/2020) (75/75/75)  Diagnostic right gluteal femoral and greater trochanteric bursa injection x1 (06/22/2020) (75/25/25)    Therapeutic  Palliative (PRN) options:   Palliative bilateral lumbar facet MBB #4  Palliative right lumbar facet RFA #3  Palliative left lumbar facet RFA #3     Recent Visits Date Type Provider Dept  07/13/20 Telemedicine Milinda Pointer, [facility]  06/22/20  Procedure visit Milinda Pointer, MD [facility]  05/30/20 Office Visit Milinda Pointer, MD [facility]  Showing recent visits within past 90 days and meeting all other requirements Today's Visits Date Type Provider Dept  08/21/20 Office Visit Milinda Pointer, MD [facility]  Showing today's visits and meeting all other requirements Future Appointments No visits were found meeting these conditions. Showing future  appointments within next 90 days and meeting all other requirements  I discussed the assessment and treatment plan with the patient. The patient was provided an opportunity to ask questions and all were answered. The patient agreed with the plan and demonstrated an understanding of the instructions.  Patient advised to call back or seek an in-person evaluation if the symptoms or condition worsens.  Duration of encounter: 30 minutes.  Note by: [name], MD Date: 08/21/2020; Time: 3:52 PM

## 2020-08-21 ENCOUNTER — Encounter: Payer: Self-pay | Admitting: Pain Medicine

## 2020-08-21 ENCOUNTER — Other Ambulatory Visit (HOSPITAL_COMMUNITY): Payer: Self-pay

## 2020-08-21 ENCOUNTER — Ambulatory Visit: Payer: 59 | Attending: Pain Medicine | Admitting: Pain Medicine

## 2020-08-21 ENCOUNTER — Other Ambulatory Visit: Payer: Self-pay

## 2020-08-21 VITALS — BP 168/93 | HR 78 | Temp 98.2°F | Ht 66.0 in | Wt 220.0 lb

## 2020-08-21 DIAGNOSIS — M542 Cervicalgia: Secondary | ICD-10-CM | POA: Diagnosis not present

## 2020-08-21 DIAGNOSIS — Z79891 Long term (current) use of opiate analgesic: Secondary | ICD-10-CM | POA: Diagnosis not present

## 2020-08-21 DIAGNOSIS — M47816 Spondylosis without myelopathy or radiculopathy, lumbar region: Secondary | ICD-10-CM | POA: Insufficient documentation

## 2020-08-21 DIAGNOSIS — G894 Chronic pain syndrome: Secondary | ICD-10-CM | POA: Diagnosis not present

## 2020-08-21 DIAGNOSIS — G8929 Other chronic pain: Secondary | ICD-10-CM | POA: Insufficient documentation

## 2020-08-21 DIAGNOSIS — M545 Low back pain, unspecified: Secondary | ICD-10-CM | POA: Insufficient documentation

## 2020-08-21 DIAGNOSIS — G8928 Other chronic postprocedural pain: Secondary | ICD-10-CM | POA: Diagnosis present

## 2020-08-21 DIAGNOSIS — M25551 Pain in right hip: Secondary | ICD-10-CM | POA: Diagnosis not present

## 2020-08-21 DIAGNOSIS — Z79899 Other long term (current) drug therapy: Secondary | ICD-10-CM | POA: Diagnosis not present

## 2020-08-21 DIAGNOSIS — M76891 Other specified enthesopathies of right lower limb, excluding foot: Secondary | ICD-10-CM | POA: Diagnosis not present

## 2020-08-21 DIAGNOSIS — R935 Abnormal findings on diagnostic imaging of other abdominal regions, including retroperitoneum: Secondary | ICD-10-CM | POA: Insufficient documentation

## 2020-08-21 DIAGNOSIS — M24151 Other articular cartilage disorders, right hip: Secondary | ICD-10-CM | POA: Diagnosis not present

## 2020-08-21 DIAGNOSIS — Z9889 Other specified postprocedural states: Secondary | ICD-10-CM | POA: Insufficient documentation

## 2020-08-21 MED ORDER — HYDROCODONE-ACETAMINOPHEN 5-325 MG PO TABS
1.0000 | ORAL_TABLET | Freq: Three times a day (TID) | ORAL | 0 refills | Status: DC | PRN
  Filled 2020-09-20: qty 90, 30d supply, fill #0

## 2020-08-21 MED ORDER — HYDROCODONE-ACETAMINOPHEN 5-325 MG PO TABS
1.0000 | ORAL_TABLET | Freq: Three times a day (TID) | ORAL | 0 refills | Status: DC | PRN
  Filled 2020-10-23 (×2): qty 90, 30d supply, fill #0

## 2020-08-21 MED ORDER — HYDROCODONE-ACETAMINOPHEN 5-325 MG PO TABS
1.0000 | ORAL_TABLET | Freq: Three times a day (TID) | ORAL | 0 refills | Status: DC | PRN
  Filled 2020-08-21: qty 90, 30d supply, fill #0

## 2020-08-21 NOTE — Patient Instructions (Signed)
____________________________________________________________________________________________  Medication Rules  Purpose: To inform patients, and their family members, of our rules and regulations.  Applies to: All patients receiving prescriptions (written or electronic).  Pharmacy of record: Pharmacy where electronic prescriptions will be sent. If written prescriptions are taken to a different pharmacy, please inform the nursing staff. The pharmacy listed in the electronic medical record should be the one where you would like electronic prescriptions to be sent.  Electronic prescriptions: In compliance with the [place] (STOP) Act of 2017 (Session Lanny Cramp [phone]), effective March 18, 2018, all controlled substances must be electronically prescribed. Calling prescriptions to the pharmacy will cease to exist.  Prescription refills: Only during scheduled appointments. Applies to all prescriptions.  NOTE: The following applies primarily to controlled substances (Opioid* Pain Medications).   Type of encounter (visit): For patients receiving controlled substances, face-to-face visits are required. (Not an option or up to the patient.)  Patient's responsibilities: 1. Pain Pills: Bring all pain pills to every appointment (except for procedure appointments). 2. Pill Bottles: Bring pills in original pharmacy bottle. Always bring the newest bottle. Bring bottle, even if empty. 3. Medication refills: You are responsible for knowing and keeping track of what medications you take and those you need refilled. The day before your appointment: write a list of all prescriptions that need to be refilled. The day of the appointment: give the list to the admitting nurse. Prescriptions will be written only during appointments. No prescriptions will be written on procedure days. If you forget a medication: it will not be "Called in", "Faxed", or "electronically sent".  You will need to get another appointment to get these prescribed. No early refills. Do not call asking to have your prescription filled early. 4. Prescription Accuracy: You are responsible for carefully inspecting your prescriptions before leaving our office. Have the discharge nurse carefully go over each prescription with you, before taking them home. Make sure that your name is accurately spelled, that your address is correct. Check the name and dose of your medication to make sure it is accurate. Check the number of pills, and the written instructions to make sure they are clear and accurate. Make sure that you are given enough medication to last until your next medication refill appointment. 5. Taking Medication: Take medication as prescribed. When it comes to controlled substances, taking less pills or less frequently than prescribed is permitted and encouraged. Never take more pills than instructed. Never take medication more frequently than prescribed.  6. Inform other Doctors: Always inform, all of your healthcare providers, of all the medications you take. 7. Pain Medication from other Providers: You are not allowed to accept any additional pain medication from any other Doctor or Healthcare provider. There are two exceptions to this rule. (see below) In the event that you require additional pain medication, you are responsible for notifying us, as stated below. 8. Cough Medicine: Often these contain an opioid, such as codeine or hydrocodone. Never accept or take cough medicine containing these opioids if you are already taking an opioid* medication. The combination may cause respiratory failure and death. 9. Medication Agreement: You are responsible for carefully reading and following our Medication Agreement. This must be signed before receiving any prescriptions from our practice. Safely store a copy of your signed Agreement. Violations to the Agreement will result in no further prescriptions.  (Additional copies of our Medication Agreement are available upon request.) 10. Laws, Rules, & Regulations: All patients are expected to follow all  Federal and [employer], [employer], [employer], [employer]. Ignorance of the Laws does not constitute a valid excuse.  11. Illegal drugs and Controlled Substances: The use of illegal substances (including, but not limited to marijuana and its derivatives) and/or the illegal use of any controlled substances is strictly prohibited. Violation of this rule may result in the immediate and permanent discontinuation of any and all prescriptions being written by our practice. The use of any illegal substances is prohibited. 12. Adopted CDC guidelines & recommendations: Target dosing levels will be at or below 60 MME/day. Use of benzodiazepines** is not recommended.  Exceptions: There are only two exceptions to the rule of not receiving pain medications from other Healthcare Providers. 1. Exception #1 (Emergencies): In the event of an emergency (i.e.: accident requiring emergency care), you are allowed to receive additional pain medication. However, you are responsible for: As soon as you are able, call our office [phone], at any time of the day or night, and leave a message stating your name, the date and nature of the emergency, and the name and dose of the medication prescribed. In the event that your call is answered by a member of our staff, make sure to document and save the date, time, and the name of the person that took your information.  2. Exception #2 (Planned Surgery): In the event that you are scheduled by another doctor or dentist to have any type of surgery or procedure, you are allowed (for a period no longer than 30 days), to receive additional pain medication, for the acute post-op pain. However, in this case, you are responsible for picking up a copy of our "Post-op Pain Management for Surgeons" handout, and giving it to your surgeon or dentist. This  document is available at our office, and does not require an appointment to obtain it. Simply go to our office during business hours (Monday-Thursday from 8:00 AM to 4:00 PM) (Friday 8:00 AM to 12:00 Noon) or if you have a scheduled appointment with [place], prior to your surgery, and ask for it by name. In addition, you are responsible for: calling our office [phone], at any time of the day or night, and leaving a message stating your name, name of your surgeon, type of surgery, and date of procedure or surgery. Failure to comply with your responsibilities may result in termination of therapy involving the controlled substances.  *Opioid medications include: morphine, codeine, oxycodone, oxymorphone, hydrocodone, hydromorphone, meperidine, tramadol, tapentadol, buprenorphine, fentanyl, methadone. **Benzodiazepine medications include: diazepam (Valium), alprazolam (Xanax), clonazepam (Klonopine), lorazepam (Ativan), clorazepate (Tranxene), chlordiazepoxide (Librium), estazolam (Prosom), oxazepam (Serax), temazepam (Restoril), triazolam (Halcion) (Last updated: 02/14/2020) ____________________________________________________________________________________________   ____________________________________________________________________________________________  Medication Recommendations and Reminders  Applies to: All patients receiving prescriptions (written and/or electronic).  Medication Rules & Regulations: These rules and regulations exist for your safety and that of others. They are not flexible and neither are we. Dismissing or ignoring them will be considered "non-compliance" with medication therapy, resulting in complete and irreversible termination of such therapy. (See document titled "Medication Rules" for more details.) In all conscience, because of safety reasons, we cannot continue providing a therapy where the patient does not follow instructions.  Pharmacy of record:   Definition:  This is the pharmacy where your electronic prescriptions will be sent.   We do not endorse any particular pharmacy, however, we have experienced problems with [facility] not securing enough medication supply for the community.  We do not restrict you in your choice of pharmacy. However,  once we write for your prescriptions, we will NOT be re-sending more prescriptions to fix restricted supply problems created by your pharmacy, or your insurance.   The pharmacy listed in the electronic medical record should be the one where you want electronic prescriptions to be sent.  If you choose to change pharmacy, simply notify our nursing staff.  Recommendations:  Keep all of your pain medications in a safe place, under lock and key, even if you live alone. We will NOT replace lost, stolen, or damaged medication.  After you fill your prescription, take 1 week's worth of pills and put them away in a safe place. You should keep a separate, properly labeled bottle for this purpose. The remainder should be kept in the original bottle. Use this as your primary supply, until it runs out. Once it's gone, then you know that you have 1 week's worth of medicine, and it is time to come in for a prescription refill. If you do this correctly, it is unlikely that you will ever run out of medicine.  To make sure that the above recommendation works, it is very important that you make sure your medication refill appointments are scheduled at least 1 week before you run out of medicine. To do this in an effective manner, make sure that you do not leave the office without scheduling your next medication management appointment. Always ask the nursing staff to show you in your prescription , when your medication will be running out. Then arrange for the receptionist to get you a return appointment, at least 7 days before you run out of medicine. Do not wait until you have 1 or 2 pills left, to come in. This is very poor planning and  does not take into consideration that we may need to cancel appointments due to bad weather, sickness, or emergencies affecting our staff.  DO NOT ACCEPT A "Partial Fill": If for any reason your pharmacy does not have enough pills/tablets to completely fill or refill your prescription, do not allow for a "partial fill". The law allows the pharmacy to complete that prescription within 72 hours, without requiring a new prescription. If they do not fill the rest of your prescription within those 72 hours, you will need a separate prescription to fill the remaining amount, which we will NOT provide. If the reason for the partial fill is your insurance, you will need to talk to the pharmacist about payment alternatives for the remaining tablets, but again, DO NOT ACCEPT A PARTIAL FILL, unless you can trust your pharmacist to obtain the remainder of the pills within 72 hours.  Prescription refills and/or changes in medication(s):   Prescription refills, and/or changes in dose or medication, will be conducted only during scheduled medication management appointments. (Applies to both, written and electronic prescriptions.)  No refills on procedure days. No medication will be changed or started on procedure days. No changes, adjustments, and/or refills will be conducted on a procedure day. Doing so will interfere with the diagnostic portion of the procedure.  No phone refills. No medications will be "called into the pharmacy".  No Fax refills.  No weekend refills.  No Holliday refills.  No after hours refills.  Remember:  Business hours are:  Monday to Thursday 8:00 AM to 4:00 PM Provider's Schedule: Milinda Pointer, MD - Appointments are:  Medication management: Monday and Wednesday 8:00 AM to 4:00 PM Procedure day: Tuesday and Thursday 7:30 AM to 4:00 PM Gillis Santa, MD - Appointments are:  Medication management: Tuesday and Thursday 8:00 AM to 4:00 PM Procedure day: Monday and Wednesday  7:30 AM to 4:00 PM (Last update: 10/06/2019) ____________________________________________________________________________________________   ____________________________________________________________________________________________  CBD (cannabidiol) WARNING  Applicable to: All individuals currently taking or considering taking CBD (cannabidiol) and, more important, all patients taking opioid analgesic controlled substances (pain medication). (Example: oxycodone; oxymorphone; hydrocodone; hydromorphone; morphine; methadone; tramadol; tapentadol; fentanyl; buprenorphine; butorphanol; dextromethorphan; meperidine; codeine; etc.)  Legal status: CBD remains a Schedule I drug prohibited for any use. CBD is illegal with one exception. In the [place], CBD has a limited Transport planner (FDA) approval for the treatment of two specific types of epilepsy disorders. Only one CBD product has been approved by the FDA for this purpose: "Epidiolex". FDA is aware that some companies are marketing products containing cannabis and cannabis-derived compounds in ways that violate the [employer], Drug and Cosmetic Act [facility] Act) and that may put the health and safety of consumers at risk. The FDA, a Federal agency, has not enforced the CBD status since 2018.   Legality: Some manufacturers ship CBD products nationally, which is illegal. Often such products are sold online and are therefore available throughout the country. CBD is openly sold in head shops and health food stores in some states where such sales have not been explicitly legalized. Selling unapproved products with unsubstantiated therapeutic claims is not only a violation of the law, but also can put patients at risk, as these products have not been proven to be safe or effective. Federal illegality makes it difficult to conduct research on CBD.  Reference: "FDA Regulation of Cannabis and Cannabis-Derived Products, Including Cannabidiol  (CBD)" - [employer]  Warning: CBD is not FDA approved and has not undergo the same manufacturing controls as prescription drugs.  This means that the purity and safety of available CBD may be questionable. Most of the time, despite manufacturer's claims, it is contaminated with THC (delta-9-tetrahydrocannabinol - the chemical in marijuana responsible for the "HIGH").  When this is the case, the [facility] contaminant will trigger a positive urine drug screen (UDS) test for Marijuana (carboxy-THC). Because a positive UDS for any illicit substance is a violation of our medication agreement, your opioid analgesics (pain medicine) may be permanently discontinued.  MORE ABOUT CBD  General Information: CBD  is a derivative of the Marijuana (cannabis sativa) plant discovered in 30. It is one of the 113 identified substances found in Marijuana. It accounts for up to 40% of the plant's extract. As of 2018, preliminary clinical studies on CBD included research for the treatment of anxiety, movement disorders, and pain. CBD is available and consumed in multiple forms, including inhalation of smoke or vapor, as an aerosol spray, and by mouth. It may be supplied as an oil containing CBD, capsules, dried cannabis, or as a liquid solution. CBD is thought not to be as psychoactive as THC (delta-9-tetrahydrocannabinol - the chemical in marijuana responsible for the "HIGH"). Studies suggest that CBD may interact with different biological target receptors in the body, including cannabinoid and other neurotransmitter receptors. As of 2018 the mechanism of action for its biological effects has not been determined.  Side-effects  Adverse reactions: Dry mouth, diarrhea, decreased appetite, fatigue, drowsiness, malaise, weakness, sleep disturbances, and others.  Drug interactions: CBC may interact with other  medications such as blood-thinners. (Last update: 10/23/2019) ____________________________________________________________________________________________   ____________________________________________________________________________________________  Drug Holidays (Slow)  What is a "Drug Holiday"? Drug Holiday: is the name given to the period of time during  which a patient stops taking a medication(s) for the purpose of eliminating tolerance to the drug.  Benefits . Improved effectiveness of opioids. . Decreased opioid dose needed to achieve benefits. . Improved pain with lesser dose.  What is tolerance? Tolerance: is the progressive decreased in effectiveness of a drug due to its repetitive use. With repetitive use, the body gets use to the medication and as a consequence, it loses its effectiveness. This is a common problem seen with opioid pain medications. As a result, a larger dose of the drug is needed to achieve the same effect that used to be obtained with a smaller dose.  How long should a "Drug Holiday" last? You should stay off of the pain medicine for at least 14 consecutive days. (2 weeks)  Should I stop the medicine "cold [place]"? No. You should always coordinate with your Pain Specialist so that he/she can provide you with the correct medication dose to make the transition as smoothly as possible.  How do I stop the medicine? Slowly. You will be instructed to decrease the daily amount of pills that you take by one (1) pill every seven (7) days. This is called a "slow downward taper" of your dose. For example: if you normally take four (4) pills per day, you will be asked to drop this dose to three (3) pills per day for seven (7) days, then to two (2) pills per day for seven (7) days, then to one (1) per day for seven (7) days, and at the end of those last seven (7) days, this is when the "Drug Holiday" would start.   Will I have withdrawals? By doing a "slow downward  taper" like this one, it is unlikely that you will experience any significant withdrawal symptoms. Typically, what triggers withdrawals is the sudden stop of a high dose opioid therapy. Withdrawals can usually be avoided by slowly decreasing the dose over a prolonged period of time. If you do not follow these instructions and decide to stop your medication abruptly, withdrawals may be possible.  What are withdrawals? Withdrawals: refers to the wide range of symptoms that occur after stopping or dramatically reducing opiate drugs after heavy and prolonged use. Withdrawal symptoms do not occur to patients that use low dose opioids, or those who take the medication sporadically. Contrary to benzodiazepine (example: Valium, Xanax, etc.) or alcohol withdrawals ("Delirium Tremens"), opioid withdrawals are not lethal. Withdrawals are the physical manifestation of the body getting rid of the excess receptors.  Expected Symptoms Early symptoms of withdrawal may include: . Agitation . Anxiety . Muscle aches . Increased tearing . Insomnia . Runny nose . Sweating . Yawning  Late symptoms of withdrawal may include: . Abdominal cramping . Diarrhea . Dilated pupils . Goose bumps . Nausea . Vomiting  Will I experience withdrawals? Due to the slow nature of the taper, it is very unlikely that you will experience any.  What is a slow taper? Taper: refers to the gradual decrease in dose.  (Last update: 10/06/2019) ____________________________________________________________________________________________

## 2020-08-21 NOTE — Progress Notes (Signed)
   Nursing Pain Medication Assessment:  Safety precautions to be maintained throughout the outpatient stay will include: orient to surroundings, keep bed in low position, maintain call bell within reach at all times, provide assistance with transfer out of bed and ambulation.  Medication Inspection Compliance: Pill count conducted under aseptic conditions, in front of the patient. Neither the pills nor the bottle was removed from the patient's sight at any time. Once count was completed pills were immediately returned to the patient in their original bottle.  Medication: Hydrocodone/APAP Pill/Patch Count: 0 of 90 pills remain Pill/Patch Appearance: Markings consistent with prescribed medication Bottle Appearance: Standard pharmacy container. Clearly labeled. Filled Date: 4 / 95 / 2022 Last Medication intake:  Yesterday   Pt was taking 1/2 a pill until yesterday, he had to reschulde is medication refill appointment due to work.

## 2020-08-23 ENCOUNTER — Other Ambulatory Visit: Payer: Self-pay | Admitting: Pain Medicine

## 2020-08-23 ENCOUNTER — Other Ambulatory Visit (HOSPITAL_COMMUNITY): Payer: Self-pay

## 2020-08-23 DIAGNOSIS — M792 Neuralgia and neuritis, unspecified: Secondary | ICD-10-CM

## 2020-08-24 ENCOUNTER — Other Ambulatory Visit (HOSPITAL_COMMUNITY): Payer: Self-pay

## 2020-08-24 MED FILL — Sertraline HCl Tab 100 MG: ORAL | 90 days supply | Qty: 180 | Fill #0 | Status: AC

## 2020-08-25 ENCOUNTER — Other Ambulatory Visit (HOSPITAL_COMMUNITY): Payer: Self-pay

## 2020-08-26 LAB — TOXASSURE SELECT 13 (MW), URINE

## 2020-08-29 ENCOUNTER — Other Ambulatory Visit (HOSPITAL_COMMUNITY): Payer: Self-pay

## 2020-08-30 ENCOUNTER — Ambulatory Visit: Payer: 59 | Admitting: Orthopaedic Surgery

## 2020-08-31 DIAGNOSIS — G8929 Other chronic pain: Secondary | ICD-10-CM | POA: Diagnosis not present

## 2020-08-31 DIAGNOSIS — M25551 Pain in right hip: Secondary | ICD-10-CM | POA: Diagnosis not present

## 2020-08-31 DIAGNOSIS — M545 Low back pain, unspecified: Secondary | ICD-10-CM | POA: Diagnosis not present

## 2020-09-02 ENCOUNTER — Other Ambulatory Visit: Payer: Self-pay | Admitting: Pain Medicine

## 2020-09-02 ENCOUNTER — Other Ambulatory Visit: Payer: Self-pay | Admitting: Endocrinology

## 2020-09-02 DIAGNOSIS — M7918 Myalgia, other site: Secondary | ICD-10-CM

## 2020-09-02 DIAGNOSIS — G8929 Other chronic pain: Secondary | ICD-10-CM

## 2020-09-02 MED FILL — Lisinopril Tab 40 MG: ORAL | 90 days supply | Qty: 90 | Fill #0 | Status: AC

## 2020-09-04 ENCOUNTER — Other Ambulatory Visit: Payer: Self-pay | Admitting: Pain Medicine

## 2020-09-04 ENCOUNTER — Other Ambulatory Visit (HOSPITAL_COMMUNITY): Payer: Self-pay

## 2020-09-04 ENCOUNTER — Other Ambulatory Visit: Payer: Self-pay | Admitting: Endocrinology

## 2020-09-04 DIAGNOSIS — G8929 Other chronic pain: Secondary | ICD-10-CM

## 2020-09-06 ENCOUNTER — Other Ambulatory Visit (HOSPITAL_COMMUNITY): Payer: Self-pay

## 2020-09-06 DIAGNOSIS — E291 Testicular hypofunction: Secondary | ICD-10-CM | POA: Diagnosis not present

## 2020-09-06 DIAGNOSIS — N5201 Erectile dysfunction due to arterial insufficiency: Secondary | ICD-10-CM | POA: Diagnosis not present

## 2020-09-06 MED ORDER — TESTOSTERONE CYPIONATE 100 MG/ML IM SOLN: 100.0000 mg | INTRAMUSCULAR | 1 refills | Status: AC

## 2020-09-07 ENCOUNTER — Other Ambulatory Visit (HOSPITAL_COMMUNITY): Payer: Self-pay

## 2020-09-07 ENCOUNTER — Telehealth: Payer: Self-pay

## 2020-09-07 MED ORDER — LEVOTHYROXINE SODIUM 175 MCG PO TABS
175.0000 ug | ORAL_TABLET | Freq: Every day | ORAL | 2 refills | Status: DC
  Filled 2020-09-07: qty 90, 90d supply, fill #0
  Filled 2020-12-05: qty 90, 90d supply, fill #1
  Filled 2021-03-29: qty 90, 90d supply, fill #2

## 2020-09-07 NOTE — Telephone Encounter (Signed)
Pt is out of Rx Tizanidine & needs a refill.

## 2020-09-07 NOTE — Telephone Encounter (Signed)
Patient advised to call PCP for refill for Tizanidine.

## 2020-09-07 NOTE — Telephone Encounter (Signed)
Patients wife called to advise that patient has been out of medication for 4 days.  Is requesting refill be sent to requesting pharmacy. Original request sent 09/04/20

## 2020-09-08 ENCOUNTER — Other Ambulatory Visit (HOSPITAL_COMMUNITY): Payer: Self-pay

## 2020-09-08 ENCOUNTER — Ambulatory Visit: Payer: 59 | Admitting: Orthopaedic Surgery

## 2020-09-08 ENCOUNTER — Other Ambulatory Visit: Payer: Self-pay | Admitting: Adult Health

## 2020-09-08 DIAGNOSIS — M545 Low back pain, unspecified: Secondary | ICD-10-CM

## 2020-09-08 DIAGNOSIS — G8929 Other chronic pain: Secondary | ICD-10-CM

## 2020-09-08 DIAGNOSIS — M7918 Myalgia, other site: Secondary | ICD-10-CM

## 2020-09-08 MED ORDER — CARESTART COVID-19 HOME TEST VI KIT
PACK | 0 refills | Status: DC
  Filled 2020-09-08: qty 4, 4d supply, fill #0

## 2020-09-08 MED ORDER — TESTOSTERONE CYPIONATE 200 MG/ML IM SOLN
100.0000 mg | INTRAMUSCULAR | 1 refills | Status: DC
  Filled 2020-09-08: qty 1, 28d supply, fill #0
  Filled 2020-09-08: qty 1, 7d supply, fill #0
  Filled 2020-09-18 – 2020-09-19 (×2): qty 1, 7d supply, fill #1

## 2020-09-11 ENCOUNTER — Other Ambulatory Visit: Payer: Self-pay | Admitting: Orthopedic Surgery

## 2020-09-11 DIAGNOSIS — M545 Low back pain, unspecified: Secondary | ICD-10-CM

## 2020-09-12 ENCOUNTER — Other Ambulatory Visit (HOSPITAL_COMMUNITY): Payer: Self-pay

## 2020-09-12 ENCOUNTER — Other Ambulatory Visit: Payer: Self-pay | Admitting: Adult Health

## 2020-09-12 DIAGNOSIS — G8929 Other chronic pain: Secondary | ICD-10-CM

## 2020-09-13 ENCOUNTER — Other Ambulatory Visit: Payer: Self-pay

## 2020-09-14 ENCOUNTER — Other Ambulatory Visit (HOSPITAL_COMMUNITY): Payer: Self-pay

## 2020-09-14 ENCOUNTER — Ambulatory Visit: Payer: 59

## 2020-09-14 ENCOUNTER — Encounter: Payer: 59 | Admitting: Adult Health

## 2020-09-18 MED FILL — Pantoprazole Sodium EC Tab 40 MG (Base Equiv): ORAL | 30 days supply | Qty: 30 | Fill #2 | Status: AC

## 2020-09-19 ENCOUNTER — Other Ambulatory Visit (HOSPITAL_COMMUNITY): Payer: Self-pay

## 2020-09-20 ENCOUNTER — Other Ambulatory Visit (HOSPITAL_COMMUNITY): Payer: Self-pay

## 2020-09-27 ENCOUNTER — Ambulatory Visit
Admission: RE | Admit: 2020-09-27 | Discharge: 2020-09-27 | Disposition: A | Discharge status: Home / Self Care | Payer: 59 | Source: Ambulatory Visit | Attending: Orthopedic Surgery | Admitting: Orthopedic Surgery

## 2020-09-27 ENCOUNTER — Other Ambulatory Visit: Payer: Self-pay

## 2020-09-27 ENCOUNTER — Other Ambulatory Visit (HOSPITAL_COMMUNITY): Payer: Self-pay

## 2020-09-27 DIAGNOSIS — M545 Low back pain, unspecified: Secondary | ICD-10-CM | POA: Insufficient documentation

## 2020-09-27 DIAGNOSIS — G8929 Other chronic pain: Secondary | ICD-10-CM | POA: Diagnosis not present

## 2020-09-27 MED ORDER — TESTOSTERONE CYPIONATE 200 MG/ML IM SOLN
INTRAMUSCULAR | 1 refills | Status: DC
  Filled 2020-09-27: qty 1, 7d supply, fill #0
  Filled 2020-11-29: qty 1, 7d supply, fill #1

## 2020-09-28 ENCOUNTER — Other Ambulatory Visit (HOSPITAL_COMMUNITY): Payer: Self-pay

## 2020-10-11 ENCOUNTER — Other Ambulatory Visit: Payer: Self-pay

## 2020-10-11 ENCOUNTER — Other Ambulatory Visit (HOSPITAL_COMMUNITY): Payer: Self-pay

## 2020-10-11 MED ORDER — CARESTART COVID-19 HOME TEST VI KIT
PACK | 0 refills | Status: DC
  Filled 2020-10-11: qty 4, 4d supply, fill #0

## 2020-10-12 ENCOUNTER — Other Ambulatory Visit (HOSPITAL_COMMUNITY): Payer: Self-pay

## 2020-10-12 ENCOUNTER — Ambulatory Visit (INDEPENDENT_AMBULATORY_CARE_PROVIDER_SITE_OTHER): Payer: 59 | Admitting: Adult Health

## 2020-10-12 ENCOUNTER — Other Ambulatory Visit: Payer: Self-pay | Admitting: Adult Health

## 2020-10-12 ENCOUNTER — Encounter: Payer: Self-pay | Admitting: Adult Health

## 2020-10-12 VITALS — BP 122/88 | HR 95 | Temp 98.1°F | Ht 66.0 in | Wt 203.0 lb

## 2020-10-12 DIAGNOSIS — M792 Neuralgia and neuritis, unspecified: Secondary | ICD-10-CM

## 2020-10-12 DIAGNOSIS — Z0000 Encounter for general adult medical examination without abnormal findings: Secondary | ICD-10-CM

## 2020-10-12 DIAGNOSIS — E782 Mixed hyperlipidemia: Secondary | ICD-10-CM | POA: Diagnosis not present

## 2020-10-12 DIAGNOSIS — Z720 Tobacco use: Secondary | ICD-10-CM | POA: Diagnosis not present

## 2020-10-12 DIAGNOSIS — R7989 Other specified abnormal findings of blood chemistry: Secondary | ICD-10-CM

## 2020-10-12 DIAGNOSIS — G894 Chronic pain syndrome: Secondary | ICD-10-CM

## 2020-10-12 DIAGNOSIS — Z125 Encounter for screening for malignant neoplasm of prostate: Secondary | ICD-10-CM

## 2020-10-12 DIAGNOSIS — M7918 Myalgia, other site: Secondary | ICD-10-CM

## 2020-10-12 DIAGNOSIS — F1010 Alcohol abuse, uncomplicated: Secondary | ICD-10-CM

## 2020-10-12 DIAGNOSIS — E890 Postprocedural hypothyroidism: Secondary | ICD-10-CM | POA: Diagnosis not present

## 2020-10-12 DIAGNOSIS — F419 Anxiety disorder, unspecified: Secondary | ICD-10-CM

## 2020-10-12 DIAGNOSIS — F32A Depression, unspecified: Secondary | ICD-10-CM

## 2020-10-12 DIAGNOSIS — G8929 Other chronic pain: Secondary | ICD-10-CM

## 2020-10-12 DIAGNOSIS — I10 Essential (primary) hypertension: Secondary | ICD-10-CM

## 2020-10-12 LAB — COMPREHENSIVE METABOLIC PANEL
ALT: 24 U/L (ref 0–53)
AST: 19 U/L (ref 0–37)
Albumin: 4.3 g/dL (ref 3.5–5.2)
Alkaline Phosphatase: 59 U/L (ref 39–117)
BUN: 15 mg/dL (ref 6–23)
CO2: 28 mEq/L (ref 19–32)
Calcium: 9.4 mg/dL (ref 8.4–10.5)
Chloride: 102 mEq/L (ref 96–112)
Creatinine, Ser: 0.85 mg/dL (ref 0.40–1.50)
GFR: 104.18 mL/min (ref >60.00)
Glucose, Bld: 113 mg/dL — ABNORMAL HIGH (ref 70–99)
Potassium: 3.9 mEq/L (ref 3.5–5.1)
Sodium: 140 mEq/L (ref 135–145)
Total Bilirubin: 0.5 mg/dL (ref 0.2–1.2)
Total Protein: 6.8 g/dL (ref 6.0–8.3)

## 2020-10-12 LAB — CBC WITH DIFFERENTIAL/PLATELET
Basophils Absolute: 0.1 10*3/uL (ref 0.0–0.1)
Basophils Relative: 1 % (ref 0.0–3.0)
Eosinophils Absolute: 0.2 10*3/uL (ref 0.0–0.7)
Eosinophils Relative: 2.8 % (ref 0.0–5.0)
HCT: 40.6 % (ref 39.0–52.0)
Hemoglobin: 14 g/dL (ref 13.0–17.0)
Lymphocytes Relative: 19.7 % (ref 12.0–46.0)
Lymphs Abs: 1.3 10*3/uL (ref 0.7–4.0)
MCHC: 34.6 g/dL (ref 30.0–36.0)
MCV: 97.3 fl (ref 78.0–100.0)
Monocytes Absolute: 0.7 10*3/uL (ref 0.1–1.0)
Monocytes Relative: 11.3 % (ref 3.0–12.0)
Neutro Abs: 4.2 10*3/uL (ref 1.4–7.7)
Neutrophils Relative %: 65.2 % (ref 43.0–77.0)
Platelets: 188 10*3/uL (ref 150.0–400.0)
RBC: 4.17 Mil/uL — ABNORMAL LOW (ref 4.22–5.81)
RDW: 14.9 % (ref 11.5–15.5)
WBC: 6.5 10*3/uL (ref 4.0–10.5)

## 2020-10-12 LAB — TSH: TSH: 2.25 u[IU]/mL (ref 0.35–5.50)

## 2020-10-12 LAB — HEMOGLOBIN A1C: Hgb A1c MFr Bld: 5.8 % (ref 4.6–6.5)

## 2020-10-12 LAB — PSA: PSA: 0.42 ng/mL (ref 0.10–4.00)

## 2020-10-12 LAB — LIPID PANEL
Cholesterol: 142 mg/dL (ref 0–200)
HDL: 33.1 mg/dL — ABNORMAL LOW (ref >39.00)
Total CHOL/HDL Ratio: 4
Triglycerides: 439 mg/dL — ABNORMAL HIGH (ref 0.0–149.0)

## 2020-10-12 LAB — LDL CHOLESTEROL, DIRECT: Direct LDL: 61 mg/dL

## 2020-10-12 MED ORDER — LISINOPRIL 40 MG PO TABS
40.0000 mg | ORAL_TABLET | Freq: Every day | ORAL | 3 refills | Status: DC
Start: 2020-10-12 — End: 2021-12-04
  Filled 2020-10-12: qty 90, fill #0
  Filled 2020-12-11: qty 90, 90d supply, fill #0
  Filled 2021-03-29: qty 90, 90d supply, fill #1
  Filled 2021-09-11: qty 90, 90d supply, fill #0

## 2020-10-12 MED ORDER — ATORVASTATIN CALCIUM 40 MG PO TABS
40.0000 mg | ORAL_TABLET | Freq: Every day | ORAL | 3 refills | Status: DC
  Filled 2020-10-12: qty 87, 87d supply, fill #0
  Filled 2020-10-12: qty 3, 3d supply, fill #0
  Filled 2021-01-22: qty 90, 90d supply, fill #1
  Filled 2021-05-08: qty 90, 90d supply, fill #0
  Filled 2021-09-11: qty 90, 90d supply, fill #1

## 2020-10-12 MED ORDER — PREGABALIN 225 MG PO CAPS
225.0000 mg | ORAL_CAPSULE | Freq: Two times a day (BID) | ORAL | 2 refills | Status: DC
Start: 2020-10-12 — End: 2021-05-08
  Filled 2020-10-12: qty 1, 1d supply, fill #0
  Filled 2020-10-12: qty 59, 29d supply, fill #0
  Filled 2020-12-05: qty 60, 30d supply, fill #1
  Filled 2021-02-11: qty 60, 30d supply, fill #2

## 2020-10-12 MED ORDER — TIZANIDINE HCL 2 MG PO TABS
2.0000 mg | ORAL_TABLET | Freq: Every day | ORAL | 2 refills | Status: DC
Start: 2020-10-12 — End: 2021-08-17
  Filled 2020-10-12: qty 60, 30d supply, fill #0
  Filled 2021-02-11: qty 60, 30d supply, fill #1
  Filled 2021-04-20: qty 60, 30d supply, fill #2

## 2020-10-12 NOTE — Progress Notes (Signed)
Subjective:    Patient ID: Chad Wilson, male    DOB: April 28, 1975, 45 y.o.   MRN: [id number]  HPI Patient presents for yearly preventative medicine examination. He is a pleasant 45 year old male who  has a past medical history of Acute hepatitis C without mention of hepatic coma(070.51), Acute postoperative pain (12/10/2018), Alcohol abuse, unspecified, Anxiety and depression, Anxiety states, Essential hypertension, benign, GERD (gastroesophageal reflux disease), Graves disease, Panic disorder without agoraphobia, Thyroid disease, and Tobacco use disorder.  Graves Disease - is managed by Endocrinology. No recent changes in medication. Continues to take synthroid 175 mcg daily.   Anxiety/Depression -takes Zoloft 200 mg daily, BuSpar 5 mg twice daily and Klonopin 0.5 mg twice daily. He feels as though his symptoms are well controlled.   Hyperlipidemia -prescribe Lipitor 40 mg daily.  He denies myalgia or fatigue Lab Results  Component Value Date   CHOL 131 09/02/2019   HDL 39.80 09/02/2019   LDLCALC 66 10/30/2016   LDLDIRECT 62.0 09/02/2019   TRIG 245.0 (H) 09/02/2019   CHOLHDL 3 09/02/2019   GERD - Controlled with Protonix 40 mg daily   Chronic Back pain -managed by pain management.  Currently prescribed Norco 5 mg/325 mg every 6 hours and Lyrica to 225 mg twice daily. Also takes Tizanidine QHS. It looks like pain management wants his PCP to take over Lyrica and MSK relaxer.   Low testosterone-Monitored by urology.  Does testosterone injections every 14 days  Tobacco use- Continues to smoke. Understands he needs to quit   Alcohol use- has about 42 drinks a week.   Hypertension - takes lisinopril 40 mg daily. He denies dizziness, lightheadedness, blurred vision, or headaches BP Readings from Last 3 Encounters:  10/12/20 122/88  08/21/20 (!) 168/93  06/22/20 (!) 148/84    All immunizations and health maintenance protocols were reviewed with the patient and needed orders were  placed.  Appropriate screening laboratory values were ordered for the patient including screening of hyperlipidemia, renal function and hepatic function. If indicated by BPH, a PSA was ordered.  Medication reconciliation,  past medical history, social history, problem list and allergies were reviewed in detail with the patient  Goals were established with regard to weight loss, exercise, and  diet in compliance with medications Wt Readings from Last 3 Encounters:  10/12/20 203 lb (92.1 kg)  08/21/20 220 lb (99.8 kg)  06/22/20 206 lb (93.4 kg)    Review of Systems  Constitutional: Negative.   HENT: Negative.    Eyes: Negative.   Respiratory: Negative.    Cardiovascular: Negative.   Gastrointestinal: Negative.   Endocrine: Negative.   Genitourinary: Negative.   Musculoskeletal:  Positive for arthralgias and back pain.  Skin: Negative.   Allergic/Immunologic: Negative.   Neurological: Negative.   Hematological: Negative.   Psychiatric/Behavioral: Negative.    All other systems reviewed and are negative.  Past Medical History:  Diagnosis Date   Acute hepatitis C without mention of hepatic coma(070.51)    has been cleared    Acute postoperative pain 12/10/2018   Alcohol abuse, unspecified    Anxiety and depression    Anxiety states    Essential hypertension, benign    GERD (gastroesophageal reflux disease)    Graves disease    treated by Dr Loanne Drilling   Panic disorder without agoraphobia    Thyroid disease    Graves Disease   Tobacco use disorder     Social History   Socioeconomic History   Marital status:  Married    Spouse name: Not on file   Number of children: 0   Years of education: Not on file   Highest education level: Not on file  Occupational History    Employer: [employer]  Tobacco Use   Smoking status: Every Day    Packs/day: 1.00    Years: 30.00    Pack years: 30.00    Types: Cigarettes   Smokeless tobacco: Current    Types: Chew   Vaping Use   Vaping Use: Never used  Substance and Sexual Activity   Alcohol use: Yes    Alcohol/week: 42.0 standard drinks    Types: 42 Cans of beer per week    Comment: 6 pack of beer daily   Drug use: Not Currently    Comment: hx of cocaine  last dose 7 yrs ago   Sexual activity: Not on file  Other Topics Concern   Not on file  Social History Narrative   He is working in [employer]    Diet: Fruits and veggies, water poweraid, occ sweet tea   Social Determinants of Radio broadcast assistant Strain: Not on file  Food Insecurity: Not on file  Transportation Needs: Not on file  Physical Activity: Not on file  Stress: Not on file  Social Connections: Not on file  Intimate Partner Violence: Not on file    Past Surgical History:  Procedure Laterality Date   ANTERIOR CERVICAL DECOMP/DISCECTOMY FUSION N/A 05/21/2017   Procedure: Anterior Cervical Decompression Fusion Cervical five-six, Cervical six-seven;  Surgeon: Eustace Moore, MD;  Location: [facility];  Service: Neurosurgery;  Laterality: N/A;   MULTIPLE TOOTH EXTRACTIONS  15 yrs. ago   WISDOM TOOTH EXTRACTION      Family History  Problem Relation Age of Onset   Anxiety disorder Father    Heart disease Father    Hypertension Father    Thyroid disease Mother    Anxiety disorder Sister    Hyperthyroidism Sister    Colon cancer Neg Hx    Esophageal cancer Neg Hx    Pancreatic cancer Neg Hx     Allergies  Allergen Reactions   Chantix [Varenicline] Other (See Comments)    Agitation and sleep walking     Current Outpatient Medications on File Prior to Visit  Medication Sig Dispense Refill   albuterol (VENTOLIN HFA) 108 (90 Base) MCG/ACT inhaler INHALE 2 PUFFS BY MOUTH EVERY 6 HOURS AS NEEDED. **DUE FOR YEARLY PHYSICAL** 8.5 g 0   ALPRAZolam (XANAX) 0.5 MG tablet Take 1 tablet (0.5 mg total) by mouth 2 (two) times daily. 60 tablet 2   busPIRone (BUSPAR) 5 MG tablet Take 1 tablet (5 mg total) by mouth 2 (two) times  daily. 180 tablet 0   Colchicine 0.6 MG CAPS Take 1 tablet by mouth 2 (two) times daily as needed. 60 capsule 3   COVID-19 At Home Antigen Test (CARESTART COVID-19 HOME TEST) KIT Use as directed within package instructions. 4 each 0   DEPO-TESTOSTERONE 200 MG/ML injection Take 200 mg by mouth every 14 (fourteen) days.     HYDROcodone-acetaminophen (NORCO/VICODIN) 5-325 MG tablet Take 1 tablet by mouth 3 (three) times daily as needed for severe pain. Must last 30 days 90 tablet 0   [START ON 10/20/2020] HYDROcodone-acetaminophen (NORCO/VICODIN) 5-325 MG tablet Take 1 tablet by mouth 3 (three) times daily as needed for severe pain. Must last 30 days 90 tablet 0   levothyroxine (SYNTHROID) 175 MCG tablet Take 1 tablet (175  mcg total) by mouth daily before breakfast. 90 tablet 2   lisinopril (ZESTRIL) 40 MG tablet TAKE 1 TABLET BY MOUTH ONCE DAILY. 90 tablet 1   nabumetone (RELAFEN) 750 MG tablet Take 1 tablet (750 mg total) by mouth 2 (two) times daily as needed. 60 tablet 6   nitroGLYCERIN (NITRO-DUR) 0.1 mg/hr patch Apply 1/4 patch to affected area 12 hours daily 30 patch 3   pantoprazole (PROTONIX) 40 MG tablet TAKE 1 TABLET (40 MG TOTAL) BY MOUTH DAILY. 30 tablet 6   sertraline (ZOLOFT) 100 MG tablet TAKE 2 TABLETS BY MOUTH DAILY. 180 tablet 1   sildenafil (REVATIO) 20 MG tablet TAKE 1 TO 5 TABLETS BY MOUTH DAILY AS NEEDED FOR ERECTILE DYSFUNCTION 90 tablet 1   testosterone cypionate (DEPOTESTOSTERONE CYPIONATE) 200 MG/ML injection inject 0.41m intramuscularly once a week 1 mL 1   testosterone cypionate (DEPOTESTOTERONE CYPIONATE) 100 MG/ML injection Inject 1 mL (100 mg total) into the muscle once a week. 5 mL 1   atorvastatin (LIPITOR) 40 MG tablet TAKE 1 TABLET (40 MG TOTAL) BY MOUTH AT BEDTIME. 90 tablet 3   HYDROcodone-acetaminophen (NORCO/VICODIN) 5-325 MG tablet Take 1 tablet by mouth 3 (three) times daily as needed for severe pain. Must last 30 days 90 tablet 0   pregabalin (LYRICA) 225 MG  capsule TAKE 1 CAPSULE (225 MG TOTAL) BY MOUTH 2 (TWO) TIMES DAILY. 60 capsule 2   testosterone cypionate (DEPOTESTOSTERONE CYPIONATE) 200 MG/ML injection INJECT 1 ML INTO THE MUSCLE EVERY 2 WEEKS. 4 mL 1   [DISCONTINUED] omeprazole (PRILOSEC) 20 MG capsule Take 1 capsule (20 mg total) by mouth daily. 30 capsule 3   No current facility-administered medications on file prior to visit.    BP 122/88   Pulse 95   Temp 98.1 F (36.7 C) (Oral)   Ht 5' 6"  (1.676 m)   Wt 203 lb (92.1 kg)   SpO2 90%   BMI 32.77 kg/m        Objective:   Physical Exam Vitals and nursing note reviewed.  Constitutional:      General: He is not in acute distress.    Appearance: Normal appearance. He is well-developed and normal weight.  HENT:     Head: Normocephalic and atraumatic.     Right Ear: Tympanic membrane, ear canal and external ear normal. There is no impacted cerumen.     Left Ear: Tympanic membrane, ear canal and external ear normal. There is no impacted cerumen.     Nose: Nose normal. No congestion or rhinorrhea.     Mouth/Throat:     Mouth: Mucous membranes are moist.     Pharynx: Oropharynx is clear. No oropharyngeal exudate or posterior oropharyngeal erythema.  Eyes:     General:        Right eye: No discharge.        Left eye: No discharge.     Extraocular Movements: Extraocular movements intact.     Conjunctiva/sclera: Conjunctivae normal.     Pupils: Pupils are equal, round, and reactive to light.  Neck:     Vascular: No carotid bruit.     Trachea: No tracheal deviation.  Cardiovascular:     Rate and Rhythm: Normal rate and regular rhythm.     Pulses: Normal pulses.     Heart sounds: Normal heart sounds. No murmur heard.   No friction rub. No gallop.  Pulmonary:     Effort: Pulmonary effort is normal. No respiratory distress.     Breath sounds:  Normal breath sounds. No stridor. No wheezing, rhonchi or rales.  Chest:     Chest wall: No tenderness.  Abdominal:     General:  Bowel sounds are normal. There is no distension.     Palpations: Abdomen is soft. There is no mass.     Tenderness: There is no abdominal tenderness. There is no right CVA tenderness, left CVA tenderness, guarding or rebound.     Hernia: No hernia is present.  Musculoskeletal:        General: No swelling, tenderness, deformity or signs of injury. Normal range of motion.     Right lower leg: No edema.     Left lower leg: No edema.  Lymphadenopathy:     Cervical: No cervical adenopathy.  Skin:    General: Skin is warm and dry.     Capillary Refill: Capillary refill takes less than 2 seconds.     Coloration: Skin is not jaundiced or pale.     Findings: No bruising, erythema, lesion or rash.  Neurological:     General: No focal deficit present.     Mental Status: He is alert and oriented to person, place, and time.     Cranial Nerves: No cranial nerve deficit.     Sensory: No sensory deficit.     Motor: No weakness.     Coordination: Coordination normal.     Gait: Gait normal.     Deep Tendon Reflexes: Reflexes normal.  Psychiatric:        Mood and Affect: Mood normal.        Behavior: Behavior normal.        Thought Content: Thought content normal.        Judgment: Judgment normal.      Assessment & Plan:   1. Routine general medical examination at a health care facility - Follow up in one year or sooner if needed - CBC with Differential/Platelet; Future - Comprehensive metabolic panel; Future - Hemoglobin A1c; Future - Lipid panel; Future - TSH; Future - TSH - Lipid panel - Hemoglobin A1c - Comprehensive metabolic panel - CBC with Differential/Platelet  2. Anxiety and depression - Continue current treatment. Well controlled.   3. Low testosterone - Follow up with Urology as directed - CBC with Differential/Platelet; Future - Comprehensive metabolic panel; Future - Hemoglobin A1c; Future - Lipid panel; Future - TSH; Future - TSH - Lipid panel - Hemoglobin A1c -  Comprehensive metabolic panel - CBC with Differential/Platelet  4. Prostate cancer screening  - PSA; Future - PSA  5. Postablative hypothyroidism - Follow up with Endocrinology as directed - CBC with Differential/Platelet; Future - Comprehensive metabolic panel; Future - Hemoglobin A1c; Future - Lipid panel; Future - TSH; Future - TSH - Lipid panel - Hemoglobin A1c - Comprehensive metabolic panel - CBC with Differential/Platelet  6. Chronic pain syndrome  - tiZANidine (ZANAFLEX) 2 MG tablet; Take 1-2 tablets (2-4 mg total) by mouth at bedtime.  Dispense: 60 tablet; Refill: 2  7. Tobacco use - Encouraged to quit smoking   8. Mixed hyperlipidemia - Consider dose change  - CBC with Differential/Platelet; Future - Comprehensive metabolic panel; Future - Hemoglobin A1c; Future - Lipid panel; Future - TSH; Future - TSH - Lipid panel - Hemoglobin A1c - Comprehensive metabolic panel - CBC with Differential/Platelet  9. HYPERTENSION, BENIGN ESSENTIAL - Well controlled. No change in medications  - CBC with Differential/Platelet; Future - Comprehensive metabolic panel; Future - Hemoglobin A1c; Future - Lipid panel; Future - TSH;  Future - TSH - Lipid panel - Hemoglobin A1c - Comprehensive metabolic panel - CBC with Differential/Platelet - lisinopril (ZESTRIL) 40 MG tablet; Take 1 tablet (40 mg total) by mouth daily.  Dispense: 90 tablet; Refill: 3  10. Neurogenic pain - Will need to follow up every three months for refills.  - pregabalin (LYRICA) 225 MG capsule; TAKE 1 CAPSULE (225 MG TOTAL) BY MOUTH 2 (TWO) TIMES DAILY.  Dispense: 60 capsule; Refill: 2  11 Chronic musculoskeletal pain  - tiZANidine (ZANAFLEX) 2 MG tablet; Take 1-2 tablets (2-4 mg total) by mouth at bedtime.  Dispense: 60 tablet; Refill: 2  12. Alcohol abuse - Encouraged to cut back on alcohol consumption. No more than two drinsk a day   Dorothyann Peng, NP

## 2020-10-13 ENCOUNTER — Telehealth: Payer: 59 | Admitting: Nurse Practitioner

## 2020-10-13 DIAGNOSIS — U071 COVID-19: Secondary | ICD-10-CM

## 2020-10-13 MED ORDER — NIRMATRELVIR/RITONAVIR (PAXLOVID)TABLET: 3.0000 | ORAL_TABLET | Freq: Two times a day (BID) | ORAL | 0 refills | Status: AC

## 2020-10-13 NOTE — Progress Notes (Signed)
Virtual Visit Consent   Chad Wilson, you are scheduled for a virtual visit with Chad Wilson, [place], a [facility] provider, today.     Just as with appointments in the office, your consent must be obtained to participate.  Your consent will be active for this visit and any virtual visit you may have with one of our providers in the next 365 days.     If you have a MyChart account, a copy of this consent can be sent to you electronically.  All virtual visits are billed to your insurance company just like a traditional visit in the office.    As this is a virtual visit, video technology does not allow for your provider to perform a traditional examination.  This may limit your provider's ability to fully assess your condition.  If your provider identifies any concerns that need to be evaluated in person or the need to arrange testing (such as labs, EKG, etc.), we will make arrangements to do so.     Although advances in technology are sophisticated, we cannot ensure that it will always work on either your end or our end.  If the connection with a video visit is poor, the visit may have to be switched to a telephone visit.  With either a video or telephone visit, we are not always able to ensure that we have a secure connection.     I need to obtain your verbal consent now.   Are you willing to proceed with your visit today? YES   Chad Wilson has provided verbal consent on 10/13/2020 for a virtual visit (video or telephone).   Chad Hassell Done, FNP   Date: 10/13/2020 5:57 PM   Virtual Visit via Video Note   I, Chad Wilson, connected with Chad E Kamps ([id number], [birth year]) on 10/13/20 at  by a telephone application and verified that I am speaking with the correct person using two identifiers.  Location: Patient: Virtual Visit Location Patient: Home Provider: Virtual Visit Location Provider: Mobile   I discussed the limitations of evaluation and management by  telemedicine and the availability of in person appointments. The patient expressed understanding and agreed to proceed.    History of Present Illness: Chad Wilson is a 45 y.o. who identifies as a male who was assigned male at birth, and is being seen today for covid positive.  HPI:  Patient started having body aches and headache this morning. He took motrin which helped so he went on to work, headache returned shortly after getting to work, went home and tested positive for covid. Now the roof of mouth is hurtug and he has sinus congestion.  Review of Systems  Constitutional:  Positive for fever (low grade) and malaise/fatigue. Negative for chills.  HENT:  Positive for congestion and sore throat.   Respiratory:  Positive for cough and sputum production. Negative for shortness of breath.   Musculoskeletal:  Positive for myalgias.  Neurological:  Positive for headaches.    Problems: \ Patient Active Problem List   Diagnosis Date Noted   Abnormal MRI, hip (Right) (07/22/2020) 08/21/2020   Degenerative tear of acetabular labrum of hip (Right) 08/21/2020   Chronic use of opiate for therapeutic purpose 07/24/2020   Chronic musculoskeletal pain 07/13/2020   Greater trochanteric bursitis (Right) 06/22/2020   Enthesopathy of hip region (Right) 06/07/2020   Other bursitis of hip (Right) 06/07/2020   Chronic hip pain (Right) 15/61/5379   Uncomplicated opioid dependence ([place]) 02/24/2020  Mixed hyperlipidemia 09/02/2019   Neurogenic pain 10/21/2018   Preoperative testing 08/17/2018   Grade 1 Lumbosacral Retrolisthesis of L5/S1 06/29/2018   Lumbar facet hypertrophy (Bilateral) 06/29/2018   Osteoarthritis of facet joint of lumbar spine 06/29/2018   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   Lumbar spondylosis 06/29/2018   DDD (degenerative disc disease), cervical 06/29/2018   Cervicalgia 06/29/2018   Abnormal MRI, lumbar spine 06/29/2018   Long term prescription benzodiazepine use 06/29/2018    Vitamin B12 deficiency 06/28/2018   Vitamin D insufficiency 06/28/2018   Spondylosis without myelopathy or radiculopathy, lumbosacral region 06/28/2018   Chronic pain syndrome 06/03/2018   Pharmacologic therapy 06/03/2018   Long term current use of opiate analgesic 06/03/2018   Disorder of skeletal system 06/03/2018   Problems influencing health status 06/03/2018   Chronic low back pain (1ry area of Pain) (Bilateral) (R=L) w/o sciatica 06/03/2018   DDD (degenerative disc disease), lumbosacral 04/06/2018   Lumbar facet syndrome (Bilateral) 04/06/2018   Status post lumbar spine surgery for decompression of spinal cord 05/21/2017   History of cervical spinal surgery 05/21/2017   Hypothyroidism 04/14/2017   Chronic neck pain with history of cervical spinal surgery 09/06/2011   Cervical radiculopathy at C6 09/06/2011   JAUNDICE 05/21/2010   HEPATITIS C 11/21/2009   Anxiety and depression 11/21/2009   PANIC ATTACK 11/21/2009   Alcohol abuse 11/21/2009   Tobacco abuse 11/21/2009   HYPERTENSION, BENIGN ESSENTIAL 11/21/2009   FATIGUE 11/21/2009    Allergies:  Allergies  Allergen Reactions   Chantix [Varenicline] Other (See Comments)    Agitation and sleep walking    Medications:  Current Outpatient Medications:    albuterol (VENTOLIN HFA) 108 (90 Base) MCG/ACT inhaler, INHALE 2 PUFFS BY MOUTH EVERY 6 HOURS AS NEEDED. **DUE FOR YEARLY PHYSICAL**, Disp: 8.5 g, Rfl: 0   ALPRAZolam (XANAX) 0.5 MG tablet, Take 1 tablet (0.5 mg total) by mouth 2 (two) times daily., Disp: 60 tablet, Rfl: 2   atorvastatin (LIPITOR) 40 MG tablet, Take 1 tablet (40 mg total) by mouth at bedtime., Disp: 90 tablet, Rfl: 3   busPIRone (BUSPAR) 5 MG tablet, Take 1 tablet (5 mg total) by mouth 2 (two) times daily., Disp: 180 tablet, Rfl: 0   Colchicine 0.6 MG CAPS, Take 1 tablet by mouth 2 (two) times daily as needed., Disp: 60 capsule, Rfl: 3   COVID-19 At Home Antigen Test (CARESTART COVID-19 HOME TEST) KIT, Use  as directed within package instructions., Disp: 4 each, Rfl: 0   DEPO-TESTOSTERONE 200 MG/ML injection, Take 200 mg by mouth every 14 (fourteen) days., Disp: , Rfl:    HYDROcodone-acetaminophen (NORCO/VICODIN) 5-325 MG tablet, Take 1 tablet by mouth 3 (three) times daily as needed for severe pain. Must last 30 days, Disp: 90 tablet, Rfl: 0   HYDROcodone-acetaminophen (NORCO/VICODIN) 5-325 MG tablet, Take 1 tablet by mouth 3 (three) times daily as needed for severe pain. Must last 30 days, Disp: 90 tablet, Rfl: 0   [START ON 10/20/2020] HYDROcodone-acetaminophen (NORCO/VICODIN) 5-325 MG tablet, Take 1 tablet by mouth 3 (three) times daily as needed for severe pain. Must last 30 days, Disp: 90 tablet, Rfl: 0   levothyroxine (SYNTHROID) 175 MCG tablet, Take 1 tablet (175 mcg total) by mouth daily before breakfast., Disp: 90 tablet, Rfl: 2   lisinopril (ZESTRIL) 40 MG tablet, Take 1 tablet (40 mg total) by mouth daily., Disp: 90 tablet, Rfl: 3   nabumetone (RELAFEN) 750 MG tablet, Take 1 tablet (750 mg total) by mouth 2 (two)  times daily as needed., Disp: 60 tablet, Rfl: 6   nitroGLYCERIN (NITRO-DUR) 0.1 mg/hr patch, Apply 1/4 patch to affected area 12 hours daily, Disp: 30 patch, Rfl: 3   pantoprazole (PROTONIX) 40 MG tablet, TAKE 1 TABLET (40 MG TOTAL) BY MOUTH DAILY., Disp: 30 tablet, Rfl: 6   pregabalin (LYRICA) 225 MG capsule, Take 1 capsule (225 mg total) by mouth 2 (two) times daily., Disp: 60 capsule, Rfl: 2   sertraline (ZOLOFT) 100 MG tablet, TAKE 2 TABLETS BY MOUTH DAILY., Disp: 180 tablet, Rfl: 1   sildenafil (REVATIO) 20 MG tablet, TAKE 1 TO 5 TABLETS BY MOUTH DAILY AS NEEDED FOR ERECTILE DYSFUNCTION, Disp: 90 tablet, Rfl: 1   testosterone cypionate (DEPOTESTOSTERONE CYPIONATE) 200 MG/ML injection, INJECT 1 ML INTO THE MUSCLE EVERY 2 WEEKS., Disp: 4 mL, Rfl: 1   testosterone cypionate (DEPOTESTOSTERONE CYPIONATE) 200 MG/ML injection, inject 0.57m intramuscularly once a week, Disp: 1 mL, Rfl: 1    testosterone cypionate (DEPOTESTOTERONE CYPIONATE) 100 MG/ML injection, Inject 1 mL (100 mg total) into the muscle once a week., Disp: 5 mL, Rfl: 1   tiZANidine (ZANAFLEX) 2 MG tablet, Take 1-2 tablets (2-4 mg total) by mouth at bedtime., Disp: 60 tablet, Rfl: 2  Observations/Objective: Patient is well-developed, well-nourished in no acute distress.  Resting comfortably  at home.  Head is normocephalic, atraumatic.  No labored breathing.  Speech is clear and coherent with logical content.  Patient is alert and oriented at baseline.  Voice hoarse Slight cough  Assessment and Plan: CMaliE Bramlett in today with chief complaint of No chief complaint on file.   1. Lab test positive for detection of COVID-19 virus 1. Take meds as prescribed 2. Use a cool mist humidifier especially during the winter months and when heat has been humid. 3. Use saline nose sprays frequently 4. Saline irrigations of the nose can be very helpful if Wilson frequently.  * 4X daily for 1 week*  * Use of a nettie pot can be helpful with this. Follow directions with this* 5. Drink plenty of fluids 6. Keep thermostat turn down low 7.For any cough or congestion  Use plain Mucinex- regular strength or max strength is fine   * Children- consult with Pharmacist for dosing 8. For fever or aces or pains- take tylenol or ibuprofen appropriate for age and weight.  * for fevers greater than 101 orally you may alternate ibuprofen and tylenol every  3 hours.   Meds ordered this encounter  Medications   nirmatrelvir/ritonavir EUA (PAXLOVID) TABS    Sig: Take 3 tablets by mouth 2 (two) times daily for 5 days. (Take nirmatrelvir 150 mg two tablets twice daily for 5 days and ritonavir 100 mg one tablet twice daily for 5 days) Patient GFR is 104    Dispense:  30 tablet    Refill:  0    Order Specific Question:   Supervising Provider    Answer:   MNoemi Chapel[[id number]]        Follow Up Instructions: I discussed the assessment  and treatment plan with the patient. The patient was provided an opportunity to ask questions and all were answered. The patient agreed with the plan and demonstrated an understanding of the instructions.  A copy of instructions were sent to the patient via MyChart.  The patient was advised to call back or seek an in-person evaluation if the symptoms worsen or if the condition fails to improve as anticipated.  Time:  I spent 10 minutes  with the patient via telehealth technology discussing the above problems/concerns.    Chad Hassell Done, FNP

## 2020-10-18 ENCOUNTER — Other Ambulatory Visit (HOSPITAL_COMMUNITY): Payer: Self-pay

## 2020-10-18 ENCOUNTER — Other Ambulatory Visit: Payer: Self-pay

## 2020-10-18 ENCOUNTER — Telehealth: Payer: Self-pay | Admitting: Adult Health

## 2020-10-18 MED ORDER — FENOFIBRATE 145 MG PO TABS
145.0000 mg | ORAL_TABLET | Freq: Every day | ORAL | 0 refills | Status: DC
  Filled 2020-10-18: qty 90, 90d supply, fill #0

## 2020-10-18 NOTE — Telephone Encounter (Signed)
PT returning the missed call about labs

## 2020-10-23 ENCOUNTER — Other Ambulatory Visit (HOSPITAL_COMMUNITY): Payer: Self-pay

## 2020-10-23 MED FILL — Pantoprazole Sodium EC Tab 40 MG (Base Equiv): ORAL | 30 days supply | Qty: 30 | Fill #3 | Status: AC

## 2020-10-24 ENCOUNTER — Other Ambulatory Visit (HOSPITAL_COMMUNITY): Payer: Self-pay

## 2020-10-30 ENCOUNTER — Other Ambulatory Visit (HOSPITAL_COMMUNITY): Payer: Self-pay

## 2020-11-13 NOTE — Progress Notes (Signed)
Patient: Chad Wilson  Service Category: E/M  Provider: [name], MD  DOB: 1975-11-01  DOS: 11/15/2020  Location: Office  MRN: [id number]  Setting: Ambulatory outpatient  Referring Provider: Dorothyann Peng, NP  Type: Established Patient  Specialty: Interventional Pain Management  PCP: Dorothyann Peng, NP  Location: Remote location  Delivery: TeleHealth     Virtual Encounter - Pain Management PROVIDER NOTE: Information contained herein reflects review and annotations entered in association with encounter. Interpretation of such information and data should be left to medically-trained personnel. Information provided to patient can be located elsewhere in the medical record under "Patient Instructions". Document created using STT-dictation technology, any transcriptional errors that may result from process are unintentional.    Contact & Pharmacy Preferred: [phone] Home: [phone] (home) Mobile: [phone] (mobile) E-mail: [email or web address]  Chad Wilson Phone: [phone] Fax: [facility] [place], [address] [place] [address] Phone: [phone] Fax: [phone]   Pre-screening  [name] offered "in-person" vs "virtual" encounter. He indicated preferring virtual for this encounter.   Reason COVID-19*  Social distancing based on CDC and AMA recommendations.   I contacted Chad Wilson on 11/15/2020 via telephone.      I clearly identified myself as [name], MD. I verified that I was speaking with the correct person using two identifiers (Name: Chad Wilson, and date of birth: December 24, 1975).  Consent I sought verbal advanced consent from Chad Wilson for virtual visit interactions. I informed Chad Wilson of possible security and privacy concerns, risks, and limitations associated with  providing "not-in-person" medical evaluation and management services. I also informed Chad Wilson of the availability of "in-person" appointments. Finally, I informed him that there would be a charge for the virtual visit and that he could be  personally, fully or partially, financially responsible for it. Chad Wilson expressed understanding and agreed to proceed.   Historic Elements   [name] is a 45 y.o. year old, male patient evaluated today after our last contact on 09/04/2020. Chad Wilson  has a past medical history of Acute hepatitis C without mention of hepatic coma(070.51), Acute postoperative pain (12/10/2018), Alcohol abuse, unspecified, Anxiety and depression, Anxiety states, Essential hypertension, benign, GERD (gastroesophageal reflux disease), Graves disease, Panic disorder without agoraphobia, Thyroid disease, and Tobacco use disorder. He also  has a past surgical history that includes Multiple tooth extractions (15 yrs. ago); Anterior cervical decomp/discectomy fusion (N/A, 05/21/2017); and Wisdom tooth extraction. Chad Wilson has a current medication list which includes the following prescription(s): albuterol, alprazolam, atorvastatin, buspirone, colchicine, carestart covid-19 home test, depo-testosterone, fenofibrate, [START ON 11/22/2020] hydrocodone-acetaminophen, levothyroxine, lisinopril, nabumetone, nitroglycerin, pantoprazole, pregabalin, sertraline, sildenafil, testosterone cypionate, testosterone cypionate, tizanidine, testosterone cypionate, and [DISCONTINUED] omeprazole. He  reports that he has been smoking cigarettes. He has a 30.00 pack-year smoking history. His smokeless tobacco use includes chew. He reports current alcohol use of about 42.0 standard drinks per week. He reports that he does not currently use drugs. Chad Wilson is allergic to chantix [varenicline].   HPI  Today, he is being contacted for medication management.  The patient indicates doing well with the current medication  regimen. No adverse reactions or side effects reported to the medications.   RTCB: 12/22/2020  Pharmacotherapy Assessment   Analgesic: Hydrocodone/APAP 5/325, 1 tab p.o. every 8 hours (15 mg/day  of hydrocodone) MME/day: 15 mg/day.   Monitoring: [place] PMP: PDMP reviewed during this encounter.       Pharmacotherapy: No side-effects or adverse reactions reported. Compliance: No problems identified. Effectiveness: Clinically acceptable. Plan: Refer to "POC". UDS:  Summary  Date Value Ref Range Status  08/21/2020 Note  Final    Comment:    ==================================================================== [facility] Select 13 (MW) ==================================================================== Test                             Result       Flag       Units  Drug Present and Declared for Prescription Verification   Alprazolam                     142          EXPECTED   ng/mg creat   Alpha-hydroxyalprazolam        404          EXPECTED   ng/mg creat    Source of alprazolam is a scheduled prescription medication. Alpha-    hydroxyalprazolam is an expected metabolite of alprazolam.    Hydrocodone                    1313         EXPECTED   ng/mg creat   Hydromorphone                  105          EXPECTED   ng/mg creat   Dihydrocodeine                 158          EXPECTED   ng/mg creat   Norhydrocodone                 1237         EXPECTED   ng/mg creat    Sources of hydrocodone include scheduled prescription medications.    Hydromorphone, dihydrocodeine and norhydrocodone are expected    metabolites of hydrocodone. Hydromorphone and dihydrocodeine are    also available as scheduled prescription medications.  ==================================================================== Test                      Result    Flag   Units      Ref Range   Creatinine              184              mg/dL      >=20 ==================================================================== Declared  Medications:  The flagging and interpretation on this report are based on the  following declared medications.  Unexpected results may arise from  inaccuracies in the declared medications.   **Note: The testing scope of this panel includes these medications:   Alprazolam (Xanax)  Hydrocodone (Norco)   **Note: The testing scope of this panel does not include the  following reported medications:   Acetaminophen (Norco)  Albuterol (Ventolin HFA)  Atorvastatin (Lipitor)  Buspirone (Buspar)  Colchicine  Levothyroxine (Synthroid)  Lisinopril (Zestril)  Nabumetone (Relafen)  Nitroglycerin  Omeprazole (Prilosec)  Pantoprazole (Protonix)  Pregabalin (Lyrica)  Sertraline (Zoloft)  Sildenafil  Testosterone  Tizanidine (Zanaflex) ==================================================================== For clinical consultation, please call [phone]. ====================================================================      Laboratory Chemistry Profile   Renal Lab Results  Component Value Date   BUN 15  10/12/2020   CREATININE 0.85 10/12/2020   BCR 8 (L) 06/03/2018   GFR 104.18 10/12/2020   GFRAA >60 10/05/2018   GFRNONAA >60 10/05/2018    Hepatic Lab Results  Component Value Date   AST 19 10/12/2020   ALT 24 10/12/2020   ALBUMIN 4.3 10/12/2020   ALKPHOS 59 10/12/2020   HCVAB (A) 09/29/2009    Reactive (NOTE) Result repeated and verified.                                                                       This test is for screening purposes only.  Reactive results should be confirmed by an alternative method.  Suggest HCV Qualitative, PCR, test code  83130.  Specimens will be stable for reflex testing up to 3 days after collection.   LIPASE 26 10/05/2018    Electrolytes Lab Results  Component Value Date   NA 140 10/12/2020   K 3.9 10/12/2020   CL 102 10/12/2020   CALCIUM 9.4 10/12/2020   MG 2.1 06/03/2018    Bone Lab Results  Component Value Date   VD25OH  21.01 (L) 02/17/2018   TESTOFREE See below 02/17/2018   TESTOSTERONE 147 (L) 02/17/2018    Inflammation (CRP: Acute Phase) (ESR: Chronic Phase) Lab Results  Component Value Date   CRP 1 06/03/2018   ESRSEDRATE 21 (H) 06/03/2018         Note: Above Lab results reviewed.  Imaging  MR LUMBAR SPINE WO CONTRAST CLINICAL DATA:  Low back pain which is worse on the right and radiates down the right leg  EXAM: MRI LUMBAR SPINE WITHOUT CONTRAST  TECHNIQUE: Multiplanar, multisequence MR imaging of the lumbar spine was performed. No intravenous contrast was administered.  COMPARISON:  04/24/2018  FINDINGS: Segmentation:  Standard.  Alignment:  Physiologic.  Vertebrae:  No fracture, evidence of discitis, or bone lesion.  Conus medullaris and cauda equina: Conus extends to the L1 level. Conus and cauda equina appear normal.  Paraspinal and other soft tissues: No evidence of perispinal mass or inflammation.  Disc levels:  T12- L1: Unremarkable.  L1-L2: Unremarkable.  L2-L3: Disc narrowing and bulging with ventral spurring. Mild facet spurring.  L3-L4: Disc narrowing and bulging with ventral spurring. Right paracentral downward migrating extrusion which touches the descending right L4 nerve root. Patent foramina  L4-L5: Unremarkable.  L5-S1:Unremarkable.  IMPRESSION: Since 2020, L3-4 right paracentral extrusion which is downward migrating and contacts the right L4 nerve root.  Background mild disc degeneration at L2-3 and L3-4.  Electronically Signed   By: Monte Fantasia M.D.   On: 09/28/2020 20:40  Assessment  The primary encounter diagnosis was Chronic pain syndrome. Diagnoses of Subacute right lumbar radiculopathy, Chronic low back pain (1ry area of Pain) (Bilateral) (R=L) w/o sciatica, Herniated nucleus pulposus, L3-4 right, Failed back surgical syndrome, DDD (degenerative disc disease), lumbosacral, Cervicalgia, DDD (degenerative disc disease), cervical,  Chronic neck pain with history of cervical spinal surgery, Status post lumbar spine surgery for decompression of spinal cord, Pharmacologic therapy, Chronic use of opiate for therapeutic purpose, and Encounter for medication management were also pertinent to this visit.  Plan of Care  Problem-specific:  No problem-specific Assessment & Plan notes found for this encounter.  Mr. Chad E Yarbro  has a current medication list which includes the following long-term medication(s): albuterol, atorvastatin, colchicine, depo-testosterone, fenofibrate, [START ON 11/22/2020] hydrocodone-acetaminophen, levothyroxine, lisinopril, nitroglycerin, pantoprazole, pregabalin, sertraline, testosterone cypionate, testosterone cypionate, testosterone cypionate, and [DISCONTINUED] omeprazole.  Pharmacotherapy (Medications Ordered): Meds ordered this encounter  Medications   HYDROcodone-acetaminophen (NORCO/VICODIN) 5-325 MG tablet    Sig: Take 1 tablet by mouth 3 (three) times daily as needed for severe pain. Must last 30 days    Dispense:  90 tablet    Refill:  0    Not a duplicate. Do NOT delete! Dispense 1 day early if closed on refill date. Avoid benzodiazepines within 8 hours of opioids. Do not send refill requests.    Orders:  Orders Placed This Encounter  Procedures   Lumbar Epidural Injection    Standing Status:   Future    Standing Expiration Date:   12/15/2020    Scheduling Instructions:     Procedure: Interlaminar Lumbar Epidural Steroid injection (LESI)  L4-5     Laterality: Right-sided     Sedation: Patient's choice.     Timeframe: ASAA    Order Specific Question:   Where will this procedure be performed?    Answer:   [facility]   Lumbar Transforaminal Epidural    Standing Status:   Future    Standing Expiration Date:   12/15/2020    Scheduling Instructions:     Side: Right-sided     Level: L3, L4     Sedation: Patient's choice.     Timeframe: ASAP    Order Specific Question:   Where  will this procedure be performed?    Answer:   [facility]    Follow-up plan:   Return for (Clinic) procedure:, (Sed) (R) L3 & L4 TFESI vs (R) L4-5 LESI #1 + (MM).     Interventional Therapies  Risk  Complexity Considerations:   WNL   Planned  Pending:   (08/21/2020) referral to orthopedics to evaluate right hip superior labral tear.   Under consideration:      Completed:   Therapeutic bilateral lumbar facet MBB x3  (09/16/2019) (100/100/50/90-100)  Therapeutic right lumbar facet RFA x2 (01/18/2020) (100/100/100/100)  Therapeutic left lumbar facet RFA x2 (02/24/2020) (100/100/50/90-100)  Diagnostic right IA hip injection x1 (06/22/2020) (75/75/75)  Diagnostic right gluteal femoral and greater trochanteric bursa injection x1 (06/22/2020) (75/25/25)    Therapeutic  Palliative (PRN) options:   Palliative bilateral lumbar facet MBB #4  Palliative right lumbar facet RFA #3  Palliative left lumbar facet RFA #3      Recent Visits Date Type Provider Dept  08/21/20 Office Visit Milinda Pointer, MD [facility]  Showing recent visits within past 90 days and meeting all other requirements Today's Visits Date Type Provider Dept  11/15/20 Telemedicine Milinda Pointer, MD [facility]  Showing today's visits and meeting all other requirements Future Appointments Date Type Provider Dept  12/18/20 Appointment Milinda Pointer, MD [facility]  Showing future appointments within next 90 days and meeting all other requirements I discussed the assessment and treatment plan with the patient. The patient was provided an opportunity to ask questions and all were answered. The patient agreed with the plan and demonstrated an understanding of the instructions.  Patient advised to call back or seek an in-person evaluation if the symptoms or condition worsens.  Duration of encounter: 16 minutes.  Note by: [name], MD Date: 11/15/2020; Time: 2:58  PM

## 2020-11-14 ENCOUNTER — Encounter: Payer: Self-pay | Admitting: Pain Medicine

## 2020-11-14 ENCOUNTER — Telehealth: Payer: Self-pay

## 2020-11-14 NOTE — Telephone Encounter (Signed)
LM for patient to call back  for pre virtual appointment questions.

## 2020-11-15 ENCOUNTER — Ambulatory Visit: Payer: 59 | Attending: Pain Medicine | Admitting: Pain Medicine

## 2020-11-15 ENCOUNTER — Other Ambulatory Visit (HOSPITAL_COMMUNITY): Payer: Self-pay

## 2020-11-15 ENCOUNTER — Telehealth: Payer: Self-pay

## 2020-11-15 ENCOUNTER — Other Ambulatory Visit: Payer: Self-pay

## 2020-11-15 DIAGNOSIS — Z79899 Other long term (current) drug therapy: Secondary | ICD-10-CM

## 2020-11-15 DIAGNOSIS — M5030 Other cervical disc degeneration, unspecified cervical region: Secondary | ICD-10-CM | POA: Diagnosis not present

## 2020-11-15 DIAGNOSIS — Z79891 Long term (current) use of opiate analgesic: Secondary | ICD-10-CM

## 2020-11-15 DIAGNOSIS — M5126 Other intervertebral disc displacement, lumbar region: Secondary | ICD-10-CM

## 2020-11-15 DIAGNOSIS — G8928 Other chronic postprocedural pain: Secondary | ICD-10-CM

## 2020-11-15 DIAGNOSIS — M545 Low back pain, unspecified: Secondary | ICD-10-CM | POA: Diagnosis not present

## 2020-11-15 DIAGNOSIS — G8929 Other chronic pain: Secondary | ICD-10-CM

## 2020-11-15 DIAGNOSIS — Z9889 Other specified postprocedural states: Secondary | ICD-10-CM

## 2020-11-15 DIAGNOSIS — M542 Cervicalgia: Secondary | ICD-10-CM

## 2020-11-15 DIAGNOSIS — G894 Chronic pain syndrome: Secondary | ICD-10-CM | POA: Diagnosis not present

## 2020-11-15 DIAGNOSIS — M5416 Radiculopathy, lumbar region: Secondary | ICD-10-CM | POA: Diagnosis not present

## 2020-11-15 DIAGNOSIS — M961 Postlaminectomy syndrome, not elsewhere classified: Secondary | ICD-10-CM

## 2020-11-15 DIAGNOSIS — M5137 Other intervertebral disc degeneration, lumbosacral region: Secondary | ICD-10-CM | POA: Diagnosis not present

## 2020-11-15 MED ORDER — HYDROCODONE-ACETAMINOPHEN 5-325 MG PO TABS
1.0000 | ORAL_TABLET | Freq: Three times a day (TID) | ORAL | 0 refills | Status: DC | PRN
  Filled 2020-11-22: qty 90, 30d supply, fill #0

## 2020-11-15 NOTE — Patient Instructions (Signed)
______________________________________________________________________  Preparing for Procedure with Sedation  NOTICE: Due to recent regulatory changes, starting on October 16, 2020, procedures requiring intravenous (IV) sedation will no longer be performed at the [facility].  These types of procedures are required to be performed at [facility] ambulatory surgery facility.  We are very sorry for the inconvenience.  Procedure appointments are limited to planned procedures: No Prescription Refills. No disability issues will be discussed. No medication changes will be discussed.  Instructions: Oral Intake: Do not eat or drink anything for at least 8 hours prior to your procedure. (Exception: Blood Pressure Medication. See below.) Transportation: A driver is required. You may not drive yourself after the procedure. Blood Pressure Medicine: Do not forget to take your blood pressure medicine with a sip of water the morning of the procedure. If your Diastolic (lower reading) is above 100 mmHg, elective cases will be cancelled/rescheduled. Blood thinners: These will need to be stopped for procedures. Notify our staff if you are taking any blood thinners. Depending on which one you take, there will be specific instructions on how and when to stop it. Diabetics on insulin: Notify the staff so that you can be scheduled 1st case in the morning. If your diabetes requires high dose insulin, take only  of your normal insulin dose the morning of the procedure and notify the staff that you have done so. Preventing infections: Shower with an antibacterial soap the morning of your procedure. Build-up your immune system: Take 1000 mg of Vitamin C with every meal (3 times a day) the day prior to your procedure. Antibiotics: Inform the staff if you have a condition or reason that requires you to take antibiotics before dental procedures. Pregnancy: If you are pregnant, call and cancel the procedure. Sickness: If  you have a cold, fever, or any active infections, call and cancel the procedure. Arrival: You must be in the facility at least 30 minutes prior to your scheduled procedure. Children: Do not bring children with you. Dress appropriately: Bring dark clothing that you would not mind if they get stained. Valuables: Do not bring any jewelry or valuables.  Reasons to call and reschedule or cancel your procedure: (Following these recommendations will minimize the risk of a serious complication.) Surgeries: Avoid having procedures within 2 weeks of any surgery. (Avoid for 2 weeks before or after any surgery). Flu Shots: Avoid having procedures within 2 weeks of a flu shots. (Avoid for 2 weeks before or after immunizations). Barium: Avoid having a procedure within 7-10 days after having had a radiological study involving the use of radiological contrast. (Myelograms, Barium swallow or enema study). Heart attacks: Avoid any elective procedures or surgeries for the initial 6 months after a "Myocardial Infarction" (Heart Attack). Blood thinners: It is imperative that you stop these medications before procedures. Let us know if you if you take any blood thinner.  Infection: Avoid procedures during or within two weeks of an infection (including chest colds or gastrointestinal problems). Symptoms associated with infections include: Localized redness, fever, chills, night sweats or profuse sweating, burning sensation when voiding, cough, congestion, stuffiness, runny nose, sore throat, diarrhea, nausea, vomiting, cold or Flu symptoms, recent or current infections. It is specially important if the infection is over the area that we intend to treat. Heart and lung problems: Symptoms that may suggest an active cardiopulmonary problem include: cough, chest pain, breathing difficulties or shortness of breath, dizziness, ankle swelling, uncontrolled high or unusually low blood pressure, and/or palpitations. If you are  experiencing any of these symptoms, cancel your procedure and contact your primary care physician for an evaluation.  Remember:  Regular Business hours are:  Monday to Thursday 8:00 AM to 4:00 PM  Provider's Schedule: Milinda Pointer, MD:  Procedure days: Tuesday and Thursday 7:30 AM to 4:00 PM  Gillis Santa, MD:  Procedure days: Monday and Wednesday 7:30 AM to 4:00 PM ______________________________________________________________________  ____________________________________________________________________________________________  General Risks and Possible Complications  Patient Responsibilities: It is important that you read this as it is part of your informed consent. It is our duty to inform you of the risks and possible complications associated with treatments offered to you. It is your responsibility as a patient to read this and to ask questions about anything that is not clear or that you believe was not covered in this document.  Patient's Rights: You have the right to refuse treatment. You also have the right to change your mind, even after initially having agreed to have the treatment done. However, under this last option, if you wait until the last second to change your mind, you may be charged for the materials used up to that point.  Introduction: Medicine is not an Chief Strategy Officer. Everything in Medicine, including the lack of treatment(s), carries the potential for danger, harm, or loss (which is by definition: Risk). In Medicine, a complication is a secondary problem, condition, or disease that can aggravate an already existing one. All treatments carry the risk of possible complications. The fact that a side effects or complications occurs, does not imply that the treatment was conducted incorrectly. It must be clearly understood that these can happen even when everything is done following the highest safety standards.  No treatment: You can choose not to proceed with the  proposed treatment alternative. The "PRO(s)" would include: avoiding the risk of complications associated with the therapy. The "CON(s)" would include: not getting any of the treatment benefits. These benefits fall under one of three categories: diagnostic; therapeutic; and/or palliative. Diagnostic benefits include: getting information which can ultimately lead to improvement of the disease or symptom(s). Therapeutic benefits are those associated with the successful treatment of the disease. Finally, palliative benefits are those related to the decrease of the primary symptoms, without necessarily curing the condition (example: decreasing the pain from a flare-up of a chronic condition, such as incurable terminal cancer).  General Risks and Complications: These are associated to most interventional treatments. They can occur alone, or in combination. They fall under one of the following six (6) categories: no benefit or worsening of symptoms; bleeding; infection; nerve damage; allergic reactions; and/or death. No benefits or worsening of symptoms: In Medicine there are no guarantees, only probabilities. No healthcare provider can ever guarantee that a medical treatment will work, they can only state the probability that it may. Furthermore, there is always the possibility that the condition may worsen, either directly, or indirectly, as a consequence of the treatment. Bleeding: This is more common if the patient is taking a blood thinner, either prescription or over the counter (example: Goody Powders, Fish oil, Aspirin, Garlic, etc.), or if suffering a condition associated with impaired coagulation (example: Hemophilia, cirrhosis of the liver, low platelet counts, etc.). However, even if you do not have one on these, it can still happen. If you have any of these conditions, or take one of these drugs, make sure to notify your treating physician. Infection: This is more common in patients with a compromised  immune system, either due to disease (example:  diabetes, cancer, human immunodeficiency virus [HIV], etc.), or due to medications or treatments (example: therapies used to treat cancer and rheumatological diseases). However, even if you do not have one on these, it can still happen. If you have any of these conditions, or take one of these drugs, make sure to notify your treating physician. Nerve Damage: This is more common when the treatment is an invasive one, but it can also happen with the use of medications, such as those used in the treatment of cancer. The damage can occur to small secondary nerves, or to large primary ones, such as those in the spinal cord and brain. This damage may be temporary or permanent and it may lead to impairments that can range from temporary numbness to permanent paralysis and/or brain death. Allergic Reactions: Any time a substance or material comes in contact with our body, there is the possibility of an allergic reaction. These can range from a mild skin rash (contact dermatitis) to a severe systemic reaction (anaphylactic reaction), which can result in death. Death: In general, any medical intervention can result in death, most of the time due to an unforeseen complication. ____________________________________________________________________________________________

## 2020-11-15 NOTE — Telephone Encounter (Signed)
LM for pre virtual appointment questions.

## 2020-11-16 NOTE — Telephone Encounter (Signed)
Pre procedure instructions given with sedation.  No blood thinners.  Ok to schedule.

## 2020-11-21 ENCOUNTER — Other Ambulatory Visit (HOSPITAL_COMMUNITY): Payer: Self-pay

## 2020-11-22 ENCOUNTER — Encounter: Payer: 59 | Admitting: Pain Medicine

## 2020-11-22 ENCOUNTER — Other Ambulatory Visit (HOSPITAL_COMMUNITY): Payer: Self-pay

## 2020-11-24 ENCOUNTER — Ambulatory Visit: Payer: 59 | Admitting: Endocrinology

## 2020-11-27 ENCOUNTER — Other Ambulatory Visit (HOSPITAL_COMMUNITY): Payer: Self-pay

## 2020-11-29 ENCOUNTER — Other Ambulatory Visit (HOSPITAL_COMMUNITY): Payer: Self-pay

## 2020-11-29 MED FILL — Pantoprazole Sodium EC Tab 40 MG (Base Equiv): ORAL | 30 days supply | Qty: 30 | Fill #4 | Status: AC

## 2020-11-29 MED FILL — Sertraline HCl Tab 100 MG: ORAL | 90 days supply | Qty: 180 | Fill #1 | Status: AC

## 2020-12-04 ENCOUNTER — Other Ambulatory Visit: Payer: Self-pay | Admitting: Adult Health

## 2020-12-04 ENCOUNTER — Other Ambulatory Visit (HOSPITAL_COMMUNITY): Payer: Self-pay

## 2020-12-05 ENCOUNTER — Other Ambulatory Visit (HOSPITAL_COMMUNITY): Payer: Self-pay

## 2020-12-05 MED ORDER — ALPRAZOLAM 0.5 MG PO TABS
0.5000 mg | ORAL_TABLET | Freq: Two times a day (BID) | ORAL | 2 refills | Status: DC
  Filled 2020-12-05: qty 60, 30d supply, fill #0
  Filled 2021-01-14: qty 60, 30d supply, fill #1
  Filled 2021-03-06: qty 60, 30d supply, fill #2

## 2020-12-05 NOTE — Telephone Encounter (Signed)
Okay for refill?    LOV  08/10/2020   Last Refill   08/10/2020  60QTY. 2 Refills

## 2020-12-06 ENCOUNTER — Other Ambulatory Visit (HOSPITAL_COMMUNITY): Payer: Self-pay

## 2020-12-12 ENCOUNTER — Other Ambulatory Visit (HOSPITAL_COMMUNITY): Payer: Self-pay

## 2020-12-12 MED FILL — Sildenafil Citrate Tab 20 MG: ORAL | 18 days supply | Qty: 90 | Fill #1 | Status: AC

## 2020-12-13 ENCOUNTER — Other Ambulatory Visit (HOSPITAL_COMMUNITY): Payer: Self-pay

## 2020-12-15 NOTE — Progress Notes (Deleted)
No show to appointment.

## 2020-12-18 ENCOUNTER — Encounter: Payer: 59 | Admitting: Pain Medicine

## 2020-12-18 DIAGNOSIS — M961 Postlaminectomy syndrome, not elsewhere classified: Secondary | ICD-10-CM

## 2020-12-18 DIAGNOSIS — M7918 Myalgia, other site: Secondary | ICD-10-CM

## 2020-12-18 DIAGNOSIS — G8928 Other chronic postprocedural pain: Secondary | ICD-10-CM

## 2020-12-18 DIAGNOSIS — Z79899 Other long term (current) drug therapy: Secondary | ICD-10-CM

## 2020-12-18 DIAGNOSIS — G8929 Other chronic pain: Secondary | ICD-10-CM

## 2020-12-18 DIAGNOSIS — Z79891 Long term (current) use of opiate analgesic: Secondary | ICD-10-CM

## 2020-12-18 DIAGNOSIS — G894 Chronic pain syndrome: Secondary | ICD-10-CM

## 2020-12-20 NOTE — Progress Notes (Signed)
PROVIDER NOTE: Information contained herein reflects review and annotations entered in association with encounter. Interpretation of such information and data should be left to medically-trained personnel. Information provided to patient can be located elsewhere in the medical record under "Patient Instructions". Document created using STT-dictation technology, any transcriptional errors that may result from process are unintentional.    Patient: Chad Wilson  Service Category: Procedure  Provider: [name], MD  DOB: 1975-03-29  DOS: 12/21/2020  Location: [facility]  MRN: [id number]  Setting: Ambulatory - outpatient  Referring Provider: Dorothyann Peng, NP  Type: Established Patient  Specialty: Interventional Pain Management  PCP: Dorothyann Peng, NP   Primary Reason for Visit: Interventional Pain Management Treatment. CC: Back Pain (lower)    Procedure:          Anesthesia, Analgesia, Anxiolysis:  Type: Trans-Foraminal Epidural Steroid Injection          Purpose: Diagnostic/Therapeutic Region: Posterolateral Lumbosacral Target Area: The 6 o'clock position under the pedicle, on the affected side. Approach: Posterior Percutaneous Paravertebral approach. Level: L3 & L4 Level Laterality: Right       Type: Local Anesthesia Local Anesthetic: Lidocaine 1-2% Sedation: Minimal Anxiolysis  Indication(s): Anxiety & Analgesia Route: Infiltration (Luna/IM) IV Access: Available   Position: Prone   Indications: 1. DDD (degenerative disc disease), lumbosacral   2. Failed back surgical syndrome   3. Grade 1 Lumbosacral Retrolisthesis of L5/S1   4. Herniated nucleus pulposus, L3-4 right   5. Lumbar spondylosis   6. Subacute right lumbar radiculopathy   7. Encounter for therapeutic procedure    Pain Score: Pre-procedure: 5 /10 Post-procedure: 0-No pain/10     RTCB: 03/22/2021  Pharmacotherapy Assessment  Analgesic: Hydrocodone/APAP 5/325, 1 tab p.o. every 8 hours (15  mg/day of hydrocodone) MME/day: 15 mg/day.   Monitoring: [place] PMP: PDMP reviewed during this encounter.       Pharmacotherapy: No side-effects or adverse reactions reported. Compliance: No problems identified. Effectiveness: Clinically acceptable.  UDS:  Summary  Date Value Ref Range Status  08/21/2020 Note  Final    Comment:    ==================================================================== [employer] Select 13 (MW) ==================================================================== Test                             Result       Flag       Units  Drug Present and Declared for Prescription Verification   Alprazolam                     142          EXPECTED   ng/mg creat   Alpha-hydroxyalprazolam        404          EXPECTED   ng/mg creat    Source of alprazolam is a scheduled prescription medication. Alpha-    hydroxyalprazolam is an expected metabolite of alprazolam.    Hydrocodone                    1313         EXPECTED   ng/mg creat   Hydromorphone                  105          EXPECTED   ng/mg creat   Dihydrocodeine                 158  EXPECTED   ng/mg creat   Norhydrocodone                 1237         EXPECTED   ng/mg creat    Sources of hydrocodone include scheduled prescription medications.    Hydromorphone, dihydrocodeine and norhydrocodone are expected    metabolites of hydrocodone. Hydromorphone and dihydrocodeine are    also available as scheduled prescription medications.  ==================================================================== Test                      Result    Flag   Units      Ref Range   Creatinine              184              mg/dL      >=20 ==================================================================== Declared Medications:  The flagging and interpretation on this report are based on the  following declared medications.  Unexpected results may arise from  inaccuracies in the declared medications.   **Note: The testing scope  of this panel includes these medications:   Alprazolam (Xanax)  Hydrocodone (Norco)   **Note: The testing scope of this panel does not include the  following reported medications:   Acetaminophen (Norco)  Albuterol (Ventolin HFA)  Atorvastatin (Lipitor)  Buspirone (Buspar)  Colchicine  Levothyroxine (Synthroid)  Lisinopril (Zestril)  Nabumetone (Relafen)  Nitroglycerin  Omeprazole (Prilosec)  Pantoprazole (Protonix)  Pregabalin (Lyrica)  Sertraline (Zoloft)  Sildenafil  Testosterone  Tizanidine (Zanaflex) ==================================================================== For clinical consultation, please call [phone]. ====================================================================      Pre-op H&P Assessment:  Chad Wilson is a 45 y.o. (year old), male patient, seen today for interventional treatment. He  has a past surgical history that includes Multiple tooth extractions (15 yrs. ago); Anterior cervical decomp/discectomy fusion (N/A, 05/21/2017); and Wisdom tooth extraction. Mr. Catoe has a current medication list which includes the following prescription(s): albuterol, alprazolam, atorvastatin, buspirone, colchicine, carestart covid-19 home test, depo-testosterone, fenofibrate, [START ON 12/22/2020] hydrocodone-acetaminophen, [START ON 01/21/2021] hydrocodone-acetaminophen, [START ON 02/20/2021] hydrocodone-acetaminophen, levothyroxine, lisinopril, nabumetone, nitroglycerin, pantoprazole, pregabalin, sertraline, sildenafil, testosterone cypionate, testosterone cypionate, tizanidine, testosterone cypionate, and [DISCONTINUED] omeprazole. His primarily concern today is the Back Pain (lower)  Initial Vital Signs:  Pulse/HCG Rate: 68ECG Heart Rate: 71 Temp: (!) 97.1 F (36.2 C) Resp: 16 BP: (!) 148/84 SpO2: 97 %  BMI: Estimated body mass index is 32.28 kg/m as calculated from the following:   Height as of this encounter: 5' 6"  (1.676 m).   Weight as of this encounter:  200 lb (90.7 kg).  Risk Assessment: Allergies: Reviewed. He is allergic to chantix [varenicline].  Allergy Precautions: None required Coagulopathies: Reviewed. None identified.  Blood-thinner therapy: None at this time Active Infection(s): Reviewed. None identified. Mr. Ramires is afebrile  Site Confirmation: Mr. Orrico was asked to confirm the procedure and laterality before marking the site Procedure checklist: Completed Consent: Before the procedure and under the influence of no sedative(s), amnesic(s), or anxiolytics, the patient was informed of the treatment options, risks and possible complications. To fulfill our ethical and legal obligations, as recommended by the American Medical Association's Code of Ethics, I have informed the patient of my clinical impression; the nature and purpose of the treatment or procedure; the risks, benefits, and possible complications of the intervention; the alternatives, including doing nothing; the risk(s) and benefit(s) of the alternative treatment(s) or procedure(s); and the risk(s) and benefit(s) of doing nothing. The patient was  provided information about the general risks and possible complications associated with the procedure. These may include, but are not limited to: failure to achieve desired goals, infection, bleeding, organ or nerve damage, allergic reactions, paralysis, and death. In addition, the patient was informed of those risks and complications associated to Spine-related procedures, such as failure to decrease pain; infection (i.e.: Meningitis, epidural or intraspinal abscess); bleeding (i.e.: epidural hematoma, subarachnoid hemorrhage, or any other type of intraspinal or peri-dural bleeding); organ or nerve damage (i.e.: Any type of peripheral nerve, nerve root, or spinal cord injury) with subsequent damage to sensory, motor, and/or autonomic systems, resulting in permanent pain, numbness, and/or weakness of one or several areas of the body;  allergic reactions; (i.e.: anaphylactic reaction); and/or death. Furthermore, the patient was informed of those risks and complications associated with the medications. These include, but are not limited to: allergic reactions (i.e.: anaphylactic or anaphylactoid reaction(s)); adrenal axis suppression; blood sugar elevation that in diabetics may result in ketoacidosis or comma; water retention that in patients with history of congestive heart failure may result in shortness of breath, pulmonary edema, and decompensation with resultant heart failure; weight gain; swelling or edema; medication-induced neural toxicity; particulate matter embolism and blood vessel occlusion with resultant organ, and/or nervous system infarction; and/or aseptic necrosis of one or more joints. Finally, the patient was informed that Medicine is not an exact science; therefore, there is also the possibility of unforeseen or unpredictable risks and/or possible complications that may result in a catastrophic outcome. The patient indicated having understood very clearly. We have given the patient no guarantees and we have made no promises. Enough time was given to the patient to ask questions, all of which were answered to the patient's satisfaction. Mr. Candelas has indicated that he wanted to continue with the procedure. Attestation: I, the ordering provider, attest that I have discussed with the patient the benefits, risks, side-effects, alternatives, likelihood of achieving goals, and potential problems during recovery for the procedure that I have provided informed consent. Date  Time: 12/21/2020  8:15 AM  Pre-Procedure Preparation:  Monitoring: As per clinic protocol. Respiration, ETCO2, SpO2, BP, heart rate and rhythm monitor placed and checked for adequate function Safety Precautions: Patient was assessed for positional comfort and pressure points before starting the procedure. Time-out: I initiated and conducted the "Time-out"  before starting the procedure, as per protocol. The patient was asked to participate by confirming the accuracy of the "Time Out" information. Verification of the correct person, site, and procedure were performed and confirmed by me, the nursing staff, and the patient. "Time-out" conducted as per Joint Commission's Universal Protocol (UP.01.01.01). Time: 0900  Description of Procedure:          Area Prepped: Entire Posterior Lumbosacral Area DuraPrep (Iodine Povacrylex [0.7% available iodine] and Isopropyl Alcohol, 74% w/w) Safety Precautions: Aspiration looking for blood return was conducted prior to all injections. At no point did we inject any substances, as a needle was being advanced. No attempts were made at seeking any paresthesias. Safe injection practices and needle disposal techniques used. Medications properly checked for expiration dates. SDV (single dose vial) medications used. Description of the Procedure: Protocol guidelines were followed. The patient was placed in position over the procedure table. The target area was identified and the area prepped in the usual manner. Skin & deeper tissues infiltrated with local anesthetic. Appropriate amount of time allowed to pass for local anesthetics to take effect. The procedure needles were then advanced to the target area.  Proper needle placement secured. Negative aspiration confirmed. Solution injected in intermittent fashion, asking for systemic symptoms every 0.5cc of injectate. The needles were then removed and the area cleansed, making sure to leave some of the prepping solution back to take advantage of its long term bactericidal properties.  Vitals:   12/21/20 0907 12/21/20 0910 12/21/20 0920 12/21/20 0930  BP: 135/78 134/79 130/79 130/73  Pulse:  64 70 62  Resp: 14 16 16 15   Temp:  (!) 97.2 F (36.2 C)  (!) 97.1 F (36.2 C)  TempSrc:      SpO2: 94% 99% 99% 99%  Weight:      Height:        Start Time: 0900 hrs. End Time: 0907  hrs.  Materials:  Needle(s) Type: Spinal Needle Gauge: 22G Length: 3.5-in Medication(s): Please see orders for medications and dosing details.  Imaging Guidance (Spinal):          Type of Imaging Technique: Fluoroscopy Guidance (Spinal) Indication(s): Assistance in needle guidance and placement for procedures requiring needle placement in or near specific anatomical locations not easily accessible without such assistance. Exposure Time: Please see nurses notes. Contrast: Before injecting any contrast, we confirmed that the patient did not have an allergy to iodine, shellfish, or radiological contrast. Once satisfactory needle placement was completed at the desired level, radiological contrast was injected. Contrast injected under live fluoroscopy. No contrast complications. See chart for type and volume of contrast used. Fluoroscopic Guidance: I was personally present during the use of fluoroscopy. "Tunnel Vision Technique" used to obtain the best possible view of the target area. Parallax error corrected before commencing the procedure. "Direction-depth-direction" technique used to introduce the needle under continuous pulsed fluoroscopy. Once target was reached, antero-posterior, oblique, and lateral fluoroscopic projection used confirm needle placement in all planes. Images permanently stored in EMR. Interpretation: I personally interpreted the imaging intraoperatively. Adequate needle placement confirmed in multiple planes. Appropriate spread of contrast into desired area was observed. No evidence of afferent or efferent intravascular uptake. No intrathecal or subarachnoid spread observed. Permanent images saved into the patient's record.  Antibiotic Prophylaxis:   Anti-infectives (From admission, onward)    None      Indication(s): None identified  Post-operative Assessment:  Post-procedure Vital Signs:  Pulse/HCG Rate: 6269 Temp: (!) 97.1 F (36.2 C) Resp: 15 BP: 130/73 SpO2:  99 %  EBL: None  Complications: No immediate post-treatment complications observed by team, or reported by patient.  Note: The patient tolerated the entire procedure well. A repeat set of vitals were taken after the procedure and the patient was kept under observation following institutional policy, for this type of procedure. Post-procedural neurological assessment was performed, showing return to baseline, prior to discharge. The patient was provided with post-procedure discharge instructions, including a section on how to identify potential problems. Should any problems arise concerning this procedure, the patient was given instructions to immediately contact us, at any time, without hesitation. In any case, we plan to contact the patient by telephone for a follow-up status report regarding this interventional procedure.  Comments:  No additional relevant information.  Plan of Care  Orders:  Orders Placed This Encounter  Procedures   Lumbar Transforaminal Epidural    Scheduling Instructions:     Side: Right-sided     Level: L3 & L4     Sedation: Patient's choice.     Timeframe: Today    Order Specific Question:   Where will this procedure be performed?    Answer:  [facility]   DG PAIN [facility] C-ARM 1-60 MIN NO REPORT    Intraoperative interpretation by procedural physician at [place].    Standing Status:   Standing    Number of Occurrences:   1    Order Specific Question:   Reason for exam:    Answer:   Assistance in needle guidance and placement for procedures requiring needle placement in or near specific anatomical locations not easily accessible without such assistance.   Informed Consent Details: Physician/Practitioner Attestation; Transcribe to consent form and obtain patient signature    Provider Attestation: I, [place] Dossie Arbour, MD, (Pain Management Specialist), the physician/practitioner, attest that I have discussed with the patient the benefits,  risks, side effects, alternatives, likelihood of achieving goals and potential problems during recovery for the procedure that I have provided informed consent.    Scheduling Instructions:     Note: Always confirm laterality of pain with Mr. Mohrmann, before procedure.     Transcribe to consent form and obtain patient signature.    Order Specific Question:   Physician/Practitioner attestation of informed consent for procedure/surgical case    Answer:   I, the physician/practitioner, attest that I have discussed with the patient the benefits, risks, side effects, alternatives, likelihood of achieving goals and potential problems during recovery for the procedure that I have provided informed consent.    Order Specific Question:   Procedure    Answer:   Diagnostic lumbar transforaminal epidural steroid injection under fluoroscopic guidance. (See notes for level and laterality.)    Order Specific Question:   Physician/Practitioner performing the procedure    Answer:   Kalvin Buss A. 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 every 8 hours (15 mg/day of hydrocodone) MME/day: 15 mg/day.   Medications ordered for procedure: Meds ordered this encounter  Medications   HYDROcodone-acetaminophen (NORCO/VICODIN) 5-325 MG tablet    Sig: Take 1 tablet by mouth 3 (three) times daily as needed for severe pain. Must last 30 days    Dispense:  90 tablet    Refill:  0    Not a duplicate. Do NOT delete! Dispense 1 day early if closed on fill date. Warn: no  CNS-depressants within 8 hrs of opioid. Do not send refill request. Renewal requires appointment. No partial fills allowed   HYDROcodone-acetaminophen (NORCO/VICODIN) 5-325 MG tablet    Sig: Take 1 tablet by mouth 3 (three) times daily as needed for severe pain. Must last 30 days    Dispense:  90 tablet    Refill:  0    Not a duplicate. Do NOT delete! Dispense 1 day early if closed on fill date. Warn: no CNS-depressants within 8 hrs of opioid. Do not send refill request. Renewal requires appointment. No partial fills allowed   HYDROcodone-acetaminophen (NORCO/VICODIN) 5-325 MG tablet    Sig: Take 1 tablet by mouth 3 (three) times daily as needed for severe pain. Must last 30 days    Dispense:  90 tablet  Refill:  0    Not a duplicate. Do NOT delete! Dispense 1 day early if closed on fill date. Warn: no CNS-depressants within 8 hrs of opioid. Do not send refill request. Renewal requires appointment. No partial fills allowed   iohexol (OMNIPAQUE) 180 MG/ML injection 10 mL    Must be Myelogram-compatible. If not available, you may substitute with a water-soluble, non-ionic, hypoallergenic, myelogram-compatible radiological contrast medium.   lidocaine (XYLOCAINE) 2 % (with pres) injection 400 mg   lactated ringers infusion 1,000 mL   midazolam (VERSED) 5 MG/5ML injection 0.5-2 mg    Make sure Flumazenil is available in the pyxis when using this medication. If oversedation occurs, administer 0.2 mg IV over 15 sec. If after 45 sec no response, administer 0.2 mg again over 1 min; may repeat at 1 min intervals; not to exceed 4 doses (1 mg)   sodium chloride flush (NS) 0.9 % injection 2 mL   ropivacaine (PF) 2 mg/mL (0.2%) (NAROPIN) injection 2 mL   dexamethasone (DECADRON) injection 20 mg    Medications administered: We administered iohexol, lidocaine, lactated ringers, midazolam, sodium chloride flush, ropivacaine (PF) 2 mg/mL (0.2%), and dexamethasone.  See the medical record for exact dosing,  route, and time of administration.  Follow-up plan:   Return in about 2 weeks (around 01/04/2021) for Proc-day (T,Th), (VV), (PPE).       Interventional Therapies  Risk  Complexity Considerations:   WNL   Planned  Pending:   (08/21/2020) referral to orthopedics to evaluate right hip superior labral tear.   Under consideration:      Completed:   Therapeutic bilateral lumbar facet MBB x3  (09/16/2019) (100/100/50/90-100)  Therapeutic right lumbar facet RFA x2 (01/18/2020) (100/100/100/100)  Therapeutic left lumbar facet RFA x2 (02/24/2020) (100/100/50/90-100)  Diagnostic right IA hip injection x1 (06/22/2020) (75/75/75)  Diagnostic right gluteal femoral and greater trochanteric bursa injection x1 (06/22/2020) (75/25/25)    Therapeutic  Palliative (PRN) options:   Palliative bilateral lumbar facet MBB #4  Palliative right lumbar facet RFA #3  Palliative left lumbar facet RFA #3       Recent Visits Date Type Provider Dept  12/18/20 Appointment Milinda Pointer, MD [facility]  11/15/20 Telemedicine Milinda Pointer, MD [facility]  Showing recent visits within past 90 days and meeting all other requirements Today's Visits Date Type Provider Dept  12/21/20 Procedure visit Milinda Pointer, MD [facility]  Showing today's visits and meeting all other requirements Future Appointments Date Type Provider Dept  01/04/21 Appointment Milinda Pointer, [facility]  03/21/21 Appointment Milinda Pointer, MD [facility]  Showing future appointments within next 90 days and meeting all other requirements Disposition: Discharge home  Discharge (Date  Time): 12/21/2020; 0942 hrs.   Primary Care Physician: Dorothyann Peng, NP Location: [facility] Note by: [name], MD Date: 12/21/2020; Time: 2:04 PM  Disclaimer:  Medicine is not an Chief Strategy Officer. The only guarantee in medicine is that  nothing is guaranteed. It is important to note that the decision to proceed with this intervention was based on the information collected from the patient. The Data and conclusions were drawn from the patient's questionnaire, the interview, and the physical examination. Because the information was provided in large part by the patient, it cannot be guaranteed that it has not been purposely or unconsciously manipulated. Every effort has been made to obtain as much relevant data as possible for this evaluation. It is important to note  that the conclusions that lead to this procedure are derived in large part from the available data. Always take into account that the treatment will also be dependent on availability of resources and existing treatment guidelines, considered by other Pain Management Practitioners as being common knowledge and practice, at the time of the intervention. For Medico-Legal purposes, it is also important to point out that variation in procedural techniques and pharmacological choices are the acceptable norm. The indications, contraindications, technique, and results of the above procedure should only be interpreted and judged by a Board-Certified Interventional Pain Specialist with extensive familiarity and expertise in the same exact procedure and technique.

## 2020-12-21 ENCOUNTER — Ambulatory Visit
Admission: RE | Admit: 2020-12-21 | Discharge: 2020-12-21 | Disposition: A | Discharge status: Home / Self Care | Payer: 59 | Source: Ambulatory Visit | Attending: Pain Medicine | Admitting: Pain Medicine

## 2020-12-21 ENCOUNTER — Encounter: Payer: Self-pay | Admitting: Pain Medicine

## 2020-12-21 ENCOUNTER — Other Ambulatory Visit (HOSPITAL_COMMUNITY): Payer: Self-pay

## 2020-12-21 ENCOUNTER — Ambulatory Visit (HOSPITAL_BASED_OUTPATIENT_CLINIC_OR_DEPARTMENT_OTHER): Payer: 59 | Admitting: Pain Medicine

## 2020-12-21 ENCOUNTER — Other Ambulatory Visit: Payer: Self-pay

## 2020-12-21 VITALS — BP 130/73 | HR 62 | Temp 97.1°F | Resp 15 | Ht 66.0 in | Wt 200.0 lb

## 2020-12-21 DIAGNOSIS — M5416 Radiculopathy, lumbar region: Secondary | ICD-10-CM

## 2020-12-21 DIAGNOSIS — M5137 Other intervertebral disc degeneration, lumbosacral region: Secondary | ICD-10-CM

## 2020-12-21 DIAGNOSIS — M961 Postlaminectomy syndrome, not elsewhere classified: Secondary | ICD-10-CM | POA: Diagnosis not present

## 2020-12-21 DIAGNOSIS — M47816 Spondylosis without myelopathy or radiculopathy, lumbar region: Secondary | ICD-10-CM

## 2020-12-21 DIAGNOSIS — Z79899 Other long term (current) drug therapy: Secondary | ICD-10-CM | POA: Insufficient documentation

## 2020-12-21 DIAGNOSIS — Z5189 Encounter for other specified aftercare: Secondary | ICD-10-CM | POA: Insufficient documentation

## 2020-12-21 DIAGNOSIS — M4310 Spondylolisthesis, site unspecified: Secondary | ICD-10-CM

## 2020-12-21 DIAGNOSIS — M5126 Other intervertebral disc displacement, lumbar region: Secondary | ICD-10-CM

## 2020-12-21 DIAGNOSIS — Z79891 Long term (current) use of opiate analgesic: Secondary | ICD-10-CM | POA: Diagnosis present

## 2020-12-21 DIAGNOSIS — G894 Chronic pain syndrome: Secondary | ICD-10-CM | POA: Diagnosis not present

## 2020-12-21 MED ORDER — DEXAMETHASONE SODIUM PHOSPHATE 10 MG/ML IJ SOLN
INTRAMUSCULAR | Status: AC
  Filled 2020-12-21: qty 2

## 2020-12-21 MED ORDER — LIDOCAINE HCL 2 % IJ SOLN
20.0000 mL | Freq: Once | INTRAMUSCULAR | Status: AC
Start: 2020-12-21 — End: 2020-12-21
  Administered 2020-12-21: 400 mg

## 2020-12-21 MED ORDER — HYDROCODONE-ACETAMINOPHEN 5-325 MG PO TABS
1.0000 | ORAL_TABLET | Freq: Three times a day (TID) | ORAL | 0 refills | Status: DC | PRN
  Filled 2021-02-20 – 2021-02-22 (×2): qty 90, 30d supply, fill #0

## 2020-12-21 MED ORDER — ROPIVACAINE HCL 2 MG/ML IJ SOLN
INTRAMUSCULAR | Status: AC
  Filled 2020-12-21: qty 20

## 2020-12-21 MED ORDER — HYDROCODONE-ACETAMINOPHEN 5-325 MG PO TABS
1.0000 | ORAL_TABLET | Freq: Three times a day (TID) | ORAL | 0 refills | Status: DC | PRN
  Filled 2021-01-23: qty 90, 30d supply, fill #0

## 2020-12-21 MED ORDER — LACTATED RINGERS IV SOLN
1000.0000 mL | Freq: Once | INTRAVENOUS | Status: AC
  Administered 2020-12-21: 1000 mL via INTRAVENOUS

## 2020-12-21 MED ORDER — ROPIVACAINE HCL 2 MG/ML IJ SOLN
2.0000 mL | Freq: Once | INTRAMUSCULAR | Status: AC
Start: 2020-12-21 — End: 2020-12-21
  Administered 2020-12-21: 20 mL via EPIDURAL

## 2020-12-21 MED ORDER — MIDAZOLAM HCL 5 MG/5ML IJ SOLN
0.5000 mg | Freq: Once | INTRAMUSCULAR | Status: AC
  Administered 2020-12-21: 2 mg via INTRAVENOUS
  Filled 2020-12-21: qty 5

## 2020-12-21 MED ORDER — HYDROCODONE-ACETAMINOPHEN 5-325 MG PO TABS
1.0000 | ORAL_TABLET | Freq: Three times a day (TID) | ORAL | 0 refills | Status: DC | PRN
Start: 2020-12-22 — End: 2021-03-22
  Filled 2020-12-22: qty 90, 30d supply, fill #0

## 2020-12-21 MED ORDER — SODIUM CHLORIDE 0.9% FLUSH
2.0000 mL | Freq: Once | INTRAVENOUS | Status: AC
Start: 2020-12-21 — End: 2020-12-21
  Administered 2020-12-21: 10 mL

## 2020-12-21 MED ORDER — DEXAMETHASONE SODIUM PHOSPHATE 10 MG/ML IJ SOLN
20.0000 mg | Freq: Once | INTRAMUSCULAR | Status: AC
Start: 2020-12-21 — End: 2020-12-21
  Administered 2020-12-21: 10 mg

## 2020-12-21 MED ORDER — LIDOCAINE HCL 2 % IJ SOLN
INTRAMUSCULAR | Status: AC
  Filled 2020-12-21: qty 20

## 2020-12-21 MED ORDER — IOHEXOL 180 MG/ML  SOLN
10.0000 mL | Freq: Once | INTRAMUSCULAR | Status: AC
  Administered 2020-12-21: 5 mL via INTRATHECAL

## 2020-12-21 MED ORDER — IOHEXOL 180 MG/ML  SOLN
INTRAMUSCULAR | Status: AC
  Filled 2020-12-21: qty 20

## 2020-12-21 MED ORDER — SODIUM CHLORIDE (PF) 0.9 % IJ SOLN
INTRAMUSCULAR | Status: AC
  Filled 2020-12-21: qty 10

## 2020-12-21 NOTE — Patient Instructions (Signed)
____________________________________________________________________________________________  Post-Procedure Discharge Instructions  Instructions: Apply ice:  Purpose: This will minimize any swelling and discomfort after procedure.  When: Day of procedure, as soon as you get home. How: Fill a plastic sandwich bag with crushed ice. Cover it with a small towel and apply to injection site. How long: (15 min on, 15 min off) Apply for 15 minutes then remove x 15 minutes.  Repeat sequence on day of procedure, until you go to bed. Apply heat:  Purpose: To treat any soreness and discomfort from the procedure. When: Starting the next day after the procedure. How: Apply heat to procedure site starting the day following the procedure. How long: May continue to repeat daily, until discomfort goes away. Food intake: Start with clear liquids (like water) and advance to regular food, as tolerated.  Physical activities: Keep activities to a minimum for the first 8 hours after the procedure. After that, then as tolerated. Driving: If you have received any sedation, be responsible and do not drive. You are not allowed to drive for 24 hours after having sedation. Blood thinner: (Applies only to those taking blood thinners) You may restart your blood thinner 6 hours after your procedure. Insulin: (Applies only to Diabetic patients taking insulin) As soon as you can eat, you may resume your normal dosing schedule. Infection prevention: Keep procedure site clean and dry. Shower daily and clean area with soap and water. Post-procedure Pain Diary: Extremely important that this be done correctly and accurately. Recorded information will be used to determine the next step in treatment. For the purpose of accuracy, follow these rules: Evaluate only the area treated. Do not report or include pain from an untreated area. For the purpose of this evaluation, ignore all other areas of pain, except for the treated area. After  your procedure, avoid taking a long nap and attempting to complete the pain diary after you wake up. Instead, set your alarm clock to go off every hour, on the hour, for the initial 8 hours after the procedure. Document the duration of the numbing medicine, and the relief you are getting from it. Do not go to sleep and attempt to complete it later. It will not be accurate. If you received sedation, it is likely that you were given a medication that may cause amnesia. Because of this, completing the diary at a later time may cause the information to be inaccurate. This information is needed to plan your care. Follow-up appointment: Keep your post-procedure follow-up evaluation appointment after the procedure (usually 2 weeks for most procedures, 6 weeks for radiofrequencies). DO NOT FORGET to bring you pain diary with you.   Expect: (What should I expect to see with my procedure?) From numbing medicine (AKA: Local Anesthetics): Numbness or decrease in pain. You may also experience some weakness, which if present, could last for the duration of the local anesthetic. Onset: Full effect within 15 minutes of injected. Duration: It will depend on the type of local anesthetic used. On the average, 1 to 8 hours.  From steroids (Applies only if steroids were used): Decrease in swelling or inflammation. Once inflammation is improved, relief of the pain will follow. Onset of benefits: Depends on the amount of swelling present. The more swelling, the longer it will take for the benefits to be seen. In some cases, up to 10 days. Duration: Steroids will stay in the system x 2 weeks. Duration of benefits will depend on multiple posibilities including persistent irritating factors. Side-effects: If present, they  may typically last 2 weeks (the duration of the steroids). Frequent: Cramps (if they occur, drink Gatorade and take over-the-counter Magnesium 450-500 mg once to twice a day); water retention with temporary  weight gain; increases in blood sugar; decreased immune system response; increased appetite. Occasional: Facial flushing (red, warm cheeks); mood swings; menstrual changes. Uncommon: Long-term decrease or suppression of natural hormones; bone thinning. (These are more common with higher doses or more frequent use. This is why we prefer that our patients avoid having any injection therapies in other practices.)  Very Rare: Severe mood changes; psychosis; aseptic necrosis. From procedure: Some discomfort is to be expected once the numbing medicine wears off. This should be minimal if ice and heat are applied as instructed.  Call if: (When should I call?) You experience numbness and weakness that gets worse with time, as opposed to wearing off. New onset bowel or bladder incontinence. (Applies only to procedures done in the spine)  Emergency Numbers: Durning business hours (Monday - Thursday, 8:00 AM - 4:00 PM) (Friday, 9:00 AM - 12:00 Noon): [phone] After hours: [phone] NOTE: If you are having a problem and are unable connect with, or to talk to a provider, then go to your nearest urgent care or emergency department. If the problem is serious and urgent, please call 911. ____________________________________________________________________________________________  ____________________________________________________________________________________________  Medication Rules  Purpose: To inform patients, and their family members, of our rules and regulations.  Applies to: All patients receiving prescriptions (written or electronic).  Pharmacy of record: Pharmacy where electronic prescriptions will be sent. If written prescriptions are taken to a different pharmacy, please inform the nursing staff. The pharmacy listed in the electronic medical record should be the one where you would like electronic prescriptions to be sent.  Electronic prescriptions: In compliance with the [place] (STOP) Act of 2017 (Session Lanny Cramp [phone]), effective March 18, 2018, all controlled substances must be electronically prescribed. Calling prescriptions to the pharmacy will cease to exist.  Prescription refills: Only during scheduled appointments. Applies to all prescriptions.  NOTE: The following applies primarily to controlled substances (Opioid* Pain Medications).   Type of encounter (visit): For patients receiving controlled substances, face-to-face visits are required. (Not an option or up to the patient.)  Patient's responsibilities: Pain Pills: Bring all pain pills to every appointment (except for procedure appointments). Pill Bottles: Bring pills in original pharmacy bottle. Always bring the newest bottle. Bring bottle, even if empty. Medication refills: You are responsible for knowing and keeping track of what medications you take and those you need refilled. The day before your appointment: write a list of all prescriptions that need to be refilled. The day of the appointment: give the list to the admitting nurse. Prescriptions will be written only during appointments. No prescriptions will be written on procedure days. If you forget a medication: it will not be "Called in", "Faxed", or "electronically sent". You will need to get another appointment to get these prescribed. No early refills. Do not call asking to have your prescription filled early. Prescription Accuracy: You are responsible for carefully inspecting your prescriptions before leaving our office. Have the discharge nurse carefully go over each prescription with you, before taking them home. Make sure that your name is accurately spelled, that your address is correct. Check the name and dose of your medication to make sure it is accurate. Check the number of pills, and the written instructions to make sure they are clear and accurate. Make  sure that you are given enough  medication to last until your next medication refill appointment. Taking Medication: Take medication as prescribed. When it comes to controlled substances, taking less pills or less frequently than prescribed is permitted and encouraged. Never take more pills than instructed. Never take medication more frequently than prescribed.  Inform other Doctors: Always inform, all of your healthcare providers, of all the medications you take. Pain Medication from other Providers: You are not allowed to accept any additional pain medication from any other Doctor or Healthcare provider. There are two exceptions to this rule. (see below) In the event that you require additional pain medication, you are responsible for notifying us, as stated below. Cough Medicine: Often these contain an opioid, such as codeine or hydrocodone. Never accept or take cough medicine containing these opioids if you are already taking an opioid* medication. The combination may cause respiratory failure and death. Medication Agreement: You are responsible for carefully reading and following our Medication Agreement. This must be signed before receiving any prescriptions from our practice. Safely store a copy of your signed Agreement. Violations to the Agreement will result in no further prescriptions. (Additional copies of our Medication Agreement are available upon request.) Laws, Rules, & Regulations: All patients are expected to follow all Federal and [employer], [employer], Rules, [employer] [facility]. Ignorance of the Laws does not constitute a valid excuse.  Illegal drugs and Controlled Substances: The use of illegal substances (including, but not limited to marijuana and its derivatives) and/or the illegal use of any controlled substances is strictly prohibited. Violation of this rule may result in the immediate and permanent discontinuation of any and all prescriptions being written by our practice. The use of any illegal substances is  prohibited. Adopted CDC guidelines & recommendations: Target dosing levels will be at or below 60 MME/day. Use of benzodiazepines** is not recommended.  Exceptions: There are only two exceptions to the rule of not receiving pain medications from other Healthcare Providers. Exception #1 (Emergencies): In the event of an emergency (i.e.: accident requiring emergency care), you are allowed to receive additional pain medication. However, you are responsible for: As soon as you are able, call our office [phone], at any time of the day or night, and leave a message stating your name, the date and nature of the emergency, and the name and dose of the medication prescribed. In the event that your call is answered by a member of our staff, make sure to document and save the date, time, and the name of the person that took your information.  Exception #2 (Planned Surgery): In the event that you are scheduled by another doctor or dentist to have any type of surgery or procedure, you are allowed (for a period no longer than 30 days), to receive additional pain medication, for the acute post-op pain. However, in this case, you are responsible for picking up a copy of our "Post-op Pain Management for Surgeons" handout, and giving it to your surgeon or dentist. This document is available at our office, and does not require an appointment to obtain it. Simply go to our office during business hours (Monday-Thursday from 8:00 AM to 4:00 PM) (Friday 8:00 AM to 12:00 Noon) or if you have a scheduled appointment with [place], prior to your surgery, and ask for it by name. In addition, you are responsible for: calling our office [phone], at any time of the day or night, and leaving a message stating your name, name of your  surgeon, type of surgery, and date of procedure or surgery. Failure to comply with your responsibilities may result in termination of therapy involving the controlled substances. Medication Agreement  Violation. Following the above rules, including your responsibilities will help you in avoiding a Medication Agreement Violation ("Breaking your Pain Medication Contract").  *Opioid medications include: morphine, codeine, oxycodone, oxymorphone, hydrocodone, hydromorphone, meperidine, tramadol, tapentadol, buprenorphine, fentanyl, methadone. **Benzodiazepine medications include: diazepam (Valium), alprazolam (Xanax), clonazepam (Klonopine), lorazepam (Ativan), clorazepate (Tranxene), chlordiazepoxide (Librium), estazolam (Prosom), oxazepam (Serax), temazepam (Restoril), triazolam (Halcion) (Last updated: 12/13/2020) ____________________________________________________________________________________________  ____________________________________________________________________________________________  Medication Recommendations and Reminders  Applies to: All patients receiving prescriptions (written and/or electronic).  Medication Rules & Regulations: These rules and regulations exist for your safety and that of others. They are not flexible and neither are we. Dismissing or ignoring them will be considered "non-compliance" with medication therapy, resulting in complete and irreversible termination of such therapy. (See document titled "Medication Rules" for more details.) In all conscience, because of safety reasons, we cannot continue providing a therapy where the patient does not follow instructions.  Pharmacy of record:  Definition: This is the pharmacy where your electronic prescriptions will be sent.  We do not endorse any particular pharmacy, however, we have experienced problems with [facility] not securing enough medication supply for the community. We do not restrict you in your choice of pharmacy. However, once we write for your prescriptions, we will NOT be re-sending more prescriptions to fix restricted supply problems created by your pharmacy, or your insurance.  The pharmacy listed in  the electronic medical record should be the one where you want electronic prescriptions to be sent. If you choose to change pharmacy, simply notify our nursing staff.  Recommendations: Keep all of your pain medications in a safe place, under lock and key, even if you live alone. We will NOT replace lost, stolen, or damaged medication. After you fill your prescription, take 1 week's worth of pills and put them away in a safe place. You should keep a separate, properly labeled bottle for this purpose. The remainder should be kept in the original bottle. Use this as your primary supply, until it runs out. Once it's gone, then you know that you have 1 week's worth of medicine, and it is time to come in for a prescription refill. If you do this correctly, it is unlikely that you will ever run out of medicine. To make sure that the above recommendation works, it is very important that you make sure your medication refill appointments are scheduled at least 1 week before you run out of medicine. To do this in an effective manner, make sure that you do not leave the office without scheduling your next medication management appointment. Always ask the nursing staff to show you in your prescription , when your medication will be running out. Then arrange for the receptionist to get you a return appointment, at least 7 days before you run out of medicine. Do not wait until you have 1 or 2 pills left, to come in. This is very poor planning and does not take into consideration that we may need to cancel appointments due to bad weather, sickness, or emergencies affecting our staff. DO NOT ACCEPT A "Partial Fill": If for any reason your pharmacy does not have enough pills/tablets to completely fill or refill your prescription, do not allow for a "partial fill". The law allows the pharmacy to complete that prescription within 72 hours, without requiring a new prescription. If they  do not fill the rest of your prescription  within those 72 hours, you will need a separate prescription to fill the remaining amount, which we will NOT provide. If the reason for the partial fill is your insurance, you will need to talk to the pharmacist about payment alternatives for the remaining tablets, but again, DO NOT ACCEPT A PARTIAL FILL, unless you can trust your pharmacist to obtain the remainder of the pills within 72 hours.  Prescription refills and/or changes in medication(s):  Prescription refills, and/or changes in dose or medication, will be conducted only during scheduled medication management appointments. (Applies to both, written and electronic prescriptions.) No refills on procedure days. No medication will be changed or started on procedure days. No changes, adjustments, and/or refills will be conducted on a procedure day. Doing so will interfere with the diagnostic portion of the procedure. No phone refills. No medications will be "called into the pharmacy". No Fax refills. No weekend refills. No Holliday refills. No after hours refills.  Remember:  Business hours are:  Monday to Thursday 8:00 AM to 4:00 PM Provider's Schedule: Milinda Pointer, MD - Appointments are:  Medication management: Monday and Wednesday 8:00 AM to 4:00 PM Procedure day: Tuesday and Thursday 7:30 AM to 4:00 PM Gillis Santa, MD - Appointments are:  Medication management: Tuesday and Thursday 8:00 AM to 4:00 PM Procedure day: Monday and Wednesday 7:30 AM to 4:00 PM (Last update: 10/06/2019) ____________________________________________________________________________________________  ____________________________________________________________________________________________  CBD (cannabidiol) & Delta-8 (Delta-8 tetrahydrocannabinol) WARNING  Intro: Cannabidiol (CBD) and tetrahydrocannabinol (THC), are two natural compounds found in plants of the Cannabis genus. They can both be extracted from hemp or cannabis. Hemp and cannabis come  from the Cannabis sativa plant. Both compounds interact with your body's endocannabinoid system, but they have very different effects. CBD does not produce the high sensation associated with cannabis. Delta-8 tetrahydrocannabinol, also known as delta-8 THC, is a psychoactive substance found in the Cannabis sativa plant, of which marijuana and hemp are two varieties. THC is responsible for the high associated with the illicit use of marijuana.  Applicable to: All individuals currently taking or considering taking CBD (cannabidiol) and, more important, all patients taking opioid analgesic controlled substances (pain medication). (Example: oxycodone; oxymorphone; hydrocodone; hydromorphone; morphine; methadone; tramadol; tapentadol; fentanyl; buprenorphine; butorphanol; dextromethorphan; meperidine; codeine; etc.)  Legal status: CBD remains a Schedule I drug prohibited for any use. CBD is illegal with one exception. In the [place], CBD has a limited Transport planner (FDA) approval for the treatment of two specific types of epilepsy disorders. Only one CBD product has been approved by the FDA for this purpose: "Epidiolex". FDA is aware that some companies are marketing products containing cannabis and cannabis-derived compounds in ways that violate the [employer], Drug and Cosmetic Act [facility] Act) and that may put the health and safety of consumers at risk. The FDA, a Federal agency, has not enforced the CBD status since 2018.   Legality: Some manufacturers ship CBD products nationally, which is illegal. Often such products are sold online and are therefore available throughout the country. CBD is openly sold in head shops and health food stores in some states where such sales have not been explicitly legalized. Selling unapproved products with unsubstantiated therapeutic claims is not only a violation of the law, but also can put patients at risk, as these products have not been proven to be  safe or effective. Federal illegality makes it difficult to conduct research on CBD.  Reference: "FDA  Regulation of Cannabis and Cannabis-Derived Products, Including Cannabidiol (CBD)" - [employer]  Warning: CBD is not FDA approved and has not undergo the same manufacturing controls as prescription drugs.  This means that the purity and safety of available CBD may be questionable. Most of the time, despite manufacturer's claims, it is contaminated with THC (delta-9-tetrahydrocannabinol - the chemical in marijuana responsible for the "HIGH").  When this is the case, the [facility] contaminant will trigger a positive urine drug screen (UDS) test for Marijuana (carboxy-THC). Because a positive UDS for any illicit substance is a violation of our medication agreement, your opioid analgesics (pain medicine) may be permanently discontinued. The FDA recently put out a warning about 5 things that everyone should be aware of regarding Delta-8 THC: Delta-8 THC products have not been evaluated or approved by the FDA for safe use and may be marketed in ways that put the public health at risk. The FDA has received adverse event reports involving delta-8 THC-containing products. Delta-8 THC has psychoactive and intoxicating effects. Delta-8 THC manufacturing often involve use of potentially harmful chemicals to create the concentrations of delta-8 THC claimed in the marketplace. The final delta-8 THC product may have potentially harmful by-products (contaminants) due to the chemicals used in the process. Manufacturing of delta-8 THC products may occur in uncontrolled or unsanitary settings, which may lead to the presence of unsafe contaminants or other potentially harmful substances. Delta-8 THC products should be kept out of the reach of children and pets.  MORE ABOUT CBD  General Information: CBD was  discovered in 85 and it is a derivative of the cannabis sativa genus plants (Marijuana and Hemp). It is one of the 113 identified substances found in Marijuana. It accounts for up to 40% of the plant's extract. As of 2018, preliminary clinical studies on CBD included research for the treatment of anxiety, movement disorders, and pain. CBD is available and consumed in multiple forms, including inhalation of smoke or vapor, as an aerosol spray, and by mouth. It may be supplied as an oil containing CBD, capsules, dried cannabis, or as a liquid solution. CBD is thought not to be as psychoactive as THC (delta-9-tetrahydrocannabinol - the chemical in marijuana responsible for the "HIGH"). Studies suggest that CBD may interact with different biological target receptors in the body, including cannabinoid and other neurotransmitter receptors. As of 2018 the mechanism of action for its biological effects has not been determined.  Side-effects  Adverse reactions: Dry mouth, diarrhea, decreased appetite, fatigue, drowsiness, malaise, weakness, sleep disturbances, and others.  Drug interactions: CBC may interact with other medications such as blood-thinners. (Last update: 12/15/2020) ____________________________________________________________________________________________  ____________________________________________________________________________________________  Drug Holidays (Slow)  What is a "Drug Holiday"? Drug Holiday: is the name given to the period of time during which a patient stops taking a medication(s) for the purpose of eliminating tolerance to the drug.  Benefits Improved effectiveness of opioids. Decreased opioid dose needed to achieve benefits. Improved pain with lesser dose.  What is tolerance? Tolerance: is the progressive decreased in effectiveness of a drug due to its repetitive use. With repetitive use, the body gets use to the medication and as a consequence, it loses its  effectiveness. This is a common problem seen with opioid pain medications. As a result, a larger dose of the drug is needed to achieve the same effect that used to be obtained with a smaller dose.  How long should a "Drug Holiday" last? You should stay off of the pain medicine for at least 14  consecutive days. (2 weeks)  Should I stop the medicine "cold [place]"? No. You should always coordinate with your Pain Specialist so that he/she can provide you with the correct medication dose to make the transition as smoothly as possible.  How do I stop the medicine? Slowly. You will be instructed to decrease the daily amount of pills that you take by one (1) pill every seven (7) days. This is called a "slow downward taper" of your dose. For example: if you normally take four (4) pills per day, you will be asked to drop this dose to three (3) pills per day for seven (7) days, then to two (2) pills per day for seven (7) days, then to one (1) per day for seven (7) days, and at the end of those last seven (7) days, this is when the "Drug Holiday" would start.   Will I have withdrawals? By doing a "slow downward taper" like this one, it is unlikely that you will experience any significant withdrawal symptoms. Typically, what triggers withdrawals is the sudden stop of a high dose opioid therapy. Withdrawals can usually be avoided by slowly decreasing the dose over a prolonged period of time. If you do not follow these instructions and decide to stop your medication abruptly, withdrawals may be possible.  What are withdrawals? Withdrawals: refers to the wide range of symptoms that occur after stopping or dramatically reducing opiate drugs after heavy and prolonged use. Withdrawal symptoms do not occur to patients that use low dose opioids, or those who take the medication sporadically. Contrary to benzodiazepine (example: Valium, Xanax, etc.) or alcohol withdrawals ("Delirium Tremens"), opioid withdrawals are not  lethal. Withdrawals are the physical manifestation of the body getting rid of the excess receptors.  Expected Symptoms Early symptoms of withdrawal may include: Agitation Anxiety Muscle aches Increased tearing Insomnia Runny nose Sweating Yawning  Late symptoms of withdrawal may include: Abdominal cramping Diarrhea Dilated pupils Goose bumps Nausea Vomiting  Will I experience withdrawals? Due to the slow nature of the taper, it is very unlikely that you will experience any.  What is a slow taper? Taper: refers to the gradual decrease in dose.  (Last update: 10/06/2019) ____________________________________________________________________________________________

## 2020-12-21 NOTE — Progress Notes (Signed)
Nursing Pain Medication Assessment:  Safety precautions to be maintained throughout the outpatient stay will include: orient to surroundings, keep bed in low position, maintain call bell within reach at all times, provide assistance with transfer out of bed and ambulation.  Medication Inspection Compliance: Chad Wilson did not comply with our request to bring his pills to be counted. He was reminded that bringing the medication bottles, even when empty, is a requirement.  Medication: None brought in. Pill/Patch Count: None available to be counted. Bottle Appearance: No container available. Did not bring bottle(s) to appointment. Filled Date: N/A Last Medication intake:  Today Safety precautions to be maintained throughout the outpatient stay will include: orient to surroundings, keep bed in low position, maintain call bell within reach at all times, provide assistance with transfer out of bed and ambulation.   Pt was unaware that his medications refill was today and left his medication bottle at home.

## 2020-12-22 ENCOUNTER — Other Ambulatory Visit (HOSPITAL_COMMUNITY): Payer: Self-pay

## 2020-12-22 ENCOUNTER — Telehealth: Payer: Self-pay | Admitting: *Deleted

## 2020-12-22 NOTE — Telephone Encounter (Signed)
No problems post procedure. 

## 2021-01-03 ENCOUNTER — Other Ambulatory Visit: Payer: Self-pay | Admitting: Gastroenterology

## 2021-01-03 ENCOUNTER — Other Ambulatory Visit (HOSPITAL_COMMUNITY): Payer: Self-pay

## 2021-01-04 ENCOUNTER — Other Ambulatory Visit: Payer: Self-pay

## 2021-01-04 ENCOUNTER — Other Ambulatory Visit (HOSPITAL_COMMUNITY): Payer: Self-pay

## 2021-01-04 ENCOUNTER — Ambulatory Visit: Payer: 59 | Attending: Pain Medicine | Admitting: Pain Medicine

## 2021-01-04 DIAGNOSIS — M545 Low back pain, unspecified: Secondary | ICD-10-CM | POA: Diagnosis not present

## 2021-01-04 DIAGNOSIS — M5137 Other intervertebral disc degeneration, lumbosacral region: Secondary | ICD-10-CM

## 2021-01-04 DIAGNOSIS — M47816 Spondylosis without myelopathy or radiculopathy, lumbar region: Secondary | ICD-10-CM

## 2021-01-04 DIAGNOSIS — G894 Chronic pain syndrome: Secondary | ICD-10-CM

## 2021-01-04 DIAGNOSIS — G8929 Other chronic pain: Secondary | ICD-10-CM

## 2021-01-04 DIAGNOSIS — M4310 Spondylolisthesis, site unspecified: Secondary | ICD-10-CM

## 2021-01-04 DIAGNOSIS — M47817 Spondylosis without myelopathy or radiculopathy, lumbosacral region: Secondary | ICD-10-CM | POA: Diagnosis not present

## 2021-01-04 MED ORDER — PANTOPRAZOLE SODIUM 40 MG PO TBEC
40.0000 mg | DELAYED_RELEASE_TABLET | Freq: Every day | ORAL | 0 refills | Status: DC
  Filled 2021-01-04: qty 30, 30d supply, fill #0

## 2021-01-04 NOTE — Patient Instructions (Signed)
______________________________________________________________________  Preparing for Procedure with Sedation  NOTICE: Due to recent regulatory changes, starting on October 16, 2020, procedures requiring intravenous (IV) sedation will no longer be performed at the [facility].  These types of procedures are required to be performed at [facility] ambulatory surgery facility.  We are very sorry for the inconvenience.  Procedure appointments are limited to planned procedures: No Prescription Refills. No disability issues will be discussed. No medication changes will be discussed.  Instructions: Oral Intake: Do not eat or drink anything for at least 8 hours prior to your procedure. (Exception: Blood Pressure Medication. See below.) Transportation: A driver is required. You may not drive yourself after the procedure. Blood Pressure Medicine: Do not forget to take your blood pressure medicine with a sip of water the morning of the procedure. If your Diastolic (lower reading) is above 100 mmHg, elective cases will be cancelled/rescheduled. Blood thinners: These will need to be stopped for procedures. Notify our staff if you are taking any blood thinners. Depending on which one you take, there will be specific instructions on how and when to stop it. Diabetics on insulin: Notify the staff so that you can be scheduled 1st case in the morning. If your diabetes requires high dose insulin, take only  of your normal insulin dose the morning of the procedure and notify the staff that you have done so. Preventing infections: Shower with an antibacterial soap the morning of your procedure. Build-up your immune system: Take 1000 mg of Vitamin C with every meal (3 times a day) the day prior to your procedure. Antibiotics: Inform the staff if you have a condition or reason that requires you to take antibiotics before dental procedures. Pregnancy: If you are pregnant, call and cancel the procedure. Sickness: If  you have a cold, fever, or any active infections, call and cancel the procedure. Arrival: You must be in the facility at least 30 minutes prior to your scheduled procedure. Children: Do not bring children with you. Dress appropriately: Bring dark clothing that you would not mind if they get stained. Valuables: Do not bring any jewelry or valuables.  Reasons to call and reschedule or cancel your procedure: (Following these recommendations will minimize the risk of a serious complication.) Surgeries: Avoid having procedures within 2 weeks of any surgery. (Avoid for 2 weeks before or after any surgery). Flu Shots: Avoid having procedures within 2 weeks of a flu shots. (Avoid for 2 weeks before or after immunizations). Barium: Avoid having a procedure within 7-10 days after having had a radiological study involving the use of radiological contrast. (Myelograms, Barium swallow or enema study). Heart attacks: Avoid any elective procedures or surgeries for the initial 6 months after a "Myocardial Infarction" (Heart Attack). Blood thinners: It is imperative that you stop these medications before procedures. Let us know if you if you take any blood thinner.  Infection: Avoid procedures during or within two weeks of an infection (including chest colds or gastrointestinal problems). Symptoms associated with infections include: Localized redness, fever, chills, night sweats or profuse sweating, burning sensation when voiding, cough, congestion, stuffiness, runny nose, sore throat, diarrhea, nausea, vomiting, cold or Flu symptoms, recent or current infections. It is specially important if the infection is over the area that we intend to treat. Heart and lung problems: Symptoms that may suggest an active cardiopulmonary problem include: cough, chest pain, breathing difficulties or shortness of breath, dizziness, ankle swelling, uncontrolled high or unusually low blood pressure, and/or palpitations. If you are  experiencing any of these symptoms, cancel your procedure and contact your primary care physician for an evaluation.  Remember:  Regular Business hours are:  Monday to Thursday 8:00 AM to 4:00 PM  Provider's Schedule: Milinda Pointer, MD:  Procedure days: Tuesday and Thursday 7:30 AM to 4:00 PM  Gillis Santa, MD:  Procedure days: Monday and Wednesday 7:30 AM to 4:00 PM ______________________________________________________________________  ____________________________________________________________________________________________  General Risks and Possible Complications  Patient Responsibilities: It is important that you read this as it is part of your informed consent. It is our duty to inform you of the risks and possible complications associated with treatments offered to you. It is your responsibility as a patient to read this and to ask questions about anything that is not clear or that you believe was not covered in this document.  Patient's Rights: You have the right to refuse treatment. You also have the right to change your mind, even after initially having agreed to have the treatment done. However, under this last option, if you wait until the last second to change your mind, you may be charged for the materials used up to that point.  Introduction: Medicine is not an Chief Strategy Officer. Everything in Medicine, including the lack of treatment(s), carries the potential for danger, harm, or loss (which is by definition: Risk). In Medicine, a complication is a secondary problem, condition, or disease that can aggravate an already existing one. All treatments carry the risk of possible complications. The fact that a side effects or complications occurs, does not imply that the treatment was conducted incorrectly. It must be clearly understood that these can happen even when everything is done following the highest safety standards.  No treatment: You can choose not to proceed with the  proposed treatment alternative. The "PRO(s)" would include: avoiding the risk of complications associated with the therapy. The "CON(s)" would include: not getting any of the treatment benefits. These benefits fall under one of three categories: diagnostic; therapeutic; and/or palliative. Diagnostic benefits include: getting information which can ultimately lead to improvement of the disease or symptom(s). Therapeutic benefits are those associated with the successful treatment of the disease. Finally, palliative benefits are those related to the decrease of the primary symptoms, without necessarily curing the condition (example: decreasing the pain from a flare-up of a chronic condition, such as incurable terminal cancer).  General Risks and Complications: These are associated to most interventional treatments. They can occur alone, or in combination. They fall under one of the following six (6) categories: no benefit or worsening of symptoms; bleeding; infection; nerve damage; allergic reactions; and/or death. No benefits or worsening of symptoms: In Medicine there are no guarantees, only probabilities. No healthcare provider can ever guarantee that a medical treatment will work, they can only state the probability that it may. Furthermore, there is always the possibility that the condition may worsen, either directly, or indirectly, as a consequence of the treatment. Bleeding: This is more common if the patient is taking a blood thinner, either prescription or over the counter (example: Goody Powders, Fish oil, Aspirin, Garlic, etc.), or if suffering a condition associated with impaired coagulation (example: Hemophilia, cirrhosis of the liver, low platelet counts, etc.). However, even if you do not have one on these, it can still happen. If you have any of these conditions, or take one of these drugs, make sure to notify your treating physician. Infection: This is more common in patients with a compromised  immune system, either due to disease (example:  diabetes, cancer, human immunodeficiency virus [HIV], etc.), or due to medications or treatments (example: therapies used to treat cancer and rheumatological diseases). However, even if you do not have one on these, it can still happen. If you have any of these conditions, or take one of these drugs, make sure to notify your treating physician. Nerve Damage: This is more common when the treatment is an invasive one, but it can also happen with the use of medications, such as those used in the treatment of cancer. The damage can occur to small secondary nerves, or to large primary ones, such as those in the spinal cord and brain. This damage may be temporary or permanent and it may lead to impairments that can range from temporary numbness to permanent paralysis and/or brain death. Allergic Reactions: Any time a substance or material comes in contact with our body, there is the possibility of an allergic reaction. These can range from a mild skin rash (contact dermatitis) to a severe systemic reaction (anaphylactic reaction), which can result in death. Death: In general, any medical intervention can result in death, most of the time due to an unforeseen complication. ____________________________________________________________________________________________

## 2021-01-04 NOTE — Progress Notes (Signed)
Patient: Chad Wilson  Service Category: E/M  Provider: [name], MD  DOB: 02-Jun-1975  DOS: 01/04/2021  Location: Office  MRN: [id number]  Setting: Ambulatory outpatient  Referring Provider: Dorothyann Peng, NP  Type: Established Patient  Specialty: Interventional Pain Management  PCP: Chad Peng, NP  Location: Remote location  Delivery: TeleHealth     Virtual Encounter - Pain Management PROVIDER NOTE: Information contained herein reflects review and annotations entered in association with encounter. Interpretation of such information and data should be left to medically-trained personnel. Information provided to patient can be located elsewhere in the medical record under "Patient Instructions". Document created using STT-dictation technology, any transcriptional errors that may result from process are unintentional.    Contact & Pharmacy Preferred: [phone] Home: [phone] (home) Mobile: [phone] (mobile) E-mail: [email or web address] [place] [address] Phone: [phone] Fax: [place], [address] [place] [address] Phone: [phone] Fax: [phone]   Pre-screening  [name] offered "in-person" vs "virtual" encounter. He indicated preferring virtual for this encounter.   Reason COVID-19*  Social distancing based on CDC and AMA recommendations.   I contacted Chad Wilson on 01/04/2021 via telephone.      I clearly identified myself as Chad Cola, MD. I verified that I was speaking with the correct person using two identifiers (Name: Chad Wilson, and date of birth: 03/29/1975).  Consent I sought verbal advanced consent from Chad Wilson for virtual visit interactions. I informed Chad Wilson of possible security and privacy concerns, risks, and limitations associated with  providing "not-in-person" medical evaluation and management services. I also informed Chad Wilson of the availability of "in-person" appointments. Finally, I informed him that there would be a charge for the virtual visit and that he could be  personally, fully or partially, financially responsible for it. Chad Wilson expressed understanding and agreed to proceed.   Historic Elements   Chad Wilson is a 45 y.o. year old, male patient evaluated today after our last contact on 12/21/2020. Chad Wilson  has a past medical history of Acute hepatitis C without mention of hepatic coma(070.51), Acute postoperative pain (12/10/2018), Alcohol abuse, unspecified, Anxiety and depression, Anxiety states, Essential hypertension, benign, GERD (gastroesophageal reflux disease), Graves disease, Panic disorder without agoraphobia, Thyroid disease, and Tobacco use disorder. He also  has a past surgical history that includes Multiple tooth extractions (15 yrs. ago); Anterior cervical decomp/discectomy fusion (N/A, 05/21/2017); and Wisdom tooth extraction. Chad Wilson has a current medication list which includes the following prescription(s): albuterol, alprazolam, atorvastatin, buspirone, colchicine, carestart covid-19 home test, depo-testosterone, fenofibrate, hydrocodone-acetaminophen, [START ON 01/21/2021] hydrocodone-acetaminophen, [START ON 02/20/2021] hydrocodone-acetaminophen, levothyroxine, lisinopril, nabumetone, nitroglycerin, pregabalin, sertraline, sildenafil, testosterone cypionate, testosterone cypionate, tizanidine, pantoprazole, testosterone cypionate, and [DISCONTINUED] omeprazole. He  reports that he has been smoking cigarettes. He has a 30.00 pack-year smoking history. His smokeless tobacco use includes chew. He reports current alcohol use of about 42.0 standard drinks per week. He reports that he does not currently use drugs. Chad Wilson is allergic to chantix [varenicline].   HPI  Today, he is being contacted for a  post-procedure assessment. The patient indicates having attained 100% relief of the pain for the duration of the local anesthetic.  This 100% relief persisted for a week or so and then it started fading away.  As  it turns out, he is leg pain is completely gone and what he is currently experiencing is pain limited to the lower back on the right side.  This is probably coming from his facet joints and the last time that we did a radiofrequency of the right side was on 01/18/2020.  It is now 01/04/2021 and therefore it is likely that it is wearing off.  I will go ahead and set it up to have it repeated and hopefully this will again provide him with good relief for a longer period of time.  The plan was shared with the patient who understood and agreed.  Post-Procedure Evaluation  Procedure (12/21/2020):  Procedure:           Anesthesia, Analgesia, Anxiolysis:  Type: Trans-Foraminal Epidural Steroid Injection          Purpose: Diagnostic/Therapeutic Region: Posterolateral Lumbosacral Target Area: The 6 o'clock position under the pedicle, on the affected side. Approach: Posterior Percutaneous Paravertebral approach. Level: L3 & L4 Level Laterality: Right        Type: Local Anesthesia Local Anesthetic: Lidocaine 1-2% Sedation: Minimal Anxiolysis  Indication(s): Anxiety & Analgesia Route: Infiltration (Ginger Blue/IM) IV Access: Available     Position: Prone    Indications: 1. DDD (degenerative disc disease), lumbosacral   2. Failed back surgical syndrome   3. Grade 1 Lumbosacral Retrolisthesis of L5/S1   4. Herniated nucleus pulposus, L3-4 right   5. Lumbar spondylosis   6. Subacute right lumbar radiculopathy   7. Encounter for therapeutic procedure     Pain Score: Pre-procedure: 5 /10 Post-procedure: 0-No pain/10    RTCB: 03/22/2021  Anxiolysis: Please see nurses note.  Effectiveness during initial hour after procedure (Ultra-Short Term Relief): 100 %.  Local anesthetic used: Long-acting (4-6  hours) Effectiveness: Defined as any analgesic benefit obtained secondary to the administration of local anesthetics. This carries significant diagnostic value as to the etiological location, or anatomical origin, of the pain. Duration of benefit is expected to coincide with the duration of the local anesthetic used.  Effectiveness during initial 4-6 hours after procedure (Short-Term Relief): 100 %.  Long-term benefit: Defined as any relief past the pharmacologic duration of the local anesthetics.  Effectiveness past the initial 6 hours after procedure (Long-Term Relief): 100 % (reports that pain relief lasted approx 1 week and then the pain gradually returned.).  Benefits, current: Defined as benefit present at the time of this evaluation.   Analgesia: The patient indicates having attained 100% relief of the pain for the duration of the local anesthetic.  This 100% relief persisted for a week or so and then it started fading away. Function: Chad Wilson reports improvement in function ROM: Chad Wilson reports improvement in ROM  Pharmacotherapy Assessment   Analgesic: Hydrocodone/APAP 5/325, 1 tab p.o. every 8 hours (15 mg/day of hydrocodone) MME/day: 15 mg/day.   Monitoring: [place] PMP: PDMP reviewed during this encounter.       Pharmacotherapy: No side-effects or adverse reactions reported. Compliance: No problems identified. Effectiveness: Clinically acceptable. Plan: Refer to "POC". UDS:  Summary  Date Value Ref Range Status  08/21/2020 Note  Final    Comment:    ==================================================================== [employer] Select 13 (MW) ==================================================================== Test                             Result       Flag       Units  Drug Present and Declared for Prescription Verification  Alprazolam                     142          EXPECTED   ng/mg creat   Alpha-hydroxyalprazolam        404          EXPECTED   ng/mg creat    Source of  alprazolam is a scheduled prescription medication. Alpha-    hydroxyalprazolam is an expected metabolite of alprazolam.    Hydrocodone                    1313         EXPECTED   ng/mg creat   Hydromorphone                  105          EXPECTED   ng/mg creat   Dihydrocodeine                 158          EXPECTED   ng/mg creat   Norhydrocodone                 1237         EXPECTED   ng/mg creat    Sources of hydrocodone include scheduled prescription medications.    Hydromorphone, dihydrocodeine and norhydrocodone are expected    metabolites of hydrocodone. Hydromorphone and dihydrocodeine are    also available as scheduled prescription medications.  ==================================================================== Test                      Result    Flag   Units      Ref Range   Creatinine              184              mg/dL      >=20 ==================================================================== Declared Medications:  The flagging and interpretation on this report are based on the  following declared medications.  Unexpected results may arise from  inaccuracies in the declared medications.   **Note: The testing scope of this panel includes these medications:   Alprazolam (Xanax)  Hydrocodone (Norco)   **Note: The testing scope of this panel does not include the  following reported medications:   Acetaminophen (Norco)  Albuterol (Ventolin HFA)  Atorvastatin (Lipitor)  Buspirone (Buspar)  Colchicine  Levothyroxine (Synthroid)  Lisinopril (Zestril)  Nabumetone (Relafen)  Nitroglycerin  Omeprazole (Prilosec)  Pantoprazole (Protonix)  Pregabalin (Lyrica)  Sertraline (Zoloft)  Sildenafil  Testosterone  Tizanidine (Zanaflex) ==================================================================== For clinical consultation, please call [phone]. ====================================================================      Laboratory Chemistry Profile   Renal Lab  Results  Component Value Date   BUN 15 10/12/2020   CREATININE 0.85 10/12/2020   BCR 8 (L) 06/03/2018   GFR 104.18 10/12/2020   GFRAA >60 10/05/2018   GFRNONAA >60 10/05/2018    Hepatic Lab Results  Component Value Date   AST 19 10/12/2020   ALT 24 10/12/2020   ALBUMIN 4.3 10/12/2020   ALKPHOS 59 10/12/2020   HCVAB (A) 09/29/2009    Reactive (NOTE) Result repeated and verified.  This test is for screening purposes only.  Reactive results should be confirmed by an alternative method.  Suggest HCV Qualitative, PCR, test code  83130.  Specimens will be stable for reflex testing up to 3 days after collection.   LIPASE 26 10/05/2018    Electrolytes Lab Results  Component Value Date   NA 140 10/12/2020   K 3.9 10/12/2020   CL 102 10/12/2020   CALCIUM 9.4 10/12/2020   MG 2.1 06/03/2018    Bone Lab Results  Component Value Date   VD25OH 21.01 (L) 02/17/2018   TESTOFREE See below 02/17/2018   TESTOSTERONE 147 (L) 02/17/2018    Inflammation (CRP: Acute Phase) (ESR: Chronic Phase) Lab Results  Component Value Date   CRP 1 06/03/2018   ESRSEDRATE 21 (H) 06/03/2018         Note: Above Lab results reviewed.  Imaging  DG PAIN CLINIC C-ARM 1-60 MIN NO REPORT Fluoro was used, but no Radiologist interpretation will be provided.  Please refer to "NOTES" tab for provider progress note.  Assessment  The primary encounter diagnosis was Chronic low back pain (1ry area of Pain) (Bilateral) (R=L) w/o sciatica. Diagnoses of Grade 1 Lumbosacral Retrolisthesis of L5/S1, Lumbar facet hypertrophy (Bilateral), Lumbar facet syndrome (Bilateral), Spondylosis without myelopathy or radiculopathy, lumbosacral region, DDD (degenerative disc disease), lumbosacral, Lumbar spondylosis, and Chronic pain syndrome were also pertinent to this visit.  Plan of Care  Problem-specific:  No problem-specific Assessment & Plan notes found for  this encounter.  Mr. Chad E Wilson has a current medication list which includes the following long-term medication(s): albuterol, atorvastatin, colchicine, depo-testosterone, fenofibrate, hydrocodone-acetaminophen, [START ON 01/21/2021] hydrocodone-acetaminophen, [START ON 02/20/2021] hydrocodone-acetaminophen, levothyroxine, lisinopril, nitroglycerin, pregabalin, sertraline, testosterone cypionate, testosterone cypionate, pantoprazole, testosterone cypionate, and [DISCONTINUED] omeprazole.  Pharmacotherapy (Medications Ordered): No orders of the defined types were placed in this encounter.  Orders:  Orders Placed This Encounter  Procedures   Radiofrequency,Lumbar    Standing Status:   Future    Standing Expiration Date:   04/06/2021    Scheduling Instructions:     Side(s): Right-sided     Level: L3-4, L4-5, & L5-S1 Facets (L2, L3, L4, L5, & S1 Medial Branch Nerves)     Sedation: Patient's choice.     Scheduling Timeframe: As soon as pre-approved    Order Specific Question:   Where will this procedure be performed?    Answer:   [facility]    Follow-up plan:   Return for (63mn), (Clinic) procedure: (R) L-FCT RFA #3, (Sed-anx).      Interventional Therapies  Risk  Complexity Considerations:   Estimated body mass index is 32.28 kg/m as calculated from the following:   Height as of 12/21/20: 5' 6"  (1.676 m).   Weight as of 12/21/20: 200 lb (90.7 kg). WNL   Planned  Pending:   Pending further evaluation   Under consideration:   Therapeutic right lumbar facet RFA #3    Completed:   (08/21/2020) referral to orthopedics to evaluate right hip superior labral tear. Therapeutic bilateral lumbar facet MBB x3  (09/16/2019) (100/100/50/90-100)  Therapeutic right lumbar facet RFA x2 (01/18/2020) (100/100/100/100)  Therapeutic left lumbar facet RFA x2 (02/24/2020) (100/100/50/90-100)  Diagnostic right IA hip injection x1 (06/22/2020) (75/75/75)  Diagnostic right gluteal femoral and  greater trochanteric bursa injection x1 (06/22/2020) (75/25/25)  Diagnostic/therapeutic right L3 and L4 TFESI x1 (12/21/2020) (100/100/100/100) of the leg pain.   Therapeutic  Palliative (PRN) options:   Palliative bilateral lumbar facet MBB #4  Palliative  right lumbar facet RFA #3  Palliative left lumbar facet RFA #3     Recent Visits Date Type Provider Dept  12/21/20 Procedure visit Milinda Pointer, MD [facility]  11/15/20 Telemedicine Milinda Pointer, MD [facility]  Showing recent visits within past 90 days and meeting all other requirements Today's Visits Date Type Provider Dept  01/04/21 Office Visit Milinda Pointer, MD [facility]  Showing today's visits and meeting all other requirements Future Appointments Date Type Provider Dept  03/21/21 Appointment Milinda Pointer, MD [facility]  Showing future appointments within next 90 days and meeting all other requirements I discussed the assessment and treatment plan with the patient. The patient was provided an opportunity to ask questions and all were answered. The patient agreed with the plan and demonstrated an understanding of the instructions.  Patient advised to call back or seek an in-person evaluation if the symptoms or condition worsens.  Duration of encounter: 18 minutes.  Note by: Chad Cola, MD Date: 01/04/2021; Time: 3:30 PM

## 2021-01-15 ENCOUNTER — Other Ambulatory Visit (HOSPITAL_COMMUNITY): Payer: Self-pay

## 2021-01-22 ENCOUNTER — Other Ambulatory Visit: Payer: Self-pay | Admitting: Adult Health

## 2021-01-22 ENCOUNTER — Other Ambulatory Visit: Payer: Self-pay | Admitting: Gastroenterology

## 2021-01-23 ENCOUNTER — Other Ambulatory Visit (HOSPITAL_COMMUNITY): Payer: Self-pay

## 2021-01-23 MED ORDER — FENOFIBRATE 145 MG PO TABS
145.0000 mg | ORAL_TABLET | Freq: Every day | ORAL | 0 refills | Status: DC
  Filled 2021-01-23: qty 90, 90d supply, fill #0

## 2021-01-23 MED ORDER — PANTOPRAZOLE SODIUM 40 MG PO TBEC
40.0000 mg | DELAYED_RELEASE_TABLET | Freq: Every day | ORAL | 0 refills | Status: DC
  Filled 2021-01-23 – 2021-02-11 (×2): qty 15, 15d supply, fill #0

## 2021-01-29 ENCOUNTER — Other Ambulatory Visit (HOSPITAL_COMMUNITY): Payer: Self-pay

## 2021-01-30 ENCOUNTER — Other Ambulatory Visit (HOSPITAL_COMMUNITY): Payer: Self-pay

## 2021-01-30 ENCOUNTER — Ambulatory Visit: Payer: 59 | Admitting: Pain Medicine

## 2021-01-30 MED ORDER — TESTOSTERONE CYPIONATE 200 MG/ML IM SOLN
100.0000 mg | INTRAMUSCULAR | 1 refills | Status: DC
  Filled 2021-01-30: qty 1, 7d supply, fill #0
  Filled 2021-05-02: qty 1, 7d supply, fill #1

## 2021-02-05 ENCOUNTER — Other Ambulatory Visit (HOSPITAL_COMMUNITY): Payer: Self-pay

## 2021-02-06 ENCOUNTER — Other Ambulatory Visit (HOSPITAL_COMMUNITY): Payer: Self-pay

## 2021-02-06 ENCOUNTER — Other Ambulatory Visit (HOSPITAL_BASED_OUTPATIENT_CLINIC_OR_DEPARTMENT_OTHER): Payer: Self-pay | Admitting: Nurse Practitioner

## 2021-02-06 ENCOUNTER — Ambulatory Visit: Payer: 59 | Admitting: Pain Medicine

## 2021-02-06 DIAGNOSIS — J329 Chronic sinusitis, unspecified: Secondary | ICD-10-CM

## 2021-02-06 MED ORDER — AMOXICILLIN-POT CLAVULANATE 875-125 MG PO TABS
1.0000 | ORAL_TABLET | Freq: Two times a day (BID) | ORAL | 0 refills | Status: DC
  Filled 2021-02-06: qty 10, 5d supply, fill #0

## 2021-02-06 NOTE — Progress Notes (Deleted)
No-show to procedure appointment

## 2021-02-11 ENCOUNTER — Other Ambulatory Visit: Payer: Self-pay

## 2021-02-12 ENCOUNTER — Other Ambulatory Visit (HOSPITAL_COMMUNITY): Payer: Self-pay

## 2021-02-14 ENCOUNTER — Other Ambulatory Visit (HOSPITAL_COMMUNITY): Payer: Self-pay

## 2021-02-14 ENCOUNTER — Other Ambulatory Visit: Payer: Self-pay

## 2021-02-16 ENCOUNTER — Other Ambulatory Visit (HOSPITAL_COMMUNITY): Payer: Self-pay

## 2021-02-19 ENCOUNTER — Other Ambulatory Visit (HOSPITAL_COMMUNITY): Payer: Self-pay

## 2021-02-20 ENCOUNTER — Other Ambulatory Visit (HOSPITAL_COMMUNITY): Payer: Self-pay

## 2021-02-22 ENCOUNTER — Other Ambulatory Visit (HOSPITAL_COMMUNITY): Payer: Self-pay

## 2021-03-05 ENCOUNTER — Other Ambulatory Visit (HOSPITAL_COMMUNITY): Payer: Self-pay

## 2021-03-05 MED ORDER — SILDENAFIL CITRATE 20 MG PO TABS
20.0000 mg | ORAL_TABLET | ORAL | 0 refills | Status: DC
  Filled 2021-03-05: qty 90, 18d supply, fill #0

## 2021-03-06 ENCOUNTER — Other Ambulatory Visit (HOSPITAL_COMMUNITY): Payer: Self-pay

## 2021-03-11 ENCOUNTER — Other Ambulatory Visit: Payer: Self-pay | Admitting: Gastroenterology

## 2021-03-11 ENCOUNTER — Other Ambulatory Visit: Payer: Self-pay | Admitting: Adult Health

## 2021-03-11 DIAGNOSIS — F32A Depression, unspecified: Secondary | ICD-10-CM

## 2021-03-13 ENCOUNTER — Other Ambulatory Visit (HOSPITAL_COMMUNITY): Payer: Self-pay

## 2021-03-13 MED ORDER — SERTRALINE HCL 100 MG PO TABS
200.0000 mg | ORAL_TABLET | Freq: Every day | ORAL | 1 refills | Status: DC
  Filled 2021-03-13 – 2021-07-08 (×2): qty 180, 90d supply, fill #0

## 2021-03-14 ENCOUNTER — Other Ambulatory Visit: Payer: Self-pay | Admitting: Gastroenterology

## 2021-03-15 ENCOUNTER — Other Ambulatory Visit (HOSPITAL_COMMUNITY): Payer: Self-pay

## 2021-03-16 ENCOUNTER — Other Ambulatory Visit: Payer: Self-pay | Admitting: Adult Health

## 2021-03-16 ENCOUNTER — Other Ambulatory Visit (HOSPITAL_COMMUNITY): Payer: Self-pay

## 2021-03-19 ENCOUNTER — Other Ambulatory Visit (HOSPITAL_COMMUNITY): Payer: Self-pay

## 2021-03-19 NOTE — Progress Notes (Deleted)
The patient did not show up to his appointment today.

## 2021-03-20 ENCOUNTER — Other Ambulatory Visit (HOSPITAL_COMMUNITY): Payer: Self-pay

## 2021-03-20 MED ORDER — PANTOPRAZOLE SODIUM 40 MG PO TBEC
40.0000 mg | DELAYED_RELEASE_TABLET | Freq: Every day | ORAL | 0 refills | Status: DC
  Filled 2021-03-20: qty 15, 15d supply, fill #0

## 2021-03-21 ENCOUNTER — Encounter: Payer: 59 | Admitting: Pain Medicine

## 2021-03-22 ENCOUNTER — Other Ambulatory Visit: Payer: Self-pay | Admitting: Pain Medicine

## 2021-03-22 ENCOUNTER — Ambulatory Visit: Payer: 59 | Admitting: Pain Medicine

## 2021-03-22 NOTE — Progress Notes (Addendum)
(  03/22/2021) the patient did not show up to his procedure appointment.  He also did not show up to his medication management encounter were he actually requested to have it switched to today's appointment.  He was initially scheduled for a right-sided lumbar facet radiofrequency ablation under fluoroscopic guidance and IV anxiolysis.  On his virtual visit on 11/15/2020 he was scheduled to return for a face-to-face medication management encounter on 12/18/2020.  On that same appointment we had scheduled him to also come in for a right-sided L3 and L4 TFESI.  That appointment was scheduled for 12/21/2020 and he requested to have his medication refilled on at that time and he did not come into the 12/18/2020 medication management encounter.  On 12/21/2020 he had the above procedure and he was scheduled for a virtual visit follow-up on 01/04/2021 and for his regular face-to-face medication management encounter on 03/21/2021.  On that 01/04/2021 follow-up it was determined that he had done well with the diagnostic lumbar facet block demonstrating that his prior radiofrequency ablation had wore off and therefore he was scheduled for a repeat right-sided lumbar facet RFA #3 today Thursday, March 22, 2021.  Unfortunately yesterday we were notified that the radiofrequency ablation device was malfunctioning and we called him and notified him of this and gave him the option of canceling the procedure and rescheduling for later time or to come in and have a palliative lumbar facet block and at the same time due to his face-to-face medication management encounter since he had called and canceled his 03/21/2021 medication management encounter and had postponed it for it to be done at the same time as the radiofrequency ablation.  When Cherly Anderson, RN (clinic coordinator) spoke to him yesterday, he chose to have the palliative facet block and to come in for his medication refill.  He was scheduled to come in today at 8:20 AM, but he did not  show up or call to cancel.  Unfortunately, this makes his fourth "no-show" in less than 12 months.  We have had some issues with noncompliance with this patient since he was admitted to our service.  To start with, after his initial evaluation where we had ordered some lab work and x-rays, he showed up to his follow-up evaluation without having done the x-rays that we had ordered.  He also showed up late to some of his initial appointments and he was given a warning that we would not be able to see him again if she showed up again more than 15 minutes late to his appointment.  In addition to that he "no showed" to his 05/29/2020, 07/24/2020, 02/06/2021.

## 2021-03-28 ENCOUNTER — Other Ambulatory Visit (HOSPITAL_COMMUNITY): Payer: Self-pay

## 2021-03-28 ENCOUNTER — Other Ambulatory Visit: Payer: Self-pay | Admitting: Pain Medicine

## 2021-03-28 DIAGNOSIS — Z79899 Other long term (current) drug therapy: Secondary | ICD-10-CM

## 2021-03-28 DIAGNOSIS — G894 Chronic pain syndrome: Secondary | ICD-10-CM

## 2021-03-28 DIAGNOSIS — M961 Postlaminectomy syndrome, not elsewhere classified: Secondary | ICD-10-CM

## 2021-03-28 DIAGNOSIS — Z79891 Long term (current) use of opiate analgesic: Secondary | ICD-10-CM

## 2021-03-28 NOTE — Progress Notes (Signed)
PROVIDER NOTE: Interpretation of information contained herein should be left to medically-trained personnel. Specific patient instructions are provided elsewhere under "Patient Instructions" section of medical record. This document was created in part using STT-dictation technology, any transcriptional errors that may result from this process are unintentional.  Patient: Chad Wilson Type: Established DOB: 1975-12-31 MRN: [id number] PCP: Dorothyann Peng, NP  Service: Procedure DOS: 03/29/2021 Setting: Ambulatory Location: Ambulatory outpatient facility Delivery: Face-to-face Provider: [name], MD Specialty: Interventional Pain Management Specialty designation: 09 Location: Outpatient facility Ref. Prov.: Milinda Pointer, MD    Primary Reason for Visit: Interventional Pain Management Treatment. CC: Back Pain (Left, lower)  Procedure:           Type: Lumbar Facet, Medial Branch Radiofrequency Ablation (RFA) #3  Laterality: Left  Level: L2, L3, L4, L5, & S1 Medial Branch Level(s). These levels will denervate the L3-4, L4-5, and the L5-S1 lumbar facet joints.  Imaging: Fluoroscopic guidance Anesthesia: Local anesthesia (1-2% Lidocaine) Anxiolysis: IV (Versed 2 mg) Sedation: None.  DOS: 03/29/2021  Performed by: [name], MD  Purpose: Therapeutic/Palliative Indications: Low back pain severe enough to impact quality of life or function. Indications: 1. Lumbar facet syndrome (Bilateral)   2. Spondylosis without myelopathy or radiculopathy, lumbosacral region   3. Lumbar facet hypertrophy (Bilateral)   4. DDD (degenerative disc disease), lumbosacral   5. Chronic low back pain (1ry area of Pain) (Bilateral) (R=L) w/o sciatica   6. Grade 1 Lumbosacral Retrolisthesis of L5/S1    Chad Wilson has been dealing with the above chronic pain for longer than three months and has either failed to respond, was unable to tolerate, or simply did not get enough benefit from other more  conservative therapies including, but not limited to: 1. Over-the-counter medications 2. Anti-inflammatory medications 3. Muscle relaxants 4. Membrane stabilizers 5. Opioids 6. Physical therapy and/or chiropractic manipulation 7. Modalities (Heat, ice, etc.) 8. Invasive techniques such as nerve blocks. [name] has attained more than 50% relief of the pain from a series of diagnostic injections conducted in separate occasions.  Pain Score: Pre-procedure: 7 /10 Post-procedure: 0-No pain/10  Note: The patient was initially scheduled to come in for a right-sided lumbar facet RFA.  Upon arrival he indicated that lately his pain has been much worse on the left side and he wanted to do that side first.  Anyway we have him scheduled to do both sides.  We will simply do the left first and 6 weeks from now we will bring him back for radiofrequency ablation of the right side.   Position / Prep / Materials:  Position: Prone  Prep solution: DuraPrep (Iodine Povacrylex [0.7% available iodine] and Isopropyl Alcohol, 74% w/w) Prep Area: Entire Lumbosacral Region (Lower back from mid-thoracic region to end of tailbone and from flank to flank.) Materials:  Tray: RFA (Radiofrequency) tray Needle(s):  Type: RFA (Teflon-coated radiofrequency ablation needles) Gauge (G): 22  Length: Regular (10cm) Qty: 5  Pre-op H&P Assessment:  Chad Wilson is a 46 y.o. (year old), male patient, seen today for interventional treatment. He  has a past surgical history that includes Multiple tooth extractions (15 yrs. ago); Anterior cervical decomp/discectomy fusion (N/A, 05/21/2017); and Wisdom tooth extraction. Chad Wilson has a current medication list which includes the following prescription(s): albuterol, alprazolam, atorvastatin, buspirone, colchicine, carestart covid-19 home test, depo-testosterone, fenofibrate, [START ON 04/11/2021] hydrocodone-acetaminophen, [START ON 05/11/2021] hydrocodone-acetaminophen, [START ON 06/10/2021]  hydrocodone-acetaminophen, levothyroxine, lisinopril, nabumetone, nitroglycerin, oxycodone-acetaminophen, [START ON 04/04/2021] oxycodone-acetaminophen, pantoprazole, pregabalin, sertraline, sildenafil, testosterone  cypionate, testosterone cypionate, tizanidine, testosterone cypionate, and [DISCONTINUED] omeprazole, and the following Facility-Administered Medications: fentanyl and pentafluoroprop-tetrafluoroeth. His primarily concern today is the Back Pain (Left, lower)  Initial Vital Signs:  Pulse/HCG Rate: 74ECG Heart Rate: 76 Temp: (!) 97.2 F (36.2 C) Resp: 16 BP: (!) 152/84 SpO2: 97 %  BMI: Estimated body mass index is 34.54 kg/m as calculated from the following:   Height as of this encounter: 5' 6"  (1.676 m).   Weight as of this encounter: 214 lb (97.1 kg).  Risk Assessment: Allergies: Reviewed. He is allergic to chantix [varenicline].  Allergy Precautions: None required Coagulopathies: Reviewed. None identified.  Blood-thinner therapy: None at this time Active Infection(s): Reviewed. None identified. Mr. Hufnagle is afebrile  Site Confirmation: Chad Wilson was asked to confirm the procedure and laterality before marking the site Procedure checklist: Completed Consent: Before the procedure and under the influence of no sedative(s), amnesic(s), or anxiolytics, the patient was informed of the treatment options, risks and possible complications. To fulfill our ethical and legal obligations, as recommended by the American Medical Association's Code of Ethics, I have informed the patient of my clinical impression; the nature and purpose of the treatment or procedure; the risks, benefits, and possible complications of the intervention; the alternatives, including doing nothing; the risk(s) and benefit(s) of the alternative treatment(s) or procedure(s); and the risk(s) and benefit(s) of doing nothing. The patient was provided information about the general risks and possible complications associated  with the procedure. These may include, but are not limited to: failure to achieve desired goals, infection, bleeding, organ or nerve damage, allergic reactions, paralysis, and death. In addition, the patient was informed of those risks and complications associated to Spine-related procedures, such as failure to decrease pain; infection (i.e.: Meningitis, epidural or intraspinal abscess); bleeding (i.e.: epidural hematoma, subarachnoid hemorrhage, or any other type of intraspinal or peri-dural bleeding); organ or nerve damage (i.e.: Any type of peripheral nerve, nerve root, or spinal cord injury) with subsequent damage to sensory, motor, and/or autonomic systems, resulting in permanent pain, numbness, and/or weakness of one or several areas of the body; allergic reactions; (i.e.: anaphylactic reaction); and/or death. Furthermore, the patient was informed of those risks and complications associated with the medications. These include, but are not limited to: allergic reactions (i.e.: anaphylactic or anaphylactoid reaction(s)); adrenal axis suppression; blood sugar elevation that in diabetics may result in ketoacidosis or comma; water retention that in patients with history of congestive heart failure may result in shortness of breath, pulmonary edema, and decompensation with resultant heart failure; weight gain; swelling or edema; medication-induced neural toxicity; particulate matter embolism and blood vessel occlusion with resultant organ, and/or nervous system infarction; and/or aseptic necrosis of one or more joints. Finally, the patient was informed that Medicine is not an exact science; therefore, there is also the possibility of unforeseen or unpredictable risks and/or possible complications that may result in a catastrophic outcome. The patient indicated having understood very clearly. We have given the patient no guarantees and we have made no promises. Enough time was given to the patient to ask  questions, all of which were answered to the patient's satisfaction. Mr. Donaway has indicated that he wanted to continue with the procedure. Attestation: I, the ordering provider, attest that I have discussed with the patient the benefits, risks, side-effects, alternatives, likelihood of achieving goals, and potential problems during recovery for the procedure that I have provided informed consent. Date   Time: 03/29/2021  8:17 AM  Pre-Procedure Preparation:  Monitoring:  As per clinic protocol. Respiration, ETCO2, SpO2, BP, heart rate and rhythm monitor placed and checked for adequate function Safety Precautions: Patient was assessed for positional comfort and pressure points before starting the procedure. Time-out: I initiated and conducted the "Time-out" before starting the procedure, as per protocol. The patient was asked to participate by confirming the accuracy of the "Time Out" information. Verification of the correct person, site, and procedure were performed and confirmed by me, the nursing staff, and the patient. "Time-out" conducted as per Joint Commission's Universal Protocol (UP.01.01.01). Time: [phone]  Description of Procedure:          Laterality: Left Levels:  L2, L3, L4, L5, & S1 Medial Branch Level(s), at the L3-4, L4-5, and the L5-S1 lumbar facet joints. Safety Precautions: Aspiration looking for blood return was conducted prior to all injections. At no point did we inject any substances, as a needle was being advanced. Before injecting, the patient was told to immediately notify me if he was experiencing any new onset of "ringing in the ears, or metallic taste in the mouth". No attempts were made at seeking any paresthesias. Safe injection practices and needle disposal techniques used. Medications properly checked for expiration dates. SDV (single dose vial) medications used. After the completion of the procedure, all disposable equipment used was discarded in the proper designated medical  waste containers. Local Anesthesia: Protocol guidelines were followed. The patient was positioned over the fluoroscopy table. The area was prepped in the usual manner. The time-out was completed. The target area was identified using fluoroscopy. A 12-in long, straight, sterile hemostat was used with fluoroscopic guidance to locate the targets for each level blocked. Once located, the skin was marked with an approved surgical skin marker. Once all sites were marked, the skin (epidermis, dermis, and hypodermis), as well as deeper tissues (fat, connective tissue and muscle) were infiltrated with a small amount of a short-acting local anesthetic, loaded on a 10cc syringe with a 25G, 1.5-in  Needle. An appropriate amount of time was allowed for local anesthetics to take effect before proceeding to the next step. Technical description of process:  Radiofrequency Ablation (RFA) L2 Medial Branch Nerve RFA: The target area for the L2 medial branch is at the junction of the postero-lateral aspect of the superior articular process and the superior, posterior, and medial edge of the transverse process of L3. Under fluoroscopic guidance, a Radiofrequency needle was inserted until contact was made with os over the superior postero-lateral aspect of the pedicular shadow (target area). Sensory and motor testing was conducted to properly adjust the position of the needle. Once satisfactory placement of the needle was achieved, the numbing solution was slowly injected after negative aspiration for blood. 2.0 mL of the nerve block solution was injected without difficulty or complication. After waiting for at least 3 minutes, the ablation was performed. Once completed, the needle was removed intact. L3 Medial Branch Nerve RFA: The target area for the L3 medial branch is at the junction of the postero-lateral aspect of the superior articular process and the superior, posterior, and medial edge of the transverse process of L4. Under  fluoroscopic guidance, a Radiofrequency needle was inserted until contact was made with os over the superior postero-lateral aspect of the pedicular shadow (target area). Sensory and motor testing was conducted to properly adjust the position of the needle. Once satisfactory placement of the needle was achieved, the numbing solution was slowly injected after negative aspiration for blood. 2.0 mL of the nerve block solution  was injected without difficulty or complication. After waiting for at least 3 minutes, the ablation was performed. Once completed, the needle was removed intact. L4 Medial Branch Nerve RFA: The target area for the L4 medial branch is at the junction of the postero-lateral aspect of the superior articular process and the superior, posterior, and medial edge of the transverse process of L5. Under fluoroscopic guidance, a Radiofrequency needle was inserted until contact was made with os over the superior postero-lateral aspect of the pedicular shadow (target area). Sensory and motor testing was conducted to properly adjust the position of the needle. Once satisfactory placement of the needle was achieved, the numbing solution was slowly injected after negative aspiration for blood. 2.0 mL of the nerve block solution was injected without difficulty or complication. After waiting for at least 3 minutes, the ablation was performed. Once completed, the needle was removed intact. L5 Medial Branch Nerve RFA: The target area for the L5 medial branch is at the junction of the postero-lateral aspect of the superior articular process of S1 and the superior, posterior, and medial edge of the sacral ala. Under fluoroscopic guidance, a Radiofrequency needle was inserted until contact was made with os over the superior postero-lateral aspect of the pedicular shadow (target area). Sensory and motor testing was conducted to properly adjust the position of the needle. Once satisfactory placement of the needle was  achieved, the numbing solution was slowly injected after negative aspiration for blood. 2.0 mL of the nerve block solution was injected without difficulty or complication. After waiting for at least 3 minutes, the ablation was performed. Once completed, the needle was removed intact. S1 Medial Branch Nerve RFA: The target area for the S1 medial branch is located inferior to the junction of the S1 superior articular process and the L5 inferior articular process, posterior, inferior, and lateral to the 6 o'clock position of the L5-S1 facet joint, just superior to the S1 posterior foramen. Under fluoroscopic guidance, the Radiofrequency needle was advanced until contact was made with os over the Target area. Sensory and motor testing was conducted to properly adjust the position of the needle. Once satisfactory placement of the needle was achieved, the numbing solution was slowly injected after negative aspiration for blood. 2.0 mL of the nerve block solution was injected without difficulty or complication. After waiting for at least 3 minutes, the ablation was performed. Once completed, the needle was removed intact. Radiofrequency lesioning (ablation):  Radiofrequency Generator: NeuroTherm [id number] Sensory Stimulation Parameters: 50 Hz was used to locate & identify the nerve, making sure that the needle was positioned such that there was no sensory stimulation below 0.3 V or above 0.7 V. Motor Stimulation Parameters: 2 Hz was used to evaluate the motor component. Care was taken not to lesion any nerves that demonstrated motor stimulation of the lower extremities at an output of less than 2.5 times that of the sensory threshold, or a maximum of 2.0 V. Lesioning Technique Parameters: Standard Radiofrequency settings. (Not bipolar or pulsed.) Temperature Settings: 80 degrees C Lesioning time: 60 seconds Intra-operative Compliance: Compliant  Once the entire procedure was completed, the treated area was cleaned,  making sure to leave some of the prepping solution back to take advantage of its long term bactericidal properties.    Illustration of the posterior view of the lumbar spine and the posterior neural structures. Laminae of L2 through S1 are labeled. DPRL5, dorsal primary ramus of L5; DPRS1, dorsal primary ramus of S1; DPR3, dorsal primary ramus of L3;  FJ, facet (zygapophyseal) joint L3-L4; I, inferior articular process of L4; LB1, lateral branch of dorsal primary ramus of L1; IAB, inferior articular branches from L3 medial branch (supplies L4-L5 facet joint); IBP, intermediate branch plexus; MB3, medial branch of dorsal primary ramus of L3; NR3, third lumbar nerve root; S, superior articular process of L5; SAB, superior articular branches from L4 (supplies L4-5 facet joint also); TP3, transverse process of L3.  Vitals:   03/29/21 0918 03/29/21 0923 03/29/21 0928 03/29/21 0936  BP: (!) 150/94 (!) 152/94 (!) 150/92 (!) 145/92  Pulse: 73 74 72   Resp: 16 16 16 15   Temp:      TempSrc:      SpO2: 97% 97% 98% 100%  Weight:      Height:       Start Time: 0843 hrs. End Time: 0926 hrs.  Imaging Guidance (Spinal):          Type of Imaging Technique: Fluoroscopy Guidance (Spinal) Indication(s): Assistance in needle guidance and placement for procedures requiring needle placement in or near specific anatomical locations not easily accessible without such assistance. Exposure Time: Please see nurses notes. Contrast: None used. Fluoroscopic Guidance: I was personally present during the use of fluoroscopy. "Tunnel Vision Technique" used to obtain the best possible view of the target area. Parallax error corrected before commencing the procedure. "Direction-depth-direction" technique used to introduce the needle under continuous pulsed fluoroscopy. Once target was reached, antero-posterior, oblique, and lateral fluoroscopic projection used confirm needle placement in all planes. Images permanently stored in  EMR. Interpretation: No contrast injected. I personally interpreted the imaging intraoperatively. Adequate needle placement confirmed in multiple planes. Permanent images saved into the patient's record.  Antibiotic Prophylaxis:   Anti-infectives (From admission, onward)    None      Indication(s): None identified  Post-operative Assessment:  Post-procedure Vital Signs:  Pulse/HCG Rate: 72 (NSR)72 Temp: (!) 97.2 F (36.2 C) Resp: 15 BP: (!) 145/92 SpO2: 100 %  EBL: None  Complications: No immediate post-treatment complications observed by team, or reported by patient.  Note: The patient tolerated the entire procedure well. A repeat set of vitals were taken after the procedure and the patient was kept under observation following institutional policy, for this type of procedure. Post-procedural neurological assessment was performed, showing return to baseline, prior to discharge. The patient was provided with post-procedure discharge instructions, including a section on how to identify potential problems. Should any problems arise concerning this procedure, the patient was given instructions to immediately contact us, at any time, without hesitation. In any case, we plan to contact the patient by telephone for a follow-up status report regarding this interventional procedure.  Comments:  No additional relevant information.  Plan of Care  Orders:  Orders Placed This Encounter  Procedures   Radiofrequency,Lumbar    Scheduling Instructions:     Side(s): Left-sided     Level: L3-4, L4-5, & L5-S1 Facets (L2, L3, L4, L5, & S1 Medial Branch Nerves)     Sedation: Patient's choice.     Timeframe: Today    Order Specific Question:   Where will this procedure be performed?    Answer:   [facility]   Radiofrequency,Lumbar    Standing Status:   Future    Standing Expiration Date:   06/26/2021    Scheduling Instructions:     Side(s): Right-sided     Level: L3-4, L4-5, & L5-S1  Facets (L2, L3, L4, L5, & S1 Medial Branch Nerves)  Sedation: Patient's choice.     Scheduling Timeframe: 6 weeks from now    Order Specific Question:   Where will this procedure be performed?    Answer:   [facility]   DG PAIN CLINIC C-ARM 1-60 MIN NO REPORT    Intraoperative interpretation by procedural physician at [place].    Standing Status:   Standing    Number of Occurrences:   1    Order Specific Question:   Reason for exam:    Answer:   Assistance in needle guidance and placement for procedures requiring needle placement in or near specific anatomical locations not easily accessible without such assistance.   Informed Consent Details: Physician/Practitioner Attestation; Transcribe to consent form and obtain patient signature    Nursing Order: Transcribe to consent form and obtain patient signature. Note: Always confirm laterality of pain with Mr. Hellums, before procedure.    Order Specific Question:   Physician/Practitioner attestation of informed consent for procedure/surgical case    Answer:   I, the physician/practitioner, attest that I have discussed with the patient the benefits, risks, side effects, alternatives, likelihood of achieving goals and potential problems during recovery for the procedure that I have provided informed consent.    Order Specific Question:   Procedure    Answer:   Lumbar Facet Radiofrequency Ablation    Order Specific Question:   Physician/Practitioner performing the procedure    Answer:   Alexandr Oehler A. Dossie Arbour, MD    Order Specific Question:   Indication/Reason    Answer:   Low Back Pain, with our without leg pain, due to Facet Joint Arthralgia (Joint Pain) known as Lumbar Facet Syndrome, secondary to Lumbar, and/or Lumbosacral Spondylosis (Arthritis of the Spine), without myelopathy or radiculopathy (Nerve Damage).   Provide equipment / supplies at bedside    "Radiofrequency Tray"; Large hemostat (x1); Small hemostat (x1);  Towels (x8); 4x4 sterile sponge pack (x1) Needle type: Teflon-coated Radiofrequency Needle (Disposable   single use) Size: Regular Quantity: 5    Standing Status:   Standing    Number of Occurrences:   1    Order Specific Question:   Specify    Answer:   Radiofrequency Tray   Chronic Opioid Analgesic:  Hydrocodone/APAP 5/325, 1 tab p.o. every 8 hours (15 mg/day of hydrocodone) MME/day: 15 mg/day.   Medications ordered for procedure: Meds ordered this encounter  Medications   HYDROcodone-acetaminophen (NORCO/VICODIN) 5-325 MG tablet    Sig: Take 1 tablet by mouth 3 (three) times daily as needed for severe pain. Must last 30 days    Dispense:  90 tablet    Refill:  0    DO NOT: delete (not duplicate); no partial-fill (will deny script to complete), no refill request (F/U required). DISPENSE: 1 day early if closed on fill date. WARN: No CNS-depressants within 8 hrs of med.   oxyCODONE-acetaminophen (PERCOCET) 5-325 MG tablet    Sig: Take 1 tablet by mouth every 6 (six) hours as needed for up to 7 days for severe pain. Must last 7 days.    Dispense:  28 tablet    Refill:  0    For acute post-operative pain. Not to be refilled. Most last 7 days.   oxyCODONE-acetaminophen (PERCOCET) 5-325 MG tablet    Sig: Take 1 tablet by mouth every 6 (six) hours as needed for up to 7 days for severe pain. Must last 7 days.    Dispense:  28 tablet    Refill:  0  For acute post-operative pain. Not to be refilled. Must last 7 days.   HYDROcodone-acetaminophen (NORCO/VICODIN) 5-325 MG tablet    Sig: Take 1 tablet by mouth 3 (three) times daily as needed for severe pain. Must last 30 days    Dispense:  90 tablet    Refill:  0    DO NOT: delete (not duplicate); no partial-fill (will deny script to complete), no refill request (F/U required). DISPENSE: 1 day early if closed on fill date. WARN: No CNS-depressants within 8 hrs of med.   HYDROcodone-acetaminophen (NORCO/VICODIN) 5-325 MG tablet    Sig: Take  1 tablet by mouth 3 (three) times daily as needed for severe pain. Must last 30 days    Dispense:  90 tablet    Refill:  0    DO NOT: delete (not duplicate); no partial-fill (will deny script to complete), no refill request (F/U required). DISPENSE: 1 day early if closed on fill date. WARN: No CNS-depressants within 8 hrs of med.   lidocaine (XYLOCAINE) 2 % (with pres) injection 400 mg   pentafluoroprop-tetrafluoroeth (GEBAUERS) aerosol   lactated ringers infusion 1,000 mL   midazolam (VERSED) 5 MG/5ML injection 0.5-2 mg    Make sure Flumazenil is available in the pyxis when using this medication. If oversedation occurs, administer 0.2 mg IV over 15 sec. If after 45 sec no response, administer 0.2 mg again over 1 min; may repeat at 1 min intervals; not to exceed 4 doses (1 mg)   fentaNYL (SUBLIMAZE) injection 25-50 mcg    Make sure Narcan is available in the pyxis when using this medication. In the event of respiratory depression (RR< 8/min): Titrate NARCAN (naloxone) in increments of 0.1 to 0.2 mg IV at 2-3 minute intervals, until desired degree of reversal.   ropivacaine (PF) 2 mg/mL (0.2%) (NAROPIN) injection 9 mL   triamcinolone acetonide (KENALOG-40) injection 40 mg   Medications administered: We administered lidocaine, lactated ringers, midazolam, ropivacaine (PF) 2 mg/mL (0.2%), and triamcinolone acetonide.  See the medical record for exact dosing, route, and time of administration.  Follow-up plan:   Return in about 6 weeks (around 05/10/2021) for (45mn), (Clinic) procedure: (R) L-FCT RFA #3, (Sed-anx).       Interventional Therapies  Risk   Complexity Considerations:   Estimated body mass index is 32.28 kg/m as calculated from the following:   Height as of 12/21/20: 5' 6"  (1.676 m).   Weight as of 12/21/20: 200 lb (90.7 kg). WNL   Planned   Pending:   Therapeutic right lumbar facet RFA #3    Under consideration:   Therapeutic left lumbar facet RFA #3 (03/29/2021)     Completed:   (08/21/2020) referral to orthopedics to evaluate right hip superior labral tear. Therapeutic bilateral lumbar facet MBB x3  (09/16/2019) (100/100/50/90-100)  Therapeutic right lumbar facet RFA x2 (01/18/2020) (100/100/100/100)  Therapeutic left lumbar facet RFA x2 (02/24/2020) (100/100/50/90-100)  Diagnostic right IA hip injection x1 (06/22/2020) (75/75/75)  Diagnostic right gluteal femoral and greater trochanteric bursa injection x1 (06/22/2020) (75/25/25)  Diagnostic/therapeutic right L3 and L4 TFESI x1 (12/21/2020) (100/100/100/100) of the leg pain.   Therapeutic   Palliative (PRN) options:   Palliative bilateral lumbar facet MBB #4  Palliative right lumbar facet RFA #3  Palliative left lumbar facet RFA #3     Recent Visits Date Type Provider Dept  01/04/21 Office Visit NMilinda Pointer MD [facility]  Showing recent visits within past 90 days and meeting all other requirements Today's Visits Date Type  Provider Dept  03/29/21 Procedure visit Milinda Pointer, MD [facility]  Showing today's visits and meeting all other requirements Future Appointments Date Type Provider Dept  05/10/21 Appointment Milinda Pointer, MD [facility]  Showing future appointments within next 90 days and meeting all other requirements  Disposition: Discharge home  Discharge (Date   Time): 03/29/2021; 0942 hrs.   Primary Care Physician: Dorothyann Peng, NP Location: [facility] Note by: [name], MD Date: 03/29/2021; Time: 12:32 PM  Disclaimer:  Medicine is not an exact science. The only guarantee in medicine is that nothing is guaranteed. It is important to note that the decision to proceed with this intervention was based on the information collected from the patient. The Data and conclusions were drawn from the patient's questionnaire, the interview, and the physical examination. Because the information was provided in  large part by the patient, it cannot be guaranteed that it has not been purposely or unconsciously manipulated. Every effort has been made to obtain as much relevant data as possible for this evaluation. It is important to note that the conclusions that lead to this procedure are derived in large part from the available data. Always take into account that the treatment will also be dependent on availability of resources and existing treatment guidelines, considered by other Pain Management Practitioners as being common knowledge and practice, at the time of the intervention. For Medico-Legal purposes, it is also important to point out that variation in procedural techniques and pharmacological choices are the acceptable norm. The indications, contraindications, technique, and results of the above procedure should only be interpreted and judged by a Board-Certified Interventional Pain Specialist with extensive familiarity and expertise in the same exact procedure and technique.

## 2021-03-29 ENCOUNTER — Encounter: Payer: Self-pay | Admitting: Pain Medicine

## 2021-03-29 ENCOUNTER — Ambulatory Visit (HOSPITAL_BASED_OUTPATIENT_CLINIC_OR_DEPARTMENT_OTHER): Payer: 59 | Admitting: Pain Medicine

## 2021-03-29 ENCOUNTER — Other Ambulatory Visit (HOSPITAL_COMMUNITY): Payer: Self-pay

## 2021-03-29 ENCOUNTER — Other Ambulatory Visit: Payer: Self-pay

## 2021-03-29 ENCOUNTER — Ambulatory Visit
Admission: RE | Admit: 2021-03-29 | Discharge: 2021-03-29 | Disposition: A | Discharge status: Home / Self Care | Payer: 59 | Source: Ambulatory Visit | Attending: Pain Medicine | Admitting: Pain Medicine

## 2021-03-29 VITALS — BP 145/92 | HR 72 | Temp 97.2°F | Resp 15 | Ht 66.0 in | Wt 214.0 lb

## 2021-03-29 DIAGNOSIS — M47816 Spondylosis without myelopathy or radiculopathy, lumbar region: Secondary | ICD-10-CM | POA: Insufficient documentation

## 2021-03-29 DIAGNOSIS — Z5189 Encounter for other specified aftercare: Secondary | ICD-10-CM | POA: Diagnosis not present

## 2021-03-29 DIAGNOSIS — M545 Low back pain, unspecified: Secondary | ICD-10-CM | POA: Insufficient documentation

## 2021-03-29 DIAGNOSIS — G894 Chronic pain syndrome: Secondary | ICD-10-CM | POA: Diagnosis not present

## 2021-03-29 DIAGNOSIS — M47817 Spondylosis without myelopathy or radiculopathy, lumbosacral region: Secondary | ICD-10-CM | POA: Diagnosis not present

## 2021-03-29 DIAGNOSIS — Z79891 Long term (current) use of opiate analgesic: Secondary | ICD-10-CM | POA: Diagnosis present

## 2021-03-29 DIAGNOSIS — M961 Postlaminectomy syndrome, not elsewhere classified: Secondary | ICD-10-CM

## 2021-03-29 DIAGNOSIS — M5137 Other intervertebral disc degeneration, lumbosacral region: Secondary | ICD-10-CM | POA: Insufficient documentation

## 2021-03-29 DIAGNOSIS — M4310 Spondylolisthesis, site unspecified: Secondary | ICD-10-CM | POA: Insufficient documentation

## 2021-03-29 DIAGNOSIS — G8929 Other chronic pain: Secondary | ICD-10-CM | POA: Diagnosis not present

## 2021-03-29 DIAGNOSIS — Z79899 Other long term (current) drug therapy: Secondary | ICD-10-CM | POA: Insufficient documentation

## 2021-03-29 MED ORDER — LIDOCAINE HCL 2 % IJ SOLN
20.0000 mL | Freq: Once | INTRAMUSCULAR | Status: AC
  Administered 2021-03-29: 400 mg

## 2021-03-29 MED ORDER — HYDROCODONE-ACETAMINOPHEN 5-325 MG PO TABS
1.0000 | ORAL_TABLET | Freq: Three times a day (TID) | ORAL | 0 refills | Status: DC | PRN
Start: 2021-04-11 — End: 2021-05-17
  Filled 2021-04-11: qty 90, 30d supply, fill #0

## 2021-03-29 MED ORDER — LIDOCAINE HCL 2 % IJ SOLN
INTRAMUSCULAR | Status: AC
  Filled 2021-03-29: qty 20

## 2021-03-29 MED ORDER — MIDAZOLAM HCL 5 MG/5ML IJ SOLN
0.5000 mg | Freq: Once | INTRAMUSCULAR | Status: AC
  Administered 2021-03-29: 1.5 mg via INTRAVENOUS

## 2021-03-29 MED ORDER — HYDROCODONE-ACETAMINOPHEN 5-325 MG PO TABS
1.0000 | ORAL_TABLET | Freq: Three times a day (TID) | ORAL | 0 refills | Status: DC | PRN
Start: 2021-05-11 — End: 2021-08-01
  Filled 2021-05-14: qty 90, 30d supply, fill #0

## 2021-03-29 MED ORDER — HYDROCODONE-ACETAMINOPHEN 5-325 MG PO TABS
1.0000 | ORAL_TABLET | Freq: Three times a day (TID) | ORAL | 0 refills | Status: DC | PRN
Start: 2021-06-10 — End: 2021-08-23
  Filled 2021-06-12: qty 90, 30d supply, fill #0

## 2021-03-29 MED ORDER — ROPIVACAINE HCL 2 MG/ML IJ SOLN
INTRAMUSCULAR | Status: AC
  Filled 2021-03-29: qty 20

## 2021-03-29 MED ORDER — ROPIVACAINE HCL 2 MG/ML IJ SOLN
9.0000 mL | Freq: Once | INTRAMUSCULAR | Status: AC
  Administered 2021-03-29: 9 mL via PERINEURAL

## 2021-03-29 MED ORDER — OXYCODONE-ACETAMINOPHEN 5-325 MG PO TABS
1.0000 | ORAL_TABLET | Freq: Four times a day (QID) | ORAL | 0 refills | Status: AC | PRN
  Filled 2021-04-06: qty 28, 7d supply, fill #0

## 2021-03-29 MED ORDER — FENTANYL CITRATE (PF) 100 MCG/2ML IJ SOLN: 25.0000 ug | INTRAMUSCULAR | Status: DC | PRN

## 2021-03-29 MED ORDER — LACTATED RINGERS IV SOLN
1000.0000 mL | Freq: Once | INTRAVENOUS | Status: AC
  Administered 2021-03-29: 1000 mL via INTRAVENOUS

## 2021-03-29 MED ORDER — PENTAFLUOROPROP-TETRAFLUOROETH EX AERO
INHALATION_SPRAY | Freq: Once | CUTANEOUS | Status: DC
  Filled 2021-03-29: qty 116

## 2021-03-29 MED ORDER — TRIAMCINOLONE ACETONIDE 40 MG/ML IJ SUSP
40.0000 mg | Freq: Once | INTRAMUSCULAR | Status: AC
  Administered 2021-03-29: 40 mg

## 2021-03-29 MED ORDER — TRIAMCINOLONE ACETONIDE 40 MG/ML IJ SUSP
INTRAMUSCULAR | Status: AC
  Filled 2021-03-29: qty 1

## 2021-03-29 MED ORDER — MIDAZOLAM HCL 5 MG/5ML IJ SOLN
INTRAMUSCULAR | Status: AC
  Filled 2021-03-29: qty 5

## 2021-03-29 MED ORDER — OXYCODONE-ACETAMINOPHEN 5-325 MG PO TABS
1.0000 | ORAL_TABLET | Freq: Four times a day (QID) | ORAL | 0 refills | Status: AC | PRN
  Filled 2021-03-29: qty 28, 7d supply, fill #0

## 2021-03-29 NOTE — Patient Instructions (Signed)
___________________________________________________________________________________________  Post-Radiofrequency (RF) Discharge Instructions  You have just completed a Radiofrequency Neurotomy.  The following instructions will provide you with information and guidelines for self-care upon discharge.  If at any time you have questions or concerns please call your physician. DO NOT DRIVE YOURSELF!!  Instructions: Apply ice: Fill a plastic sandwich bag with crushed ice. Cover it with a small towel and apply to injection site. Apply for 15 minutes then remove x 15 minutes. Repeat sequence on day of procedure, until you go to bed. The purpose is to minimize swelling and discomfort after procedure. Apply heat: Apply heat to procedure site starting the day following the procedure. The purpose is to treat any soreness and discomfort from the procedure. Food intake: No eating limitations, unless stipulated above.  Nevertheless, if you have had sedation, you may experience some nausea.  In this case, it may be wise to wait at least two hours prior to resuming regular diet. Physical activities: Keep activities to a minimum for the first 8 hours after the procedure. For the first 24 hours after the procedure, do not drive a motor vehicle,  Operate heavy machinery, power tools, or handle any weapons.  Consider walking with the use of an assistive device or accompanied by an adult for the first 24 hours.  Do not drink alcoholic beverages including beer.  Do not make any important decisions or sign any legal documents. Go home and rest today.  Resume activities tomorrow, as tolerated.  Use caution in moving about as you may experience mild leg weakness.  Use caution in cooking, use of household electrical appliances and climbing steps. Driving: If you have received any sedation, you are not allowed to drive for 24 hours after your procedure. Blood thinner: Restart your blood thinner 6 hours after your procedure. (Only  for those taking blood thinners) Insulin: As soon as you can eat, you may resume your normal dosing schedule. (Only for those taking insulin) Medications: May resume pre-procedure medications.  Do not take any drugs, other than what has been prescribed to you. Infection prevention: Keep procedure site clean and dry. Post-procedure Pain Diary: Extremely important that this be done correctly and accurately. Recorded information will be used to determine the next step in treatment. Pain evaluated is that of treated area only. Do not include pain from an untreated area. Complete every hour, on the hour, for the initial 8 hours. Set an alarm to help you do this part accurately. Do not go to sleep and have it completed later. It will not be accurate. Follow-up appointment: Keep your follow-up appointment after the procedure. Usually 2-6 weeks after radiofrequency. Bring you pain diary. The information collected will be essential for your long-term care.   Expect: From numbing medicine (AKA: Local Anesthetics): Numbness or decrease in pain. Onset: Full effect within 15 minutes of injected. Duration: It will depend on the type of local anesthetic used. On the average, 1 to 8 hours.  From steroids (when added): Decrease in swelling or inflammation. Once inflammation is improved, relief of the pain will follow. Onset of benefits: Depends on the amount of swelling present. The more swelling, the longer it will take for the benefits to be seen. In some cases, up to 10 days. Duration: Steroids will stay in the system x 2 weeks. Duration of benefits will depend on multiple posibilities including persistent irritating factors. From procedure: Some discomfort is to be expected once the numbing medicine wears off. In the case of radiofrequency procedures,  this may last as long as 6 weeks. Additional post-procedure pain medication is provided for this. Discomfort is minimized if ice and heat are applied as  instructed.  Call if: You experience numbness and weakness that gets worse with time, as opposed to wearing off. He experience any unusual bleeding, difficulty breathing, or loss of the ability to control your bowel and bladder. (This applies to Spinal procedures only) You experience any redness, swelling, heat, red streaks, elevated temperature, fever, or any other signs of a possible infection.  Emergency Numbers: [place] hours (Monday - Thursday, 8:00 AM - 4:00 PM) (Friday, 9:00 AM - 12:00 Noon): [phone] After hours: [phone] ____________________________________________________________________________________________  ______________________________________________________________________  Preparing for Procedure with Sedation  NOTICE: Due to recent regulatory changes, starting on October 16, 2020, procedures requiring intravenous (IV) sedation will no longer be performed at the [place].  These types of procedures are required to be performed at [facility] ambulatory surgery facility.  We are very sorry for the inconvenience.  Procedure appointments are limited to planned procedures: No Prescription Refills. No disability issues will be discussed. No medication changes will be discussed.  Instructions: Oral Intake: Do not eat or drink anything for at least 8 hours prior to your procedure. (Exception: Blood Pressure Medication. See below.) Transportation: A driver is required. You may not drive yourself after the procedure. Blood Pressure Medicine: Do not forget to take your blood pressure medicine with a sip of water the morning of the procedure. If your Diastolic (lower reading) is above 100 mmHg, elective cases will be cancelled/rescheduled. Blood thinners: These will need to be stopped for procedures. Notify our staff if you are taking any blood thinners. Depending on which one you take, there will be specific instructions on how and when to stop  it. Diabetics on insulin: Notify the staff so that you can be scheduled 1st case in the morning. If your diabetes requires high dose insulin, take only  of your normal insulin dose the morning of the procedure and notify the staff that you have done so. Preventing infections: Shower with an antibacterial soap the morning of your procedure. Build-up your immune system: Take 1000 mg of Vitamin C with every meal (3 times a day) the day prior to your procedure. Antibiotics: Inform the staff if you have a condition or reason that requires you to take antibiotics before dental procedures. Pregnancy: If you are pregnant, call and cancel the procedure. Sickness: If you have a cold, fever, or any active infections, call and cancel the procedure. Arrival: You must be in the facility at least 30 minutes prior to your scheduled procedure. Children: Do not bring children with you. Dress appropriately: Bring dark clothing that you would not mind if they get stained. Valuables: Do not bring any jewelry or valuables.  Reasons to call and reschedule or cancel your procedure: (Following these recommendations will minimize the risk of a serious complication.) Surgeries: Avoid having procedures within 2 weeks of any surgery. (Avoid for 2 weeks before or after any surgery). Flu Shots: Avoid having procedures within 2 weeks of a flu shots. (Avoid for 2 weeks before or after immunizations). Barium: Avoid having a procedure within 7-10 days after having had a radiological study involving the use of radiological contrast. (Myelograms, Barium swallow or enema study). Heart attacks: Avoid any elective procedures or surgeries for the initial 6 months after a "Myocardial Infarction" (Heart Attack). Blood thinners: It is imperative that you stop these medications before procedures. Let  us know if you if you take any blood thinner.  Infection: Avoid procedures during or within two weeks of an infection (including chest colds or  gastrointestinal problems). Symptoms associated with infections include: Localized redness, fever, chills, night sweats or profuse sweating, burning sensation when voiding, cough, congestion, stuffiness, runny nose, sore throat, diarrhea, nausea, vomiting, cold or Flu symptoms, recent or current infections. It is specially important if the infection is over the area that we intend to treat. Heart and lung problems: Symptoms that may suggest an active cardiopulmonary problem include: cough, chest pain, breathing difficulties or shortness of breath, dizziness, ankle swelling, uncontrolled high or unusually low blood pressure, and/or palpitations. If you are experiencing any of these symptoms, cancel your procedure and contact your primary care physician for an evaluation.  Remember:  Regular Business hours are:  Monday to Thursday 8:00 AM to 4:00 PM  Provider's Schedule: Milinda Pointer, MD:  Procedure days: Tuesday and Thursday 7:30 AM to 4:00 PM  Gillis Santa, MD:  Procedure days: Monday and Wednesday 7:30 AM to 4:00 PM ______________________________________________________________________  ____________________________________________________________________________________________  General Risks and Possible Complications  Patient Responsibilities: It is important that you read this as it is part of your informed consent. It is our duty to inform you of the risks and possible complications associated with treatments offered to you. It is your responsibility as a patient to read this and to ask questions about anything that is not clear or that you believe was not covered in this document.  Patients Rights: You have the right to refuse treatment. You also have the right to change your mind, even after initially having agreed to have the treatment done. However, under this last option, if you wait until the last second to change your mind, you may be charged for the materials used up to that  point.  Introduction: Medicine is not an Chief Strategy Officer. Everything in Medicine, including the lack of treatment(s), carries the potential for danger, harm, or loss (which is by definition: Risk). In Medicine, a complication is a secondary problem, condition, or disease that can aggravate an already existing one. All treatments carry the risk of possible complications. The fact that a side effects or complications occurs, does not imply that the treatment was conducted incorrectly. It must be clearly understood that these can happen even when everything is done following the highest safety standards.  No treatment: You can choose not to proceed with the proposed treatment alternative. The PRO(s) would include: avoiding the risk of complications associated with the therapy. The CON(s) would include: not getting any of the treatment benefits. These benefits fall under one of three categories: diagnostic; therapeutic; and/or palliative. Diagnostic benefits include: getting information which can ultimately lead to improvement of the disease or symptom(s). Therapeutic benefits are those associated with the successful treatment of the disease. Finally, palliative benefits are those related to the decrease of the primary symptoms, without necessarily curing the condition (example: decreasing the pain from a flare-up of a chronic condition, such as incurable terminal cancer).  General Risks and Complications: These are associated to most interventional treatments. They can occur alone, or in combination. They fall under one of the following six (6) categories: no benefit or worsening of symptoms; bleeding; infection; nerve damage; allergic reactions; and/or death. No benefits or worsening of symptoms: In Medicine there are no guarantees, only probabilities. No healthcare provider can ever guarantee that a medical treatment will work, they can only state the probability that it may. Furthermore,  there is always the  possibility that the condition may worsen, either directly, or indirectly, as a consequence of the treatment. Bleeding: This is more common if the patient is taking a blood thinner, either prescription or over the counter (example: Goody Powders, Fish oil, Aspirin, Garlic, etc.), or if suffering a condition associated with impaired coagulation (example: Hemophilia, cirrhosis of the liver, low platelet counts, etc.). However, even if you do not have one on these, it can still happen. If you have any of these conditions, or take one of these drugs, make sure to notify your treating physician. Infection: This is more common in patients with a compromised immune system, either due to disease (example: diabetes, cancer, human immunodeficiency virus [HIV], etc.), or due to medications or treatments (example: therapies used to treat cancer and rheumatological diseases). However, even if you do not have one on these, it can still happen. If you have any of these conditions, or take one of these drugs, make sure to notify your treating physician. Nerve Damage: This is more common when the treatment is an invasive one, but it can also happen with the use of medications, such as those used in the treatment of cancer. The damage can occur to small secondary nerves, or to large primary ones, such as those in the spinal cord and brain. This damage may be temporary or permanent and it may lead to impairments that can range from temporary numbness to permanent paralysis and/or brain death. Allergic Reactions: Any time a substance or material comes in contact with our body, there is the possibility of an allergic reaction. These can range from a mild skin rash (contact dermatitis) to a severe systemic reaction (anaphylactic reaction), which can result in death. Death: In general, any medical intervention can result in death, most of the time due to an unforeseen  complication. ____________________________________________________________________________________________ ____________________________________________________________________________________________  Medication Rules  Purpose: To inform patients, and their family members, of our rules and regulations.  Applies to: All patients receiving prescriptions (written or electronic).  Pharmacy of record: Pharmacy where electronic prescriptions will be sent. If written prescriptions are taken to a different pharmacy, please inform the nursing staff. The pharmacy listed in the electronic medical record should be the one where you would like electronic prescriptions to be sent.  Electronic prescriptions: In compliance with the [place] (STOP) Act of 2017 (Session Lanny [place]amp [id number]), effective March 18, 2018, all controlled substances must be electronically prescribed. Calling prescriptions to the pharmacy will cease to exist.  Prescription refills: Only during scheduled appointments. Applies to all prescriptions.  NOTE: The following applies primarily to controlled substances (Opioid* Pain Medications).   Type of encounter (visit): For patients receiving controlled substances, face-to-face visits are required. (Not an option or up to the patient.)  Patient's responsibilities: Pain Pills: Bring all pain pills to every appointment (except for procedure appointments). Pill Bottles: Bring pills in original pharmacy bottle. Always bring the newest bottle. Bring bottle, even if empty. Medication refills: You are responsible for knowing and keeping track of what medications you take and those you need refilled. The day before your appointment: write a list of all prescriptions that need to be refilled. The day of the appointment: give the list to the admitting nurse. Prescriptions will be written only during appointments. No prescriptions will be written on procedure  days. If you forget a medication: it will not be "Called in", "Faxed", or "electronically sent". You will need to get another appointment to get  these prescribed. No early refills. Do not call asking to have your prescription filled early. Prescription Accuracy: You are responsible for carefully inspecting your prescriptions before leaving our office. Have the discharge nurse carefully go over each prescription with you, before taking them home. Make sure that your name is accurately spelled, that your address is correct. Check the name and dose of your medication to make sure it is accurate. Check the number of pills, and the written instructions to make sure they are clear and accurate. Make sure that you are given enough medication to last until your next medication refill appointment. Taking Medication: Take medication as prescribed. When it comes to controlled substances, taking less pills or less frequently than prescribed is permitted and encouraged. Never take more pills than instructed. Never take medication more frequently than prescribed.  Inform other Doctors: Always inform, all of your healthcare providers, of all the medications you take. Pain Medication from other Providers: You are not allowed to accept any additional pain medication from any other Doctor or Healthcare provider. There are two exceptions to this rule. (see below) In the event that you require additional pain medication, you are responsible for notifying us, as stated below. Cough Medicine: Often these contain an opioid, such as codeine or hydrocodone. Never accept or take cough medicine containing these opioids if you are already taking an opioid* medication. The combination may cause respiratory failure and death. Medication Agreement: You are responsible for carefully reading and following our Medication Agreement. This must be signed before receiving any prescriptions from our practice. Safely store a copy of your signed  Agreement. Violations to the Agreement will result in no further prescriptions. (Additional copies of our Medication Agreement are available upon request.) Laws, Rules, & Regulations: All patients are expected to follow all Federal and [employer], [employer], Rules, [employer]. Ignorance of the Laws does not constitute a valid excuse.  Illegal drugs and Controlled Substances: The use of illegal substances (including, but not limited to marijuana and its derivatives) and/or the illegal use of any controlled substances is strictly prohibited. Violation of this rule may result in the immediate and permanent discontinuation of any and all prescriptions being written by our practice. The use of any illegal substances is prohibited. Adopted CDC guidelines & recommendations: Target dosing levels will be at or below 60 MME/day. Use of benzodiazepines** is not recommended.  Exceptions: There are only two exceptions to the rule of not receiving pain medications from other Healthcare Providers. Exception #1 (Emergencies): In the event of an emergency (i.e.: accident requiring emergency care), you are allowed to receive additional pain medication. However, you are responsible for: As soon as you are able, call our office [phone], at any time of the day or night, and leave a message stating your name, the date and nature of the emergency, and the name and dose of the medication prescribed. In the event that your call is answered by a member of our staff, make sure to document and save the date, time, and the name of the person that took your information.  Exception #2 (Planned Surgery): In the event that you are scheduled by another doctor or dentist to have any type of surgery or procedure, you are allowed (for a period no longer than 30 days), to receive additional pain medication, for the acute post-op pain. However, in this case, you are responsible for picking up a copy of our "Post-op Pain Management for  Surgeons" handout, and giving it to  your surgeon or dentist. This document is available at our office, and does not require an appointment to obtain it. Simply go to our office during business hours (Monday-Thursday from 8:00 AM to 4:00 PM) (Friday 8:00 AM to 12:00 Noon) or if you have a scheduled appointment with [place], prior to your surgery, and ask for it by name. In addition, you are responsible for: calling our office [phone], at any time of the day or night, and leaving a message stating your name, name of your surgeon, type of surgery, and date of procedure or surgery. Failure to comply with your responsibilities may result in termination of therapy involving the controlled substances. Medication Agreement Violation. Following the above rules, including your responsibilities will help you in avoiding a Medication Agreement Violation (Breaking your Pain Medication Contract).  *Opioid medications include: morphine, codeine, oxycodone, oxymorphone, hydrocodone, hydromorphone, meperidine, tramadol, tapentadol, buprenorphine, fentanyl, methadone. **Benzodiazepine medications include: diazepam (Valium), alprazolam (Xanax), clonazepam (Klonopine), lorazepam (Ativan), clorazepate (Tranxene), chlordiazepoxide (Librium), estazolam (Prosom), oxazepam (Serax), temazepam (Restoril), triazolam (Halcion) (Last updated: 12/13/2020) ____________________________________________________________________________________________  ____________________________________________________________________________________________  Medication Recommendations and Reminders  Applies to: All patients receiving prescriptions (written and/or electronic).  Medication Rules & Regulations: These rules and regulations exist for your safety and that of others. They are not flexible and neither are we. Dismissing or ignoring them will be considered "non-compliance" with medication therapy, resulting in complete and irreversible  termination of such therapy. (See document titled "Medication Rules" for more details.) In all conscience, because of safety reasons, we cannot continue providing a therapy where the patient does not follow instructions.  Pharmacy of record:  Definition: This is the pharmacy where your electronic prescriptions will be sent.  We do not endorse any particular pharmacy, however, we have experienced problems with [facility] not securing enough medication supply for the community. We do not restrict you in your choice of pharmacy. However, once we write for your prescriptions, we will NOT be re-sending more prescriptions to fix restricted supply problems created by your pharmacy, or your insurance.  The pharmacy listed in the electronic medical record should be the one where you want electronic prescriptions to be sent. If you choose to change pharmacy, simply notify our nursing staff.  Recommendations: Keep all of your pain medications in a safe place, under lock and key, even if you live alone. We will NOT replace lost, stolen, or damaged medication. After you fill your prescription, take 1 week's worth of pills and put them away in a safe place. You should keep a separate, properly labeled bottle for this purpose. The remainder should be kept in the original bottle. Use this as your primary supply, until it runs out. Once it's gone, then you know that you have 1 week's worth of medicine, and it is time to come in for a prescription refill. If you do this correctly, it is unlikely that you will ever run out of medicine. To make sure that the above recommendation works, it is very important that you make sure your medication refill appointments are scheduled at least 1 week before you run out of medicine. To do this in an effective manner, make sure that you do not leave the office without scheduling your next medication management appointment. Always ask the nursing staff to show you in your prescription ,  when your medication will be running out. Then arrange for the receptionist to get you a return appointment, at least 7 days before you run out of medicine. Do not wait  until you have 1 or 2 pills left, to come in. This is very poor planning and does not take into consideration that we may need to cancel appointments due to bad weather, sickness, or emergencies affecting our staff. DO NOT ACCEPT A "Partial Fill": If for any reason your pharmacy does not have enough pills/tablets to completely fill or refill your prescription, do not allow for a "partial fill". The law allows the pharmacy to complete that prescription within 72 hours, without requiring a new prescription. If they do not fill the rest of your prescription within those 72 hours, you will need a separate prescription to fill the remaining amount, which we will NOT provide. If the reason for the partial fill is your insurance, you will need to talk to the pharmacist about payment alternatives for the remaining tablets, but again, DO NOT ACCEPT A PARTIAL FILL, unless you can trust your pharmacist to obtain the remainder of the pills within 72 hours.  Prescription refills and/or changes in medication(s):  Prescription refills, and/or changes in dose or medication, will be conducted only during scheduled medication management appointments. (Applies to both, written and electronic prescriptions.) No refills on procedure days. No medication will be changed or started on procedure days. No changes, adjustments, and/or refills will be conducted on a procedure day. Doing so will interfere with the diagnostic portion of the procedure. No phone refills. No medications will be "called into the pharmacy". No Fax refills. No weekend refills. No Holliday refills. No after hours refills.  Remember:  Business hours are:  Monday to Thursday 8:00 AM to 4:00 PM Provider's Schedule: Milinda Pointer, MD - Appointments are:  Medication management: Monday  and Wednesday 8:00 AM to 4:00 PM Procedure day: Tuesday and Thursday 7:30 AM to 4:00 PM Gillis Santa, MD - Appointments are:  Medication management: Tuesday and Thursday 8:00 AM to 4:00 PM Procedure day: Monday and Wednesday 7:30 AM to 4:00 PM (Last update: 10/06/2019) ____________________________________________________________________________________________  ____________________________________________________________________________________________  CBD (cannabidiol) & Delta-8 (Delta-8 tetrahydrocannabinol) WARNING  Intro: Cannabidiol (CBD) and tetrahydrocannabinol (THC), are two natural compounds found in plants of the Cannabis genus. They can both be extracted from hemp or cannabis. Hemp and cannabis come from the Cannabis sativa plant. Both compounds interact with your bodys endocannabinoid system, but they have very different effects. CBD does not produce the high sensation associated with cannabis. Delta-8 tetrahydrocannabinol, also known as delta-8 THC, is a psychoactive substance found in the Cannabis sativa plant, of which marijuana and hemp are two varieties. THC is responsible for the high associated with the illicit use of marijuana.  Applicable to: All individuals currently taking or considering taking CBD (cannabidiol) and, more important, all patients taking opioid analgesic controlled substances (pain medication). (Example: oxycodone; oxymorphone; hydrocodone; hydromorphone; morphine; methadone; tramadol; tapentadol; fentanyl; buprenorphine; butorphanol; dextromethorphan; meperidine; codeine; etc.)  Legal status: CBD remains a Schedule I drug prohibited for any use. CBD is illegal with one exception. In the [place], CBD has a limited Transport planner (FDA) approval for the treatment of two specific types of epilepsy disorders. Only one CBD product has been approved by the FDA for this purpose: "Epidiolex". FDA is aware that some companies are marketing products  containing cannabis and cannabis-derived compounds in ways that violate the [employer], Drug and Cosmetic Act [facility] Act) and that may put the health and safety of consumers at risk. The FDA, a Federal agency, has not enforced the CBD status since 2018.   Legality: Some manufacturers ship  CBD products nationally, which is illegal. Often such products are sold online and are therefore available throughout the country. CBD is openly sold in head shops and health food stores in some states where such sales have not been explicitly legalized. Selling unapproved products with unsubstantiated therapeutic claims is not only a violation of the law, but also can put patients at risk, as these products have not been proven to be safe or effective. Federal illegality makes it difficult to conduct research on CBD.  Reference: "FDA Regulation of Cannabis and Cannabis-Derived Products, Including Cannabidiol (CBD)" - [employer]  Warning: CBD is not FDA approved and has not undergo the same manufacturing controls as prescription drugs.  This means that the purity and safety of available CBD may be questionable. Most of the time, despite manufacturer's claims, it is contaminated with THC (delta-9-tetrahydrocannabinol - the chemical in marijuana responsible for the "HIGH").  When this is the case, the [facility] contaminant will trigger a positive urine drug screen (UDS) test for Marijuana (carboxy-THC). Because a positive UDS for any illicit substance is a violation of our medication agreement, your opioid analgesics (pain medicine) may be permanently discontinued. The FDA recently put out a warning about 5 things that everyone should be aware of regarding Delta-8 THC: Delta-8 THC products have not been evaluated or approved by the FDA for safe use and may be marketed in ways that put the public health at  risk. The FDA has received adverse event reports involving delta-8 THC-containing products. Delta-8 THC has psychoactive and intoxicating effects. Delta-8 THC manufacturing often involve use of potentially harmful chemicals to create the concentrations of delta-8 THC claimed in the marketplace. The final delta-8 THC product may have potentially harmful by-products (contaminants) due to the chemicals used in the process. Manufacturing of delta-8 THC products may occur in uncontrolled or unsanitary settings, which may lead to the presence of unsafe contaminants or other potentially harmful substances. Delta-8 THC products should be kept out of the reach of children and pets.  MORE ABOUT CBD  General Information: CBD was discovered in 8 and it is a derivative of the cannabis sativa genus plants (Marijuana and Hemp). It is one of the 113 identified substances found in Marijuana. It accounts for up to 40% of the plant's extract. As of 2018, preliminary clinical studies on CBD included research for the treatment of anxiety, movement disorders, and pain. CBD is available and consumed in multiple forms, including inhalation of smoke or vapor, as an aerosol spray, and by mouth. It may be supplied as an oil containing CBD, capsules, dried cannabis, or as a liquid solution. CBD is thought not to be as psychoactive as THC (delta-9-tetrahydrocannabinol - the chemical in marijuana responsible for the "HIGH"). Studies suggest that CBD may interact with different biological target receptors in the body, including cannabinoid and other neurotransmitter receptors. As of 2018 the mechanism of action for its biological effects has not been determined.  Side-effects   Adverse reactions: Dry mouth, diarrhea, decreased appetite, fatigue, drowsiness, malaise, weakness, sleep disturbances, and others.  Drug interactions: CBC may interact with other medications such as blood-thinners. (Last update:  12/15/2020) ____________________________________________________________________________________________

## 2021-03-30 ENCOUNTER — Other Ambulatory Visit (HOSPITAL_COMMUNITY): Payer: Self-pay

## 2021-03-30 ENCOUNTER — Telehealth: Payer: Self-pay

## 2021-03-30 NOTE — Telephone Encounter (Signed)
Post procedure phone call.  Patient states he is doing good.  

## 2021-04-06 ENCOUNTER — Other Ambulatory Visit (HOSPITAL_COMMUNITY): Payer: Self-pay

## 2021-04-11 ENCOUNTER — Other Ambulatory Visit (HOSPITAL_COMMUNITY): Payer: Self-pay

## 2021-04-11 ENCOUNTER — Other Ambulatory Visit: Payer: Self-pay | Admitting: Adult Health

## 2021-04-12 ENCOUNTER — Other Ambulatory Visit (HOSPITAL_COMMUNITY): Payer: Self-pay

## 2021-04-12 MED ORDER — ALPRAZOLAM 0.5 MG PO TABS
0.5000 mg | ORAL_TABLET | Freq: Two times a day (BID) | ORAL | 2 refills | Status: DC
  Filled 2021-04-12 – 2021-05-31 (×2): qty 60, 30d supply, fill #0
  Filled 2021-07-08: qty 60, 30d supply, fill #1

## 2021-04-12 NOTE — Telephone Encounter (Signed)
Last filled 12/05/2020 Last Ov 10/12/2020  Ok to fill?

## 2021-04-13 ENCOUNTER — Other Ambulatory Visit (HOSPITAL_COMMUNITY): Payer: Self-pay

## 2021-04-19 ENCOUNTER — Other Ambulatory Visit: Payer: Self-pay | Admitting: Adult Health

## 2021-04-20 ENCOUNTER — Other Ambulatory Visit: Payer: Self-pay

## 2021-04-20 ENCOUNTER — Other Ambulatory Visit: Payer: Self-pay | Admitting: Adult Health

## 2021-04-20 ENCOUNTER — Other Ambulatory Visit (HOSPITAL_COMMUNITY): Payer: Self-pay

## 2021-04-20 NOTE — Telephone Encounter (Signed)
OK to fill?   It say [facility]

## 2021-04-23 ENCOUNTER — Other Ambulatory Visit (HOSPITAL_COMMUNITY): Payer: Self-pay

## 2021-05-01 ENCOUNTER — Other Ambulatory Visit (HOSPITAL_BASED_OUTPATIENT_CLINIC_OR_DEPARTMENT_OTHER): Payer: Self-pay

## 2021-05-02 ENCOUNTER — Other Ambulatory Visit (HOSPITAL_COMMUNITY): Payer: Self-pay

## 2021-05-06 ENCOUNTER — Other Ambulatory Visit: Payer: Self-pay | Admitting: Adult Health

## 2021-05-07 ENCOUNTER — Other Ambulatory Visit (HOSPITAL_COMMUNITY): Payer: Self-pay

## 2021-05-07 MED ORDER — PANTOPRAZOLE SODIUM 40 MG PO TBEC
40.0000 mg | DELAYED_RELEASE_TABLET | Freq: Every day | ORAL | 0 refills | Status: DC
  Filled 2021-05-07: qty 15, 15d supply, fill #0

## 2021-05-08 ENCOUNTER — Other Ambulatory Visit (HOSPITAL_BASED_OUTPATIENT_CLINIC_OR_DEPARTMENT_OTHER): Payer: Self-pay

## 2021-05-08 ENCOUNTER — Other Ambulatory Visit: Payer: Self-pay | Admitting: Adult Health

## 2021-05-08 DIAGNOSIS — M792 Neuralgia and neuritis, unspecified: Secondary | ICD-10-CM

## 2021-05-09 ENCOUNTER — Other Ambulatory Visit (HOSPITAL_BASED_OUTPATIENT_CLINIC_OR_DEPARTMENT_OTHER): Payer: Self-pay

## 2021-05-09 MED ORDER — FENOFIBRATE 145 MG PO TABS
145.0000 mg | ORAL_TABLET | Freq: Every day | ORAL | 0 refills | Status: DC
  Filled 2021-05-09: qty 90, 90d supply, fill #0

## 2021-05-09 MED ORDER — PREGABALIN 225 MG PO CAPS
225.0000 mg | ORAL_CAPSULE | Freq: Two times a day (BID) | ORAL | 2 refills | Status: DC
  Filled 2021-05-09: qty 60, 30d supply, fill #0
  Filled 2021-07-08: qty 60, 30d supply, fill #1
  Filled 2021-07-15 – 2021-09-11 (×2): qty 60, 30d supply, fill #2

## 2021-05-09 NOTE — Telephone Encounter (Signed)
Okay for refill?    LOV 10/12/2020 CPE   Last Refill    10/12/2020     60 QTY.   2 Refills

## 2021-05-10 ENCOUNTER — Ambulatory Visit: Payer: 59 | Admitting: Pain Medicine

## 2021-05-10 ENCOUNTER — Other Ambulatory Visit (HOSPITAL_BASED_OUTPATIENT_CLINIC_OR_DEPARTMENT_OTHER): Payer: Self-pay

## 2021-05-14 ENCOUNTER — Other Ambulatory Visit (HOSPITAL_COMMUNITY): Payer: Self-pay

## 2021-05-16 NOTE — Progress Notes (Deleted)
No-show to RFA ?

## 2021-05-17 ENCOUNTER — Other Ambulatory Visit: Payer: Self-pay | Admitting: Pain Medicine

## 2021-05-17 ENCOUNTER — Ambulatory Visit: Payer: 59 | Admitting: Pain Medicine

## 2021-05-17 DIAGNOSIS — Z91199 Patient's noncompliance with other medical treatment and regimen due to unspecified reason: Secondary | ICD-10-CM

## 2021-05-17 NOTE — Progress Notes (Addendum)
(  05/17/2021) This patient has failed to keep visits either by "no-show" or "cancellation" on 12/18/2020.  In addition, the patient has failed to keep appointments for procedures on 01/30/2021, 02/06/2021, and 05/17/2021.  Unfortunately, we cannot keep this trend as these are appointments that could have been used by other patients in need. ?

## 2021-05-28 ENCOUNTER — Other Ambulatory Visit: Payer: Self-pay | Admitting: Adult Health

## 2021-05-28 ENCOUNTER — Other Ambulatory Visit (HOSPITAL_BASED_OUTPATIENT_CLINIC_OR_DEPARTMENT_OTHER): Payer: Self-pay

## 2021-05-29 ENCOUNTER — Other Ambulatory Visit (HOSPITAL_BASED_OUTPATIENT_CLINIC_OR_DEPARTMENT_OTHER): Payer: Self-pay

## 2021-05-29 MED ORDER — PANTOPRAZOLE SODIUM 40 MG PO TBEC
40.0000 mg | DELAYED_RELEASE_TABLET | Freq: Every day | ORAL | 0 refills | Status: DC
  Filled 2021-05-29: qty 15, 15d supply, fill #0

## 2021-06-01 ENCOUNTER — Other Ambulatory Visit (HOSPITAL_BASED_OUTPATIENT_CLINIC_OR_DEPARTMENT_OTHER): Payer: Self-pay

## 2021-06-10 ENCOUNTER — Other Ambulatory Visit (HOSPITAL_BASED_OUTPATIENT_CLINIC_OR_DEPARTMENT_OTHER): Payer: Self-pay

## 2021-06-11 ENCOUNTER — Other Ambulatory Visit (HOSPITAL_BASED_OUTPATIENT_CLINIC_OR_DEPARTMENT_OTHER): Payer: Self-pay

## 2021-06-11 MED ORDER — TESTOSTERONE CYPIONATE 200 MG/ML IM SOLN
INTRAMUSCULAR | 1 refills | Status: DC
  Filled 2021-06-11: qty 1, 7d supply, fill #0
  Filled 2021-08-13: qty 1, 7d supply, fill #1

## 2021-06-12 ENCOUNTER — Other Ambulatory Visit (HOSPITAL_COMMUNITY): Payer: Self-pay

## 2021-06-25 ENCOUNTER — Other Ambulatory Visit: Payer: Self-pay | Admitting: Adult Health

## 2021-06-25 ENCOUNTER — Other Ambulatory Visit (HOSPITAL_BASED_OUTPATIENT_CLINIC_OR_DEPARTMENT_OTHER): Payer: Self-pay

## 2021-06-26 ENCOUNTER — Other Ambulatory Visit (HOSPITAL_BASED_OUTPATIENT_CLINIC_OR_DEPARTMENT_OTHER): Payer: Self-pay

## 2021-06-26 ENCOUNTER — Other Ambulatory Visit: Payer: Self-pay | Admitting: Adult Health

## 2021-06-27 ENCOUNTER — Other Ambulatory Visit (HOSPITAL_BASED_OUTPATIENT_CLINIC_OR_DEPARTMENT_OTHER): Payer: Self-pay

## 2021-06-27 MED ORDER — PANTOPRAZOLE SODIUM 40 MG PO TBEC
40.0000 mg | DELAYED_RELEASE_TABLET | Freq: Every day | ORAL | 1 refills | Status: DC
  Filled 2021-06-27: qty 90, 90d supply, fill #0
  Filled 2021-09-21: qty 90, 90d supply, fill #1

## 2021-07-08 ENCOUNTER — Other Ambulatory Visit: Payer: Self-pay | Admitting: Endocrinology

## 2021-07-09 ENCOUNTER — Other Ambulatory Visit (HOSPITAL_BASED_OUTPATIENT_CLINIC_OR_DEPARTMENT_OTHER): Payer: Self-pay

## 2021-07-09 MED ORDER — LEVOTHYROXINE SODIUM 175 MCG PO TABS
175.0000 ug | ORAL_TABLET | Freq: Every day | ORAL | 2 refills | Status: DC
  Filled 2021-07-09 – 2021-07-15 (×2): qty 90, 90d supply, fill #0
  Filled 2021-11-06: qty 90, 90d supply, fill #1
  Filled 2021-12-30 – 2022-02-12 (×3): qty 90, 90d supply, fill #2

## 2021-07-13 ENCOUNTER — Other Ambulatory Visit (HOSPITAL_COMMUNITY): Payer: Self-pay

## 2021-07-13 ENCOUNTER — Other Ambulatory Visit: Payer: Self-pay | Admitting: Pain Medicine

## 2021-07-13 DIAGNOSIS — M5137 Other intervertebral disc degeneration, lumbosacral region: Secondary | ICD-10-CM

## 2021-07-13 DIAGNOSIS — M545 Low back pain, unspecified: Secondary | ICD-10-CM

## 2021-07-13 DIAGNOSIS — Z79891 Long term (current) use of opiate analgesic: Secondary | ICD-10-CM

## 2021-07-13 DIAGNOSIS — M961 Postlaminectomy syndrome, not elsewhere classified: Secondary | ICD-10-CM

## 2021-07-13 DIAGNOSIS — M47816 Spondylosis without myelopathy or radiculopathy, lumbar region: Secondary | ICD-10-CM

## 2021-07-13 DIAGNOSIS — Z79899 Other long term (current) drug therapy: Secondary | ICD-10-CM

## 2021-07-13 DIAGNOSIS — G894 Chronic pain syndrome: Secondary | ICD-10-CM

## 2021-07-13 DIAGNOSIS — G8929 Other chronic pain: Secondary | ICD-10-CM

## 2021-07-15 ENCOUNTER — Other Ambulatory Visit: Payer: Self-pay | Admitting: Adult Health

## 2021-07-15 DIAGNOSIS — F419 Anxiety disorder, unspecified: Secondary | ICD-10-CM

## 2021-07-16 ENCOUNTER — Other Ambulatory Visit (HOSPITAL_BASED_OUTPATIENT_CLINIC_OR_DEPARTMENT_OTHER): Payer: Self-pay

## 2021-07-17 ENCOUNTER — Other Ambulatory Visit (HOSPITAL_COMMUNITY): Payer: Self-pay

## 2021-07-17 ENCOUNTER — Other Ambulatory Visit (HOSPITAL_BASED_OUTPATIENT_CLINIC_OR_DEPARTMENT_OTHER): Payer: Self-pay

## 2021-07-17 MED ORDER — SERTRALINE HCL 100 MG PO TABS
200.0000 mg | ORAL_TABLET | Freq: Every day | ORAL | 1 refills | Status: DC
  Filled 2021-07-17 – 2021-10-02 (×4): qty 180, 90d supply, fill #0
  Filled 2021-10-11: qty 41, 20d supply, fill #0
  Filled 2021-10-11: qty 139, 70d supply, fill #0
  Filled 2021-12-30: qty 180, 90d supply, fill #1

## 2021-07-19 ENCOUNTER — Other Ambulatory Visit (HOSPITAL_COMMUNITY): Payer: Self-pay

## 2021-07-19 ENCOUNTER — Other Ambulatory Visit: Payer: Self-pay | Admitting: Pain Medicine

## 2021-07-19 DIAGNOSIS — G894 Chronic pain syndrome: Secondary | ICD-10-CM

## 2021-07-19 DIAGNOSIS — Z79891 Long term (current) use of opiate analgesic: Secondary | ICD-10-CM

## 2021-07-19 DIAGNOSIS — M47816 Spondylosis without myelopathy or radiculopathy, lumbar region: Secondary | ICD-10-CM

## 2021-07-19 DIAGNOSIS — Z79899 Other long term (current) drug therapy: Secondary | ICD-10-CM

## 2021-07-19 DIAGNOSIS — M961 Postlaminectomy syndrome, not elsewhere classified: Secondary | ICD-10-CM

## 2021-07-19 DIAGNOSIS — M5137 Other intervertebral disc degeneration, lumbosacral region: Secondary | ICD-10-CM

## 2021-07-19 DIAGNOSIS — M545 Low back pain, unspecified: Secondary | ICD-10-CM

## 2021-07-19 DIAGNOSIS — G8929 Other chronic pain: Secondary | ICD-10-CM

## 2021-07-20 ENCOUNTER — Other Ambulatory Visit (HOSPITAL_COMMUNITY): Payer: Self-pay

## 2021-07-23 ENCOUNTER — Other Ambulatory Visit (HOSPITAL_COMMUNITY): Payer: Self-pay

## 2021-07-23 ENCOUNTER — Other Ambulatory Visit: Payer: Self-pay | Admitting: Pain Medicine

## 2021-07-23 DIAGNOSIS — M47816 Spondylosis without myelopathy or radiculopathy, lumbar region: Secondary | ICD-10-CM

## 2021-07-23 DIAGNOSIS — G894 Chronic pain syndrome: Secondary | ICD-10-CM

## 2021-07-23 DIAGNOSIS — Z79891 Long term (current) use of opiate analgesic: Secondary | ICD-10-CM

## 2021-07-23 DIAGNOSIS — M5137 Other intervertebral disc degeneration, lumbosacral region: Secondary | ICD-10-CM

## 2021-07-23 DIAGNOSIS — Z79899 Other long term (current) drug therapy: Secondary | ICD-10-CM

## 2021-07-23 DIAGNOSIS — M545 Low back pain, unspecified: Secondary | ICD-10-CM

## 2021-07-23 DIAGNOSIS — G8929 Other chronic pain: Secondary | ICD-10-CM

## 2021-07-23 DIAGNOSIS — M961 Postlaminectomy syndrome, not elsewhere classified: Secondary | ICD-10-CM

## 2021-07-27 ENCOUNTER — Other Ambulatory Visit (HOSPITAL_COMMUNITY): Payer: Self-pay

## 2021-08-01 ENCOUNTER — Encounter: Payer: Self-pay | Admitting: Adult Health

## 2021-08-01 ENCOUNTER — Other Ambulatory Visit (HOSPITAL_COMMUNITY): Payer: Self-pay

## 2021-08-01 ENCOUNTER — Other Ambulatory Visit: Payer: Self-pay | Admitting: Adult Health

## 2021-08-01 DIAGNOSIS — Z79891 Long term (current) use of opiate analgesic: Secondary | ICD-10-CM

## 2021-08-01 DIAGNOSIS — M545 Low back pain, unspecified: Secondary | ICD-10-CM

## 2021-08-01 DIAGNOSIS — Z79899 Other long term (current) drug therapy: Secondary | ICD-10-CM

## 2021-08-01 DIAGNOSIS — M47816 Spondylosis without myelopathy or radiculopathy, lumbar region: Secondary | ICD-10-CM

## 2021-08-01 DIAGNOSIS — M961 Postlaminectomy syndrome, not elsewhere classified: Secondary | ICD-10-CM

## 2021-08-01 DIAGNOSIS — M51379 Other intervertebral disc degeneration, lumbosacral region without mention of lumbar back pain or lower extremity pain: Secondary | ICD-10-CM

## 2021-08-01 DIAGNOSIS — G8929 Other chronic pain: Secondary | ICD-10-CM

## 2021-08-01 DIAGNOSIS — M5137 Other intervertebral disc degeneration, lumbosacral region: Secondary | ICD-10-CM

## 2021-08-01 DIAGNOSIS — G894 Chronic pain syndrome: Secondary | ICD-10-CM

## 2021-08-01 MED ORDER — HYDROCODONE-ACETAMINOPHEN 5-325 MG PO TABS
1.0000 | ORAL_TABLET | Freq: Three times a day (TID) | ORAL | 0 refills | Status: DC | PRN
  Filled 2021-08-01: qty 90, 30d supply, fill #0

## 2021-08-01 NOTE — Telephone Encounter (Signed)
Please advise 

## 2021-08-02 ENCOUNTER — Other Ambulatory Visit (HOSPITAL_BASED_OUTPATIENT_CLINIC_OR_DEPARTMENT_OTHER): Payer: Self-pay

## 2021-08-02 ENCOUNTER — Other Ambulatory Visit: Payer: Self-pay | Admitting: Adult Health

## 2021-08-03 ENCOUNTER — Other Ambulatory Visit (HOSPITAL_BASED_OUTPATIENT_CLINIC_OR_DEPARTMENT_OTHER): Payer: Self-pay

## 2021-08-03 MED ORDER — ALPRAZOLAM 0.5 MG PO TABS
0.5000 mg | ORAL_TABLET | Freq: Two times a day (BID) | ORAL | 2 refills | Status: DC
  Filled 2021-08-03 – 2021-08-06 (×2): qty 60, 30d supply, fill #0
  Filled 2021-09-11: qty 60, 30d supply, fill #1
  Filled 2021-10-11: qty 60, 30d supply, fill #2

## 2021-08-03 NOTE — Telephone Encounter (Signed)
Okay for refill?    LOV 10/06/2020 Pt due for CPE in 2 months   Last Refill       ALPRAZolam (XANAX) 0.5 MG tablet 60 tablet 2 04/12/2021   Sig:   Take 1 tablet (0.5 mg total) by mouth 2 (two) times daily.

## 2021-08-06 ENCOUNTER — Other Ambulatory Visit (HOSPITAL_BASED_OUTPATIENT_CLINIC_OR_DEPARTMENT_OTHER): Payer: Self-pay

## 2021-08-07 DIAGNOSIS — M545 Low back pain, unspecified: Secondary | ICD-10-CM | POA: Diagnosis not present

## 2021-08-07 DIAGNOSIS — Z79891 Long term (current) use of opiate analgesic: Secondary | ICD-10-CM | POA: Diagnosis not present

## 2021-08-14 ENCOUNTER — Other Ambulatory Visit (HOSPITAL_BASED_OUTPATIENT_CLINIC_OR_DEPARTMENT_OTHER): Payer: Self-pay

## 2021-08-16 ENCOUNTER — Encounter (HOSPITAL_BASED_OUTPATIENT_CLINIC_OR_DEPARTMENT_OTHER): Payer: Self-pay | Admitting: Nurse Practitioner

## 2021-08-17 ENCOUNTER — Other Ambulatory Visit (HOSPITAL_BASED_OUTPATIENT_CLINIC_OR_DEPARTMENT_OTHER): Payer: Self-pay

## 2021-08-17 ENCOUNTER — Other Ambulatory Visit (HOSPITAL_BASED_OUTPATIENT_CLINIC_OR_DEPARTMENT_OTHER): Payer: Self-pay | Admitting: Nurse Practitioner

## 2021-08-17 DIAGNOSIS — G894 Chronic pain syndrome: Secondary | ICD-10-CM

## 2021-08-17 DIAGNOSIS — M7918 Myalgia, other site: Secondary | ICD-10-CM

## 2021-08-17 MED ORDER — PREDNISONE 10 MG PO TABS
ORAL_TABLET | ORAL | 0 refills | Status: DC
  Filled 2021-08-17: qty 21, 6d supply, fill #0

## 2021-08-17 MED ORDER — TIZANIDINE HCL 2 MG PO TABS
2.0000 mg | ORAL_TABLET | Freq: Every day | ORAL | 2 refills | Status: AC
  Filled 2021-08-17: qty 60, 30d supply, fill #0
  Filled 2021-09-11: qty 60, 30d supply, fill #1
  Filled 2021-11-06: qty 60, 30d supply, fill #2

## 2021-08-19 DIAGNOSIS — F1120 Opioid dependence, uncomplicated: Secondary | ICD-10-CM | POA: Diagnosis not present

## 2021-08-19 DIAGNOSIS — F32A Depression, unspecified: Secondary | ICD-10-CM | POA: Diagnosis not present

## 2021-08-19 DIAGNOSIS — Z79899 Other long term (current) drug therapy: Secondary | ICD-10-CM | POA: Diagnosis not present

## 2021-08-19 DIAGNOSIS — I10 Essential (primary) hypertension: Secondary | ICD-10-CM | POA: Diagnosis not present

## 2021-08-19 DIAGNOSIS — G8929 Other chronic pain: Secondary | ICD-10-CM | POA: Diagnosis not present

## 2021-08-19 DIAGNOSIS — E0500 Thyrotoxicosis with diffuse goiter without thyrotoxic crisis or storm: Secondary | ICD-10-CM | POA: Diagnosis not present

## 2021-08-19 DIAGNOSIS — M549 Dorsalgia, unspecified: Secondary | ICD-10-CM | POA: Diagnosis not present

## 2021-08-23 ENCOUNTER — Other Ambulatory Visit (HOSPITAL_BASED_OUTPATIENT_CLINIC_OR_DEPARTMENT_OTHER): Payer: Self-pay | Admitting: Nurse Practitioner

## 2021-08-23 ENCOUNTER — Other Ambulatory Visit (HOSPITAL_BASED_OUTPATIENT_CLINIC_OR_DEPARTMENT_OTHER): Payer: Self-pay

## 2021-08-23 DIAGNOSIS — M5126 Other intervertebral disc displacement, lumbar region: Secondary | ICD-10-CM

## 2021-08-23 DIAGNOSIS — M961 Postlaminectomy syndrome, not elsewhere classified: Secondary | ICD-10-CM

## 2021-08-23 DIAGNOSIS — G894 Chronic pain syndrome: Secondary | ICD-10-CM

## 2021-08-23 DIAGNOSIS — M47816 Spondylosis without myelopathy or radiculopathy, lumbar region: Secondary | ICD-10-CM

## 2021-08-23 DIAGNOSIS — Z79891 Long term (current) use of opiate analgesic: Secondary | ICD-10-CM

## 2021-08-23 DIAGNOSIS — M24151 Other articular cartilage disorders, right hip: Secondary | ICD-10-CM

## 2021-08-23 DIAGNOSIS — M5137 Other intervertebral disc degeneration, lumbosacral region: Secondary | ICD-10-CM

## 2021-08-23 DIAGNOSIS — M545 Other chronic pain: Secondary | ICD-10-CM

## 2021-08-23 DIAGNOSIS — M5416 Radiculopathy, lumbar region: Secondary | ICD-10-CM

## 2021-08-23 DIAGNOSIS — M7061 Trochanteric bursitis, right hip: Secondary | ICD-10-CM

## 2021-08-23 DIAGNOSIS — G8929 Other chronic pain: Secondary | ICD-10-CM

## 2021-08-23 DIAGNOSIS — Z79899 Other long term (current) drug therapy: Secondary | ICD-10-CM

## 2021-08-23 MED ORDER — HYDROCODONE-ACETAMINOPHEN 10-325 MG PO TABS
1.0000 | ORAL_TABLET | Freq: Three times a day (TID) | ORAL | 0 refills | Status: DC | PRN
  Filled 2021-08-23: qty 45, 15d supply, fill #0

## 2021-08-23 MED ORDER — HYDROCODONE-ACETAMINOPHEN 5-325 MG PO TABS
1.0000 | ORAL_TABLET | Freq: Three times a day (TID) | ORAL | 0 refills | Status: DC | PRN
  Filled 2021-08-23: qty 90, 30d supply, fill #0

## 2021-09-11 ENCOUNTER — Other Ambulatory Visit: Payer: Self-pay | Admitting: Adult Health

## 2021-09-11 ENCOUNTER — Other Ambulatory Visit (HOSPITAL_BASED_OUTPATIENT_CLINIC_OR_DEPARTMENT_OTHER): Payer: Self-pay

## 2021-09-12 ENCOUNTER — Other Ambulatory Visit (HOSPITAL_BASED_OUTPATIENT_CLINIC_OR_DEPARTMENT_OTHER): Payer: Self-pay

## 2021-09-12 MED ORDER — FENOFIBRATE 145 MG PO TABS
145.0000 mg | ORAL_TABLET | Freq: Every day | ORAL | 0 refills | Status: DC
  Filled 2021-09-12: qty 90, 90d supply, fill #0

## 2021-09-13 ENCOUNTER — Other Ambulatory Visit (HOSPITAL_BASED_OUTPATIENT_CLINIC_OR_DEPARTMENT_OTHER): Payer: Self-pay

## 2021-09-20 ENCOUNTER — Other Ambulatory Visit (HOSPITAL_BASED_OUTPATIENT_CLINIC_OR_DEPARTMENT_OTHER): Payer: Self-pay

## 2021-09-20 ENCOUNTER — Other Ambulatory Visit (HOSPITAL_BASED_OUTPATIENT_CLINIC_OR_DEPARTMENT_OTHER): Payer: Self-pay | Admitting: Nurse Practitioner

## 2021-09-20 DIAGNOSIS — B356 Tinea cruris: Secondary | ICD-10-CM

## 2021-09-20 DIAGNOSIS — L304 Erythema intertrigo: Secondary | ICD-10-CM

## 2021-09-20 MED ORDER — NYSTATIN-TRIAMCINOLONE 100000-0.1 UNIT/GM-% EX OINT
1.0000 | TOPICAL_OINTMENT | Freq: Two times a day (BID) | CUTANEOUS | 2 refills | Status: AC
  Filled 2021-09-20: qty 60, 30d supply, fill #0
  Filled 2021-12-04: qty 60, 30d supply, fill #1
  Filled 2022-02-11: qty 60, 30d supply, fill #2

## 2021-09-21 ENCOUNTER — Other Ambulatory Visit (HOSPITAL_BASED_OUTPATIENT_CLINIC_OR_DEPARTMENT_OTHER): Payer: Self-pay

## 2021-09-27 ENCOUNTER — Ambulatory Visit (INDEPENDENT_AMBULATORY_CARE_PROVIDER_SITE_OTHER): Payer: 59 | Admitting: Orthopaedic Surgery

## 2021-09-27 DIAGNOSIS — M5416 Radiculopathy, lumbar region: Secondary | ICD-10-CM | POA: Diagnosis not present

## 2021-09-27 NOTE — Progress Notes (Signed)
Chief Complaint: Lower back pain     History of Present Illness:    Chad Wilson is a 46 y.o. male presents with 7 years of chronic lower back pain that does radiate down the right leg.  Does have a history of cervical disc disease which was previously underwent a cervical discectomy.  He had an MRI done in July 2022 which did show evidence of lumbar disc disease.  He endorses that the right leg is beginning to feel weak and he has developed somewhat of a foot drop.  States this felt similar to what was occurring with the right hand before he underwent a cervical discectomy.  He does have a history of rheumatoid arthritis.  MRI was performed of his lumbar spine and he was referred to orthopedics for further assessment.  He has previously had an injection in his back with minimal relief.  He endorses numbness along the side and anterior thigh    Surgical History:   As above  PMH/PSH/Family History/Social History/Meds/Allergies:    Past Medical History:  Diagnosis Date   Acute hepatitis C without mention of hepatic coma(070.51)    has been cleared    Acute postoperative pain 12/10/2018   Alcohol abuse, unspecified    Anxiety and depression    Anxiety states    Essential hypertension, benign    GERD (gastroesophageal reflux disease)    Graves disease    treated by Dr Loanne Drilling   Panic disorder without agoraphobia    Thyroid disease    Graves Disease   Tobacco use disorder    Past Surgical History:  Procedure Laterality Date   ANTERIOR CERVICAL DECOMP/DISCECTOMY FUSION N/A 05/21/2017   Procedure: Anterior Cervical Decompression Fusion Cervical five-six, Cervical six-seven;  Surgeon: Eustace Moore, MD;  Location: [place];  Service: Neurosurgery;  Laterality: N/A;   MULTIPLE TOOTH EXTRACTIONS  15 yrs. ago   WISDOM TOOTH EXTRACTION     Social History   Socioeconomic History   Marital status: Married    Spouse name: Not on file   Number of children:  0   Years of education: Not on file   Highest education level: Not on file  Occupational History    Employer: [employer]  Tobacco Use   Smoking status: Every Day    Packs/day: 1.00    Years: 30.00    Total pack years: 30.00    Types: Cigarettes   Smokeless tobacco: Current    Types: Chew  Vaping Use   Vaping Use: Never used  Substance and Sexual Activity   Alcohol use: Yes    Alcohol/week: 42.0 standard drinks of alcohol    Types: 42 Cans of beer per week    Comment: 6 pack of beer daily   Drug use: Not Currently    Comment: hx of cocaine  last dose 7 yrs ago   Sexual activity: Not on file  Other Topics Concern   Not on file  Social History Narrative   He is working in Architect    Diet: Fruits and veggies, water poweraid, occ sweet tea   Social Determinants of Radio broadcast assistant Strain: Not on file  Food Insecurity: Not on file  Transportation Needs: Not on file  Physical Activity: Not on file  Stress: Not on file  Social  Connections: Not on file   Family History  Problem Relation Age of Onset   Anxiety disorder Father    Heart disease Father    Hypertension Father    Thyroid disease Mother    Anxiety disorder Sister    Hyperthyroidism Sister    Colon cancer Neg Hx    Esophageal cancer Neg Hx    Pancreatic cancer Neg Hx    Allergies  Allergen Reactions   Chantix [Varenicline] Other (See Comments)    Agitation and sleep walking    Current Outpatient Medications  Medication Sig Dispense Refill   albuterol (VENTOLIN HFA) 108 (90 Base) MCG/ACT inhaler INHALE 2 PUFFS BY MOUTH EVERY 6 HOURS AS NEEDED. **DUE FOR YEARLY PHYSICAL** 8.5 g 0   ALPRAZolam (XANAX) 0.5 MG tablet Take 1 tablet (0.5 mg total) by mouth 2 (two) times daily. 60 tablet 2   atorvastatin (LIPITOR) 40 MG tablet Take 1 tablet (40 mg total) by mouth at bedtime. 90 tablet 3   busPIRone (BUSPAR) 5 MG tablet Take 1 tablet (5 mg total) by mouth 2 (two) times daily. 180 tablet  0   Colchicine 0.6 MG CAPS Take 1 tablet by mouth 2 (two) times daily as needed. 60 capsule 3   fenofibrate (TRICOR) 145 MG tablet Take 1 tablet (145 mg total) by mouth daily. 90 tablet 0   HYDROcodone-acetaminophen (NORCO) 10-325 MG tablet Take 1 tablet by mouth every 8 (eight) hours as needed. 45 tablet 0   levothyroxine (SYNTHROID) 175 MCG tablet Take 1 tablet (175 mcg total) by mouth daily before breakfast. 90 tablet 2   lisinopril (ZESTRIL) 40 MG tablet Take 1 tablet (40 mg total) by mouth daily. 90 tablet 3   nabumetone (RELAFEN) 750 MG tablet Take 1 tablet (750 mg total) by mouth 2 (two) times daily as needed. 60 tablet 6   nitroGLYCERIN (NITRO-DUR) 0.1 mg/hr patch Apply 1/4 patch to affected area 12 hours daily 30 patch 3   nystatin-triamcinolone ointment (MYCOLOG) Apply 1 Application topically 2 (two) times daily. 60 g 2   pantoprazole (PROTONIX) 40 MG tablet Take 1 tablet (40 mg total) by mouth daily. 90 tablet 1   pregabalin (LYRICA) 225 MG capsule Take 1 capsule (225 mg total) by mouth 2 (two) times daily. 60 capsule 2   sertraline (ZOLOFT) 100 MG tablet Take 2 tablets (200 mg total) by mouth daily. 180 tablet 1   sildenafil (REVATIO) 20 MG tablet Take 1-5 tablets (20-100 mg total) by mouth as needed 90 tablet 0   testosterone cypionate (DEPOTESTOTERONE CYPIONATE) 100 MG/ML injection Inject 1 mL (100 mg total) into the muscle once a week. 5 mL 1   tiZANidine (ZANAFLEX) 2 MG tablet Take 1-2 tablets (2-4 mg total) by mouth at bedtime. 60 tablet 2   No current facility-administered medications for this visit.   No results found.  Review of Systems:   A ROS was performed including pertinent positives and negatives as documented in the HPI.  Physical Exam :   Constitutional: NAD and appears stated age Neurological: Alert and oriented Psych: Appropriate affect and cooperative There were no vitals taken for this visit.   Comprehensive Musculoskeletal Exam:    Tenderness palpation  about the paraspinal muscles of the back.  There is endorse numbness over the anterior lateral thigh.  He does have some weakness with resisted knee extension.  Negative straight leg raise  Imaging:     MRI (lumbar spine): There is a right-sided disc herniation particularly at the L3-L4  level that I do believe may be causing nerve impingement  I personally reviewed and interpreted the radiographs.   Assessment:   46 y.o. male with right lower back pain with radiation down the leg in the setting of a right L3-L4 lumbar disc herniation.  At this time given the fact that injections do not help him and I do believe that surgical consultation to discuss the role for possible discectomy could be of use.  We will plan to make referral to the neurosurgical team  Plan :    -Return to clinic as needed     I personally saw and evaluated the patient, and participated in the management and treatment plan.  Vanetta Mulders, MD Attending Physician, Orthopedic Surgery  This document was dictated using Dragon voice recognition software. A reasonable attempt at proof reading has been made to minimize errors.

## 2021-10-02 ENCOUNTER — Other Ambulatory Visit: Payer: Self-pay

## 2021-10-02 ENCOUNTER — Other Ambulatory Visit (HOSPITAL_COMMUNITY): Payer: Self-pay

## 2021-10-02 ENCOUNTER — Encounter (HOSPITAL_BASED_OUTPATIENT_CLINIC_OR_DEPARTMENT_OTHER): Payer: Self-pay

## 2021-10-02 ENCOUNTER — Emergency Department (HOSPITAL_BASED_OUTPATIENT_CLINIC_OR_DEPARTMENT_OTHER): Payer: 59 | Admitting: Radiology

## 2021-10-02 DIAGNOSIS — W260XXA Contact with knife, initial encounter: Secondary | ICD-10-CM | POA: Insufficient documentation

## 2021-10-02 DIAGNOSIS — Y990 Civilian activity done for income or pay: Secondary | ICD-10-CM | POA: Diagnosis not present

## 2021-10-02 DIAGNOSIS — S61411A Laceration without foreign body of right hand, initial encounter: Secondary | ICD-10-CM | POA: Insufficient documentation

## 2021-10-02 DIAGNOSIS — Z23 Encounter for immunization: Secondary | ICD-10-CM | POA: Insufficient documentation

## 2021-10-02 DIAGNOSIS — S6991XA Unspecified injury of right wrist, hand and finger(s), initial encounter: Secondary | ICD-10-CM | POA: Diagnosis present

## 2021-10-02 NOTE — ED Triage Notes (Signed)
Patient here POV from Home.  Endorses Laceration sustained to Right Distal Anterior Hand. Approximately 4 cm in Length. Occurred approximately 4-5 Hours ago. Lacerated against Blade while at Work.  Tetanus Updated 2014.  NAD Noted during Triage. A&Ox4. GCS 15. Ambulatory.

## 2021-10-03 ENCOUNTER — Other Ambulatory Visit (HOSPITAL_BASED_OUTPATIENT_CLINIC_OR_DEPARTMENT_OTHER): Payer: Self-pay

## 2021-10-03 ENCOUNTER — Other Ambulatory Visit (HOSPITAL_COMMUNITY): Payer: Self-pay

## 2021-10-03 ENCOUNTER — Emergency Department (HOSPITAL_BASED_OUTPATIENT_CLINIC_OR_DEPARTMENT_OTHER)
Admission: EM | Admit: 2021-10-03 | Discharge: 2021-10-03 | Disposition: A | Discharge status: Home / Self Care | Payer: 59 | Attending: Emergency Medicine | Admitting: Emergency Medicine

## 2021-10-03 DIAGNOSIS — S61411A Laceration without foreign body of right hand, initial encounter: Secondary | ICD-10-CM

## 2021-10-03 DIAGNOSIS — Z23 Encounter for immunization: Secondary | ICD-10-CM | POA: Diagnosis not present

## 2021-10-03 MED ORDER — TETANUS-DIPHTH-ACELL PERTUSSIS 5-2.5-18.5 LF-MCG/0.5 IM SUSY
0.5000 mL | PREFILLED_SYRINGE | Freq: Once | INTRAMUSCULAR | Status: AC
  Administered 2021-10-03: 0.5 mL via INTRAMUSCULAR
  Filled 2021-10-03: qty 0.5

## 2021-10-03 MED ORDER — LIDOCAINE HCL (PF) 1 % IJ SOLN
5.0000 mL | Freq: Once | INTRAMUSCULAR | Status: AC
  Administered 2021-10-03: 5 mL via INTRADERMAL
  Filled 2021-10-03: qty 5

## 2021-10-03 MED ORDER — HYDROCODONE-ACETAMINOPHEN 5-325 MG PO TABS
1.0000 | ORAL_TABLET | Freq: Four times a day (QID) | ORAL | 0 refills | Status: DC | PRN
Start: 2021-10-03 — End: 2021-11-22
  Filled 2021-10-03: qty 12, 2d supply, fill #0

## 2021-10-03 MED ORDER — CEPHALEXIN 250 MG PO CAPS
500.0000 mg | ORAL_CAPSULE | Freq: Once | ORAL | Status: AC
  Administered 2021-10-03: 500 mg via ORAL
  Filled 2021-10-03: qty 2

## 2021-10-03 MED ORDER — CEPHALEXIN 500 MG PO CAPS
500.0000 mg | ORAL_CAPSULE | Freq: Four times a day (QID) | ORAL | 0 refills | Status: DC
  Filled 2021-10-03: qty 20, 5d supply, fill #0

## 2021-10-03 NOTE — ED Notes (Signed)
Pt awake and alert - GCS 15 with spouse at bedside.   R hand palmar region laceration irrigated with 500cc sterile saline at this time; well tolerated by patient -- small amount of bleeding noted before and after irrigation.  Site re-dressed with Telfa and 4x4 then wrapped with cobane - RUE distal neurovascular status remains intact

## 2021-10-03 NOTE — Discharge Instructions (Signed)
Local wound care with bacitracin and dressing changes twice daily.  Keep wound clean and dry.  Sutures are to be removed in 7 days.  Please follow-up with your primary doctor for this.  Return to the ER if you develop redness surrounding the wound, pus draining from the wound, streaks extending up or down the arm, or for other new and concerning symptoms.

## 2021-10-03 NOTE — ED Notes (Signed)
Provider at bedside; now suturing hand lac

## 2021-10-03 NOTE — ED Notes (Signed)
R hand wound care performed post suture placement -- dried blood cleansed from R hand with 1/2 strength hydrogen peroxide and 1/2 sterile water - site then dried with 4x4s and re-dressed with Telfa dressing and 4x4 - wrapped with coban

## 2021-10-03 NOTE — ED Notes (Signed)
Pt agreeable with d/c plan as discussed by provider- this nurse has verbally reinforced d/c instructions and provided pt with written copy- pt acknowledges verbal understanding and denies any additional questions, concerns, need- ambulatory independently at d/c with steady gait; no distress; vitals stable.

## 2021-10-03 NOTE — ED Provider Notes (Signed)
[facility] EMERGENCY DEPT Provider Note   CSN: [id number] Arrival date & time: 10/02/21  2017     History  Chief Complaint  Patient presents with   Laceration    Chad Wilson is a 46 y.o. male.  Patient is a 46 year old male with history of chronic back pain.  Patient presented today with complaints of a right hand laceration.  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  He has a laceration to the hand near the base of the palmar aspect of the right fourth and fifth fingers.  Bleeding controlled with direct pressure.  Last tetanus 2014.  The history is provided by the patient.       Home Medications Prior to Admission medications   Medication Sig Start Date End Date Taking? Authorizing Provider  albuterol (VENTOLIN HFA) 108 (90 Base) MCG/ACT inhaler INHALE 2 PUFFS BY MOUTH EVERY 6 HOURS AS NEEDED. **DUE FOR YEARLY PHYSICAL** 07/28/19   Nafziger, Tommi Rumps, NP  ALPRAZolam Duanne Moron) 0.5 MG tablet Take 1 tablet (0.5 mg total) by mouth 2 (two) times daily. 08/03/21   Nafziger, Tommi Rumps, NP  atorvastatin (LIPITOR) 40 MG tablet Take 1 tablet (40 mg total) by mouth at bedtime. 10/12/20   Nafziger, Tommi Rumps, NP  busPIRone (BUSPAR) 5 MG tablet Take 1 tablet (5 mg total) by mouth 2 (two) times daily. 06/28/20   Nafziger, Tommi Rumps, NP  Colchicine 0.6 MG CAPS Take 1 tablet by mouth 2 (two) times daily as needed. 06/21/19   Hilts, Legrand Como, MD  fenofibrate (TRICOR) 145 MG tablet Take 1 tablet (145 mg total) by mouth daily. 09/12/21   Nafziger, Tommi Rumps, NP  HYDROcodone-acetaminophen (NORCO) 10-325 MG tablet Take 1 tablet by mouth every 8 (eight) hours as needed. 08/23/21   Orma Render, NP  levothyroxine (SYNTHROID) 175 MCG tablet Take 1 tablet (175 mcg total) by mouth daily before breakfast. 07/09/21   Renato Shin, MD  lisinopril (ZESTRIL) 40 MG tablet Take 1 tablet (40 mg total) by mouth daily. 10/12/20   Nafziger, Tommi Rumps, NP  nabumetone (RELAFEN) 750 MG tablet  Take 1 tablet (750 mg total) by mouth 2 (two) times daily as needed. 09/17/19   Hilts, Legrand Como, MD  nitroGLYCERIN (NITRO-DUR) 0.1 mg/hr patch Apply 1/4 patch to affected area 12 hours daily 09/17/19   Hilts, Michael, MD  nystatin-triamcinolone ointment (MYCOLOG) Apply 1 Application topically 2 (two) times daily. 09/20/21   Orma Render, NP  pantoprazole (PROTONIX) 40 MG tablet Take 1 tablet (40 mg total) by mouth daily. 06/27/21   Nafziger, Tommi Rumps, NP  pregabalin (LYRICA) 225 MG capsule Take 1 capsule (225 mg total) by mouth 2 (two) times daily. 05/09/21   Nafziger, Tommi Rumps, NP  sertraline (ZOLOFT) 100 MG tablet Take 2 tablets (200 mg total) by mouth daily. 07/17/21   Nafziger, Tommi Rumps, NP  sildenafil (REVATIO) 20 MG tablet Take 1-5 tablets (20-100 mg total) by mouth as needed 03/05/21     testosterone cypionate (DEPOTESTOTERONE CYPIONATE) 100 MG/ML injection Inject 1 mL (100 mg total) into the muscle once a week. 09/06/20     tiZANidine (ZANAFLEX) 2 MG tablet Take 1-2 tablets (2-4 mg total) by mouth at bedtime. 08/17/21   Orma Render, NP  omeprazole (PRILOSEC) 20 MG capsule Take 1 capsule (20 mg total) by mouth daily. 12/24/10 05/27/11  Owens Loffler, MD      Allergies    Chantix [varenicline]    Review of Systems   Review of  Systems  All other systems reviewed and are negative.   Physical Exam Updated Vital Signs BP (!) 153/81 (BP Location: Right Arm)   Pulse 62   Temp (!) 97 F (36.1 C) (Temporal)   Resp 16   Ht 5' 6"  (1.676 m)   Wt 97.1 kg   SpO2 97%   BMI 34.55 kg/m  Physical Exam Vitals and nursing note reviewed.  Constitutional:      General: He is not in acute distress.    Appearance: Normal appearance. He is not ill-appearing.  HENT:     Head: Normocephalic and atraumatic.  Pulmonary:     Effort: Pulmonary effort is normal.  Musculoskeletal:     Comments: There is a 3.5 cm laceration to the palmar aspect of the right hand.  The laceration is located transverse just below the fourth  and fifth MCP joints.  There is no apparent tendon involvement.  He has good range of motion of the fingers.  Skin:    General: Skin is warm and dry.  Neurological:     Mental Status: He is alert.     ED Results / Procedures / Treatments   Labs (all labs ordered are listed, but only abnormal results are displayed) Labs Reviewed - No data to display  EKG None  Radiology DG Hand Complete Right  Result Date: 10/02/2021 CLINICAL DATA:  Laceration EXAM: RIGHT HAND - COMPLETE 3+ VIEW COMPARISON:  None Available. FINDINGS: No fracture or malalignment. Metallic ring artifact at the fifth digit. No radiopaque foreign body within the soft tissues. IMPRESSION: No acute osseous abnormality Electronically Signed   By: Donavan Foil M.D.   On: 10/02/2021 21:21    Procedures Procedures    Medications Ordered in ED Medications  Tdap (BOOSTRIX) injection 0.5 mL (has no administration in time range)  lidocaine (PF) (XYLOCAINE) 1 % injection 5 mL (has no administration in time range)    ED Course/ Medical Decision Making/ A&P  Patient presenting with a right hand laceration caused by a utility knife at work.  Wound was repaired as below.  Patient to be treated with local wound care and as needed follow-up.  The wound was open for a prolonged period of time and will be treated with Keflex.  LACERATION REPAIR Performed by: Veryl Speak Authorized by: Veryl Speak Consent: Verbal consent obtained. Risks and benefits: risks, benefits and alternatives were discussed Consent given by: patient Patient identity confirmed: provided demographic data Prepped and Draped in normal sterile fashion Wound explored  Laceration Location: Right hand  Laceration Length: 3.5 cm  No Foreign Bodies seen or palpated  Anesthesia: local infiltration  Local anesthetic: lidocaine 1% without epinephrine  Anesthetic total: 5 ml  Irrigation method: syringe Amount of cleaning: standard  Skin closure: 4-0  Ethilon  Number of sutures: 5  Technique: Simple interrupted  Patient tolerance: Patient tolerated the procedure well with no immediate complications.   Final Clinical Impression(s) / ED Diagnoses Final diagnoses:  None    Rx / DC Orders ED Discharge Orders     None         Veryl Speak, MD 10/03/21 0236

## 2021-10-04 ENCOUNTER — Other Ambulatory Visit (HOSPITAL_COMMUNITY): Payer: Self-pay

## 2021-10-05 ENCOUNTER — Other Ambulatory Visit (HOSPITAL_COMMUNITY): Payer: Self-pay

## 2021-10-10 ENCOUNTER — Other Ambulatory Visit (HOSPITAL_COMMUNITY): Payer: Self-pay

## 2021-10-10 ENCOUNTER — Ambulatory Visit (INDEPENDENT_AMBULATORY_CARE_PROVIDER_SITE_OTHER): Payer: 59 | Admitting: Adult Health

## 2021-10-10 ENCOUNTER — Encounter: Payer: Self-pay | Admitting: Adult Health

## 2021-10-10 VITALS — BP 138/80 | HR 91 | Temp 98.4°F | Ht 66.0 in | Wt 208.0 lb

## 2021-10-10 DIAGNOSIS — Z4802 Encounter for removal of sutures: Secondary | ICD-10-CM

## 2021-10-10 NOTE — Progress Notes (Signed)
Subjective:    Patient ID: Chad Wilson, male    DOB: Jan 07, 1976, 46 y.o.   MRN: [id number]  HPI 46 year old male who  has a past medical history of Acute hepatitis C without mention of hepatic coma(070.51), Acute postoperative pain (12/10/2018), Alcohol abuse, unspecified, Anxiety and depression, Anxiety states, Essential hypertension, benign, GERD (gastroesophageal reflux disease), Graves disease, Panic disorder without agoraphobia, Thyroid disease, and Tobacco use disorder.  He presents to the office today for suture removal.  He was seen in the emergency room 1 week ago after cutting his right hand with a utility knife while installing a floor.  His laceration was near the base of the palmar aspect of the right fourth and fifth fingers.  He had 5 sutures placed and his tetanus was updated.  Review of Systems See HPI   Past Medical History:  Diagnosis Date   Acute hepatitis C without mention of hepatic coma(070.51)    has been cleared    Acute postoperative pain 12/10/2018   Alcohol abuse, unspecified    Anxiety and depression    Anxiety states    Essential hypertension, benign    GERD (gastroesophageal reflux disease)    Graves disease    treated by Dr Loanne Drilling   Panic disorder without agoraphobia    Thyroid disease    Graves Disease   Tobacco use disorder     Social History   Socioeconomic History   Marital status: Married    Spouse name: Not on file   Number of children: 0   Years of education: Not on file   Highest education level: Not on file  Occupational History    Employer: [employer]  Tobacco Use   Smoking status: Every Day    Packs/day: 1.00    Years: 30.00    Total pack years: 30.00    Types: Cigarettes   Smokeless tobacco: Current    Types: Chew  Vaping Use   Vaping Use: Never used  Substance and Sexual Activity   Alcohol use: Yes    Alcohol/week: 42.0 standard drinks of alcohol    Types: 42 Cans of beer per week    Comment: 6 pack of  beer daily   Drug use: Not Currently    Comment: hx of cocaine  last dose 7 yrs ago   Sexual activity: Not on file  Other Topics Concern   Not on file  Social History Narrative   He is working in [employer]    Diet: Fruits and veggies, water poweraid, occ sweet tea   Social Determinants of Radio broadcast assistant Strain: Not on file  Food Insecurity: Not on file  Transportation Needs: Not on file  Physical Activity: Not on file  Stress: Not on file  Social Connections: Not on file  Intimate Partner Violence: Not on file    Past Surgical History:  Procedure Laterality Date   ANTERIOR CERVICAL DECOMP/DISCECTOMY FUSION N/A 05/21/2017   Procedure: Anterior Cervical Decompression Fusion Cervical five-six, Cervical six-seven;  Surgeon: Eustace Moore, MD;  Location: [facility];  Service: Neurosurgery;  Laterality: N/A;   MULTIPLE TOOTH EXTRACTIONS  15 yrs. ago   WISDOM TOOTH EXTRACTION      Family History  Problem Relation Age of Onset   Anxiety disorder Father    Heart disease Father    Hypertension Father    Thyroid disease Mother    Anxiety disorder Sister    Hyperthyroidism Sister    Colon cancer Neg  Hx    Esophageal cancer Neg Hx    Pancreatic cancer Neg Hx     Allergies  Allergen Reactions   Chantix [Varenicline] Other (See Comments)    Agitation and sleep walking     Current Outpatient Medications on File Prior to Visit  Medication Sig Dispense Refill   albuterol (VENTOLIN HFA) 108 (90 Base) MCG/ACT inhaler INHALE 2 PUFFS BY MOUTH EVERY 6 HOURS AS NEEDED. **DUE FOR YEARLY PHYSICAL** 8.5 g 0   ALPRAZolam (XANAX) 0.5 MG tablet Take 1 tablet (0.5 mg total) by mouth 2 (two) times daily. 60 tablet 2   atorvastatin (LIPITOR) 40 MG tablet Take 1 tablet (40 mg total) by mouth at bedtime. 90 tablet 3   busPIRone (BUSPAR) 5 MG tablet Take 1 tablet (5 mg total) by mouth 2 (two) times daily. 180 tablet 0   cephALEXin (KEFLEX) 500 MG capsule Take 1 capsule (500 mg total) by  mouth 4 (four) times daily. 20 capsule 0   Colchicine 0.6 MG CAPS Take 1 tablet by mouth 2 (two) times daily as needed. 60 capsule 3   fenofibrate (TRICOR) 145 MG tablet Take 1 tablet (145 mg total) by mouth daily. 90 tablet 0   HYDROcodone-acetaminophen (NORCO) 5-325 MG tablet Take 1-2 tablets by mouth every 6 (six) hours as needed. 12 tablet 0   levothyroxine (SYNTHROID) 175 MCG tablet Take 1 tablet (175 mcg total) by mouth daily before breakfast. 90 tablet 2   lisinopril (ZESTRIL) 40 MG tablet Take 1 tablet (40 mg total) by mouth daily. 90 tablet 3   nabumetone (RELAFEN) 750 MG tablet Take 1 tablet (750 mg total) by mouth 2 (two) times daily as needed. 60 tablet 6   nitroGLYCERIN (NITRO-DUR) 0.1 mg/hr patch Apply 1/4 patch to affected area 12 hours daily 30 patch 3   nystatin-triamcinolone ointment (MYCOLOG) Apply 1 Application topically 2 (two) times daily. 60 g 2   pantoprazole (PROTONIX) 40 MG tablet Take 1 tablet (40 mg total) by mouth daily. 90 tablet 1   pregabalin (LYRICA) 225 MG capsule Take 1 capsule (225 mg total) by mouth 2 (two) times daily. 60 capsule 2   sertraline (ZOLOFT) 100 MG tablet Take 2 tablets (200 mg total) by mouth daily. 180 tablet 1   sildenafil (REVATIO) 20 MG tablet Take 1-5 tablets (20-100 mg total) by mouth as needed 90 tablet 0   testosterone cypionate (DEPOTESTOTERONE CYPIONATE) 100 MG/ML injection Inject 1 mL (100 mg total) into the muscle once a week. 5 mL 1   tiZANidine (ZANAFLEX) 2 MG tablet Take 1-2 tablets (2-4 mg total) by mouth at bedtime. 60 tablet 2   [DISCONTINUED] omeprazole (PRILOSEC) 20 MG capsule Take 1 capsule (20 mg total) by mouth daily. 30 capsule 3   No current facility-administered medications on file prior to visit.    BP 138/80   Pulse 91   Temp 98.4 F (36.9 C) (Oral)   Ht 5' 6"  (1.676 m)   Wt 208 lb (94.3 kg)   SpO2 98%   BMI 33.57 kg/m       Objective:   Physical Exam Vitals and nursing note reviewed.  Constitutional:       Appearance: Normal appearance.  Skin:    General: Skin is warm and dry.     Comments: Well healed laceration on palmer aspect of right hand. No signs of infection. No dehiscence   Neurological:     General: No focal deficit present.     Mental Status: He is  alert and oriented to person, place, and time.  Psychiatric:        Mood and Affect: Mood normal.        Behavior: Behavior normal.        Thought Content: Thought content normal.        Judgment: Judgment normal.       Assessment & Plan:  1. Visit for suture removal -5 sutures were removed without difficulty.  Patient tolerated procedure well.  Encouraged to be careful with his hand for the next week.  Follow-up as needed.  Dorothyann Peng, NP

## 2021-10-11 ENCOUNTER — Other Ambulatory Visit (HOSPITAL_COMMUNITY): Payer: Self-pay

## 2021-10-11 ENCOUNTER — Other Ambulatory Visit (HOSPITAL_BASED_OUTPATIENT_CLINIC_OR_DEPARTMENT_OTHER): Payer: Self-pay

## 2021-10-12 ENCOUNTER — Other Ambulatory Visit (HOSPITAL_BASED_OUTPATIENT_CLINIC_OR_DEPARTMENT_OTHER): Payer: Self-pay

## 2021-10-12 ENCOUNTER — Other Ambulatory Visit (HOSPITAL_COMMUNITY): Payer: Self-pay

## 2021-10-12 MED ORDER — TESTOSTERONE CYPIONATE 200 MG/ML IM SOLN
INTRAMUSCULAR | 1 refills | Status: DC
  Filled 2021-10-12: qty 1, 7d supply, fill #0
  Filled 2021-11-06: qty 1, 7d supply, fill #1

## 2021-10-16 ENCOUNTER — Ambulatory Visit (INDEPENDENT_AMBULATORY_CARE_PROVIDER_SITE_OTHER): Payer: 59 | Admitting: Adult Health

## 2021-10-16 ENCOUNTER — Other Ambulatory Visit (HOSPITAL_BASED_OUTPATIENT_CLINIC_OR_DEPARTMENT_OTHER): Payer: Self-pay

## 2021-10-16 ENCOUNTER — Encounter: Payer: Self-pay | Admitting: Adult Health

## 2021-10-16 VITALS — BP 120/80 | HR 94 | Temp 98.2°F | Ht 67.5 in | Wt 208.0 lb

## 2021-10-16 DIAGNOSIS — M7918 Myalgia, other site: Secondary | ICD-10-CM | POA: Diagnosis not present

## 2021-10-16 DIAGNOSIS — G8929 Other chronic pain: Secondary | ICD-10-CM

## 2021-10-16 DIAGNOSIS — F32A Depression, unspecified: Secondary | ICD-10-CM

## 2021-10-16 DIAGNOSIS — Z720 Tobacco use: Secondary | ICD-10-CM

## 2021-10-16 DIAGNOSIS — E890 Postprocedural hypothyroidism: Secondary | ICD-10-CM

## 2021-10-16 DIAGNOSIS — Z125 Encounter for screening for malignant neoplasm of prostate: Secondary | ICD-10-CM | POA: Diagnosis not present

## 2021-10-16 DIAGNOSIS — R7989 Other specified abnormal findings of blood chemistry: Secondary | ICD-10-CM

## 2021-10-16 DIAGNOSIS — Z114 Encounter for screening for human immunodeficiency virus [HIV]: Secondary | ICD-10-CM

## 2021-10-16 DIAGNOSIS — Z0000 Encounter for general adult medical examination without abnormal findings: Secondary | ICD-10-CM | POA: Diagnosis not present

## 2021-10-16 DIAGNOSIS — I10 Essential (primary) hypertension: Secondary | ICD-10-CM | POA: Diagnosis not present

## 2021-10-16 DIAGNOSIS — E782 Mixed hyperlipidemia: Secondary | ICD-10-CM

## 2021-10-16 DIAGNOSIS — Z79891 Long term (current) use of opiate analgesic: Secondary | ICD-10-CM | POA: Diagnosis not present

## 2021-10-16 DIAGNOSIS — F419 Anxiety disorder, unspecified: Secondary | ICD-10-CM

## 2021-10-16 MED ORDER — PREGABALIN 225 MG PO CAPS
225.0000 mg | ORAL_CAPSULE | Freq: Two times a day (BID) | ORAL | 2 refills | Status: DC
  Filled 2021-10-16: qty 60, 30d supply, fill #0
  Filled 2021-12-04: qty 60, 30d supply, fill #1
  Filled 2021-12-30: qty 60, 30d supply, fill #2

## 2021-10-16 MED ORDER — ALPRAZOLAM 0.5 MG PO TABS
0.5000 mg | ORAL_TABLET | Freq: Two times a day (BID) | ORAL | 2 refills | Status: DC
  Filled 2021-10-16 – 2021-11-07 (×3): qty 60, 30d supply, fill #0
  Filled 2021-12-04: qty 10, 5d supply, fill #1
  Filled 2021-12-05: qty 50, 25d supply, fill #1
  Filled 2021-12-30: qty 60, 30d supply, fill #2

## 2021-10-16 MED ORDER — BUSPIRONE HCL 5 MG PO TABS
5.0000 mg | ORAL_TABLET | Freq: Two times a day (BID) | ORAL | 1 refills | Status: DC
  Filled 2021-10-16: qty 180, 90d supply, fill #0
  Filled 2021-12-30: qty 175, 88d supply, fill #1
  Filled 2022-05-07 – 2022-09-09 (×2): qty 175, 88d supply, fill #2

## 2021-10-16 NOTE — Progress Notes (Addendum)
Subjective:    Patient ID: Chad Wilson, male    DOB: Nov 28, 1975, 46 y.o.   MRN: [id number]  HPI Patient presents for yearly preventative medicine examination. He is a pleasant 46 year old male who  has a past medical history of Acute hepatitis C without mention of hepatic coma(070.51), Acute postoperative pain (12/10/2018), Alcohol abuse, unspecified, Anxiety and depression, Anxiety states, Essential hypertension, benign, GERD (gastroesophageal reflux disease), Graves disease, Panic disorder without agoraphobia, Thyroid disease, and Tobacco use disorder.  Graves' disease-is managed by endocrinology.  He has not had any recent changes in medication, continues to take Synthroid 175 mg daily  Anxiety/depression-takes Zoloft 200 mg daily, BuSpar 5 mg twice daily, and Klonopin 0.5 mg twice daily.  He does feel as though his symptoms are well controlled  Hyperlipidemia-managed with Lipitor 40 mg daily and fenofibrate.   He denies myalgia or fatigue Lab Results  Component Value Date   CHOL 142 10/12/2020   HDL 33.10 (L) 10/12/2020   LDLCALC 66 10/30/2016   LDLDIRECT 61.0 10/12/2020   TRIG (H) 10/12/2020    439.0 Triglyceride is over 400; calculations on Lipids are invalid.   CHOLHDL 4 10/12/2020    GERD-controlled with Protonix 40 mg daily  Chronic back pain-is managed by pain management.  Currently prescribed Norco 5-325 mg every 6 hours. He also is prescribed tizanidine 2-4 mg QHS and Lyrica 225 mg BID ( this is prescribed by [place])   Hypogonadism -managed by urology.  Does testosterone injections every 14 days  Tobacco use-continues to smoke.  He understands he needs to quit  Hypertension - managed with lisinopril 40 mg daily.   He denies dizziness, lightheadedness, blurred vision, or headaches BP Readings from Last 3 Encounters:  10/10/21 138/80  10/03/21 139/87  03/29/21 (!) 145/92     All immunizations and health maintenance protocols were reviewed with the patient and needed  orders were placed.  Appropriate screening laboratory values were ordered for the patient including screening of hyperlipidemia, renal function and hepatic function. If indicated by BPH, a PSA was ordered.  Medication reconciliation,  past medical history, social history, problem list and allergies were reviewed in detail with the patient  Goals were established with regard to weight loss, exercise, and  diet in compliance with medications Wt Readings from Last 3 Encounters:  10/10/21 208 lb (94.3 kg)  10/02/21 214 lb 1.1 oz (97.1 kg)  03/29/21 214 lb (97.1 kg)   He is up to date on vaccinations and colon cancer screening  Review of Systems  Constitutional: Negative.   HENT: Negative.    Eyes: Negative.   Respiratory: Negative.    Cardiovascular: Negative.   Gastrointestinal: Negative.   Endocrine: Negative.   Genitourinary: Negative.   Musculoskeletal:  Positive for arthralgias, back pain and myalgias.  Skin: Negative.   Allergic/Immunologic: Negative.   Neurological: Negative.   Hematological: Negative.   Psychiatric/Behavioral: Negative.    All other systems reviewed and are negative.  Past Medical History:  Diagnosis Date   Acute hepatitis C without mention of hepatic coma(070.51)    has been cleared    Acute postoperative pain 12/10/2018   Alcohol abuse, unspecified    Anxiety and depression    Anxiety states    Essential hypertension, benign    GERD (gastroesophageal reflux disease)    Graves disease    treated by Dr Everardo All   Panic disorder without agoraphobia    Thyroid disease    Graves Disease   Tobacco use  disorder     Social History   Socioeconomic History   Marital status: Married    Spouse name: Not on file   Number of children: 0   Years of education: Not on file   Highest education level: Not on file  Occupational History    Employer: [employer]  Tobacco Use   Smoking status: Every Day    Packs/day: 1.00    Years: 30.00     Total pack years: 30.00    Types: Cigarettes   Smokeless tobacco: Current    Types: Chew  Vaping Use   Vaping Use: Never used  Substance and Sexual Activity   Alcohol use: Yes    Alcohol/week: 42.0 standard drinks of alcohol    Types: 42 Cans of beer per week    Comment: 6 pack of beer daily   Drug use: Not Currently    Comment: hx of cocaine  last dose 7 yrs ago   Sexual activity: Not on file  Other Topics Concern   Not on file  Social History Narrative   He is working in Holiday representative    Diet: Fruits and veggies, water poweraid, occ sweet tea   Social Determinants of Corporate investment banker Strain: Not on file  Food Insecurity: Not on file  Transportation Needs: Not on file  Physical Activity: Not on file  Stress: Not on file  Social Connections: Not on file  Intimate Partner Violence: Not on file    Past Surgical History:  Procedure Laterality Date   ANTERIOR CERVICAL DECOMP/DISCECTOMY FUSION N/A 05/21/2017   Procedure: Anterior Cervical Decompression Fusion Cervical five-six, Cervical six-seven;  Surgeon: Tia Alert, MD;  Location: [facility] OR;  Service: Neurosurgery;  Laterality: N/A;   MULTIPLE TOOTH EXTRACTIONS  15 yrs. ago   WISDOM TOOTH EXTRACTION      Family History  Problem Relation Age of Onset   Anxiety disorder Father    Heart disease Father    Hypertension Father    Thyroid disease Mother    Anxiety disorder Sister    Hyperthyroidism Sister    Colon cancer Neg Hx    Esophageal cancer Neg Hx    Pancreatic cancer Neg Hx     Allergies  Allergen Reactions   Chantix [Varenicline] Other (See Comments)    Agitation and sleep walking     Current Outpatient Medications on File Prior to Visit  Medication Sig Dispense Refill   albuterol (VENTOLIN HFA) 108 (90 Base) MCG/ACT inhaler INHALE 2 PUFFS BY MOUTH EVERY 6 HOURS AS NEEDED. **DUE FOR YEARLY PHYSICAL** 8.5 g 0   ALPRAZolam (XANAX) 0.5 MG tablet Take 1 tablet (0.5 mg total) by mouth 2 (two) times  daily. 60 tablet 2   atorvastatin (LIPITOR) 40 MG tablet Take 1 tablet (40 mg total) by mouth at bedtime. 90 tablet 3   busPIRone (BUSPAR) 5 MG tablet Take 1 tablet (5 mg total) by mouth 2 (two) times daily. 180 tablet 0   cephALEXin (KEFLEX) 500 MG capsule Take 1 capsule (500 mg total) by mouth 4 (four) times daily. 20 capsule 0   Colchicine 0.6 MG CAPS Take 1 tablet by mouth 2 (two) times daily as needed. 60 capsule 3   fenofibrate (TRICOR) 145 MG tablet Take 1 tablet (145 mg total) by mouth daily. 90 tablet 0   HYDROcodone-acetaminophen (NORCO) 5-325 MG tablet Take 1-2 tablets by mouth every 6 (six) hours as needed. 12 tablet 0   levothyroxine (SYNTHROID) 175 MCG tablet Take  1 tablet (175 mcg total) by mouth daily before breakfast. 90 tablet 2   lisinopril (ZESTRIL) 40 MG tablet Take 1 tablet (40 mg total) by mouth daily. 90 tablet 3   nabumetone (RELAFEN) 750 MG tablet Take 1 tablet (750 mg total) by mouth 2 (two) times daily as needed. 60 tablet 6   nitroGLYCERIN (NITRO-DUR) 0.1 mg/hr patch Apply 1/4 patch to affected area 12 hours daily 30 patch 3   nystatin-triamcinolone ointment (MYCOLOG) Apply 1 Application topically 2 (two) times daily. 60 g 2   pantoprazole (PROTONIX) 40 MG tablet Take 1 tablet (40 mg total) by mouth daily. 90 tablet 1   pregabalin (LYRICA) 225 MG capsule Take 1 capsule (225 mg total) by mouth 2 (two) times daily. 60 capsule 2   sertraline (ZOLOFT) 100 MG tablet Take 2 tablets (200 mg total) by mouth daily. 180 tablet 1   sildenafil (REVATIO) 20 MG tablet Take 1-5 tablets (20-100 mg total) by mouth as needed 90 tablet 0   testosterone cypionate (DEPOTESTOSTERONE CYPIONATE) 200 MG/ML injection 0.5 ml IM Q1WK 1 mL 1   testosterone cypionate (DEPOTESTOTERONE CYPIONATE) 100 MG/ML injection Inject 1 mL (100 mg total) into the muscle once a week. 5 mL 1   tiZANidine (ZANAFLEX) 2 MG tablet Take 1-2 tablets (2-4 mg total) by mouth at bedtime. 60 tablet 2   [DISCONTINUED]  omeprazole (PRILOSEC) 20 MG capsule Take 1 capsule (20 mg total) by mouth daily. 30 capsule 3   No current facility-administered medications on file prior to visit.    There were no vitals taken for this visit.      Objective:   Physical Exam Vitals and nursing note reviewed.  Constitutional:      General: He is not in acute distress.    Appearance: Normal appearance. He is well-developed. He is obese.  HENT:     Head: Normocephalic and atraumatic.     Right Ear: Tympanic membrane, ear canal and external ear normal. There is no impacted cerumen.     Left Ear: Tympanic membrane, ear canal and external ear normal. There is no impacted cerumen.     Nose: Nose normal. No congestion or rhinorrhea.     Mouth/Throat:     Mouth: Mucous membranes are moist.     Dentition: Has dentures.     Pharynx: Oropharynx is clear. No oropharyngeal exudate or posterior oropharyngeal erythema.  Eyes:     General:        Right eye: No discharge.        Left eye: No discharge.     Extraocular Movements: Extraocular movements intact.     Conjunctiva/sclera: Conjunctivae normal.     Pupils: Pupils are equal, round, and reactive to light.  Neck:     Vascular: No carotid bruit.     Trachea: No tracheal deviation.  Cardiovascular:     Rate and Rhythm: Normal rate and regular rhythm.     Pulses: Normal pulses.     Heart sounds: Normal heart sounds. No murmur heard.    No friction rub. No gallop.  Pulmonary:     Effort: Pulmonary effort is normal. No respiratory distress.     Breath sounds: Normal breath sounds. No stridor. No wheezing, rhonchi or rales.  Chest:     Chest wall: No tenderness.  Abdominal:     General: Bowel sounds are normal. There is no distension.     Palpations: Abdomen is soft. There is no mass.     Tenderness: There  is no abdominal tenderness. There is no right CVA tenderness, left CVA tenderness, guarding or rebound.     Hernia: No hernia is present.  Musculoskeletal:         General: No swelling, tenderness, deformity or signs of injury. Normal range of motion.     Right lower leg: No edema.     Left lower leg: No edema.  Lymphadenopathy:     Cervical: No cervical adenopathy.  Skin:    General: Skin is warm and dry.     Capillary Refill: Capillary refill takes less than 2 seconds.     Coloration: Skin is not jaundiced or pale.     Findings: No bruising, erythema, lesion or rash.  Neurological:     General: No focal deficit present.     Mental Status: He is alert and oriented to person, place, and time.     Cranial Nerves: No cranial nerve deficit.     Sensory: No sensory deficit.     Motor: No weakness.     Coordination: Coordination normal.     Gait: Gait normal.     Deep Tendon Reflexes: Reflexes normal.  Psychiatric:        Mood and Affect: Mood normal.        Behavior: Behavior normal.        Thought Content: Thought content normal.        Judgment: Judgment normal.       Assessment & Plan:  1. Routine general medical examination at a health care facility - Needs to quit smoking  - Work on weight loss through diet and exercise - Follow up in one year or sooner if needed - CBC with Differential/Platelet; Future - Comprehensive metabolic panel; Future - Hemoglobin A1c; Future - Lipid panel; Future - TSH; Future  2. Anxiety and depression - Well controlled.  - No change in medication..  - CBC with Differential/Platelet; Future - Comprehensive metabolic panel; Future - Hemoglobin A1c; Future - Lipid panel; Future - TSH; Future - busPIRone (BUSPAR) 5 MG tablet; Take 1 tablet (5 mg total) by mouth 2 (two) times daily.  Dispense: 180 tablet; Refill: 1 - ALPRAZolam (XANAX) 0.5 MG tablet; Take 1 tablet (0.5 mg total) by mouth 2 (two) times daily.  Dispense: 60 tablet; Refill: 2  3. Low testosterone - Pr urology   4. Prostate cancer screening  - PSA; Future  5. Tobacco use - Encouraged to quit smoking   6. Chronic musculoskeletal  pain  - pregabalin (LYRICA) 225 MG capsule; Take 1 capsule (225 mg total) by mouth 2 (two) times daily.  Dispense: 60 capsule; Refill: 2  7. Chronic use of opiate for therapeutic purpose - Follow up with pain management as directed  8. HYPERTENSION, BENIGN ESSENTIAL - Well controlled. No change in medications  - CBC with Differential/Platelet; Future - Comprehensive metabolic panel; Future - Hemoglobin A1c; Future - Lipid panel; Future - TSH; Future  9. Postablative hypothyroidism - Follow up with endocrinology as directed - CBC with Differential/Platelet; Future - Comprehensive metabolic panel; Future - Hemoglobin A1c; Future - Lipid panel; Future - TSH; Future  10. Mixed hyperlipidemia - Consider increase in statin  - CBC with Differential/Platelet; Future - Comprehensive metabolic panel; Future - Hemoglobin A1c; Future - Lipid panel; Future - TSH; Future  11. Encounter for screening for HIV  - HIV Antibody (routine testing w rflx); Future  Shirline Frees, NP

## 2021-10-17 ENCOUNTER — Other Ambulatory Visit (INDEPENDENT_AMBULATORY_CARE_PROVIDER_SITE_OTHER): Payer: 59

## 2021-10-17 ENCOUNTER — Other Ambulatory Visit (HOSPITAL_BASED_OUTPATIENT_CLINIC_OR_DEPARTMENT_OTHER): Payer: Self-pay

## 2021-10-17 DIAGNOSIS — Z114 Encounter for screening for human immunodeficiency virus [HIV]: Secondary | ICD-10-CM

## 2021-10-17 DIAGNOSIS — E782 Mixed hyperlipidemia: Secondary | ICD-10-CM | POA: Diagnosis not present

## 2021-10-17 DIAGNOSIS — F419 Anxiety disorder, unspecified: Secondary | ICD-10-CM

## 2021-10-17 DIAGNOSIS — I10 Essential (primary) hypertension: Secondary | ICD-10-CM

## 2021-10-17 DIAGNOSIS — Z125 Encounter for screening for malignant neoplasm of prostate: Secondary | ICD-10-CM

## 2021-10-17 DIAGNOSIS — F32A Depression, unspecified: Secondary | ICD-10-CM

## 2021-10-17 DIAGNOSIS — Z0000 Encounter for general adult medical examination without abnormal findings: Secondary | ICD-10-CM

## 2021-10-17 DIAGNOSIS — E890 Postprocedural hypothyroidism: Secondary | ICD-10-CM

## 2021-10-17 LAB — COMPREHENSIVE METABOLIC PANEL
ALT: 30 U/L (ref 0–53)
AST: 25 U/L (ref 0–37)
Albumin: 4.4 g/dL (ref 3.5–5.2)
Alkaline Phosphatase: 47 U/L (ref 39–117)
BUN: 13 mg/dL (ref 6–23)
CO2: 28 mEq/L (ref 19–32)
Calcium: 9.3 mg/dL (ref 8.4–10.5)
Chloride: 102 mEq/L (ref 96–112)
Creatinine, Ser: 1.07 mg/dL (ref 0.40–1.50)
GFR: 82.61 mL/min (ref >60.00)
Glucose, Bld: 109 mg/dL — ABNORMAL HIGH (ref 70–99)
Potassium: 4.1 mEq/L (ref 3.5–5.1)
Sodium: 139 mEq/L (ref 135–145)
Total Bilirubin: 0.5 mg/dL (ref 0.2–1.2)
Total Protein: 6.9 g/dL (ref 6.0–8.3)

## 2021-10-17 LAB — LDL CHOLESTEROL, DIRECT: Direct LDL: 109 mg/dL

## 2021-10-17 LAB — CBC WITH DIFFERENTIAL/PLATELET
Basophils Absolute: 0.1 10*3/uL (ref 0.0–0.1)
Basophils Relative: 0.7 % (ref 0.0–3.0)
Eosinophils Absolute: 0.2 10*3/uL (ref 0.0–0.7)
Eosinophils Relative: 2.1 % (ref 0.0–5.0)
HCT: 39.3 % (ref 39.0–52.0)
Hemoglobin: 13.5 g/dL (ref 13.0–17.0)
Lymphocytes Relative: 24 % (ref 12.0–46.0)
Lymphs Abs: 1.9 10*3/uL (ref 0.7–4.0)
MCHC: 34.3 g/dL (ref 30.0–36.0)
MCV: 94.5 fl (ref 78.0–100.0)
Monocytes Absolute: 1.1 10*3/uL — ABNORMAL HIGH (ref 0.1–1.0)
Monocytes Relative: 13.4 % — ABNORMAL HIGH (ref 3.0–12.0)
Neutro Abs: 4.7 10*3/uL (ref 1.4–7.7)
Neutrophils Relative %: 59.8 % (ref 43.0–77.0)
Platelets: 204 10*3/uL (ref 150.0–400.0)
RBC: 4.16 Mil/uL — ABNORMAL LOW (ref 4.22–5.81)
RDW: 13.5 % (ref 11.5–15.5)
WBC: 7.9 10*3/uL (ref 4.0–10.5)

## 2021-10-17 LAB — TSH: TSH: 1.44 u[IU]/mL (ref 0.35–5.50)

## 2021-10-17 LAB — PSA: PSA: 1.24 ng/mL (ref 0.10–4.00)

## 2021-10-17 LAB — LIPID PANEL
Cholesterol: 170 mg/dL (ref 0–200)
HDL: 34.9 mg/dL — ABNORMAL LOW (ref >39.00)
NonHDL: 134.94
Total CHOL/HDL Ratio: 5
Triglycerides: 223 mg/dL — ABNORMAL HIGH (ref 0.0–149.0)
VLDL: 44.6 mg/dL — ABNORMAL HIGH (ref 0.0–40.0)

## 2021-10-17 LAB — HEMOGLOBIN A1C: Hgb A1c MFr Bld: 6.2 % (ref 4.6–6.5)

## 2021-10-18 ENCOUNTER — Other Ambulatory Visit (HOSPITAL_BASED_OUTPATIENT_CLINIC_OR_DEPARTMENT_OTHER): Payer: Self-pay

## 2021-10-18 LAB — HIV ANTIBODY (ROUTINE TESTING W REFLEX): HIV 1&2 Ab, 4th Generation: NONREACTIVE

## 2021-10-18 MED ORDER — METFORMIN HCL 500 MG PO TABS
500.0000 mg | ORAL_TABLET | Freq: Every day | ORAL | 3 refills | Status: DC
  Filled 2021-10-18: qty 90, 90d supply, fill #0
  Filled 2021-12-30: qty 90, 90d supply, fill #1
  Filled 2022-05-07: qty 90, 90d supply, fill #2
  Filled 2022-05-14: qty 30, 30d supply, fill #2
  Filled 2022-06-13: qty 30, 30d supply, fill #3
  Filled 2022-09-09: qty 30, 30d supply, fill #4

## 2021-10-22 ENCOUNTER — Other Ambulatory Visit (HOSPITAL_BASED_OUTPATIENT_CLINIC_OR_DEPARTMENT_OTHER): Payer: Self-pay

## 2021-10-25 ENCOUNTER — Other Ambulatory Visit (HOSPITAL_BASED_OUTPATIENT_CLINIC_OR_DEPARTMENT_OTHER): Payer: Self-pay

## 2021-11-07 ENCOUNTER — Other Ambulatory Visit (HOSPITAL_BASED_OUTPATIENT_CLINIC_OR_DEPARTMENT_OTHER): Payer: Self-pay

## 2021-11-20 ENCOUNTER — Telehealth: Payer: Self-pay | Admitting: Pain Medicine

## 2021-11-20 NOTE — Telephone Encounter (Signed)
Patients wife called asking if Dr Dossie Arbour will give patient Chad Wilson a second chance of being a patient with him. She states, she was in school and working full time and wasn't able to bring him for appts. Please check with Dr. Dossie Arbour and ask if he will consider seeing this patient for procedures again.

## 2021-11-20 NOTE — Telephone Encounter (Signed)
Are you will to take this patient on again? If so, please send to the front desk so they can make him an appointment.

## 2021-11-21 DIAGNOSIS — M546 Pain in thoracic spine: Secondary | ICD-10-CM | POA: Diagnosis not present

## 2021-11-21 DIAGNOSIS — M542 Cervicalgia: Secondary | ICD-10-CM | POA: Diagnosis not present

## 2021-11-21 DIAGNOSIS — M545 Low back pain, unspecified: Secondary | ICD-10-CM | POA: Diagnosis not present

## 2021-11-22 ENCOUNTER — Other Ambulatory Visit (HOSPITAL_COMMUNITY): Payer: Self-pay | Admitting: Orthopedic Surgery

## 2021-11-22 ENCOUNTER — Other Ambulatory Visit: Payer: Self-pay | Admitting: Orthopedic Surgery

## 2021-11-22 ENCOUNTER — Other Ambulatory Visit (HOSPITAL_BASED_OUTPATIENT_CLINIC_OR_DEPARTMENT_OTHER): Payer: Self-pay

## 2021-11-22 ENCOUNTER — Other Ambulatory Visit: Payer: Self-pay | Admitting: Adult Health

## 2021-11-22 ENCOUNTER — Encounter: Payer: Self-pay | Admitting: Adult Health

## 2021-11-22 DIAGNOSIS — M542 Cervicalgia: Secondary | ICD-10-CM

## 2021-11-22 DIAGNOSIS — M546 Pain in thoracic spine: Secondary | ICD-10-CM

## 2021-11-22 DIAGNOSIS — M545 Low back pain, unspecified: Secondary | ICD-10-CM

## 2021-11-22 MED ORDER — HYDROCODONE-ACETAMINOPHEN 5-325 MG PO TABS
1.0000 | ORAL_TABLET | Freq: Four times a day (QID) | ORAL | 0 refills | Status: DC | PRN
  Filled 2021-11-22: qty 12, 2d supply, fill #0

## 2021-11-22 NOTE — Telephone Encounter (Signed)
Please advise 

## 2021-11-23 ENCOUNTER — Ambulatory Visit (HOSPITAL_BASED_OUTPATIENT_CLINIC_OR_DEPARTMENT_OTHER)
Admission: RE | Admit: 2021-11-23 | Discharge: 2021-11-23 | Disposition: A | Discharge status: Home / Self Care | Payer: 59 | Source: Ambulatory Visit | Attending: Orthopedic Surgery | Admitting: Orthopedic Surgery

## 2021-11-23 DIAGNOSIS — M546 Pain in thoracic spine: Secondary | ICD-10-CM | POA: Diagnosis not present

## 2021-11-23 DIAGNOSIS — G9589 Other specified diseases of spinal cord: Secondary | ICD-10-CM | POA: Diagnosis not present

## 2021-11-23 DIAGNOSIS — M4802 Spinal stenosis, cervical region: Secondary | ICD-10-CM | POA: Insufficient documentation

## 2021-11-23 DIAGNOSIS — M48061 Spinal stenosis, lumbar region without neurogenic claudication: Secondary | ICD-10-CM | POA: Insufficient documentation

## 2021-11-23 DIAGNOSIS — G8929 Other chronic pain: Secondary | ICD-10-CM | POA: Diagnosis not present

## 2021-11-23 DIAGNOSIS — M545 Low back pain, unspecified: Secondary | ICD-10-CM

## 2021-11-23 DIAGNOSIS — M47812 Spondylosis without myelopathy or radiculopathy, cervical region: Secondary | ICD-10-CM | POA: Insufficient documentation

## 2021-11-23 DIAGNOSIS — M542 Cervicalgia: Secondary | ICD-10-CM | POA: Diagnosis not present

## 2021-11-23 DIAGNOSIS — M549 Dorsalgia, unspecified: Secondary | ICD-10-CM | POA: Diagnosis not present

## 2021-11-23 MED ORDER — GADOBUTROL 1 MMOL/ML IV SOLN
9.0000 mL | Freq: Once | INTRAVENOUS | Status: AC | PRN
Start: 2021-11-23 — End: 2021-11-23
  Administered 2021-11-23: 9 mL via INTRAVENOUS
  Filled 2021-11-23: qty 10

## 2021-11-26 NOTE — Telephone Encounter (Signed)
Please check with Dr. Dossie Arbour to see if he will see this patient for interventional pain mgmt.

## 2021-11-27 NOTE — Telephone Encounter (Signed)
Note placed on MD desk.

## 2021-12-04 ENCOUNTER — Other Ambulatory Visit: Payer: Self-pay | Admitting: Adult Health

## 2021-12-04 ENCOUNTER — Other Ambulatory Visit (HOSPITAL_BASED_OUTPATIENT_CLINIC_OR_DEPARTMENT_OTHER): Payer: Self-pay

## 2021-12-04 DIAGNOSIS — I10 Essential (primary) hypertension: Secondary | ICD-10-CM

## 2021-12-04 DIAGNOSIS — G894 Chronic pain syndrome: Secondary | ICD-10-CM

## 2021-12-05 ENCOUNTER — Other Ambulatory Visit (HOSPITAL_BASED_OUTPATIENT_CLINIC_OR_DEPARTMENT_OTHER): Payer: Self-pay

## 2021-12-05 MED ORDER — TESTOSTERONE CYPIONATE 200 MG/ML IM SOLN
100.0000 mg | INTRAMUSCULAR | 1 refills | Status: DC
  Filled 2021-12-05: qty 1, 7d supply, fill #0
  Filled 2021-12-30: qty 1, 7d supply, fill #1

## 2021-12-05 MED ORDER — ATORVASTATIN CALCIUM 40 MG PO TABS
40.0000 mg | ORAL_TABLET | Freq: Every day | ORAL | 3 refills | Status: DC
  Filled 2021-12-05: qty 90, 90d supply, fill #0
  Filled 2021-12-30 – 2022-03-03 (×2): qty 90, 90d supply, fill #1
  Filled 2022-03-20 – 2022-05-14 (×3): qty 30, 30d supply, fill #1
  Filled 2022-06-13: qty 30, 30d supply, fill #2
  Filled 2022-09-09: qty 30, 30d supply, fill #3

## 2021-12-05 MED ORDER — LISINOPRIL 40 MG PO TABS
40.0000 mg | ORAL_TABLET | Freq: Every day | ORAL | 3 refills | Status: DC
  Filled 2021-12-05: qty 90, 90d supply, fill #0
  Filled 2021-12-30 – 2022-05-07 (×4): qty 90, 90d supply, fill #1
  Filled 2022-05-14: qty 30, 30d supply, fill #1
  Filled 2022-06-13: qty 30, 30d supply, fill #2
  Filled 2022-09-09: qty 30, 30d supply, fill #3

## 2021-12-30 ENCOUNTER — Other Ambulatory Visit (HOSPITAL_BASED_OUTPATIENT_CLINIC_OR_DEPARTMENT_OTHER): Payer: Self-pay

## 2021-12-30 ENCOUNTER — Other Ambulatory Visit: Payer: Self-pay | Admitting: Adult Health

## 2021-12-30 ENCOUNTER — Other Ambulatory Visit (HOSPITAL_BASED_OUTPATIENT_CLINIC_OR_DEPARTMENT_OTHER): Payer: Self-pay | Admitting: Nurse Practitioner

## 2021-12-30 DIAGNOSIS — G894 Chronic pain syndrome: Secondary | ICD-10-CM

## 2021-12-30 DIAGNOSIS — G8929 Other chronic pain: Secondary | ICD-10-CM

## 2021-12-31 ENCOUNTER — Other Ambulatory Visit (HOSPITAL_BASED_OUTPATIENT_CLINIC_OR_DEPARTMENT_OTHER): Payer: Self-pay

## 2021-12-31 ENCOUNTER — Encounter (HOSPITAL_BASED_OUTPATIENT_CLINIC_OR_DEPARTMENT_OTHER): Payer: Self-pay | Admitting: Pharmacist

## 2021-12-31 MED ORDER — SILDENAFIL CITRATE 20 MG PO TABS
20.0000 mg | ORAL_TABLET | ORAL | 0 refills | Status: DC
  Filled 2021-12-31: qty 90, 90d supply, fill #0

## 2022-01-01 ENCOUNTER — Other Ambulatory Visit (HOSPITAL_BASED_OUTPATIENT_CLINIC_OR_DEPARTMENT_OTHER): Payer: Self-pay

## 2022-01-01 MED ORDER — FENOFIBRATE 145 MG PO TABS
145.0000 mg | ORAL_TABLET | Freq: Every day | ORAL | 0 refills | Status: DC
  Filled 2022-01-01: qty 90, 90d supply, fill #0

## 2022-01-02 ENCOUNTER — Other Ambulatory Visit (HOSPITAL_BASED_OUTPATIENT_CLINIC_OR_DEPARTMENT_OTHER): Payer: Self-pay

## 2022-01-03 ENCOUNTER — Other Ambulatory Visit (HOSPITAL_BASED_OUTPATIENT_CLINIC_OR_DEPARTMENT_OTHER): Payer: Self-pay

## 2022-01-03 NOTE — Telephone Encounter (Signed)
Dr Valentina Gu will not be seeing this patinet. See notes

## 2022-01-14 ENCOUNTER — Ambulatory Visit (INDEPENDENT_AMBULATORY_CARE_PROVIDER_SITE_OTHER): Payer: 59 | Admitting: Family Medicine

## 2022-01-14 VITALS — BP 132/84 | HR 77 | Ht 67.5 in | Wt 207.0 lb

## 2022-01-14 DIAGNOSIS — G8929 Other chronic pain: Secondary | ICD-10-CM

## 2022-01-14 DIAGNOSIS — M47816 Spondylosis without myelopathy or radiculopathy, lumbar region: Secondary | ICD-10-CM | POA: Diagnosis not present

## 2022-01-14 DIAGNOSIS — M5441 Lumbago with sciatica, right side: Secondary | ICD-10-CM

## 2022-01-14 NOTE — Patient Instructions (Signed)
Thank you for coming in today.   Call or go to the ER if you develop a large red swollen joint with extreme pain or oozing puss.    I've referred you to Physical Therapy.  Let us know if you don't hear from them in one week.   Recheck in 6 weeks.   Let me know if you have a problem.

## 2022-01-14 NOTE — Progress Notes (Signed)
I, Chad Wilson, LAT, ATC acting as a scribe for Chad Leader, MD.  Subjective:    CC: Back pain  HPI: Pt is a 46 y/o male c/o back pain that's chronic in nature and worsening over the last few months. 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 Pt had a medial branch block and ablation on 03/29/21. Pt locates pain mostly the R-side of his low back w/ radiating pain along the lateral aspect of the R thigh. Pt notes he is "dragging" his R great toe.   He has had several different attempts at back injections.  He has had facet medial branch block and ablations in the past which she did not think helped all that much.  He notes aggravating bothersome right low back pain with pain radiating down his right leg.    He works Training and development officer. He cannot recall having physical therapy for his chronic back pain prior.   Radiating pain: yes LE numbness/tingling: no LE weakness: yes Aggravates: everything Treatments tried: sitting on a hard surface, prior ESI, Tylenol  Dx imaging: 11/23/21 T-spine, C-spine, & L-spine MRI  09/27/20 L-spine MRI  07/22/20 R hip MRI  Pertinent review of Systems: No fevers or chills  Relevant historical information: Chronic back pain.   Objective:    Vitals:   01/14/22 0748  BP: 132/84  Pulse: 77  SpO2: 94%   General: Well Developed, well nourished, and in no acute distress.   MSK: L-spine: Normal. Nontender to palpation spinal midline.  Tender palpation right lumbar paraspinal musculature. Decreased lumbar motion. Lower extremity strength intact with exception of right foot dorsiflexion which is diminished 4/5. Reflexes are diminished. Mild antalgic gait.  Lab and Radiology Results  CLINICAL DATA:  Tech note: Chronic back pain x6 yrs radiates down right leg with leg becoming weak and hard to lift, pt does flooring and is constantly bent over, NKI, no hx CA, has had surgery on his cervical.   EXAM: MRI  CERVICAL, THORACIC AND LUMBAR SPINE WITHOUT AND WITH CONTRAST   TECHNIQUE: Multiplanar and multiecho pulse sequences of the cervical spine, to include the craniocervical junction and cervicothoracic junction, and thoracic and lumbar spine, were obtained without and with intravenous contrast.   CONTRAST:  13m GADAVIST GADOBUTROL 1 MMOL/ML IV SOLN   COMPARISON:  None Available.   FINDINGS: MRI CERVICAL SPINE FINDINGS   Alignment: Mild reversal of the normal cervical lordosis. No substantial sagittal subluxation.   Vertebrae: C5-C7 ACDF. No visible marrow edema to suggest acute fracture discitis/osteomyelitis. No suspicious bone lesions.   Cord: Small focus of nonenhancing T2 hyperintensity within the right eccentric cord at C6-C7. Suggestion of mild cord atrophy at this level. Findings are compatible with myelomalacia when correlating with prior MRI given previous stenosis at this level. No abnormal cord enhancement.   Posterior Fossa, vertebral arteries, paraspinal tissues: Vertebral artery flow voids are maintained.   Disc levels:   C2-C3: Facet and uncovertebral hypertrophy without significant canal or foraminal stenosis.   C3-C4: Posterior disc osteophyte complex with bilateral facet and uncovertebral hypertrophy. Resulting mild to moderate bilateral foraminal stenosis and mild canal stenosis. Findings are mildly progressed.   C4-C5: Posterior disc osteophyte complex with bilateral facet and uncovertebral hypertrophy. Resulting moderate left and mild right foraminal stenosis with mild to moderate canal stenosis. Findings are mildly progressed.   C5-C6: ACDF. Canal is patent. Left greater than right facet and uncovertebral hypertrophy. Resulting moderate left and mild right foraminal stenosis, mildly  progressed.   C6-C7: ACDF. Patent canal. Right greater than left facet and uncovertebral hypertrophy. Severe right and moderate left foraminal stenosis, probably  similar.   C7-T1: Facet arthropathy.  No significant stenosis.   MRI THORACIC SPINE FINDINGS   Alignment:  Normal.   Vertebrae: Vertebral bodies are maintained. No focal marrow edema to suggest acute fracture discitis/osteomyelitis.   Cord:  Normal cord signal.  Normal cord morphology.   Paraspinal and other soft tissues: Unremarkable.   Disc levels:   Small disc protrusions at multiple levels without significant stenosis. Mild multilevel facet arthropathy without significant foraminal stenosis. Mildly prominent dorsal epidural fat.   MRI LUMBAR SPINE FINDINGS   Segmentation: When counting from the top, transitional lumbosacral anatomy with lumbarized S1.   Alignment:  Trace retrolisthesis of L4 on L5.   Vertebrae: No evidence of acute fracture or discitis/osteomyelitis. No suspicious bone lesions.   Conus medullaris and cauda equina: Conus extends to the L2 level. Conus appears normal. No abnormal enhancement of the conus or cauda equina.   Paraspinal and other soft tissues: Unremarkable.   Disc levels:   L1-L2: No significant stenosis.   L2-L3: Disc desiccation height loss. Mild disc bulging and mild bilateral facet arthropathy. Mild canal stenosis. Patent foramina.   L3-L4: disc desiccation height loss. Broad disc bulging with mild bilateral facet arthropathy. Mild subarticular recess stenosis. Patent central canal. Mild bilateral foraminal stenosis.   L4-L5: Slight disc bulging and mild facet arthropathy with mild bilateral foraminal stenosis. Patent canal.   L5-S1: Small central disc protrusion and mild disc bulging with mild facet arthropathy. Mild bilateral foraminal stenosis. Patent canal.   S1-S2: Transitional anatomy.  No significant stenosis.   IMPRESSION: MRI cervical spine:   1. Interval ACDF at C5-C7 with improved (now patent) canal. 2. Mild progression of multilevel foraminal stenosis at a few, detailed above and severe on the right at C6-C7  and moderate on the left at C5-C6 and C6-C7. 3. Small focus of myelomalacia in the right eccentric cord at C6-C7 at the site of prior stenosis.   MRI thoracic spine:   Mild multilevel degenerative change without significant stenosis.   MRI lumbar spine:   1. When counting from the top, transitional lumbosacral anatomy with lumbarized S1. Correlation with radiographs is recommended prior to any operative intervention. 2. Mild canal stenosis at L3-L4 and multilevel mild foraminal stenosis, as detailed above. No evidence of impingement.     Electronically Signed   By: Margaretha Sheffield M.D.   On: 11/23/2021 09:33   I, Chad Wilson, personally (independently) visualized and performed the interpretation of the Lumbar spine images attached in this note.    Impression and Recommendations:    Assessment and Plan: 46 y.o. male with  Chronic right low back pain with radicular symptoms in an L5 dermatomal pattern associated with weakness.  Plan for physical therapy.  He has had multiple different interventional trials in his back but I do not think he is ever really had a good trial of physical therapy to work on core strength.  With his physically demanding job core strength I think will be very important.  Plan to refer to PT.  If not better we could retry some of those facet injections or medial branch block and ablations but I am less optimistic about them as 30s had some trial of them in the past most recently on his left side.  He does have right radicular pain and weakness in an L5 dermatomal pattern.  Plan  for an epidural steroid injection.  Spent quite a bit of time talking about the goals of this injection.  The goal is not to help his back pain but to help his leg pain.  He already has adequate medication management for his current symptoms with Lyrica, tizanidine, and oral NSAIDs.  If opiates are needed to control his pain he may need a pain management clinic for medication  management.  Mali understands such in office will be challenging and he would like to avoid that for now as well.  I think that is a great idea.  Marland Kitchen  PDMP reviewed during this encounter. Orders Placed This Encounter  Procedures   DG INJECT DIAG/THERA/INC NEEDLE/CATH/PLC EPI/LUMB/SAC W/IMG    Standing Status:   Future    Standing Expiration Date:   01/15/2023    Order Specific Question:   Reason for Exam (SYMPTOM  OR DIAGNOSIS REQUIRED)    Answer:   Suspect Rt L5. Level and techniq per radiology    Order Specific Question:   Preferred Imaging Location?    Answer:   [id number]    Order Specific Question:   Radiology Contrast Protocol - do NOT remove file path    Answer:   [email or web address]   Ambulatory referral to Physical Therapy    Referral Priority:   Routine    Referral Type:   Physical Medicine    Referral Reason:   Specialty Services Required    Requested Specialty:   Physical Therapy    Number of Visits Requested:   1   No orders of the defined types were placed in this encounter.   Discussed warning signs or symptoms. Please see discharge instructions. Patient expresses understanding.   The above documentation has been reviewed and is accurate and complete Chad Wilson, M.D.

## 2022-01-21 NOTE — Therapy (Addendum)
PHYSICAL THERAPY DISCHARGE SUMMARY  Visits from Start of Care: 1  Current functional level related to goals / functional outcomes: None met at this time   Remaining deficits: Cannot assess secondary to not returning since evaluation   Education / Equipment: None at this time   Patient agrees to discharge. Patient goals were not met. Patient is being discharged due to not returning since the last visit.  9:47 AM, 02/14/22 Jerene Pitch, DPT Physical Therapy with [facility]     OUTPATIENT PHYSICAL THERAPY THORACOLUMBAR EVALUATION   Patient Name: Chad Wilson MRN: [id number] DOB:[birth year], 46 y.o., male Today's Date: 01/22/2022   PT End of Session - 01/22/22 0851     Visit Number 1    Number of Visits 12    Date for PT Re-Evaluation 03/19/22    Authorization Type [place]    PT Start Time 0851    PT Stop Time 0928    PT Time Calculation (min) 37 min             Past Medical History:  Diagnosis Date   Acute hepatitis C without mention of hepatic coma(070.51)    has been cleared    Acute postoperative pain 12/10/2018   Alcohol abuse, unspecified    Anxiety and depression    Anxiety states    Essential hypertension, benign    GERD (gastroesophageal reflux disease)    Graves disease    treated by Dr Loanne Drilling   Panic disorder without agoraphobia    Thyroid disease    Graves Disease   Tobacco use disorder    Past Surgical History:  Procedure Laterality Date   ANTERIOR CERVICAL DECOMP/DISCECTOMY FUSION N/A 05/21/2017   Procedure: Anterior Cervical Decompression Fusion Cervical five-six, Cervical six-seven;  Surgeon: Eustace Moore, MD;  Location: [facility];  Service: Neurosurgery;  Laterality: N/A;   MULTIPLE TOOTH EXTRACTIONS  15 yrs. 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   Subacute right lumbar radiculopathy 11/15/2020   Herniated nucleus pulposus, L3-4 right 11/15/2020    Abnormal MRI, hip (Right) (07/22/2020) 08/21/2020   Degenerative tear of acetabular labrum of hip (Right) 08/21/2020   Chronic use of opiate for therapeutic purpose 07/24/2020   Chronic musculoskeletal pain 07/13/2020   Greater trochanteric bursitis (Right) 06/22/2020   Enthesopathy of hip region (Right) 06/07/2020   Other bursitis of hip (Right) 06/07/2020   Chronic hip pain (Right) 62/83/1517   Uncomplicated opioid dependence ([place]) 02/24/2020   Mixed hyperlipidemia 09/02/2019   Neurogenic pain 10/21/2018   Preoperative testing 08/17/2018   Grade 1 Lumbosacral Retrolisthesis of L5/S1 06/29/2018   Lumbar facet hypertrophy (Bilateral) 06/29/2018   Osteoarthritis of facet joint of lumbar spine 06/29/2018   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   Lumbar spondylosis 06/29/2018   DDD (degenerative disc disease), cervical 06/29/2018   Cervicalgia 06/29/2018   Abnormal MRI, lumbar spine 06/29/2018   Long term prescription benzodiazepine use 06/29/2018   Vitamin B12 deficiency 06/28/2018   Vitamin D insufficiency 06/28/2018   Spondylosis without myelopathy or radiculopathy, lumbosacral region 06/28/2018   Chronic pain syndrome 06/03/2018   Pharmacologic therapy 06/03/2018   Long term current use of opiate analgesic 06/03/2018   Disorder of skeletal system 06/03/2018   Problems influencing health status 06/03/2018   Chronic low back pain (1ry area of Pain) (Bilateral) (R=L) w/o sciatica 06/03/2018   DDD (degenerative disc disease), lumbosacral 04/06/2018   Lumbar  facet syndrome (Bilateral) 04/06/2018   Status post lumbar spine surgery for decompression of spinal cord 05/21/2017   History of cervical spinal surgery 05/21/2017   Hypothyroidism 04/14/2017   Chronic neck pain with history of cervical spinal surgery 09/06/2011   Cervical radiculopathy at C6 09/06/2011   JAUNDICE 05/21/2010   HEPATITIS C 11/21/2009   Anxiety and depression 11/21/2009   PANIC ATTACK 11/21/2009   Alcohol  abuse 11/21/2009   Tobacco abuse 11/21/2009   HYPERTENSION, BENIGN ESSENTIAL 11/21/2009   FATIGUE 11/21/2009    PCP: Dorothyann Peng, NP  REFERRING PROVIDER: Gregor Hams, MD  REFERRING DIAG: [phone] (ICD-10-CM) - Lumbar facet joint syndrome M54.41,G89.29 (ICD-10-CM) - Chronic right-sided low back pain with right-sided sciatica  Rationale for Evaluation and Treatment: Rehabilitation  THERAPY DIAG:  DDD (degenerative disc disease), lumbosacral - Plan: PT plan of care cert/re-cert  Muscle weakness (generalized) - Plan: PT plan of care cert/re-cert  ONSET DATE: 3-4 years ago  SUBJECTIVE:                                                                                                                                                                                           SUBJECTIVE STATEMENT: States that he started tripping over his right foot about 3-4  years ago. States that he has a mild toothache right at his belt line on the right. 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 States that in the morning it is worse. States that his right leg goes numb and he has to wake up and flip over. And he sleeps on his right side. States that he has limp that has started about 8 months ago. States that he was getting injections  in his back and he would have a couple days of relief. States it is also worse at the end of day. States if he lays down for long periods of time he has a lot of pain.    PERTINENT HISTORY:  Medial branch block and ablation 03/29/21, ACDF C5-7 2019  PAIN:  Are you having pain? Yes: NPRS scale: 6/10 Pain location: right low back  Pain description: achy, tender, tooth ache Aggravating factors: AM, bending, lifting and twisting, standing still . Relieving factors: sitting  PRECAUTIONS: None  WEIGHT BEARING RESTRICTIONS: No  FALLS:  Has patient fallen in last 6 months? No   OCCUPATION: laying flooring  PLOF: Independent  PATIENT GOALS: to  have less pain  NEXT MD VISIT:   OBJECTIVE:   DIAGNOSTIC FINDINGS:  11/23/21 IMPRESSION: MRI cervical spine:   1. Interval ACDF at C5-C7 with improved (now patent) canal. 2. Mild  progression of multilevel foraminal stenosis at a few, detailed above and severe on the right at C6-C7 and moderate on the left at C5-C6 and C6-C7. 3. Small focus of myelomalacia in the right eccentric cord at C6-C7 at the site of prior stenosis.   MRI thoracic spine:   Mild multilevel degenerative change without significant stenosis.   MRI lumbar spine:   1. When counting from the top, transitional lumbosacral anatomy with lumbarized S1. Correlation with radiographs is recommended prior to any operative intervention. 2. Mild canal stenosis at L3-L4 and multilevel mild foraminal stenosis, as detailed above. No evidence of impingement.     SCREENING FOR RED FLAGS: Bowel or bladder incontinence: No Spinal tumors: No Cauda equina syndrome: No Compression fracture: No Abdominal aneurysm: No  COGNITION: Overall cognitive status: Within functional limits for tasks assessed     SENSATION: Not tested   POSTURE: increased thoracic kyphosis, posterior pelvic tilt, and flexed trunk   PALPATION: Tenderness to palpation along right glutes and lumbar paraspinals.   LUMBAR ROM:   AROM eval  Flexion 25% limited  Extension 75% limited  Right lateral flexion 50% limited  Left lateral flexion 50% limited  Right rotation   Left rotation    (Blank rows = not tested)    LE Measurements Lower Extremity Right 01/22/2022 Left 01/22/2022   A/PROM MMT A/PROM MMT  Hip Flexion  3+  4-  Hip Extension 0 2+ Lacking 5 2  Hip Abduction      Hip Adduction      Hip Internal rotation 15  15   Hip External rotation 30  30   Knee Flexion  3+  4-  Knee Extension  3+  4-  Ankle Dorsiflexion      Ankle Plantarflexion      Ankle Inversion      Ankle Eversion       (Blank rows = not tested) *  pain   LUMBAR SPECIAL TESTS:  Traction - slight relief, neg slump and SLR B, neg ely's test B,     GAIT: Distance walked: 25 ft Assistive device utilized: None Level of assistance: Modified independence Comments: limps on right, limited trunk and hip mobility noted  TODAY'S TREATMENT:                                                                                                                              DATE:   01/22/2022  Therapeutic Exercise:  Aerobic: Supine: LTR  on ball 2 minutes Prone: lying 2 minutes, on elbows 2 minutes  Seated:  Standing: Neuromuscular Re-education: Manual Therapy: Therapeutic Activity: Self Care: Trigger Point Dry Needling:  Modalities:    PATIENT EDUCATION:  Education details: on current presentation, on HEP, on clinical outcomes score and POC Person educated: Patient Education method: Explanation, Demonstration, and Handouts Education comprehension: verbalized understanding  HOME EXERCISE PROGRAM: [id number]  ASSESSMENT:  CLINICAL IMPRESSION: Patient presents with chronic low back pain that limits overall mobility  and demonstrates bilateral weakness in legs with R>L. Session focused on education and importance of mobility to combat prolonged positions during the day. Patient would greatly benefit from skilled PT to improve overall function and QOL.  OBJECTIVE IMPAIRMENTS: Abnormal gait, decreased activity tolerance, decreased endurance, decreased mobility, difficulty walking, decreased ROM, decreased strength, improper body mechanics, postural dysfunction, and pain.   ACTIVITY LIMITATIONS: carrying, lifting, bending, sitting, sleeping, bed mobility, and locomotion level  PARTICIPATION LIMITATIONS: driving, community activity, and occupation  PERSONAL FACTORS: Age and 1-2 comorbidities: chronic low back pain, ACDF  are also affecting patient's functional outcome.   REHAB POTENTIAL: Good  CLINICAL DECISION MAKING:  Stable/uncomplicated  EVALUATION COMPLEXITY: Low  GOALS: Goals reviewed with patient? yes  SHORT TERM GOALS: Target date: 02/19/2022  Patient will be independent in self management strategies to improve quality of life and functional outcomes. Baseline: New Program Goal status: INITIAL  2.  Patient will report at least 50% improvement in overall symptoms and/or function to demonstrate improved functional mobility Baseline: 0% better Goal status: INITIAL  3.  Patient will be able to demonstrate at least 50% ROM in lumbar spine in all directions to demonstrate improved lumbar mobility Baseline:  Goal status: INITIAL      LONG TERM GOALS: Target date: 03/19/2022   Patient will report at least 75% improvement in overall symptoms and/or function to demonstrate improved functional mobility Baseline: 0% better Goal status: INITIAL  2.  Patient will report being able to sleep without waking up secondly to back pain to improve sleep quality. Baseline:  Goal status: INITIAL  3.  Patient will report performing stretches throughout the day to reduce stress on lumbar spine.  Baseline:  Goal status: INITIAL   PLAN:  PT FREQUENCY: 1-2x/week  PT DURATION: 12 weeks  PLANNED INTERVENTIONS: Therapeutic exercises, Therapeutic activity, Neuromuscular re-education, Balance training, Gait training, Patient/Family education, Self Care, Joint mobilization, Joint manipulation, Vestibular training, Canalith repositioning, Orthotic/Fit training, Aquatic Therapy, Dry Needling, Electrical stimulation, Spinal manipulation, Spinal mobilization, Cryotherapy, Moist heat, Ultrasound, Ionotophoresis 56m/ml Dexamethasone, Manual therapy, and Re-evaluation.  PLAN FOR NEXT SESSION: lumbar and hip mobility, extension/rotation, traction   12:10 PM, 01/22/22 MJerene Pitch DPT [facility]

## 2022-01-22 ENCOUNTER — Encounter: Payer: Self-pay | Admitting: Physical Therapy

## 2022-01-22 ENCOUNTER — Ambulatory Visit (INDEPENDENT_AMBULATORY_CARE_PROVIDER_SITE_OTHER): Payer: 59 | Admitting: Physical Therapy

## 2022-01-22 DIAGNOSIS — M5137 Other intervertebral disc degeneration, lumbosacral region: Secondary | ICD-10-CM

## 2022-01-22 DIAGNOSIS — M6281 Muscle weakness (generalized): Secondary | ICD-10-CM

## 2022-01-25 ENCOUNTER — Inpatient Hospital Stay
Admission: RE | Admit: 2022-01-25 | Discharge: 2022-01-25 | Disposition: A | Discharge status: Home / Self Care | Payer: 59 | Source: Ambulatory Visit | Attending: Family Medicine | Admitting: Family Medicine

## 2022-01-25 NOTE — Discharge Instructions (Signed)

## 2022-01-29 ENCOUNTER — Encounter: Payer: 59 | Admitting: Physical Therapy

## 2022-01-30 ENCOUNTER — Inpatient Hospital Stay
Admission: RE | Admit: 2022-01-30 | Discharge: 2022-01-30 | Disposition: A | Discharge status: Home / Self Care | Payer: 59 | Source: Ambulatory Visit | Attending: Family Medicine | Admitting: Family Medicine

## 2022-01-30 NOTE — Discharge Instructions (Signed)

## 2022-01-31 ENCOUNTER — Encounter: Payer: 59 | Admitting: Physical Therapy

## 2022-02-05 ENCOUNTER — Encounter: Payer: 59 | Admitting: Physical Therapy

## 2022-02-11 ENCOUNTER — Other Ambulatory Visit: Payer: Self-pay | Admitting: Adult Health

## 2022-02-11 ENCOUNTER — Other Ambulatory Visit (HOSPITAL_BASED_OUTPATIENT_CLINIC_OR_DEPARTMENT_OTHER): Payer: Self-pay

## 2022-02-11 ENCOUNTER — Encounter: Payer: 59 | Admitting: Physical Therapy

## 2022-02-11 DIAGNOSIS — M7918 Myalgia, other site: Secondary | ICD-10-CM

## 2022-02-11 NOTE — Therapy (Deleted)
OUTPATIENT PHYSICAL THERAPY TREATMENT NOTE   Patient Name: Chad Wilson MRN: [id number] DOB:1975-06-03, 46 y.o., male Today's Date: 02/11/2022  PCP: *** REFERRING PROVIDER: ***  END OF SESSION:    Past Medical History:  Diagnosis Date   Acute hepatitis C without mention of hepatic coma(070.51)    has been cleared    Acute postoperative pain 12/10/2018   Alcohol abuse, unspecified    Anxiety and depression    Anxiety states    Essential hypertension, benign    GERD (gastroesophageal reflux disease)    Graves disease    treated by Dr Loanne Drilling   Panic disorder without agoraphobia    Thyroid disease    Graves Disease   Tobacco use disorder    Past Surgical History:  Procedure Laterality Date   ANTERIOR CERVICAL DECOMP/DISCECTOMY FUSION N/A 05/21/2017   Procedure: Anterior Cervical Decompression Fusion Cervical five-six, Cervical six-seven;  Surgeon: Eustace Moore, MD;  Location: [place];  Service: Neurosurgery;  Laterality: N/A;   MULTIPLE TOOTH EXTRACTIONS  15 yrs. 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   Subacute right lumbar radiculopathy 11/15/2020   Herniated nucleus pulposus, L3-4 right 11/15/2020   Abnormal MRI, hip (Right) (07/22/2020) 08/21/2020   Degenerative tear of acetabular labrum of hip (Right) 08/21/2020   Chronic use of opiate for therapeutic purpose 07/24/2020   Chronic musculoskeletal pain 07/13/2020   Greater trochanteric bursitis (Right) 06/22/2020   Enthesopathy of hip region (Right) 06/07/2020   Other bursitis of hip (Right) 06/07/2020   Chronic hip pain (Right) 54/62/7035   Uncomplicated opioid dependence ([place]) 02/24/2020   Mixed hyperlipidemia 09/02/2019   Neurogenic pain 10/21/2018   Preoperative testing 08/17/2018   Grade 1 Lumbosacral Retrolisthesis of L5/S1 06/29/2018   Lumbar facet hypertrophy (Bilateral) 06/29/2018   Osteoarthritis of facet joint of  lumbar spine 06/29/2018   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   Lumbar spondylosis 06/29/2018   DDD (degenerative disc disease), cervical 06/29/2018   Cervicalgia 06/29/2018   Abnormal MRI, lumbar spine 06/29/2018   Long term prescription benzodiazepine use 06/29/2018   Vitamin B12 deficiency 06/28/2018   Vitamin D insufficiency 06/28/2018   Spondylosis without myelopathy or radiculopathy, lumbosacral region 06/28/2018   Chronic pain syndrome 06/03/2018   Pharmacologic therapy 06/03/2018   Long term current use of opiate analgesic 06/03/2018   Disorder of skeletal system 06/03/2018   Problems influencing health status 06/03/2018   Chronic low back pain (1ry area of Pain) (Bilateral) (R=L) w/o sciatica 06/03/2018   DDD (degenerative disc disease), lumbosacral 04/06/2018   Lumbar facet syndrome (Bilateral) 04/06/2018   Status post lumbar spine surgery for decompression of spinal cord 05/21/2017   History of cervical spinal surgery 05/21/2017   Hypothyroidism 04/14/2017   Chronic neck pain with history of cervical spinal surgery 09/06/2011   Cervical radiculopathy at C6 09/06/2011   JAUNDICE 05/21/2010   HEPATITIS C 11/21/2009   Anxiety and depression 11/21/2009   PANIC ATTACK 11/21/2009   Alcohol abuse 11/21/2009   Tobacco abuse 11/21/2009   HYPERTENSION, BENIGN ESSENTIAL 11/21/2009   FATIGUE 11/21/2009    PCP: Dorothyann Peng, NP   REFERRING PROVIDER: Gregor Hams, MD   REFERRING DIAG: [phone] (ICD-10-CM) - Lumbar facet joint syndrome M54.41,G89.29 (ICD-10-CM) - Chronic right-sided low back pain with right-sided sciatica   Rationale for Evaluation and Treatment: Rehabilitation   THERAPY DIAG:  DDD (degenerative disc disease), lumbosacral - Plan:  PT plan of care cert/re-cert   Muscle weakness (generalized) - Plan: PT plan of care cert/re-cert   ONSET DATE: 3-4 years ago   SUBJECTIVE:                                                                                                                                                                                             SUBJECTIVE STATEMENT: States that he started tripping over his right foot about 3-4  years ago. States that he has a mild toothache right at his belt line on the right. 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 States that in the morning it is worse. States that his right leg goes numb and he has to wake up and flip over. And he sleeps on his right side. States that he has limp that has started about 8 months ago. States that he was getting injections  in his back and he would have a couple days of relief. States it is also worse at the end of day. States if he lays down for long periods of time he has a lot of pain.       PERTINENT HISTORY:  Medial branch block and ablation 03/29/21, ACDF C5-7 2019   PAIN:  Are you having pain? Yes: NPRS scale: 6/10 Pain location: right low back  Pain description: achy, tender, tooth ache Aggravating factors: AM, bending, lifting and twisting, standing still . Relieving factors: sitting   PRECAUTIONS: None   WEIGHT BEARING RESTRICTIONS: No   FALLS:  Has patient fallen in last 6 months? No     OCCUPATION: laying flooring   PLOF: Independent   PATIENT GOALS: to have less pain   NEXT MD VISIT:    OBJECTIVE:    DIAGNOSTIC FINDINGS:  11/23/21 IMPRESSION: MRI cervical spine:   1. Interval ACDF at C5-C7 with improved (now patent) canal. 2. Mild progression of multilevel foraminal stenosis at a few, detailed above and severe on the right at C6-C7 and moderate on the left at C5-C6 and C6-C7. 3. Small focus of myelomalacia in the right eccentric cord at C6-C7 at the site of prior stenosis.   MRI thoracic spine:   Mild multilevel degenerative change without significant stenosis.   MRI lumbar spine:   1. When counting from the top, transitional lumbosacral anatomy with lumbarized S1. Correlation with radiographs is  recommended prior to any operative intervention. 2. Mild canal stenosis at L3-L4 and multilevel mild foraminal stenosis, as detailed above. No evidence of impingement.       SCREENING FOR RED FLAGS: Bowel or bladder incontinence: No Spinal tumors: No  Cauda equina syndrome: No Compression fracture: No Abdominal aneurysm: No   COGNITION: Overall cognitive status: Within functional limits for tasks assessed                          SENSATION: Not tested     POSTURE: increased thoracic kyphosis, posterior pelvic tilt, and flexed trunk    PALPATION: Tenderness to palpation along right glutes and lumbar paraspinals.    LUMBAR ROM:    AROM eval  Flexion 25% limited  Extension 75% limited  Right lateral flexion 50% limited  Left lateral flexion 50% limited  Right rotation    Left rotation     (Blank rows = not tested)       LE Measurements       Lower Extremity Right 01/22/2022 Left 01/22/2022    A/PROM MMT A/PROM MMT  Hip Flexion   3+   4-  Hip Extension 0 2+ Lacking 5 2  Hip Abduction          Hip Adduction          Hip Internal rotation 15   15    Hip External rotation 30   30    Knee Flexion   3+   4-  Knee Extension   3+   4-  Ankle Dorsiflexion          Ankle Plantarflexion          Ankle Inversion          Ankle Eversion           (Blank rows = not tested) * pain     LUMBAR SPECIAL TESTS:  Traction - slight relief, neg slump and SLR B, neg ely's test B,      GAIT: Distance walked: 25 ft Assistive device utilized: None Level of assistance: Modified independence Comments: limps on right, limited trunk and hip mobility noted   TODAY'S TREATMENT:                                                                                                                              DATE:   01/22/2022   Therapeutic Exercise:    Aerobic: Supine: LTR  on ball 2 minutes Prone: lying 2 minutes, on elbows 2 minutes    Seated:    Standing: Neuromuscular  Re-education: Manual Therapy: Therapeutic Activity: Self Care: Trigger Point Dry Needling:  Modalities:      PATIENT EDUCATION:  Education details: on current presentation, on HEP, on clinical outcomes score and POC Person educated: Patient Education method: Consulting civil engineer, Demonstration, and Handouts Education comprehension: verbalized understanding   HOME EXERCISE PROGRAM: [id number]   ASSESSMENT:   CLINICAL IMPRESSION: Patient presents with chronic low back pain that limits overall mobility and demonstrates bilateral weakness in legs with R>L. Session focused on education and importance of mobility to combat prolonged positions during the day. Patient would greatly benefit from skilled PT to improve  overall function and QOL.   OBJECTIVE IMPAIRMENTS: Abnormal gait, decreased activity tolerance, decreased endurance, decreased mobility, difficulty walking, decreased ROM, decreased strength, improper body mechanics, postural dysfunction, and pain.    ACTIVITY LIMITATIONS: carrying, lifting, bending, sitting, sleeping, bed mobility, and locomotion level   PARTICIPATION LIMITATIONS: driving, community activity, and occupation   PERSONAL FACTORS: Age and 1-2 comorbidities: chronic low back pain, ACDF  are also affecting patient's functional outcome.    REHAB POTENTIAL: Good   CLINICAL DECISION MAKING: Stable/uncomplicated   EVALUATION COMPLEXITY: Low   GOALS: Goals reviewed with patient? yes   SHORT TERM GOALS: Target date: 02/19/2022  Patient will be independent in self management strategies to improve quality of life and functional outcomes. Baseline: New Program Goal status: INITIAL   2.  Patient will report at least 50% improvement in overall symptoms and/or function to demonstrate improved functional mobility Baseline: 0% better Goal status: INITIAL   3.  Patient will be able to demonstrate at least 50% ROM in lumbar spine in all directions to demonstrate improved lumbar  mobility Baseline:  Goal status: INITIAL           LONG TERM GOALS: Target date: 03/19/2022    Patient will report at least 75% improvement in overall symptoms and/or function to demonstrate improved functional mobility Baseline: 0% better Goal status: INITIAL   2.  Patient will report being able to sleep without waking up secondly to back pain to improve sleep quality. Baseline:  Goal status: INITIAL   3.  Patient will report performing stretches throughout the day to reduce stress on lumbar spine.  Baseline:  Goal status: INITIAL     PLAN:   PT FREQUENCY: 1-2x/week   PT DURATION: 12 weeks   PLANNED INTERVENTIONS: Therapeutic exercises, Therapeutic activity, Neuromuscular re-education, Balance training, Gait training, Patient/Family education, Self Care, Joint mobilization, Joint manipulation, Vestibular training, Canalith repositioning, Orthotic/Fit training, Aquatic Therapy, Dry Needling, Electrical stimulation, Spinal manipulation, Spinal mobilization, Cryotherapy, Moist heat, Ultrasound, Ionotophoresis 21m/ml Dexamethasone, Manual therapy, and Re-evaluation.   PLAN FOR NEXT SESSION: lumbar and hip mobility, extension/rotation, traction   8:00 AM, 02/11/22 MJerene Pitch DPT [facility]

## 2022-02-12 ENCOUNTER — Other Ambulatory Visit (HOSPITAL_BASED_OUTPATIENT_CLINIC_OR_DEPARTMENT_OTHER): Payer: Self-pay

## 2022-02-12 MED ORDER — PREGABALIN 225 MG PO CAPS
225.0000 mg | ORAL_CAPSULE | Freq: Two times a day (BID) | ORAL | 2 refills | Status: DC
  Filled 2022-02-12: qty 60, 30d supply, fill #0
  Filled 2022-03-03 – 2022-05-07 (×2): qty 60, 30d supply, fill #1
  Filled 2022-09-09 (×2): qty 60, 30d supply, fill #2

## 2022-02-12 MED ORDER — TESTOSTERONE CYPIONATE 200 MG/ML IM SOLN: INTRAMUSCULAR | 1 refills | Status: DC

## 2022-02-12 NOTE — Telephone Encounter (Signed)
Okay for refill?    LOV   Last Refill       QTY.         Refills

## 2022-02-13 ENCOUNTER — Other Ambulatory Visit (HOSPITAL_BASED_OUTPATIENT_CLINIC_OR_DEPARTMENT_OTHER): Payer: Self-pay

## 2022-02-13 MED ORDER — ALBUTEROL SULFATE HFA 108 (90 BASE) MCG/ACT IN AERS
INHALATION_SPRAY | RESPIRATORY_TRACT | 0 refills | Status: DC
  Filled 2022-02-13: qty 6.7, 25d supply, fill #0

## 2022-02-14 ENCOUNTER — Encounter: Payer: 59 | Admitting: Physical Therapy

## 2022-02-18 ENCOUNTER — Encounter: Payer: 59 | Admitting: Physical Therapy

## 2022-02-20 ENCOUNTER — Encounter: Payer: 59 | Admitting: Physical Therapy

## 2022-02-21 NOTE — Progress Notes (Deleted)
   I, Peterson Lombard, LAT, ATC acting as a scribe for Lynne Leader, MD.  Mali E Hagemann is a 46 y.o. male who presents to [place] at [facility] today for f/u chronic LBP. 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 Pt had a medial branch block and ablation on 03/29/21. Pt was last seen by Dr. Georgina Snell on 01/14/22 and a lumbar ESI was ordered, never performed, and was referred to PT. Pt completed 1 PT visit and then was dismissed due to a no-show visit. Today, pt reports  Dx imaging: 11/23/21 T-spine, C-spine, & L-spine MRI             09/27/20 L-spine MRI             07/22/20 R hip MRI  Pertinent review of systems: ***  Relevant historical information: ***   Exam:  There were no vitals taken for this visit. General: Well Developed, well nourished, and in no acute distress.   MSK: ***    Lab and Radiology Results No results found for this or any previous visit (from the past 72 hour(s)). No results found.     Assessment and Plan: 46 y.o. male with ***   PDMP not reviewed this encounter. No orders of the defined types were placed in this encounter.  No orders of the defined types were placed in this encounter.    Discussed warning signs or symptoms. Please see discharge instructions. Patient expresses understanding.   ***

## 2022-02-25 ENCOUNTER — Ambulatory Visit: Payer: 59 | Admitting: Family Medicine

## 2022-02-25 ENCOUNTER — Encounter: Payer: 59 | Admitting: Physical Therapy

## 2022-02-28 ENCOUNTER — Encounter: Payer: 59 | Admitting: Physical Therapy

## 2022-03-03 ENCOUNTER — Other Ambulatory Visit: Payer: Self-pay | Admitting: Adult Health

## 2022-03-03 ENCOUNTER — Other Ambulatory Visit (HOSPITAL_BASED_OUTPATIENT_CLINIC_OR_DEPARTMENT_OTHER): Payer: Self-pay

## 2022-03-04 ENCOUNTER — Other Ambulatory Visit: Payer: Self-pay

## 2022-03-04 ENCOUNTER — Other Ambulatory Visit (HOSPITAL_BASED_OUTPATIENT_CLINIC_OR_DEPARTMENT_OTHER): Payer: Self-pay

## 2022-03-04 MED ORDER — SILDENAFIL CITRATE 20 MG PO TABS
20.0000 mg | ORAL_TABLET | ORAL | 0 refills | Status: AC
  Filled 2022-03-04: qty 90, 90d supply, fill #0
  Filled 2022-05-07: qty 90, 30d supply, fill #0
  Filled 2022-05-14: qty 90, 90d supply, fill #0

## 2022-03-05 ENCOUNTER — Other Ambulatory Visit (HOSPITAL_BASED_OUTPATIENT_CLINIC_OR_DEPARTMENT_OTHER): Payer: Self-pay

## 2022-03-05 MED ORDER — FENOFIBRATE 145 MG PO TABS
145.0000 mg | ORAL_TABLET | Freq: Every day | ORAL | 0 refills | Status: DC
  Filled 2022-03-05: qty 90, 90d supply, fill #0
  Filled 2022-05-07 – 2022-05-14 (×2): qty 30, 30d supply, fill #0
  Filled 2022-09-09: qty 30, 30d supply, fill #1
  Filled 2022-12-31: qty 30, 30d supply, fill #2

## 2022-03-05 MED ORDER — ALBUTEROL SULFATE HFA 108 (90 BASE) MCG/ACT IN AERS
INHALATION_SPRAY | RESPIRATORY_TRACT | 0 refills | Status: DC
  Filled 2022-03-05 – 2022-05-14 (×4): qty 6.7, 25d supply, fill #0

## 2022-03-20 ENCOUNTER — Other Ambulatory Visit (HOSPITAL_BASED_OUTPATIENT_CLINIC_OR_DEPARTMENT_OTHER): Payer: Self-pay

## 2022-03-20 MED ORDER — IBUPROFEN 800 MG PO TABS
800.0000 mg | ORAL_TABLET | Freq: Three times a day (TID) | ORAL | 0 refills | Status: AC | PRN
  Filled 2022-03-20: qty 90, 30d supply, fill #0

## 2022-03-20 MED ORDER — DICLOFENAC SODIUM 75 MG PO TBEC
75.0000 mg | DELAYED_RELEASE_TABLET | Freq: Two times a day (BID) | ORAL | 0 refills | Status: DC | PRN
  Filled 2022-03-20: qty 60, 30d supply, fill #0

## 2022-03-21 ENCOUNTER — Encounter: Payer: Self-pay | Admitting: Adult Health

## 2022-03-21 ENCOUNTER — Other Ambulatory Visit: Payer: Self-pay | Admitting: Adult Health

## 2022-03-21 ENCOUNTER — Other Ambulatory Visit (HOSPITAL_COMMUNITY): Payer: Self-pay

## 2022-03-21 MED ORDER — HYDROCODONE-ACETAMINOPHEN 5-325 MG PO TABS
1.0000 | ORAL_TABLET | Freq: Four times a day (QID) | ORAL | 0 refills | Status: AC | PRN
  Filled 2022-03-21: qty 28, 7d supply, fill #0

## 2022-03-21 NOTE — Telephone Encounter (Signed)
Patient wife calling checking on progress of this request.

## 2022-03-21 NOTE — Telephone Encounter (Signed)
Please advise 

## 2022-03-23 ENCOUNTER — Other Ambulatory Visit (HOSPITAL_BASED_OUTPATIENT_CLINIC_OR_DEPARTMENT_OTHER): Payer: Self-pay

## 2022-04-09 ENCOUNTER — Other Ambulatory Visit (HOSPITAL_BASED_OUTPATIENT_CLINIC_OR_DEPARTMENT_OTHER): Payer: Self-pay

## 2022-04-09 MED ORDER — HYDROCODONE-ACETAMINOPHEN 5-325 MG PO TABS
1.0000 | ORAL_TABLET | Freq: Two times a day (BID) | ORAL | 0 refills | Status: DC | PRN
  Filled 2022-04-09: qty 10, 5d supply, fill #0

## 2022-04-15 ENCOUNTER — Other Ambulatory Visit (HOSPITAL_BASED_OUTPATIENT_CLINIC_OR_DEPARTMENT_OTHER): Payer: Self-pay

## 2022-04-15 MED ORDER — HYDROCODONE-ACETAMINOPHEN 5-325 MG PO TABS
1.0000 | ORAL_TABLET | Freq: Two times a day (BID) | ORAL | 0 refills | Status: DC | PRN
  Filled 2022-04-15: qty 60, 30d supply, fill #0

## 2022-04-22 ENCOUNTER — Encounter: Payer: Self-pay | Admitting: Adult Health

## 2022-04-23 ENCOUNTER — Other Ambulatory Visit (HOSPITAL_BASED_OUTPATIENT_CLINIC_OR_DEPARTMENT_OTHER): Payer: Self-pay

## 2022-04-23 ENCOUNTER — Other Ambulatory Visit: Payer: Self-pay | Admitting: Adult Health

## 2022-04-23 MED ORDER — PANTOPRAZOLE SODIUM 40 MG PO TBEC
40.0000 mg | DELAYED_RELEASE_TABLET | Freq: Every day | ORAL | 3 refills | Status: DC
  Filled 2022-04-23: qty 30, 30d supply, fill #0
  Filled 2022-06-13: qty 30, 30d supply, fill #1
  Filled 2022-08-05: qty 30, 30d supply, fill #2
  Filled 2022-09-09: qty 30, 30d supply, fill #3
  Filled 2022-11-05: qty 30, 30d supply, fill #4
  Filled 2022-12-31: qty 30, 30d supply, fill #5
  Filled 2023-03-05: qty 30, 30d supply, fill #6
  Filled 2023-04-02: qty 30, 30d supply, fill #7

## 2022-04-23 NOTE — Telephone Encounter (Signed)
Ok to send in Rx?  

## 2022-05-07 ENCOUNTER — Other Ambulatory Visit: Payer: Self-pay

## 2022-05-07 ENCOUNTER — Other Ambulatory Visit (HOSPITAL_BASED_OUTPATIENT_CLINIC_OR_DEPARTMENT_OTHER): Payer: Self-pay

## 2022-05-07 ENCOUNTER — Other Ambulatory Visit: Payer: Self-pay | Admitting: Adult Health

## 2022-05-07 DIAGNOSIS — F32A Depression, unspecified: Secondary | ICD-10-CM

## 2022-05-07 MED ORDER — SERTRALINE HCL 100 MG PO TABS
200.0000 mg | ORAL_TABLET | Freq: Every day | ORAL | 1 refills | Status: DC
  Filled 2022-05-07: qty 60, 30d supply, fill #0
  Filled 2022-06-13: qty 60, 30d supply, fill #1
  Filled 2022-09-09: qty 60, 30d supply, fill #2
  Filled 2022-12-31: qty 60, 30d supply, fill #3

## 2022-05-10 ENCOUNTER — Other Ambulatory Visit: Payer: Self-pay | Admitting: Endocrinology

## 2022-05-10 ENCOUNTER — Other Ambulatory Visit (HOSPITAL_BASED_OUTPATIENT_CLINIC_OR_DEPARTMENT_OTHER): Payer: Self-pay

## 2022-05-12 ENCOUNTER — Telehealth: Payer: 59 | Admitting: Physician Assistant

## 2022-05-12 ENCOUNTER — Encounter (HOSPITAL_BASED_OUTPATIENT_CLINIC_OR_DEPARTMENT_OTHER): Payer: Self-pay | Admitting: Pharmacist

## 2022-05-12 ENCOUNTER — Other Ambulatory Visit (HOSPITAL_BASED_OUTPATIENT_CLINIC_OR_DEPARTMENT_OTHER): Payer: Self-pay

## 2022-05-12 DIAGNOSIS — J020 Streptococcal pharyngitis: Secondary | ICD-10-CM

## 2022-05-12 MED ORDER — AMOXICILLIN 500 MG PO CAPS
500.0000 mg | ORAL_CAPSULE | Freq: Two times a day (BID) | ORAL | 0 refills | Status: AC
  Filled 2022-05-12: qty 20, 10d supply, fill #0

## 2022-05-12 NOTE — Progress Notes (Signed)

## 2022-05-13 ENCOUNTER — Other Ambulatory Visit (HOSPITAL_BASED_OUTPATIENT_CLINIC_OR_DEPARTMENT_OTHER): Payer: Self-pay

## 2022-05-13 ENCOUNTER — Encounter (HOSPITAL_BASED_OUTPATIENT_CLINIC_OR_DEPARTMENT_OTHER): Payer: Self-pay

## 2022-05-14 ENCOUNTER — Other Ambulatory Visit (HOSPITAL_BASED_OUTPATIENT_CLINIC_OR_DEPARTMENT_OTHER): Payer: Self-pay

## 2022-05-15 ENCOUNTER — Other Ambulatory Visit (HOSPITAL_BASED_OUTPATIENT_CLINIC_OR_DEPARTMENT_OTHER): Payer: Self-pay

## 2022-05-15 ENCOUNTER — Other Ambulatory Visit: Payer: Self-pay | Admitting: Adult Health

## 2022-05-15 DIAGNOSIS — F32A Depression, unspecified: Secondary | ICD-10-CM

## 2022-05-15 MED ORDER — ALPRAZOLAM 0.5 MG PO TABS
0.5000 mg | ORAL_TABLET | Freq: Two times a day (BID) | ORAL | 2 refills | Status: DC
  Filled 2022-05-15: qty 60, 30d supply, fill #0
  Filled 2022-06-13: qty 60, 30d supply, fill #1
  Filled 2022-07-25 – 2022-07-31 (×2): qty 60, 30d supply, fill #2

## 2022-05-15 MED ORDER — HYDROCODONE-ACETAMINOPHEN 5-325 MG PO TABS
1.0000 | ORAL_TABLET | Freq: Two times a day (BID) | ORAL | 0 refills | Status: DC | PRN
  Filled 2022-05-15: qty 60, 30d supply, fill #0

## 2022-05-15 NOTE — Telephone Encounter (Signed)
Okay for refill?  

## 2022-05-17 ENCOUNTER — Other Ambulatory Visit (HOSPITAL_BASED_OUTPATIENT_CLINIC_OR_DEPARTMENT_OTHER): Payer: Self-pay

## 2022-06-13 ENCOUNTER — Other Ambulatory Visit (HOSPITAL_BASED_OUTPATIENT_CLINIC_OR_DEPARTMENT_OTHER): Payer: Self-pay

## 2022-06-13 ENCOUNTER — Other Ambulatory Visit: Payer: Self-pay

## 2022-06-13 MED ORDER — HYDROCODONE-ACETAMINOPHEN 5-325 MG PO TABS
1.0000 | ORAL_TABLET | Freq: Two times a day (BID) | ORAL | 0 refills | Status: DC | PRN
  Filled 2022-06-13 – 2022-06-14 (×2): qty 60, 30d supply, fill #0

## 2022-06-14 ENCOUNTER — Other Ambulatory Visit (HOSPITAL_BASED_OUTPATIENT_CLINIC_OR_DEPARTMENT_OTHER): Payer: Self-pay

## 2022-07-02 ENCOUNTER — Other Ambulatory Visit: Payer: Self-pay

## 2022-07-04 ENCOUNTER — Other Ambulatory Visit (HOSPITAL_BASED_OUTPATIENT_CLINIC_OR_DEPARTMENT_OTHER): Payer: Self-pay

## 2022-07-05 ENCOUNTER — Other Ambulatory Visit (HOSPITAL_COMMUNITY): Payer: Self-pay

## 2022-07-05 ENCOUNTER — Encounter: Payer: Self-pay | Admitting: Adult Health

## 2022-07-05 DIAGNOSIS — E890 Postprocedural hypothyroidism: Secondary | ICD-10-CM

## 2022-07-05 MED ORDER — LEVOTHYROXINE SODIUM 175 MCG PO TABS
175.0000 ug | ORAL_TABLET | Freq: Every day | ORAL | 2 refills | Status: DC
  Filled 2022-07-05: qty 30, 30d supply, fill #0
  Filled 2022-08-16: qty 30, 30d supply, fill #1
  Filled 2022-08-20: qty 30, 30d supply, fill #0
  Filled 2022-09-20: qty 30, 30d supply, fill #1
  Filled 2022-10-24: qty 30, 30d supply, fill #2
  Filled 2022-12-02: qty 30, 30d supply, fill #3
  Filled 2022-12-31: qty 30, 30d supply, fill #4
  Filled 2023-02-21: qty 30, 30d supply, fill #5
  Filled 2023-04-02: qty 30, 30d supply, fill #6
  Filled 2023-04-30: qty 30, 30d supply, fill #7

## 2022-07-15 ENCOUNTER — Other Ambulatory Visit (HOSPITAL_BASED_OUTPATIENT_CLINIC_OR_DEPARTMENT_OTHER): Payer: Self-pay

## 2022-07-15 MED ORDER — HYDROCODONE-ACETAMINOPHEN 5-325 MG PO TABS
1.0000 | ORAL_TABLET | Freq: Two times a day (BID) | ORAL | 0 refills | Status: DC | PRN
  Filled 2022-07-15: qty 60, 30d supply, fill #0

## 2022-07-16 ENCOUNTER — Other Ambulatory Visit (HOSPITAL_BASED_OUTPATIENT_CLINIC_OR_DEPARTMENT_OTHER): Payer: Self-pay

## 2022-07-25 ENCOUNTER — Other Ambulatory Visit: Payer: Self-pay

## 2022-07-25 ENCOUNTER — Other Ambulatory Visit (HOSPITAL_BASED_OUTPATIENT_CLINIC_OR_DEPARTMENT_OTHER): Payer: Self-pay

## 2022-07-25 ENCOUNTER — Other Ambulatory Visit (HOSPITAL_COMMUNITY): Payer: Self-pay

## 2022-07-31 ENCOUNTER — Other Ambulatory Visit (HOSPITAL_BASED_OUTPATIENT_CLINIC_OR_DEPARTMENT_OTHER): Payer: Self-pay

## 2022-07-31 ENCOUNTER — Other Ambulatory Visit (HOSPITAL_COMMUNITY): Payer: Self-pay

## 2022-08-13 ENCOUNTER — Other Ambulatory Visit (HOSPITAL_BASED_OUTPATIENT_CLINIC_OR_DEPARTMENT_OTHER): Payer: Self-pay

## 2022-08-13 MED ORDER — HYDROCODONE-ACETAMINOPHEN 5-325 MG PO TABS: ORAL_TABLET | ORAL | 0 refills | Status: DC

## 2022-08-13 MED ORDER — HYDROCODONE-ACETAMINOPHEN 5-325 MG PO TABS
1.0000 | ORAL_TABLET | Freq: Two times a day (BID) | ORAL | 0 refills | Status: DC | PRN
  Filled 2022-08-13: qty 60, 30d supply, fill #0

## 2022-08-20 ENCOUNTER — Other Ambulatory Visit (HOSPITAL_BASED_OUTPATIENT_CLINIC_OR_DEPARTMENT_OTHER): Payer: Self-pay

## 2022-08-20 ENCOUNTER — Other Ambulatory Visit (HOSPITAL_COMMUNITY): Payer: Self-pay

## 2022-09-09 ENCOUNTER — Other Ambulatory Visit: Payer: Self-pay

## 2022-09-09 ENCOUNTER — Other Ambulatory Visit (HOSPITAL_BASED_OUTPATIENT_CLINIC_OR_DEPARTMENT_OTHER): Payer: Self-pay

## 2022-09-09 ENCOUNTER — Other Ambulatory Visit: Payer: Self-pay | Admitting: Adult Health

## 2022-09-09 DIAGNOSIS — F419 Anxiety disorder, unspecified: Secondary | ICD-10-CM

## 2022-09-10 ENCOUNTER — Other Ambulatory Visit: Payer: Self-pay

## 2022-09-10 ENCOUNTER — Other Ambulatory Visit (HOSPITAL_BASED_OUTPATIENT_CLINIC_OR_DEPARTMENT_OTHER): Payer: Self-pay

## 2022-09-10 MED ORDER — ALPRAZOLAM 0.5 MG PO TABS
0.5000 mg | ORAL_TABLET | Freq: Two times a day (BID) | ORAL | 2 refills | Status: DC
Start: 2022-09-10 — End: 2023-01-07
  Filled 2022-09-10: qty 60, 30d supply, fill #0
  Filled 2022-10-10: qty 60, 30d supply, fill #1
  Filled 2022-11-05 – 2022-11-23 (×3): qty 60, 30d supply, fill #2

## 2022-09-10 MED ORDER — ALBUTEROL SULFATE HFA 108 (90 BASE) MCG/ACT IN AERS
INHALATION_SPRAY | RESPIRATORY_TRACT | 1 refills | Status: DC
  Filled 2022-09-10: qty 6.7, 25d supply, fill #0
  Filled 2022-12-31: qty 6.7, 25d supply, fill #1

## 2022-09-10 MED ORDER — BUSPIRONE HCL 5 MG PO TABS
5.0000 mg | ORAL_TABLET | Freq: Two times a day (BID) | ORAL | 1 refills | Status: AC
  Filled 2022-09-10: qty 60, 30d supply, fill #0
  Filled 2022-10-24: qty 60, 30d supply, fill #1
  Filled 2022-12-31: qty 60, 30d supply, fill #2
  Filled 2023-03-05: qty 60, 30d supply, fill #3
  Filled 2023-04-02: qty 60, 30d supply, fill #4
  Filled 2023-07-14: qty 60, 30d supply, fill #5

## 2022-09-10 NOTE — Telephone Encounter (Signed)
Okay for refill?  

## 2022-09-13 ENCOUNTER — Other Ambulatory Visit (HOSPITAL_BASED_OUTPATIENT_CLINIC_OR_DEPARTMENT_OTHER): Payer: Self-pay

## 2022-09-13 MED ORDER — HYDROCODONE-ACETAMINOPHEN 5-325 MG PO TABS
1.0000 | ORAL_TABLET | Freq: Two times a day (BID) | ORAL | 0 refills | Status: AC | PRN
  Filled 2022-09-13: qty 60, 30d supply, fill #0

## 2022-09-20 ENCOUNTER — Other Ambulatory Visit (HOSPITAL_BASED_OUTPATIENT_CLINIC_OR_DEPARTMENT_OTHER): Payer: Self-pay

## 2022-10-09 ENCOUNTER — Other Ambulatory Visit (HOSPITAL_COMMUNITY): Payer: Self-pay

## 2022-10-11 ENCOUNTER — Other Ambulatory Visit: Payer: Self-pay

## 2022-10-18 ENCOUNTER — Encounter: Payer: Self-pay | Admitting: Adult Health

## 2022-10-18 ENCOUNTER — Other Ambulatory Visit (HOSPITAL_BASED_OUTPATIENT_CLINIC_OR_DEPARTMENT_OTHER): Payer: Self-pay

## 2022-10-18 MED ORDER — HYDROCODONE-ACETAMINOPHEN 5-325 MG PO TABS
ORAL_TABLET | ORAL | 0 refills | Status: AC
  Filled 2022-10-18: qty 20, 21d supply, fill #0

## 2022-10-22 NOTE — Telephone Encounter (Signed)
Please advise 

## 2022-10-24 ENCOUNTER — Other Ambulatory Visit (HOSPITAL_BASED_OUTPATIENT_CLINIC_OR_DEPARTMENT_OTHER): Payer: Self-pay

## 2022-10-25 ENCOUNTER — Other Ambulatory Visit: Payer: Self-pay

## 2022-10-25 ENCOUNTER — Other Ambulatory Visit (HOSPITAL_BASED_OUTPATIENT_CLINIC_OR_DEPARTMENT_OTHER): Payer: Self-pay

## 2022-10-25 MED ORDER — DICLOFENAC SODIUM 75 MG PO TBEC
75.0000 mg | DELAYED_RELEASE_TABLET | Freq: Two times a day (BID) | ORAL | 2 refills | Status: AC | PRN
  Filled 2022-10-25: qty 60, 30d supply, fill #0
  Filled 2022-12-31: qty 60, 30d supply, fill #1

## 2022-11-02 ENCOUNTER — Other Ambulatory Visit (HOSPITAL_BASED_OUTPATIENT_CLINIC_OR_DEPARTMENT_OTHER): Payer: Self-pay

## 2022-11-06 ENCOUNTER — Other Ambulatory Visit (HOSPITAL_BASED_OUTPATIENT_CLINIC_OR_DEPARTMENT_OTHER): Payer: Self-pay

## 2022-11-06 ENCOUNTER — Other Ambulatory Visit: Payer: Self-pay

## 2022-11-08 ENCOUNTER — Other Ambulatory Visit: Payer: Self-pay

## 2022-11-21 ENCOUNTER — Other Ambulatory Visit (HOSPITAL_BASED_OUTPATIENT_CLINIC_OR_DEPARTMENT_OTHER): Payer: Self-pay

## 2022-11-23 ENCOUNTER — Other Ambulatory Visit (HOSPITAL_BASED_OUTPATIENT_CLINIC_OR_DEPARTMENT_OTHER): Payer: Self-pay

## 2022-12-02 ENCOUNTER — Other Ambulatory Visit (HOSPITAL_BASED_OUTPATIENT_CLINIC_OR_DEPARTMENT_OTHER): Payer: Self-pay

## 2022-12-06 ENCOUNTER — Encounter: Payer: Self-pay | Admitting: Adult Health

## 2022-12-09 NOTE — Telephone Encounter (Signed)
Pt's spouse asking that this be filled asap

## 2022-12-10 NOTE — Telephone Encounter (Addendum)
Pt's spouse called to F/U on this refill request.  Please send refill to: [facility] Phone: [phone]  Fax: [phone]

## 2022-12-10 NOTE — Telephone Encounter (Signed)
Pt wife is aware cory does not work on Monday and refill can take up to 3 business day

## 2022-12-11 NOTE — Telephone Encounter (Signed)
Pt was notified that this will need to come from urology and not [facility]. Pt verbalized understanding.

## 2022-12-18 ENCOUNTER — Telehealth: Payer: Self-pay | Admitting: Adult Health

## 2022-12-18 NOTE — Telephone Encounter (Signed)
Called pt and spouse number no answer. Will send to [facility] as FYI.

## 2022-12-18 NOTE — Telephone Encounter (Signed)
Pt's spouse called to inform NP that Pt is behaving very strangely since yesterday.  Pt has been experiencing Diarrhea... but also, an even more serious issue; extreme hallucinations. Pt did not sleep at all last night. Pt was up all night, seeing people that are not there.  Yelling at people that are not there. Went outside several times in the middle of the night, because he thought someone was trying to steal his truck. Spouse was offered to speak to Triage Nurse or see NP at 4 pm this afternoon.  Spouse decided to seek immediate medical attention and keep NP posted.

## 2022-12-31 ENCOUNTER — Other Ambulatory Visit: Payer: Self-pay | Admitting: Adult Health

## 2022-12-31 ENCOUNTER — Other Ambulatory Visit (HOSPITAL_BASED_OUTPATIENT_CLINIC_OR_DEPARTMENT_OTHER): Payer: Self-pay

## 2022-12-31 DIAGNOSIS — G894 Chronic pain syndrome: Secondary | ICD-10-CM

## 2022-12-31 DIAGNOSIS — F419 Anxiety disorder, unspecified: Secondary | ICD-10-CM

## 2022-12-31 DIAGNOSIS — I10 Essential (primary) hypertension: Secondary | ICD-10-CM

## 2023-01-01 ENCOUNTER — Other Ambulatory Visit: Payer: Self-pay

## 2023-01-04 ENCOUNTER — Other Ambulatory Visit (HOSPITAL_BASED_OUTPATIENT_CLINIC_OR_DEPARTMENT_OTHER): Payer: Self-pay

## 2023-01-04 ENCOUNTER — Other Ambulatory Visit: Payer: Self-pay | Admitting: Adult Health

## 2023-01-04 DIAGNOSIS — F419 Anxiety disorder, unspecified: Secondary | ICD-10-CM

## 2023-01-04 DIAGNOSIS — F32A Depression, unspecified: Secondary | ICD-10-CM

## 2023-01-06 ENCOUNTER — Other Ambulatory Visit (HOSPITAL_BASED_OUTPATIENT_CLINIC_OR_DEPARTMENT_OTHER): Payer: Self-pay

## 2023-01-06 ENCOUNTER — Other Ambulatory Visit: Payer: Self-pay | Admitting: Adult Health

## 2023-01-06 DIAGNOSIS — F32A Depression, unspecified: Secondary | ICD-10-CM

## 2023-01-06 NOTE — Telephone Encounter (Signed)
Tried to call pt again to advised that Rx was denied and to advised of our 72hr Rx protocol. Pt did not answer. Pt will have to wait for PCP to return tomorrow.

## 2023-01-06 NOTE — Telephone Encounter (Signed)
Tried to call pt to advised that I will send to another provider. However, its not guaranteed that it will get filled today. Pt did not answer. Sending to a provider.   Ok to fill for pt in the absence of PCP? Please advise

## 2023-01-06 NOTE — Telephone Encounter (Signed)
Pt has already been scheduled for a CPE on 01/08/26. Pt's spouse is asking that this refill be sent asap.  Pt's spouse informed NP is OOO on Mondays. Spouse is asking if another provider could please assist?

## 2023-01-06 NOTE — Telephone Encounter (Signed)
I usually only fill enough to get the patient to the next appt -- will cory be in the office tomorrow?

## 2023-01-07 ENCOUNTER — Other Ambulatory Visit (HOSPITAL_COMMUNITY): Payer: Self-pay

## 2023-01-07 ENCOUNTER — Other Ambulatory Visit: Payer: Self-pay | Admitting: Adult Health

## 2023-01-07 DIAGNOSIS — F32A Depression, unspecified: Secondary | ICD-10-CM

## 2023-01-07 MED ORDER — ALPRAZOLAM 0.5 MG PO TABS
0.5000 mg | ORAL_TABLET | Freq: Two times a day (BID) | ORAL | 0 refills | Status: DC
Start: 2023-01-07 — End: 2023-02-02
  Filled 2023-01-07 – 2023-01-10 (×2): qty 60, 30d supply, fill #0

## 2023-01-07 NOTE — Telephone Encounter (Signed)
Noted  

## 2023-01-09 ENCOUNTER — Ambulatory Visit: Payer: Managed Care, Other (non HMO) | Admitting: Adult Health

## 2023-01-09 ENCOUNTER — Other Ambulatory Visit (HOSPITAL_BASED_OUTPATIENT_CLINIC_OR_DEPARTMENT_OTHER): Payer: Self-pay

## 2023-01-09 ENCOUNTER — Encounter: Payer: Self-pay | Admitting: Adult Health

## 2023-01-09 ENCOUNTER — Encounter: Payer: Managed Care, Other (non HMO) | Admitting: Adult Health

## 2023-01-09 VITALS — BP 120/80 | HR 67 | Temp 98.0°F | Ht 67.0 in | Wt 205.0 lb

## 2023-01-09 DIAGNOSIS — E890 Postprocedural hypothyroidism: Secondary | ICD-10-CM

## 2023-01-09 DIAGNOSIS — F419 Anxiety disorder, unspecified: Secondary | ICD-10-CM | POA: Diagnosis not present

## 2023-01-09 DIAGNOSIS — Z0000 Encounter for general adult medical examination without abnormal findings: Secondary | ICD-10-CM

## 2023-01-09 DIAGNOSIS — R7303 Prediabetes: Secondary | ICD-10-CM

## 2023-01-09 DIAGNOSIS — E782 Mixed hyperlipidemia: Secondary | ICD-10-CM | POA: Diagnosis not present

## 2023-01-09 DIAGNOSIS — Z23 Encounter for immunization: Secondary | ICD-10-CM

## 2023-01-09 DIAGNOSIS — Z125 Encounter for screening for malignant neoplasm of prostate: Secondary | ICD-10-CM

## 2023-01-09 DIAGNOSIS — I10 Essential (primary) hypertension: Secondary | ICD-10-CM

## 2023-01-09 DIAGNOSIS — E291 Testicular hypofunction: Secondary | ICD-10-CM

## 2023-01-09 DIAGNOSIS — G8929 Other chronic pain: Secondary | ICD-10-CM

## 2023-01-09 DIAGNOSIS — Z720 Tobacco use: Secondary | ICD-10-CM

## 2023-01-09 DIAGNOSIS — M545 Low back pain, unspecified: Secondary | ICD-10-CM

## 2023-01-09 DIAGNOSIS — F32A Depression, unspecified: Secondary | ICD-10-CM

## 2023-01-09 DIAGNOSIS — K219 Gastro-esophageal reflux disease without esophagitis: Secondary | ICD-10-CM

## 2023-01-09 LAB — COMPREHENSIVE METABOLIC PANEL
ALT: 35 U/L (ref 0–53)
AST: 20 U/L (ref 0–37)
Albumin: 4.5 g/dL (ref 3.5–5.2)
Alkaline Phosphatase: 71 U/L (ref 39–117)
BUN: 16 mg/dL (ref 6–23)
CO2: 28 meq/L (ref 19–32)
Calcium: 9.7 mg/dL (ref 8.4–10.5)
Chloride: 103 meq/L (ref 96–112)
Creatinine, Ser: 0.81 mg/dL (ref 0.40–1.50)
GFR: 104.05 mL/min (ref >60.00)
Glucose, Bld: 102 mg/dL — ABNORMAL HIGH (ref 70–99)
Potassium: 4.3 meq/L (ref 3.5–5.1)
Sodium: 138 meq/L (ref 135–145)
Total Bilirubin: 0.3 mg/dL (ref 0.2–1.2)
Total Protein: 7 g/dL (ref 6.0–8.3)

## 2023-01-09 LAB — CBC
HCT: 42.7 % (ref 39.0–52.0)
Hemoglobin: 14.6 g/dL (ref 13.0–17.0)
MCHC: 34.1 g/dL (ref 30.0–36.0)
MCV: 97 fL (ref 78.0–100.0)
Platelets: 206 10*3/uL (ref 150.0–400.0)
RBC: 4.4 Mil/uL (ref 4.22–5.81)
RDW: 14.3 % (ref 11.5–15.5)
WBC: 9 10*3/uL (ref 4.0–10.5)

## 2023-01-09 LAB — LIPID PANEL
Cholesterol: 175 mg/dL (ref 0–200)
HDL: 44.6 mg/dL (ref >39.00)
Total CHOL/HDL Ratio: 4
Triglycerides: 412 mg/dL — ABNORMAL HIGH (ref 0.0–149.0)

## 2023-01-09 LAB — PSA: PSA: 0.37 ng/mL (ref 0.10–4.00)

## 2023-01-09 LAB — TSH: TSH: 0.94 u[IU]/mL (ref 0.35–5.50)

## 2023-01-09 LAB — LDL CHOLESTEROL, DIRECT: Direct LDL: 89 mg/dL

## 2023-01-09 LAB — HEMOGLOBIN A1C: Hgb A1c MFr Bld: 5.8 % (ref 4.6–6.5)

## 2023-01-09 MED ORDER — BUPROPION HCL ER (SR) 150 MG PO TB12
150.0000 mg | ORAL_TABLET | Freq: Two times a day (BID) | ORAL | 1 refills | Status: AC
  Filled 2023-01-09: qty 60, 30d supply, fill #0
  Filled 2023-04-02: qty 60, 30d supply, fill #1
  Filled 2023-04-30: qty 60, 30d supply, fill #2
  Filled 2023-05-28: qty 60, 30d supply, fill #3
  Filled 2023-07-14 – 2023-12-22 (×2): qty 60, 30d supply, fill #4

## 2023-01-09 NOTE — Patient Instructions (Addendum)
It was great seeing you today   We will follow up with you regarding your lab work   Please let me know if you need anything   I am going to put you on Wellbutrin to help with quitting smoking and depression.  Lets follow up in one month.

## 2023-01-09 NOTE — Progress Notes (Signed)
Subjective:    Patient ID: Chad Wilson, male    DOB: Dec 10, 1975, 47 y.o.   MRN: [id number]  HPI Patient presents for yearly preventative medicine examination.  Graves' disease- managed with synthroid 175 mcg. He feels well controlled.  Lab Results  Component Value Date   TSH 1.44 10/17/2021   Anxiety/depression-takes Zoloft 200 mg daily, BuSpar 5 mg twice daily, and Xanax 0.5 0.5 mg twice daily. He does feel as though his depression is slightly worse.  Hyperlipidemia-managed with Lipitor 40 mg daily and fenofibrate.   He denies myalgia or fatigue Lab Results  Component Value Date   CHOL 170 10/17/2021   HDL 34.90 (L) 10/17/2021   LDLCALC 66 10/30/2016   LDLDIRECT 109.0 10/17/2021   TRIG 223.0 (H) 10/17/2021   CHOLHDL 5 10/17/2021    GERD-controlled with Protonix 40 mg daily  Chronic back pain-is managed by pain management.  Currently prescribed Norco 5-325 mg every 6 hours. He also is prescribed tizanidine 2-4 mg QHS and Lyrica 225 mg BID ( this is prescribed by [place])   Hypogonadism -managed by urology. He has not had any testosterone in quite some time. Has an appointment with Urology   Tobacco use-continues to smoke.  He understands he needs to quit and is ready to quit. He would like to go on Wellbutrin to help him quit. He has tried chantix in the past but this caused sleep issues.   Hypertension - managed with lisinopril 40 mg daily.   He denies dizziness, lightheadedness, blurred vision, or headaches BP Readings from Last 3 Encounters:  01/09/23 120/80  01/14/22 132/84  10/16/21 120/80    Prediabetes - he was prescribed Metformin 500 mg daily   All immunizations and health maintenance protocols were reviewed with the patient and needed orders were placed. He will get flu shot today.   Appropriate screening laboratory values were ordered for the patient including screening of hyperlipidemia, renal function and hepatic function. If indicated by BPH, a PSA was  ordered.  Medication reconciliation,  past medical history, social history, problem list and allergies were reviewed in detail with the patient  Goals were established with regard to weight loss, exercise, and  diet in compliance with medications Wt Readings from Last 3 Encounters:  01/09/23 205 lb (93 kg)  01/14/22 207 lb (93.9 kg)  10/16/21 208 lb (94.3 kg)   He is up to date on routine colon cancer screening   Review of Systems  Constitutional: Negative.   HENT: Negative.    Eyes: Negative.   Respiratory:  Positive for cough and shortness of breath.   Cardiovascular: Negative.   Gastrointestinal: Negative.   Endocrine: Negative.   Genitourinary: Negative.   Musculoskeletal:  Positive for arthralgias and back pain.  Skin: Negative.   Allergic/Immunologic: Negative.   Neurological: Negative.   Hematological: Negative.   Psychiatric/Behavioral: Negative.    All other systems reviewed and are negative.  Past Medical History:  Diagnosis Date   Acute hepatitis C without mention of hepatic coma(070.51)    has been cleared    Acute postoperative pain 12/10/2018   Alcohol abuse, unspecified    Anxiety and depression    Anxiety states    Essential hypertension, benign    GERD (gastroesophageal reflux disease)    Graves disease    treated by Dr Everardo All   Panic disorder without agoraphobia    Thyroid disease    Graves Disease   Tobacco use disorder     Social History  Socioeconomic History   Marital status: Married    Spouse name: Not on file   Number of children: 0   Years of education: Not on file   Highest education level: Not on file  Occupational History    Employer: [employer]  Tobacco Use   Smoking status: Every Day    Current packs/day: 1.00    Average packs/day: 1 pack/day for 30.0 years (30.0 ttl pk-yrs)    Types: Cigarettes   Smokeless tobacco: Current    Types: Chew  Vaping Use   Vaping status: Never Used  Substance and Sexual  Activity   Alcohol use: Yes    Alcohol/week: 42.0 standard drinks of alcohol    Types: 42 Cans of beer per week    Comment: 6 pack of beer daily   Drug use: Not Currently    Comment: hx of cocaine  last dose 7 yrs ago   Sexual activity: Not on file  Other Topics Concern   Not on file  Social History Narrative   He is working in [employer]    Diet: Fruits and veggies, water poweraid, occ sweet tea   Social Determinants of Corporate investment banker Strain: Not on file  Food Insecurity: Not on file  Transportation Needs: Not on file  Physical Activity: Not on file  Stress: Not on file  Social Connections: Not on file  Intimate Partner Violence: Not on file    Past Surgical History:  Procedure Laterality Date   ANTERIOR CERVICAL DECOMP/DISCECTOMY FUSION N/A 05/21/2017   Procedure: Anterior Cervical Decompression Fusion Cervical five-six, Cervical six-seven;  Surgeon: Tia Alert, MD;  Location: [facility] OR;  Service: Neurosurgery;  Laterality: N/A;   MULTIPLE TOOTH EXTRACTIONS  15 yrs. ago   WISDOM TOOTH EXTRACTION      Family History  Problem Relation Age of Onset   Anxiety disorder Father    Heart disease Father    Hypertension Father    Thyroid disease Mother    Anxiety disorder Sister    Hyperthyroidism Sister    Colon cancer Neg Hx    Esophageal cancer Neg Hx    Pancreatic cancer Neg Hx     Allergies  Allergen Reactions   Chantix [Varenicline] Other (See Comments)    Agitation and sleep walking     Current Outpatient Medications on File Prior to Visit  Medication Sig Dispense Refill   albuterol (VENTOLIN HFA) 108 (90 Base) MCG/ACT inhaler INHALE 2 PUFFS INTO THE LUNGS EVERY 6 HOURS AS NEEDED. 6.7 g 1   ALPRAZolam (XANAX) 0.5 MG tablet Take 1 tablet (0.5 mg total) by mouth 2 (two) times daily. 60 tablet 0   atorvastatin (LIPITOR) 40 MG tablet Take 1 tablet (40 mg total) by mouth at bedtime. 90 tablet 3   busPIRone (BUSPAR) 5 MG tablet Take 1 tablet (5 mg total)  by mouth 2 (two) times daily. 180 tablet 1   Colchicine 0.6 MG CAPS Take 1 tablet by mouth 2 (two) times daily as needed. 60 capsule 3   diclofenac (VOLTAREN) 75 MG EC tablet Take 1 tablet (75 mg total) by mouth 2 (two) times daily as needed. 60 tablet 2   fenofibrate (TRICOR) 145 MG tablet Take 1 tablet (145 mg total) by mouth daily. 90 tablet 0   HYDROcodone-acetaminophen (NORCO/VICODIN) 5-325 MG tablet Take 1 tablet by mouth every 12 (twelve) hours as needed for pain. 60 tablet 0   HYDROcodone-acetaminophen (NORCO/VICODIN) 5-325 MG tablet Take 1 tablet by mouth daily  for 1-2 weeks, then 1/2 tablet by mouth daily for a week then stop. 20 tablet 0   ibuprofen (ADVIL) 800 MG tablet Take 1 tablet (800 mg total) by mouth 3 (three) times daily with food as needed. 90 tablet 0   levothyroxine (SYNTHROID) 175 MCG tablet Take 1 tablet (175 mcg total) by mouth daily before breakfast. 90 tablet 2   lisinopril (ZESTRIL) 40 MG tablet Take 1 tablet (40 mg total) by mouth daily. 90 tablet 3   metFORMIN (GLUCOPHAGE) 500 MG tablet Take 1 tablet (500 mg total) by mouth daily. 180 tablet 3   nabumetone (RELAFEN) 750 MG tablet Take 1 tablet (750 mg total) by mouth 2 (two) times daily as needed. 60 tablet 6   nitroGLYCERIN (NITRO-DUR) 0.1 mg/hr patch Apply 1/4 patch to affected area 12 hours daily 30 patch 3   nystatin-triamcinolone ointment (MYCOLOG) Apply 1 Application topically 2 (two) times daily. 60 g 2   pantoprazole (PROTONIX) 40 MG tablet Take 1 tablet (40 mg total) by mouth daily. 90 tablet 3   pregabalin (LYRICA) 225 MG capsule Take 1 capsule (225 mg total) by mouth 2 (two) times daily. 60 capsule 2   sertraline (ZOLOFT) 100 MG tablet Take 2 tablets (200 mg total) by mouth daily. 180 tablet 1   sildenafil (REVATIO) 20 MG tablet Take 1-5 tablets (20-100 mg total) by mouth as needed 90 tablet 0   testosterone cypionate (DEPOTESTOTERONE CYPIONATE) 100 MG/ML injection Inject 1 mL (100 mg total) into the muscle  once a week. 5 mL 1   tiZANidine (ZANAFLEX) 2 MG tablet Take 1-2 tablets (2-4 mg total) by mouth at bedtime. 60 tablet 2   [DISCONTINUED] omeprazole (PRILOSEC) 20 MG capsule Take 1 capsule (20 mg total) by mouth daily. 30 capsule 3   No current facility-administered medications on file prior to visit.    BP 120/80   Pulse 67   Temp 98 F (36.7 C) (Oral)   Ht 5\' 7"  (1.702 m)   Wt 205 lb (93 kg)   SpO2 98%   BMI 32.11 kg/m       Objective:   Physical Exam Vitals and nursing note reviewed.  Constitutional:      General: He is not in acute distress.    Appearance: Normal appearance. He is obese. He is not ill-appearing.  HENT:     Head: Normocephalic and atraumatic.     Right Ear: Tympanic membrane, ear canal and external ear normal. There is no impacted cerumen.     Left Ear: Tympanic membrane, ear canal and external ear normal. There is no impacted cerumen.     Nose: Nose normal. No congestion or rhinorrhea.     Mouth/Throat:     Mouth: Mucous membranes are moist.     Pharynx: Oropharynx is clear.  Eyes:     Extraocular Movements: Extraocular movements intact.     Conjunctiva/sclera: Conjunctivae normal.     Pupils: Pupils are equal, round, and reactive to light.  Neck:     Vascular: No carotid bruit.  Cardiovascular:     Rate and Rhythm: Normal rate and regular rhythm.     Pulses: Normal pulses.     Heart sounds: No murmur heard.    No friction rub. No gallop.  Pulmonary:     Effort: Pulmonary effort is normal.     Breath sounds: Normal breath sounds.  Abdominal:     General: Abdomen is flat. Bowel sounds are normal. There is no distension.  Palpations: Abdomen is soft. There is no mass.     Tenderness: There is no abdominal tenderness. There is no guarding or rebound.     Hernia: No hernia is present.  Musculoskeletal:        General: Normal range of motion.     Cervical back: Normal range of motion and neck supple.  Lymphadenopathy:     Cervical: No  cervical adenopathy.  Skin:    General: Skin is warm and dry.     Capillary Refill: Capillary refill takes less than 2 seconds.  Neurological:     General: No focal deficit present.     Mental Status: He is alert and oriented to person, place, and time.  Psychiatric:        Mood and Affect: Mood normal.        Behavior: Behavior normal.        Thought Content: Thought content normal.        Judgment: Judgment normal.        Assessment & Plan:  1. Routine general medical examination at a health care facility Today patient counseled on age appropriate routine health concerns for screening and prevention, each reviewed and up to date or declined. Immunizations reviewed and up to date or declined. Labs ordered and reviewed. Risk factors for depression reviewed and negative. Hearing function and visual acuity are intact. ADLs screened and addressed as needed. Functional ability and level of safety reviewed and appropriate. Education, counseling and referrals performed based on assessed risks today. Patient provided with a copy of personalized plan for preventive services. - Follow up in one year or sooner if needed - Nees to quit smoking  - Flu shot given today  2. Postablative hypothyroidism - Consider dose change on synthroid  - Lipid panel; Future - TSH; Future - CBC; Future - Comprehensive metabolic panel; Future  3. Anxiety and depression - Will add Wellbutrin to his regimen  - Follow up in one month  - Lipid panel; Future - TSH; Future - CBC; Future - Comprehensive metabolic panel; Future  4. Mixed hyperlipidemia - Consider increase in statin  - Lipid panel; Future - TSH; Future - CBC; Future - Comprehensive metabolic panel; Future  5. Gastroesophageal reflux disease without esophagitis - Continue PPI - Lipid panel; Future - TSH; Future - CBC; Future - Comprehensive metabolic panel; Future  6. Chronic midline low back pain without sciatica - Per Pain Management   - Lipid panel; Future - TSH; Future - CBC; Future - Comprehensive metabolic panel; Future  7. Hypogonadism in male - Per Urology  - Lipid panel; Future - TSH; Future - CBC; Future - Comprehensive metabolic panel; Future  8. Tobacco use - Will start him on Wellbutrin 150 mg BID - Follow up in one month  - Lipid panel; Future - TSH; Future - CBC; Future - Comprehensive metabolic panel; Future  9. Primary hypertension - Well controlled. No change in medication  - Lipid panel; Future - TSH; Future - CBC; Future - Comprehensive metabolic panel; Future  10. Prediabetes - Consider increase in metformin  - Lipid panel; Future - TSH; Future - CBC; Future - Comprehensive metabolic panel; Future - Hemoglobin A1c; Future  11. Prostate cancer screening  - PSA; Future  12. Need for influenza vaccination  - Flu vaccine trivalent PF, 6mos and older(Flulaval,Afluria,Fluarix,Fluzone)  Shirline Frees, NP

## 2023-01-09 NOTE — Progress Notes (Deleted)
Subjective:    Patient ID: Chad Wilson, male    DOB: 10-Jul-1975, 47 y.o.   MRN: [id number]  HPI Patient presents for yearly preventative medicine examination. He is a pleasant 47 year old male who  has a past medical history of Acute hepatitis C without mention of hepatic coma(070.51), Acute postoperative pain (12/10/2018), Alcohol abuse, unspecified, Anxiety and depression, Anxiety states, Essential hypertension, benign, GERD (gastroesophageal reflux disease), Graves disease, Panic disorder without agoraphobia, Thyroid disease, and Tobacco use disorder.  Graves' disease- managed with synthroid 175 mcg. He feels well controlled.   Anxiety/depression-takes Zoloft 200 mg daily, BuSpar 5 mg twice daily, and Xanax 0.5 0.5 mg twice daily.  He does feel as though his symptoms are well controlled  Hyperlipidemia-managed with Lipitor 40 mg daily and fenofibrate.   He denies myalgia or fatigue Lab Results  Component Value Date   CHOL 170 10/17/2021   HDL 34.90 (L) 10/17/2021   LDLCALC 66 10/30/2016   LDLDIRECT 109.0 10/17/2021   TRIG 223.0 (H) 10/17/2021   CHOLHDL 5 10/17/2021    GERD-controlled with Protonix 40 mg daily  Chronic back pain-is managed by pain management.  Currently prescribed Norco 5-325 mg every 6 hours. He also is prescribed tizanidine 2-4 mg QHS and Lyrica 225 mg BID ( this is prescribed by [place])   Hypogonadism -managed by urology.  Does testosterone injections every 14 days  Tobacco use-continues to smoke.  He understands he needs to quit  Hypertension - managed with lisinopril 40 mg daily.   He denies dizziness, lightheadedness, blurred vision, or headaches BP Readings from Last 3 Encounters:  01/14/22 132/84  10/16/21 120/80  10/10/21 138/80   Prediabetes - he was prescribed Metformin 500 mg daily   All immunizations and health maintenance protocols were reviewed with the patient and needed orders were placed.  Appropriate screening laboratory values were ordered  for the patient including screening of hyperlipidemia, renal function and hepatic function. If indicated by BPH, a PSA was ordered.  Medication reconciliation,  past medical history, social history, problem list and allergies were reviewed in detail with the patient  Goals were established with regard to weight loss, exercise, and  diet in compliance with medications  End of life planning was discussed.    Review of Systems     Objective:   Physical Exam        Assessment & Plan:

## 2023-01-10 ENCOUNTER — Other Ambulatory Visit: Payer: Self-pay | Admitting: Adult Health

## 2023-01-10 ENCOUNTER — Other Ambulatory Visit (HOSPITAL_COMMUNITY): Payer: Self-pay

## 2023-01-10 ENCOUNTER — Other Ambulatory Visit: Payer: Self-pay

## 2023-01-10 ENCOUNTER — Other Ambulatory Visit (HOSPITAL_BASED_OUTPATIENT_CLINIC_OR_DEPARTMENT_OTHER): Payer: Self-pay

## 2023-01-10 DIAGNOSIS — F419 Anxiety disorder, unspecified: Secondary | ICD-10-CM

## 2023-01-10 MED ORDER — ATORVASTATIN CALCIUM 40 MG PO TABS
40.0000 mg | ORAL_TABLET | Freq: Every day | ORAL | 3 refills | Status: AC
  Filled 2023-01-10 (×2): qty 30, 30d supply, fill #0
  Filled 2023-02-11: qty 30, 30d supply, fill #1
  Filled 2023-04-30: qty 30, 30d supply, fill #2
  Filled 2023-05-28: qty 30, 30d supply, fill #3
  Filled 2023-07-14 – 2023-10-14 (×2): qty 30, 30d supply, fill #4
  Filled 2023-12-22: qty 30, 30d supply, fill #5

## 2023-01-10 MED ORDER — SERTRALINE HCL 100 MG PO TABS
200.0000 mg | ORAL_TABLET | Freq: Every day | ORAL | 1 refills | Status: AC
  Filled 2023-01-10: qty 180, 90d supply, fill #0
  Filled 2023-02-11: qty 60, 30d supply, fill #0
  Filled 2023-04-02: qty 60, 30d supply, fill #1
  Filled 2023-04-30: qty 60, 30d supply, fill #2
  Filled 2023-05-28: qty 60, 30d supply, fill #3
  Filled 2023-07-14 – 2023-10-14 (×2): qty 60, 30d supply, fill #4
  Filled 2023-12-22: qty 60, 30d supply, fill #5

## 2023-01-10 MED ORDER — METFORMIN HCL 500 MG PO TABS
500.0000 mg | ORAL_TABLET | Freq: Every day | ORAL | 3 refills | Status: AC
  Filled 2023-01-10 (×2): qty 30, 30d supply, fill #0
  Filled 2023-02-11: qty 30, 30d supply, fill #1
  Filled 2023-04-02: qty 30, 30d supply, fill #2
  Filled 2023-04-30: qty 30, 30d supply, fill #3
  Filled 2023-05-28: qty 30, 30d supply, fill #4
  Filled 2023-07-14 – 2023-12-22 (×3): qty 30, 30d supply, fill #5

## 2023-01-10 MED ORDER — METFORMIN HCL 500 MG PO TABS
500.0000 mg | ORAL_TABLET | Freq: Every day | ORAL | 3 refills | Status: DC
  Filled 2023-01-10: qty 30, 30d supply, fill #0

## 2023-01-10 MED ORDER — FENOFIBRATE 145 MG PO TABS
145.0000 mg | ORAL_TABLET | Freq: Every day | ORAL | 3 refills | Status: AC
  Filled 2023-01-10: qty 90, 90d supply, fill #0
  Filled 2023-02-11: qty 30, 30d supply, fill #0
  Filled 2023-04-02: qty 30, 30d supply, fill #1
  Filled 2023-04-30: qty 30, 30d supply, fill #2
  Filled 2023-05-28: qty 30, 30d supply, fill #3
  Filled 2023-07-14 – 2023-12-22 (×3): qty 30, 30d supply, fill #4

## 2023-01-14 ENCOUNTER — Other Ambulatory Visit (HOSPITAL_COMMUNITY): Payer: Self-pay

## 2023-01-14 MED ORDER — TESTOSTERONE CYPIONATE 200 MG/ML IM SOLN
60.0000 mg | INTRAMUSCULAR | 1 refills | Status: AC
  Filled 2023-01-14: qty 10, 116d supply, fill #0
  Filled 2023-01-18: qty 8, 28d supply, fill #0
  Filled 2023-05-28: qty 8, 28d supply, fill #1

## 2023-01-16 ENCOUNTER — Other Ambulatory Visit (HOSPITAL_BASED_OUTPATIENT_CLINIC_OR_DEPARTMENT_OTHER): Payer: Self-pay

## 2023-01-16 MED ORDER — CYCLOBENZAPRINE HCL 5 MG PO TABS
5.0000 mg | ORAL_TABLET | Freq: Three times a day (TID) | ORAL | 0 refills | Status: AC
  Filled 2023-01-16: qty 15, 10d supply, fill #0

## 2023-01-18 ENCOUNTER — Other Ambulatory Visit (HOSPITAL_COMMUNITY): Payer: Self-pay

## 2023-01-18 ENCOUNTER — Other Ambulatory Visit (HOSPITAL_BASED_OUTPATIENT_CLINIC_OR_DEPARTMENT_OTHER): Payer: Self-pay

## 2023-01-20 ENCOUNTER — Other Ambulatory Visit (HOSPITAL_COMMUNITY): Payer: Self-pay

## 2023-02-02 ENCOUNTER — Other Ambulatory Visit: Payer: Self-pay | Admitting: Adult Health

## 2023-02-02 DIAGNOSIS — F419 Anxiety disorder, unspecified: Secondary | ICD-10-CM

## 2023-02-02 DIAGNOSIS — F32A Depression, unspecified: Secondary | ICD-10-CM

## 2023-02-04 ENCOUNTER — Other Ambulatory Visit (HOSPITAL_BASED_OUTPATIENT_CLINIC_OR_DEPARTMENT_OTHER): Payer: Self-pay

## 2023-02-04 MED ORDER — ALPRAZOLAM 0.5 MG PO TABS
0.5000 mg | ORAL_TABLET | Freq: Two times a day (BID) | ORAL | 2 refills | Status: DC
  Filled 2023-02-04 – 2023-02-11 (×3): qty 60, 30d supply, fill #0
  Filled 2023-03-05 – 2023-03-13 (×2): qty 60, 30d supply, fill #1
  Filled 2023-04-03 – 2023-04-11 (×3): qty 60, 30d supply, fill #2

## 2023-02-04 NOTE — Telephone Encounter (Signed)
Okay for refill?  

## 2023-02-05 ENCOUNTER — Other Ambulatory Visit: Payer: Self-pay

## 2023-02-05 ENCOUNTER — Other Ambulatory Visit (HOSPITAL_BASED_OUTPATIENT_CLINIC_OR_DEPARTMENT_OTHER): Payer: Self-pay

## 2023-02-11 ENCOUNTER — Other Ambulatory Visit (HOSPITAL_BASED_OUTPATIENT_CLINIC_OR_DEPARTMENT_OTHER): Payer: Self-pay

## 2023-02-11 ENCOUNTER — Other Ambulatory Visit: Payer: Self-pay

## 2023-02-20 ENCOUNTER — Ambulatory Visit: Payer: Managed Care, Other (non HMO) | Admitting: Adult Health

## 2023-02-20 NOTE — Progress Notes (Unsigned)
Subjective:    Patient ID: Chad Wilson, male    DOB: May 19, 1975, 47 y.o.   MRN: [id number]  HPI 47 year old male who  has a past medical history of Acute hepatitis C without mention of hepatic coma(070.51), Acute postoperative pain (12/10/2018), Alcohol abuse, unspecified, Anxiety and depression, Anxiety states, Essential hypertension, benign, GERD (gastroesophageal reflux disease), Graves disease, Panic disorder without agoraphobia, Thyroid disease, and Tobacco use disorder.   He presents to the office today for follow-up regarding depression and tobacco use.  He was seen at the end of October 2024 for his annual exam he reported that despite taking Zoloft 200 mg daily, BuSpar 5 mg twice daily and Xanax 0.5 mg twice daily he thought his depression was slightly worse.  Additionally he wanted to work on quitting smoking, he had been on Chantix in the past but this caused issues with sleeping so he did not want to go back on that medication.  We placed him on Wellbutrin 50 mg twice daily in the hopes that this would curb his cravings to smoke as well as help with his depression.  Review of Systems See HPI   Past Medical History:  Diagnosis Date   Acute hepatitis C without mention of hepatic coma(070.51)    has been cleared    Acute postoperative pain 12/10/2018   Alcohol abuse, unspecified    Anxiety and depression    Anxiety states    Essential hypertension, benign    GERD (gastroesophageal reflux disease)    Graves disease    treated by Dr Everardo All   Panic disorder without agoraphobia    Thyroid disease    Graves Disease   Tobacco use disorder     Social History   Socioeconomic History   Marital status: Married    Spouse name: Not on file   Number of children: 0   Years of education: Not on file   Highest education level: Not on file  Occupational History    Employer: [employer]  Tobacco Use   Smoking status: Every Day    Current packs/day: 1.00     Average packs/day: 1 pack/day for 30.0 years (30.0 ttl pk-yrs)    Types: Cigarettes   Smokeless tobacco: Current    Types: Chew  Vaping Use   Vaping status: Never Used  Substance and Sexual Activity   Alcohol use: Yes    Alcohol/week: 42.0 standard drinks of alcohol    Types: 42 Cans of beer per week    Comment: 6 pack of beer daily   Drug use: Not Currently    Comment: hx of cocaine  last dose 7 yrs ago   Sexual activity: Not on file  Other Topics Concern   Not on file  Social History Narrative   He is working in [employer] representative    Diet: Fruits and veggies, water poweraid, occ sweet tea   Social Determinants of Health   Financial Resource Strain: Low Risk  (01/16/2023)   Received from [facility]   Overall Financial Resource Strain (CARDIA)    Difficulty of Paying Living Expenses: Not hard at all  Food Insecurity: No Food Insecurity (01/16/2023)   Received from [facility]   Hunger Vital Sign    Worried About Running Out of Food in the Last Year: Never true    Ran Out of Food in the Last Year: Never true  Transportation Needs: No Transportation Needs (01/16/2023)   Received from [facility] - Transportation  Lack of Transportation (Medical): No    Lack of Transportation (Non-Medical): No  Physical Activity: Not on file  Stress: Not on file  Social Connections: Unknown (01/16/2023)   Received from [facility]   Social Network    Social Network: Not on file  Intimate Partner Violence: Unknown (01/16/2023)   Received from [facility]   HITS    Physically Hurt: Not on file    Insult or Talk Down To: Not on file    Threaten Physical Harm: Not on file    Scream or Curse: Not on file    Past Surgical History:  Procedure Laterality Date   ANTERIOR CERVICAL DECOMP/DISCECTOMY FUSION N/A 05/21/2017   Procedure: Anterior Cervical Decompression Fusion Cervical five-six, Cervical six-seven;  Surgeon: Tia Alert, MD;  Location: [facility] OR;  Service:  Neurosurgery;  Laterality: N/A;   MULTIPLE TOOTH EXTRACTIONS  15 yrs. ago   WISDOM TOOTH EXTRACTION      Family History  Problem Relation Age of Onset   Anxiety disorder Father    Heart disease Father    Hypertension Father    Thyroid disease Mother    Anxiety disorder Sister    Hyperthyroidism Sister    Colon cancer Neg Hx    Esophageal cancer Neg Hx    Pancreatic cancer Neg Hx     Allergies  Allergen Reactions   Chantix [Varenicline] Other (See Comments)    Agitation and sleep walking     Current Outpatient Medications on File Prior to Visit  Medication Sig Dispense Refill   albuterol (VENTOLIN HFA) 108 (90 Base) MCG/ACT inhaler INHALE 2 PUFFS INTO THE LUNGS EVERY 6 HOURS AS NEEDED. 6.7 g 1   ALPRAZolam (XANAX) 0.5 MG tablet Take 1 tablet (0.5 mg total) by mouth 2 (two) times daily. 60 tablet 2   atorvastatin (LIPITOR) 40 MG tablet Take 1 tablet (40 mg total) by mouth at bedtime. 90 tablet 3   buPROPion (WELLBUTRIN SR) 150 MG 12 hr tablet Take 1 tablet (150 mg total) by mouth 2 (two) times daily. 180 tablet 1   busPIRone (BUSPAR) 5 MG tablet Take 1 tablet (5 mg total) by mouth 2 (two) times daily. 180 tablet 1   Colchicine 0.6 MG CAPS Take 1 tablet by mouth 2 (two) times daily as needed. 60 capsule 3   cyclobenzaprine (FLEXERIL) 5 MG tablet Take 1 tablet (5 mg total) by mouth 3 (three) times daily as needed for Muscle spasms for up to 10 days. 15 tablet 0   diclofenac (VOLTAREN) 75 MG EC tablet Take 1 tablet (75 mg total) by mouth 2 (two) times daily as needed. 60 tablet 2   fenofibrate (TRICOR) 145 MG tablet Take 1 tablet (145 mg total) by mouth daily. 90 tablet 3   HYDROcodone-acetaminophen (NORCO/VICODIN) 5-325 MG tablet Take 1 tablet by mouth every 12 (twelve) hours as needed for pain. 60 tablet 0   HYDROcodone-acetaminophen (NORCO/VICODIN) 5-325 MG tablet Take 1 tablet by mouth daily for 1-2 weeks, then 1/2 tablet by mouth daily for a week then stop. 20 tablet 0    ibuprofen (ADVIL) 800 MG tablet Take 1 tablet (800 mg total) by mouth 3 (three) times daily with food as needed. 90 tablet 0   levothyroxine (SYNTHROID) 175 MCG tablet Take 1 tablet (175 mcg total) by mouth daily before breakfast. 90 tablet 2   lisinopril (ZESTRIL) 40 MG tablet Take 1 tablet (40 mg total) by mouth daily. 90 tablet 3   metFORMIN (GLUCOPHAGE) 500 MG  tablet Take 1 tablet (500 mg total) by mouth daily. 90 tablet 3   nabumetone (RELAFEN) 750 MG tablet Take 1 tablet (750 mg total) by mouth 2 (two) times daily as needed. 60 tablet 6   nitroGLYCERIN (NITRO-DUR) 0.1 mg/hr patch Apply 1/4 patch to affected area 12 hours daily 30 patch 3   nystatin-triamcinolone ointment (MYCOLOG) Apply 1 Application topically 2 (two) times daily. 60 g 2   pantoprazole (PROTONIX) 40 MG tablet Take 1 tablet (40 mg total) by mouth daily. 90 tablet 3   pregabalin (LYRICA) 225 MG capsule Take 1 capsule (225 mg total) by mouth 2 (two) times daily. 60 capsule 2   sertraline (ZOLOFT) 100 MG tablet Take 2 tablets (200 mg total) by mouth daily. 180 tablet 1   sildenafil (REVATIO) 20 MG tablet Take 1-5 tablets (20-100 mg total) by mouth as needed 90 tablet 0   testosterone cypionate (DEPOTESTOSTERONE CYPIONATE) 200 MG/ML injection Inject 0.3 mLs (60 mg total) into the muscle 2 (two) times a week (on Sundays and Wednesdays). 10 mL 1   testosterone cypionate (DEPOTESTOTERONE CYPIONATE) 100 MG/ML injection Inject 1 mL (100 mg total) into the muscle once a week. 5 mL 1   tiZANidine (ZANAFLEX) 2 MG tablet Take 1-2 tablets (2-4 mg total) by mouth at bedtime. 60 tablet 2   [DISCONTINUED] omeprazole (PRILOSEC) 20 MG capsule Take 1 capsule (20 mg total) by mouth daily. 30 capsule 3   No current facility-administered medications on file prior to visit.    There were no vitals taken for this visit.      Objective:   Physical Exam Vitals and nursing note reviewed.  Constitutional:      Appearance: Normal appearance.   Cardiovascular:     Rate and Rhythm: Normal rate and regular rhythm.     Pulses: Normal pulses.     Heart sounds: Normal heart sounds.  Pulmonary:     Breath sounds: Normal breath sounds.  Musculoskeletal:        General: Normal range of motion.  Skin:    General: Skin is warm and dry.  Neurological:     General: No focal deficit present.     Mental Status: He is alert and oriented to person, place, and time.  Psychiatric:        Mood and Affect: Mood normal.        Behavior: Behavior normal.        Thought Content: Thought content normal.        Judgment: Judgment normal.        Assessment & Plan:

## 2023-03-05 ENCOUNTER — Other Ambulatory Visit: Payer: Self-pay | Admitting: Adult Health

## 2023-03-05 ENCOUNTER — Other Ambulatory Visit (HOSPITAL_BASED_OUTPATIENT_CLINIC_OR_DEPARTMENT_OTHER): Payer: Self-pay

## 2023-03-06 ENCOUNTER — Other Ambulatory Visit (HOSPITAL_BASED_OUTPATIENT_CLINIC_OR_DEPARTMENT_OTHER): Payer: Self-pay

## 2023-03-06 ENCOUNTER — Other Ambulatory Visit: Payer: Self-pay

## 2023-03-06 MED ORDER — ALBUTEROL SULFATE HFA 108 (90 BASE) MCG/ACT IN AERS
INHALATION_SPRAY | RESPIRATORY_TRACT | 1 refills | Status: DC
  Filled 2023-03-06: qty 6.7, 25d supply, fill #0
  Filled 2023-04-07: qty 6.7, 25d supply, fill #1

## 2023-03-14 ENCOUNTER — Other Ambulatory Visit: Payer: Self-pay

## 2023-04-02 ENCOUNTER — Other Ambulatory Visit (HOSPITAL_BASED_OUTPATIENT_CLINIC_OR_DEPARTMENT_OTHER): Payer: Self-pay

## 2023-04-02 ENCOUNTER — Other Ambulatory Visit: Payer: Self-pay | Admitting: Adult Health

## 2023-04-02 ENCOUNTER — Other Ambulatory Visit: Payer: Self-pay

## 2023-04-02 DIAGNOSIS — I10 Essential (primary) hypertension: Secondary | ICD-10-CM

## 2023-04-02 DIAGNOSIS — G894 Chronic pain syndrome: Secondary | ICD-10-CM

## 2023-04-02 MED ORDER — LISINOPRIL 40 MG PO TABS
40.0000 mg | ORAL_TABLET | Freq: Every day | ORAL | 3 refills | Status: AC
  Filled 2023-04-02: qty 30, 30d supply, fill #0
  Filled 2023-04-30: qty 30, 30d supply, fill #1
  Filled 2023-05-28: qty 30, 30d supply, fill #2
  Filled 2023-07-14 – 2023-10-14 (×2): qty 30, 30d supply, fill #3
  Filled 2023-12-22: qty 30, 30d supply, fill #4

## 2023-04-03 ENCOUNTER — Other Ambulatory Visit (HOSPITAL_BASED_OUTPATIENT_CLINIC_OR_DEPARTMENT_OTHER): Payer: Self-pay

## 2023-04-08 ENCOUNTER — Other Ambulatory Visit: Payer: Self-pay

## 2023-04-08 ENCOUNTER — Other Ambulatory Visit (HOSPITAL_BASED_OUTPATIENT_CLINIC_OR_DEPARTMENT_OTHER): Payer: Self-pay

## 2023-04-15 ENCOUNTER — Other Ambulatory Visit (HOSPITAL_BASED_OUTPATIENT_CLINIC_OR_DEPARTMENT_OTHER): Payer: Self-pay

## 2023-04-15 ENCOUNTER — Other Ambulatory Visit: Payer: Self-pay

## 2023-04-15 MED ORDER — PREGABALIN 100 MG PO CAPS
100.0000 mg | ORAL_CAPSULE | Freq: Two times a day (BID) | ORAL | 2 refills | Status: AC
  Filled 2023-04-15: qty 60, 30d supply, fill #0
  Filled 2023-05-28: qty 60, 30d supply, fill #1

## 2023-04-30 ENCOUNTER — Other Ambulatory Visit: Payer: Self-pay | Admitting: Adult Health

## 2023-04-30 ENCOUNTER — Other Ambulatory Visit: Payer: Self-pay

## 2023-04-30 ENCOUNTER — Other Ambulatory Visit (HOSPITAL_BASED_OUTPATIENT_CLINIC_OR_DEPARTMENT_OTHER): Payer: Self-pay

## 2023-05-01 ENCOUNTER — Other Ambulatory Visit (HOSPITAL_BASED_OUTPATIENT_CLINIC_OR_DEPARTMENT_OTHER): Payer: Self-pay

## 2023-05-01 MED ORDER — PANTOPRAZOLE SODIUM 40 MG PO TBEC
40.0000 mg | DELAYED_RELEASE_TABLET | Freq: Every day | ORAL | 3 refills | Status: AC
  Filled 2023-05-01: qty 30, 30d supply, fill #0
  Filled 2023-06-13: qty 30, 30d supply, fill #1
  Filled 2023-07-14: qty 30, 30d supply, fill #2
  Filled 2023-10-14 – 2023-12-22 (×2): qty 30, 30d supply, fill #3

## 2023-05-01 MED ORDER — ALBUTEROL SULFATE HFA 108 (90 BASE) MCG/ACT IN AERS
2.0000 | INHALATION_SPRAY | Freq: Four times a day (QID) | RESPIRATORY_TRACT | 1 refills | Status: AC | PRN
  Filled 2023-05-01: qty 6.7, 25d supply, fill #0

## 2023-05-13 ENCOUNTER — Other Ambulatory Visit: Payer: Self-pay | Admitting: Adult Health

## 2023-05-13 DIAGNOSIS — F419 Anxiety disorder, unspecified: Secondary | ICD-10-CM

## 2023-05-14 ENCOUNTER — Other Ambulatory Visit (HOSPITAL_BASED_OUTPATIENT_CLINIC_OR_DEPARTMENT_OTHER): Payer: Self-pay

## 2023-05-14 MED ORDER — ALPRAZOLAM 0.5 MG PO TABS
0.5000 mg | ORAL_TABLET | Freq: Two times a day (BID) | ORAL | 2 refills | Status: DC
  Filled 2023-05-14: qty 60, 30d supply, fill #0
  Filled 2023-06-13: qty 60, 30d supply, fill #1
  Filled 2023-07-14: qty 60, 30d supply, fill #2

## 2023-05-14 NOTE — Telephone Encounter (Signed)
 Okay for refill?

## 2023-05-28 ENCOUNTER — Other Ambulatory Visit (HOSPITAL_BASED_OUTPATIENT_CLINIC_OR_DEPARTMENT_OTHER): Payer: Self-pay

## 2023-05-28 ENCOUNTER — Other Ambulatory Visit: Payer: Self-pay

## 2023-05-28 ENCOUNTER — Other Ambulatory Visit: Payer: Self-pay | Admitting: Adult Health

## 2023-05-28 DIAGNOSIS — E890 Postprocedural hypothyroidism: Secondary | ICD-10-CM

## 2023-05-28 MED ORDER — LEVOTHYROXINE SODIUM 175 MCG PO TABS
175.0000 ug | ORAL_TABLET | Freq: Every day | ORAL | 2 refills | Status: AC
  Filled 2023-05-28: qty 30, 30d supply, fill #0
  Filled 2023-07-14: qty 30, 30d supply, fill #1
  Filled 2023-10-14 – 2023-12-22 (×2): qty 30, 30d supply, fill #2

## 2023-06-13 ENCOUNTER — Other Ambulatory Visit: Payer: Self-pay

## 2023-07-14 ENCOUNTER — Other Ambulatory Visit (HOSPITAL_BASED_OUTPATIENT_CLINIC_OR_DEPARTMENT_OTHER): Payer: Self-pay

## 2023-07-14 ENCOUNTER — Other Ambulatory Visit: Payer: Self-pay

## 2023-07-23 ENCOUNTER — Other Ambulatory Visit (HOSPITAL_BASED_OUTPATIENT_CLINIC_OR_DEPARTMENT_OTHER): Payer: Self-pay

## 2023-07-28 ENCOUNTER — Other Ambulatory Visit (HOSPITAL_BASED_OUTPATIENT_CLINIC_OR_DEPARTMENT_OTHER): Payer: Self-pay

## 2023-07-31 ENCOUNTER — Other Ambulatory Visit (HOSPITAL_BASED_OUTPATIENT_CLINIC_OR_DEPARTMENT_OTHER): Payer: Self-pay

## 2023-08-04 ENCOUNTER — Other Ambulatory Visit (HOSPITAL_BASED_OUTPATIENT_CLINIC_OR_DEPARTMENT_OTHER): Payer: Self-pay

## 2023-08-19 ENCOUNTER — Other Ambulatory Visit (HOSPITAL_BASED_OUTPATIENT_CLINIC_OR_DEPARTMENT_OTHER): Payer: Self-pay

## 2023-10-13 ENCOUNTER — Other Ambulatory Visit (HOSPITAL_BASED_OUTPATIENT_CLINIC_OR_DEPARTMENT_OTHER): Payer: Self-pay

## 2023-10-14 ENCOUNTER — Other Ambulatory Visit: Payer: Self-pay | Admitting: Adult Health

## 2023-10-14 DIAGNOSIS — F419 Anxiety disorder, unspecified: Secondary | ICD-10-CM

## 2023-10-15 ENCOUNTER — Other Ambulatory Visit: Payer: Self-pay

## 2023-10-15 ENCOUNTER — Other Ambulatory Visit (HOSPITAL_BASED_OUTPATIENT_CLINIC_OR_DEPARTMENT_OTHER): Payer: Self-pay

## 2023-10-15 MED ORDER — ALPRAZOLAM 0.5 MG PO TABS
0.5000 mg | ORAL_TABLET | Freq: Two times a day (BID) | ORAL | 2 refills | Status: AC
  Filled 2023-10-15: qty 60, 30d supply, fill #0
  Filled 2023-11-12: qty 60, 30d supply, fill #1
  Filled 2023-12-15: qty 60, 30d supply, fill #2

## 2023-10-15 NOTE — Telephone Encounter (Signed)
 Okay for refill?

## 2023-10-17 ENCOUNTER — Ambulatory Visit: Admitting: Adult Health

## 2023-10-17 NOTE — Progress Notes (Deleted)
 Subjective:    Patient ID: Chad Wilson, male    DOB: 12-19-1975, 48 y.o.   MRN: [id number]  HPI 48 year old male who  has a past medical history of Acute hepatitis C without mention of hepatic coma(070.51), Acute postoperative pain (12/10/2018), Alcohol abuse, unspecified, Anxiety and depression, Anxiety states, Essential hypertension, benign, GERD (gastroesophageal reflux disease), Graves disease, Panic disorder without agoraphobia, Thyroid  disease, and Tobacco use disorder.  He was in [place]  last month for alcohol rehab and while out there had to be seen in the emergency room for evaluation of generalized fatigue, weakness and feeling dizzy over a few days.  Patient felt as though he was going through withdrawal again.  Denied any chest pain or shortness of breath  In the hospital his labs showed no significant leukocytosis, mild anemia at 13.5 and a mildly elevated bicarb of 33.  Is negative for alcohol and toxicology was positive for benzodiazepines only, he does have a prescription for Xanax .  This x-ray was negative for acute disease and CT of the head showed no acute findings.  EKG did show normal sinus rhythm with T wave inversions in the inferior leads without reciprocal ST segment elevations  Patient was placed on a monitor he was noted to be satting in the mid 80s on room air.  He was placed on 2 L nasal cannula with O2 saturation of 93%.  His lungs were clear to auscultation bilaterally.  He is a heavy everyday smoker that his decreased oxygen saturations were secondary to COPD.  Given his EKG changes without reported prior history of cardiac disease he was advised to be admitted to the hospital for further workup of management of his dizziness and acute hypoxic respiratory failure but he signed out AMA.   Review of Systems See HPI   Past Medical History:  Diagnosis Date   Acute hepatitis C without mention of hepatic coma(070.51)    has been cleared    Acute postoperative  pain 12/10/2018   Alcohol abuse, unspecified    Anxiety and depression    Anxiety states    Essential hypertension, benign    GERD (gastroesophageal reflux disease)    Graves disease    treated by Dr Kassie   Panic disorder without agoraphobia    Thyroid  disease    Graves Disease   Tobacco use disorder     Social History   Socioeconomic History   Marital status: Married    Spouse name: Not on file   Number of children: 0   Years of education: Not on file   Highest education level: Not on file  Occupational History    Employer: [employer]  Tobacco Use   Smoking status: Every Day    Current packs/day: 1.00    Average packs/day: 1 pack/day for 30.0 years (30.0 ttl pk-yrs)    Types: Cigarettes   Smokeless tobacco: Current    Types: Chew  Vaping Use   Vaping status: Never Used  Substance and Sexual Activity   Alcohol use: Yes    Alcohol/week: 42.0 standard drinks of alcohol    Types: 42 Cans of beer per week    Comment: 6 pack of beer daily   Drug use: Not Currently    Comment: hx of cocaine  last dose 7 yrs ago   Sexual activity: Not on file  Other Topics Concern   Not on file  Social History Narrative   He is working in [employer]    Diet:  Fruits and veggies, water poweraid, occ sweet tea   Social Drivers of Corporate investment banker Strain: Low Risk  (04/15/2023)   Received from [facility]   Overall Financial Resource Strain (CARDIA)    Difficulty of Paying Living Expenses: Not very hard  Food Insecurity: No Food Insecurity (04/15/2023)   Received from [facility]   Hunger Vital Sign    Within the past 12 months, you worried that your food would run out before you got the money to buy more.: Never true    Within the past 12 months, the food you bought just didn't last and you didn't have money to get more.: Never true  Transportation Needs: No Transportation Needs (04/15/2023)   Received from [facility] - Transportation     Lack of Transportation (Medical): No    Lack of Transportation (Non-Medical): No  Physical Activity: Not on file  Stress: Not on file  Social Connections: Unknown (01/16/2023)   Received from [facility]   Social Network    Social Network: Not on file  Intimate Partner Violence: Unknown (01/16/2023)   Received from [facility]   HITS    Physically Hurt: Not on file    Insult or Talk Down To: Not on file    Threaten Physical Harm: Not on file    Scream or Curse: Not on file    Past Surgical History:  Procedure Laterality Date   ANTERIOR CERVICAL DECOMP/DISCECTOMY FUSION N/A 05/21/2017   Procedure: Anterior Cervical Decompression Fusion Cervical five-six, Cervical six-seven;  Surgeon: Joshua Alm RAMAN, MD;  Location: [facility] OR;  Service: Neurosurgery;  Laterality: N/A;   MULTIPLE TOOTH EXTRACTIONS  15 yrs. ago   WISDOM TOOTH EXTRACTION      Family History  Problem Relation Age of Onset   Anxiety disorder Father    Heart disease Father    Hypertension Father    Thyroid  disease Mother    Anxiety disorder Sister    Hyperthyroidism Sister    Colon cancer Neg Hx    Esophageal cancer Neg Hx    Pancreatic cancer Neg Hx     Allergies  Allergen Reactions   Chantix  [Varenicline ] Other (See Comments)    Agitation and sleep walking     Current Outpatient Medications on File Prior to Visit  Medication Sig Dispense Refill   albuterol  (VENTOLIN  HFA) 108 (90 Base) MCG/ACT inhaler Inhale 2 puffs into the lungs every 6 (six) hours as needed. 6.7 g 1   ALPRAZolam  (XANAX ) 0.5 MG tablet Take 1 tablet (0.5 mg total) by mouth 2 (two) times daily. 60 tablet 2   atorvastatin  (LIPITOR) 40 MG tablet Take 1 tablet (40 mg total) by mouth at bedtime. 90 tablet 3   buPROPion  (WELLBUTRIN  SR) 150 MG 12 hr tablet Take 1 tablet (150 mg total) by mouth 2 (two) times daily. 180 tablet 1   busPIRone  (BUSPAR ) 5 MG tablet Take 1 tablet (5 mg total) by mouth 2 (two) times daily. 180 tablet 1   Colchicine  0.6 MG  CAPS Take 1 tablet by mouth 2 (two) times daily as needed. 60 capsule 3   cyclobenzaprine  (FLEXERIL ) 5 MG tablet Take 1 tablet (5 mg total) by mouth 3 (three) times daily as needed for Muscle spasms for up to 10 days. 15 tablet 0   diclofenac  (VOLTAREN ) 75 MG EC tablet Take 1 tablet (75 mg total) by mouth 2 (two) times daily as needed. 60 tablet 2   fenofibrate  (TRICOR ) 145 MG  tablet Take 1 tablet (145 mg total) by mouth daily. 90 tablet 3   HYDROcodone -acetaminophen  (NORCO/VICODIN) 5-325 MG tablet Take 1 tablet by mouth every 12 (twelve) hours as needed for pain. 60 tablet 0   HYDROcodone -acetaminophen  (NORCO/VICODIN) 5-325 MG tablet Take 1 tablet by mouth daily for 1-2 weeks, then 1/2 tablet by mouth daily for a week then stop. 20 tablet 0   ibuprofen  (ADVIL ) 800 MG tablet Take 1 tablet (800 mg total) by mouth 3 (three) times daily with food as needed. 90 tablet 0   levothyroxine  (SYNTHROID ) 175 MCG tablet Take 1 tablet (175 mcg total) by mouth daily before breakfast. 90 tablet 2   lisinopril  (ZESTRIL ) 40 MG tablet Take 1 tablet (40 mg total) by mouth daily. 90 tablet 3   metFORMIN  (GLUCOPHAGE ) 500 MG tablet Take 1 tablet (500 mg total) by mouth daily. 90 tablet 3   nabumetone  (RELAFEN ) 750 MG tablet Take 1 tablet (750 mg total) by mouth 2 (two) times daily as needed. 60 tablet 6   nitroGLYCERIN  (NITRO-DUR ) 0.1 mg/hr patch Apply 1/4 patch to affected area 12 hours daily 30 patch 3   nystatin -triamcinolone  ointment (MYCOLOG) Apply 1 Application topically 2 (two) times daily. 60 g 2   pantoprazole  (PROTONIX ) 40 MG tablet Take 1 tablet (40 mg total) by mouth daily. 90 tablet 3   pregabalin  (LYRICA ) 100 MG capsule Take one capsule (100 mg dose) by mouth 2 (two) times daily for 30 days. 60 capsule 2   pregabalin  (LYRICA ) 225 MG capsule Take 1 capsule (225 mg total) by mouth 2 (two) times daily. 60 capsule 2   sertraline  (ZOLOFT ) 100 MG tablet Take 2 tablets (200 mg total) by mouth daily. 180 tablet 1    sildenafil  (REVATIO ) 20 MG tablet Take 1-5 tablets (20-100 mg total) by mouth as needed 90 tablet 0   testosterone  cypionate (DEPOTESTOSTERONE CYPIONATE) 200 MG/ML injection Inject 0.3 mLs (60 mg total) into the muscle 2 (two) times a week (on Sundays and Wednesdays). Single use vials, discard after using. 10 mL 1   testosterone  cypionate (DEPOTESTOTERONE CYPIONATE) 100 MG/ML injection Inject 1 mL (100 mg total) into the muscle once a week. 5 mL 1   tiZANidine  (ZANAFLEX ) 2 MG tablet Take 1-2 tablets (2-4 mg total) by mouth at bedtime. 60 tablet 2   [DISCONTINUED] omeprazole  (PRILOSEC) 20 MG capsule Take 1 capsule (20 mg total) by mouth daily. 30 capsule 3   No current facility-administered medications on file prior to visit.    There were no vitals taken for this visit.      Objective:   Physical Exam        Assessment & Plan:

## 2023-10-22 ENCOUNTER — Ambulatory Visit: Admitting: Adult Health

## 2023-10-23 ENCOUNTER — Ambulatory Visit: Admitting: Adult Health

## 2023-11-02 ENCOUNTER — Encounter (HOSPITAL_BASED_OUTPATIENT_CLINIC_OR_DEPARTMENT_OTHER): Payer: Self-pay

## 2023-11-02 ENCOUNTER — Emergency Department (HOSPITAL_BASED_OUTPATIENT_CLINIC_OR_DEPARTMENT_OTHER)
Admission: EM | Admit: 2023-11-02 | Discharge: 2023-11-02 | Disposition: A | Discharge status: Home / Self Care | Attending: Emergency Medicine | Admitting: Emergency Medicine

## 2023-11-02 DIAGNOSIS — M5442 Lumbago with sciatica, left side: Secondary | ICD-10-CM | POA: Insufficient documentation

## 2023-11-02 MED ORDER — KETOROLAC TROMETHAMINE 15 MG/ML IJ SOLN
15.0000 mg | Freq: Once | INTRAMUSCULAR | Status: AC
  Administered 2023-11-02: 15 mg via INTRAMUSCULAR
  Filled 2023-11-02: qty 1

## 2023-11-02 MED ORDER — CYCLOBENZAPRINE HCL 10 MG PO TABS: 10.0000 mg | ORAL_TABLET | Freq: Two times a day (BID) | ORAL | 0 refills | Status: AC | PRN

## 2023-11-02 NOTE — ED Provider Notes (Signed)
 [place] EMERGENCY DEPARTMENT AT [facility] Provider Note   CSN: [id number] Arrival date & time: 11/02/23  9166     Patient presents with: Leg Pain   Chad Wilson is a 48 y.o. male.   Patient presents with pain in the left buttocks rating down to the knee.  Patient is history of chronic back pain and disc herniation.  No surgery history.  Patient does not have a cold leg no fevers no specific injuries however patient was bending forward this happened.  Discomfort with movement.  No difficulty with urination, no change in sensation with wiping.  The history is provided by the patient.  Leg Pain Associated symptoms: back pain   Associated symptoms: no fever and no neck pain        Prior to Admission medications   Medication Sig Start Date End Date Taking? Authorizing Provider  albuterol  (VENTOLIN  HFA) 108 (90 Base) MCG/ACT inhaler Inhale 2 puffs into the lungs every 6 (six) hours as needed. 05/01/23   Nafziger, Darleene, NP  ALPRAZolam  (XANAX ) 0.5 MG tablet Take 1 tablet (0.5 mg total) by mouth 2 (two) times daily. 10/15/23   Nafziger, Darleene, NP  atorvastatin  (LIPITOR) 40 MG tablet Take 1 tablet (40 mg total) by mouth at bedtime. 01/10/23   Nafziger, Darleene, NP  buPROPion  (WELLBUTRIN  SR) 150 MG 12 hr tablet Take 1 tablet (150 mg total) by mouth 2 (two) times daily. 01/09/23 07/02/23  Nafziger, Darleene, NP  busPIRone  (BUSPAR ) 5 MG tablet Take 1 tablet (5 mg total) by mouth 2 (two) times daily. 09/10/22   Nafziger, Darleene, NP  Colchicine  0.6 MG CAPS Take 1 tablet by mouth 2 (two) times daily as needed. 06/21/19   Hilts, Ozell, MD  cyclobenzaprine  (FLEXERIL ) 5 MG tablet Take 1 tablet (5 mg total) by mouth 3 (three) times daily as needed for Muscle spasms for up to 10 days. 01/16/23     diclofenac  (VOLTAREN ) 75 MG EC tablet Take 1 tablet (75 mg total) by mouth 2 (two) times daily as needed. 10/25/22     fenofibrate  (TRICOR ) 145 MG tablet Take 1 tablet (145 mg total) by mouth daily. 01/10/23    Nafziger, Darleene, NP  HYDROcodone -acetaminophen  (NORCO/VICODIN) 5-325 MG tablet Take 1 tablet by mouth every 12 (twelve) hours as needed for pain. 09/13/22     HYDROcodone -acetaminophen  (NORCO/VICODIN) 5-325 MG tablet Take 1 tablet by mouth daily for 1-2 weeks, then 1/2 tablet by mouth daily for a week then stop. 10/18/22     ibuprofen  (ADVIL ) 800 MG tablet Take 1 tablet (800 mg total) by mouth 3 (three) times daily with food as needed. 03/20/22     levothyroxine  (SYNTHROID ) 175 MCG tablet Take 1 tablet (175 mcg total) by mouth daily before breakfast. 05/28/23   Nafziger, Darleene, NP  lisinopril  (ZESTRIL ) 40 MG tablet Take 1 tablet (40 mg total) by mouth daily. 04/02/23   Nafziger, Darleene, NP  metFORMIN  (GLUCOPHAGE ) 500 MG tablet Take 1 tablet (500 mg total) by mouth daily. 01/10/23   Nafziger, Darleene, NP  nabumetone  (RELAFEN ) 750 MG tablet Take 1 tablet (750 mg total) by mouth 2 (two) times daily as needed. 09/17/19   Hilts, Ozell, MD  nitroGLYCERIN  (NITRO-DUR ) 0.1 mg/hr patch Apply 1/4 patch to affected area 12 hours daily 09/17/19   Hilts, Michael, MD  nystatin -triamcinolone  ointment (MYCOLOG) Apply 1 Application topically 2 (two) times daily. 09/20/21   Early, Sara E, NP  pantoprazole  (PROTONIX ) 40 MG tablet Take 1 tablet (40 mg total) by mouth  daily. 05/01/23   Nafziger, Cory, NP  pregabalin  (LYRICA ) 100 MG capsule Take one capsule (100 mg dose) by mouth 2 (two) times daily for 30 days. 04/15/23     sertraline  (ZOLOFT ) 100 MG tablet Take 2 tablets (200 mg total) by mouth daily. 01/10/23   Nafziger, Darleene, NP  sildenafil  (REVATIO ) 20 MG tablet Take 1-5 tablets (20-100 mg total) by mouth as needed 03/04/22     testosterone  cypionate (DEPOTESTOSTERONE CYPIONATE) 200 MG/ML injection Inject 0.3 mLs (60 mg total) into the muscle 2 (two) times a week (on Sundays and Wednesdays). Single use vials, discard after using. 01/16/23     testosterone  cypionate (DEPOTESTOTERONE CYPIONATE) 100 MG/ML injection Inject 1 mL (100 mg total)  into the muscle once a week. 09/06/20     tiZANidine  (ZANAFLEX ) 2 MG tablet Take 1-2 tablets (2-4 mg total) by mouth at bedtime. 08/17/21   Early, Sara E, NP  omeprazole  (PRILOSEC) 20 MG capsule Take 1 capsule (20 mg total) by mouth daily. 12/24/10 05/27/11  Watt Mirza, MD    Allergies: Chantix  [varenicline ]    Review of Systems  Constitutional:  Negative for chills and fever.  HENT:  Negative for congestion.   Eyes:  Negative for visual disturbance.  Respiratory:  Negative for shortness of breath.   Cardiovascular:  Negative for chest pain.  Gastrointestinal:  Negative for abdominal pain and vomiting.  Genitourinary:  Negative for dysuria and flank pain.  Musculoskeletal:  Positive for back pain. Negative for neck pain and neck stiffness.  Skin:  Negative for rash.  Neurological:  Negative for light-headedness and headaches.    Updated Vital Signs BP (!) 141/66 (BP Location: Left Arm)   Pulse 68   Temp 98.4 F (36.9 C) (Oral)   Resp 18   Ht 5' 6 (1.676 m)   Wt 90.7 kg   SpO2 97%   BMI 32.28 kg/m   Physical Exam Vitals and nursing note reviewed.  Constitutional:      General: He is not in acute distress.    Appearance: He is well-developed.  HENT:     Head: Normocephalic and atraumatic.     Mouth/Throat:     Mouth: Mucous membranes are moist.  Eyes:     General:        Right eye: No discharge.        Left eye: No discharge.     Conjunctiva/sclera: Conjunctivae normal.  Neck:     Trachea: No tracheal deviation.  Cardiovascular:     Rate and Rhythm: Normal rate.  Pulmonary:     Effort: Pulmonary effort is normal.  Abdominal:     General: There is no distension.     Palpations: Abdomen is soft.     Tenderness: There is no abdominal tenderness. There is no guarding.  Musculoskeletal:        General: Tenderness present. No swelling.     Cervical back: Normal range of motion and neck supple. No rigidity.     Comments: Patient has mild tenderness upper gluteus SI  joint on the left.  Patient can flex and extend hip knee and ankle without difficulty.  Sensation intact in left leg however patient feels more numb in the left proximal thigh.  2+ strong pulses distal left leg warm lower extremity.  No significant midline lumbar tenderness.  Skin:    General: Skin is warm.     Capillary Refill: Capillary refill takes less than 2 seconds.     Findings: No rash.  Neurological:  General: No focal deficit present.     Mental Status: He is alert.     Cranial Nerves: No cranial nerve deficit.  Psychiatric:        Mood and Affect: Mood normal.     (all labs ordered are listed, but only abnormal results are displayed) Labs Reviewed - No data to display  EKG: None  Radiology: No results found.   Procedures   Medications Ordered in the ED  ketorolac  (TORADOL ) 15 MG/ML injection 15 mg (has no administration in time range)                                    Medical Decision Making Risk Prescription drug management.   Patient presents with clinical concern for sciatica without significant weakness or neurologic compromise.  No evidence of acute vascular event.  Discussed concern for sciatica/mild disc herniation/muscular spasm.  Discussed supportive care outpatient follow-up Ortho, work note.  Toradol  ordered in the ER and Flexeril  as needed for muscle spasm.     Final diagnoses:  Acute back pain with sciatica, left    ED Discharge Orders     None          Tonia Chew, MD 11/02/23 1012

## 2023-11-02 NOTE — ED Notes (Signed)
 DC paperwork given and verbally understood.

## 2023-11-02 NOTE — Discharge Instructions (Signed)
 Use Tylenol  every 4 hours and ibuprofen  every 6 hours needed for pain.  Use heat or ice as needed for comfort. For muscle spasm that you cannot control with the above you can use Flexeril  but it makes you sleepy no driving with it.  Return for weakness in the leg, cold leg, uncontrolled pain or new concerns. Call orthopedics this week for appointment.

## 2023-11-02 NOTE — ED Triage Notes (Signed)
 a/ox4, pov. Pt c/o left leg pain starting 2 weeks ago. Starts in left hip and goes down upper thigh. Hx of chronic back pain. Home medications have not helped.

## 2023-11-03 ENCOUNTER — Telehealth: Payer: Self-pay

## 2023-11-03 NOTE — Transitions of Care (Post Inpatient/ED Visit) (Signed)
 11/03/2023  Name: Chad Wilson MRN: [id number] DOB: 1975/04/16  Today's TOC FU Call Status: Today's TOC FU Call Status:: Successful TOC FU Call Completed TOC FU Call Complete Date: 11/03/23 Patient's Name and Date of Birth confirmed.  Transition Care Management Follow-up Telephone Call Date of Discharge: 11/02/23 Discharge Facility: [facility] ([facility]) Type of Discharge: Emergency Department How have you been since you were released from the hospital?: Better Any questions or concerns?: No  Items Reviewed: Did you receive and understand the discharge instructions provided?: Yes Medications obtained,verified, and reconciled?: Yes (Medications Reviewed) Any new allergies since your discharge?: No  Medications Reviewed Today: Medications Reviewed Today     Reviewed by Noelie Renfrow, CMA (Certified Medical Assistant) on 11/03/23 at 1359  Med List Status: <None>   Medication Order Taking? Sig Documenting Provider Last Dose Status Informant  albuterol  (VENTOLIN  HFA) 108 (90 Base) MCG/ACT inhaler [id number] Yes Inhale 2 puffs into the lungs every 6 (six) hours as needed. Nafziger, Darleene, NP  Active   ALPRAZolam  (XANAX ) 0.5 MG tablet [id number] Yes Take 1 tablet (0.5 mg total) by mouth 2 (two) times daily. Nafziger, Darleene, NP  Active   atorvastatin  (LIPITOR) 40 MG tablet [id number] Yes Take 1 tablet (40 mg total) by mouth at bedtime. Nafziger, Darleene, NP  Active   buPROPion  (WELLBUTRIN  SR) 150 MG 12 hr tablet [id number] Yes Take 1 tablet (150 mg total) by mouth 2 (two) times daily. Nafziger, Darleene, NP  Active   busPIRone  (BUSPAR ) 5 MG tablet [id number] Yes Take 1 tablet (5 mg total) by mouth 2 (two) times daily. Nafziger, Darleene, NP  Active   Colchicine  0.6 MG CAPS [id number] Yes Take 1 tablet by mouth 2 (two) times daily as needed. Hilts, Michael, MD  Active   cyclobenzaprine  (FLEXERIL ) 10 MG tablet [id number]  Take 1 tablet (10 mg total) by mouth 2 (two) times daily as needed for muscle spasms.   Patient not taking: Reported on 11/03/2023   Tonia Chew, MD  Active   cyclobenzaprine  (FLEXERIL ) 5 MG tablet [id number] Yes Take 1 tablet (5 mg total) by mouth 3 (three) times daily as needed for Muscle spasms for up to 10 days.   Active   diclofenac  (VOLTAREN ) 75 MG EC tablet [id number] Yes Take 1 tablet (75 mg total) by mouth 2 (two) times daily as needed.   Active   fenofibrate  (TRICOR ) 145 MG tablet [id number] Yes Take 1 tablet (145 mg total) by mouth daily. Nafziger, Darleene, NP  Active   HYDROcodone -acetaminophen  (NORCO/VICODIN) 5-325 MG tablet [id number]  Take 1 tablet by mouth every 12 (twelve) hours as needed for pain.  Patient not taking: Reported on 11/03/2023     Active   HYDROcodone -acetaminophen  (NORCO/VICODIN) 5-325 MG tablet [id number]  Take 1 tablet by mouth daily for 1-2 weeks, then 1/2 tablet by mouth daily for a week then stop.  Patient not taking: Reported on 11/03/2023     Active   ibuprofen  (ADVIL ) 800 MG tablet [id number] Yes Take 1 tablet (800 mg total) by mouth 3 (three) times daily with food as needed.   Active   levothyroxine  (SYNTHROID ) 175 MCG tablet [id number] Yes Take 1 tablet (175 mcg total) by mouth daily before breakfast. Merna Darleene, NP  Active   lisinopril  (ZESTRIL ) 40 MG tablet [id number] Yes Take 1 tablet (40 mg total) by mouth daily. Nafziger, Darleene, NP  Active   metFORMIN  (GLUCOPHAGE ) 500 MG tablet [id number] Yes Take 1 tablet (500 mg total) by mouth daily. Merna Darleene, NP  Active  nabumetone  (RELAFEN ) 750 MG tablet [id number] Yes Take 1 tablet (750 mg total) by mouth 2 (two) times daily as needed. Hilts, Michael, MD  Active   nitroGLYCERIN  (NITRO-DUR ) 0.1 mg/hr patch [id number] Yes Apply 1/4 patch to affected area 12 hours daily Hilts, Michael, MD  Active   nystatin -triamcinolone  ointment [facility]) [id number] Yes Apply 1 Application topically 2 (two) times daily. Early, Sara E, NP  Active     Discontinued 05/27/11 1122   pantoprazole  (PROTONIX ) 40 MG tablet  [id number] Yes Take 1 tablet (40 mg total) by mouth daily. Nafziger, Darleene, NP  Active   pregabalin  (LYRICA ) 100 MG capsule [id number] Yes Take one capsule (100 mg dose) by mouth 2 (two) times daily for 30 days.   Active   sertraline  (ZOLOFT ) 100 MG tablet [id number] Yes Take 2 tablets (200 mg total) by mouth daily. Nafziger, Darleene, NP  Active   sildenafil  (REVATIO ) 20 MG tablet [id number] Yes Take 1-5 tablets (20-100 mg total) by mouth as needed   Active   testosterone  cypionate (DEPOTESTOSTERONE CYPIONATE) 200 MG/ML injection [id number] Yes Inject 0.3 mLs (60 mg total) into the muscle 2 (two) times a week (on Sundays and Wednesdays). Single use vials, discard after using.   Active   testosterone  cypionate (DEPOTESTOTERONE CYPIONATE) 100 MG/ML injection [id number] Yes Inject 1 mL (100 mg total) into the muscle once a week.   Active   tiZANidine  (ZANAFLEX ) 2 MG tablet [id number]  Take 1-2 tablets (2-4 mg total) by mouth at bedtime.  Patient not taking: Reported on 11/03/2023   Early, Camie BRAVO, NP  Active   Med List Note Broadus Reda CROME, RN 08/21/20 1538): UDS 08-21-20 MR 11-19-20            Home Care and Equipment/Supplies:    Functional Questionnaire:    Follow up appointments reviewed: PCP Follow-up appointment confirmed?: Yes Date of PCP follow-up appointment?: 11/11/23 Follow-up Provider: Darleene Shape, NP Specialist Hospital Follow-up appointment confirmed?: Yes Date of Specialist follow-up appointment?: 11/10/23 Follow-Up Specialty Provider:: ortho Do you need transportation to your follow-up appointment?: No Do you understand care options if your condition(s) worsen?: Yes-patient verbalized understanding    SIGNATURE: Willeen Craver, CMA

## 2023-11-10 ENCOUNTER — Other Ambulatory Visit (HOSPITAL_BASED_OUTPATIENT_CLINIC_OR_DEPARTMENT_OTHER): Payer: Self-pay

## 2023-11-10 MED ORDER — CYCLOBENZAPRINE HCL 10 MG PO TABS
5.0000 mg | ORAL_TABLET | Freq: Three times a day (TID) | ORAL | 0 refills | Status: AC | PRN
  Filled 2023-11-10: qty 30, 10d supply, fill #0

## 2023-11-10 MED ORDER — METHYLPREDNISOLONE 4 MG PO TBPK
ORAL_TABLET | ORAL | 0 refills | Status: AC
  Filled 2023-11-10: qty 21, 6d supply, fill #0

## 2023-11-11 ENCOUNTER — Inpatient Hospital Stay: Admitting: Adult Health

## 2023-11-12 ENCOUNTER — Other Ambulatory Visit: Payer: Self-pay

## 2023-11-12 ENCOUNTER — Telehealth: Payer: Self-pay | Admitting: Adult Health

## 2023-11-12 NOTE — Telephone Encounter (Signed)
 As of April 07 2023 patient has missed 3 appointments which qualifies for dismissal. Please advise as to what the provider would like to do. Thanks!

## 2023-11-12 NOTE — Telephone Encounter (Signed)
**Note De-identified  [place] Obfuscation** Please advise 

## 2023-11-18 ENCOUNTER — Encounter: Payer: Self-pay | Admitting: Adult Health

## 2023-11-18 ENCOUNTER — Inpatient Hospital Stay: Admitting: Adult Health

## 2023-12-15 ENCOUNTER — Other Ambulatory Visit (HOSPITAL_BASED_OUTPATIENT_CLINIC_OR_DEPARTMENT_OTHER): Payer: Self-pay

## 2023-12-22 ENCOUNTER — Other Ambulatory Visit (HOSPITAL_BASED_OUTPATIENT_CLINIC_OR_DEPARTMENT_OTHER): Payer: Self-pay

## 2023-12-22 ENCOUNTER — Other Ambulatory Visit: Payer: Self-pay

## 2023-12-31 ENCOUNTER — Other Ambulatory Visit (HOSPITAL_BASED_OUTPATIENT_CLINIC_OR_DEPARTMENT_OTHER): Payer: Self-pay

## 2024-01-13 ENCOUNTER — Other Ambulatory Visit: Payer: Self-pay | Admitting: Adult Health

## 2024-01-13 ENCOUNTER — Emergency Department (HOSPITAL_BASED_OUTPATIENT_CLINIC_OR_DEPARTMENT_OTHER)
Admission: EM | Admit: 2024-01-13 | Discharge: 2024-01-13 | Disposition: A | Discharge status: Home / Self Care | Payer: Self-pay | Attending: Emergency Medicine | Admitting: Emergency Medicine

## 2024-01-13 ENCOUNTER — Other Ambulatory Visit (HOSPITAL_BASED_OUTPATIENT_CLINIC_OR_DEPARTMENT_OTHER): Payer: Self-pay

## 2024-01-13 ENCOUNTER — Other Ambulatory Visit: Payer: Self-pay

## 2024-01-13 DIAGNOSIS — L02411 Cutaneous abscess of right axilla: Secondary | ICD-10-CM | POA: Insufficient documentation

## 2024-01-13 DIAGNOSIS — Z79899 Other long term (current) drug therapy: Secondary | ICD-10-CM | POA: Insufficient documentation

## 2024-01-13 DIAGNOSIS — F419 Anxiety disorder, unspecified: Secondary | ICD-10-CM

## 2024-01-13 DIAGNOSIS — I10 Essential (primary) hypertension: Secondary | ICD-10-CM | POA: Insufficient documentation

## 2024-01-13 DIAGNOSIS — Z79890 Hormone replacement therapy (postmenopausal): Secondary | ICD-10-CM | POA: Insufficient documentation

## 2024-01-13 DIAGNOSIS — E039 Hypothyroidism, unspecified: Secondary | ICD-10-CM | POA: Insufficient documentation

## 2024-01-13 DIAGNOSIS — L02419 Cutaneous abscess of limb, unspecified: Secondary | ICD-10-CM

## 2024-01-13 MED ORDER — SULFAMETHOXAZOLE-TRIMETHOPRIM 800-160 MG PO TABS
1.0000 | ORAL_TABLET | Freq: Two times a day (BID) | ORAL | 0 refills | Status: AC
Start: 2024-01-13 — End: 2024-01-28
  Filled 2024-01-13 – 2024-01-23 (×2): qty 10, 5d supply, fill #0

## 2024-01-13 MED ORDER — LIDOCAINE-EPINEPHRINE (PF) 2 %-1:200000 IJ SOLN
10.0000 mL | Freq: Once | INTRAMUSCULAR | Status: AC
Start: 2024-01-13 — End: 2024-01-13
  Administered 2024-01-13: 10 mL via INTRADERMAL
  Filled 2024-01-13: qty 20

## 2024-01-13 NOTE — Telephone Encounter (Signed)
 Copied from CRM [id number]. Topic: Clinical - Medication Refill >> Jan 13, 2024 11:16 AM Vena HERO wrote: Medication: ALPRAZolam  (XANAX ) 0.5 MG tablet  Has the patient contacted their pharmacy? Yes (Agent: If no, request that the patient contact the pharmacy for the refill. If patient does not wish to contact the pharmacy document the reason why and proceed with request.) (Agent: If yes, when and what did the pharmacy advise?)  This is the patient's preferred pharmacy:   [facility] [address] [place] [address] Phone: [phone] Fax: [phone]   Is this the correct pharmacy for this prescription? Yes If no, delete pharmacy and type the correct one.   Has the prescription been filled recently? No  Is the patient out of the medication? Yes  Has the patient been seen for an appointment in the last year OR does the patient have an upcoming appointment? No  Can we respond through MyChart? Yes  Agent: Please be advised that Rx refills may take up to 3 business days. We ask that you follow-up with your pharmacy.

## 2024-01-13 NOTE — Discharge Instructions (Signed)
 You were seen in the ER today for concerns of an infection in your right underarm. This was an abscess that was seen on ultrasound. This was drained for you but given the redness surrounding the area, I have started you on antibiotics. Please take this as prescribed for the next several days. Return to the ER for any concerns of new or worsening symptoms

## 2024-01-13 NOTE — ED Triage Notes (Signed)
 Reports boil in R armpit since Friday. Denies fevers.

## 2024-01-13 NOTE — ED Provider Notes (Signed)
 [place] EMERGENCY DEPARTMENT AT [facility] Provider Note   CSN: [id number] Arrival date & time: 01/13/24  1146     Patient presents with: Recurrent Skin Infections   Elie E Feng is a 48 y.o. male.  Patient presents the emergency department concerns of an abscess.  Patient is noticing what he describes as a boil to the right underarm over the last 4 days.  No fever, chills, body aches.  He reports clear drainage coming from the area but denies any purulent discharge.  Denies any recent history of similar findings.  Attempted to drain the area at home but was unable to.  HPI     Prior to Admission medications   Medication Sig Start Date End Date Taking? Authorizing Provider  sulfamethoxazole-trimethoprim (BACTRIM DS) 800-160 MG tablet Take 1 tablet by mouth 2 (two) times daily for 5 days. 01/13/24 01/18/24 Yes Shelsea Hangartner A, PA-C  albuterol  (VENTOLIN  HFA) 108 (90 Base) MCG/ACT inhaler Inhale 2 puffs into the lungs every 6 (six) hours as needed. 05/01/23   Nafziger, Darleene, NP  ALPRAZolam  (XANAX ) 0.5 MG tablet Take 1 tablet (0.5 mg total) by mouth 2 (two) times daily. 10/15/23   Nafziger, Darleene, NP  atorvastatin  (LIPITOR) 40 MG tablet Take 1 tablet (40 mg total) by mouth at bedtime. 01/10/23   Nafziger, Darleene, NP  buPROPion  (WELLBUTRIN  SR) 150 MG 12 hr tablet Take 1 tablet (150 mg total) by mouth 2 (two) times daily. 01/09/23 01/30/24  Nafziger, Darleene, NP  busPIRone  (BUSPAR ) 5 MG tablet Take 1 tablet (5 mg total) by mouth 2 (two) times daily. 09/10/22   Nafziger, Darleene, NP  Colchicine  0.6 MG CAPS Take 1 tablet by mouth 2 (two) times daily as needed. 06/21/19   Hilts, Ozell, MD  cyclobenzaprine  (FLEXERIL ) 10 MG tablet Take 1 tablet (10 mg total) by mouth 2 (two) times daily as needed for muscle spasms. Patient not taking: Reported on 11/03/2023 11/02/23   Tonia Chew, MD  cyclobenzaprine  (FLEXERIL ) 10 MG tablet Take 0.5-1 tablets (5-10 mg total) by mouth 2 (two) to 3 (three) times  daily as needed. 11/10/23     cyclobenzaprine  (FLEXERIL ) 5 MG tablet Take 1 tablet (5 mg total) by mouth 3 (three) times daily as needed for Muscle spasms for up to 10 days. 01/16/23     diclofenac  (VOLTAREN ) 75 MG EC tablet Take 1 tablet (75 mg total) by mouth 2 (two) times daily as needed. 10/25/22     fenofibrate  (TRICOR ) 145 MG tablet Take 1 tablet (145 mg total) by mouth daily. 01/10/23   Nafziger, Darleene, NP  HYDROcodone -acetaminophen  (NORCO/VICODIN) 5-325 MG tablet Take 1 tablet by mouth every 12 (twelve) hours as needed for pain. Patient not taking: Reported on 11/03/2023 09/13/22     HYDROcodone -acetaminophen  (NORCO/VICODIN) 5-325 MG tablet Take 1 tablet by mouth daily for 1-2 weeks, then 1/2 tablet by mouth daily for a week then stop. Patient not taking: Reported on 11/03/2023 10/18/22     ibuprofen  (ADVIL ) 800 MG tablet Take 1 tablet (800 mg total) by mouth 3 (three) times daily with food as needed. 03/20/22     levothyroxine  (SYNTHROID ) 175 MCG tablet Take 1 tablet (175 mcg total) by mouth daily before breakfast. 05/28/23   Nafziger, Darleene, NP  lisinopril  (ZESTRIL ) 40 MG tablet Take 1 tablet (40 mg total) by mouth daily. 04/02/23   Nafziger, Darleene, NP  metFORMIN  (GLUCOPHAGE ) 500 MG tablet Take 1 tablet (500 mg total) by mouth daily. 01/10/23   Merna Darleene, NP  methylPREDNISolone  (MEDROL ) 4 MG TBPK tablet Take as directed on pack over 6 days 11/10/23     nabumetone  (RELAFEN ) 750 MG tablet Take 1 tablet (750 mg total) by mouth 2 (two) times daily as needed. 09/17/19   Hilts, Ozell, MD  nitroGLYCERIN  (NITRO-DUR ) 0.1 mg/hr patch Apply 1/4 patch to affected area 12 hours daily 09/17/19   Hilts, Michael, MD  nystatin -triamcinolone  ointment (MYCOLOG) Apply 1 Application topically 2 (two) times daily. 09/20/21   Early, Sara E, NP  pantoprazole  (PROTONIX ) 40 MG tablet Take 1 tablet (40 mg total) by mouth daily. 05/01/23   Nafziger, Darleene, NP  pregabalin  (LYRICA ) 100 MG capsule Take one capsule (100 mg dose) by mouth  2 (two) times daily for 30 days. 04/15/23     sertraline  (ZOLOFT ) 100 MG tablet Take 2 tablets (200 mg total) by mouth daily. 01/10/23   Nafziger, Darleene, NP  sildenafil  (REVATIO ) 20 MG tablet Take 1-5 tablets (20-100 mg total) by mouth as needed 03/04/22     testosterone  cypionate (DEPOTESTOSTERONE CYPIONATE) 200 MG/ML injection Inject 0.3 mLs (60 mg total) into the muscle 2 (two) times a week (on Sundays and Wednesdays). Single use vials, discard after using. 01/16/23     testosterone  cypionate (DEPOTESTOTERONE CYPIONATE) 100 MG/ML injection Inject 1 mL (100 mg total) into the muscle once a week. 09/06/20     tiZANidine  (ZANAFLEX ) 2 MG tablet Take 1-2 tablets (2-4 mg total) by mouth at bedtime. Patient not taking: Reported on 11/03/2023 08/17/21   Early, Sara E, NP  omeprazole  (PRILOSEC) 20 MG capsule Take 1 capsule (20 mg total) by mouth daily. 12/24/10 05/27/11  Watt Mirza, MD    Allergies: Chantix  [varenicline ]    Review of Systems  Skin:  Positive for rash.  All other systems reviewed and are negative.   Updated Vital Signs BP (!) 114/54   Pulse 66   Temp 97.6 F (36.4 C) (Oral)   Resp 17   SpO2 99%   Physical Exam Vitals and nursing note reviewed.  Constitutional:      General: He is not in acute distress.    Appearance: He is well-developed.  HENT:     Head: Normocephalic and atraumatic.  Eyes:     Conjunctiva/sclera: Conjunctivae normal.  Cardiovascular:     Rate and Rhythm: Normal rate and regular rhythm.     Heart sounds: No murmur heard. Pulmonary:     Effort: Pulmonary effort is normal. No respiratory distress.     Breath sounds: Normal breath sounds.  Abdominal:     Palpations: Abdomen is soft.     Tenderness: There is no abdominal tenderness.  Musculoskeletal:        General: No swelling.     Cervical back: Neck supple.  Skin:    General: Skin is warm and dry.     Capillary Refill: Capillary refill takes less than 2 seconds.     Findings: Lesion present.      Comments: Right axillary abscess present. Fluctuance appreciated. See included picture.  Neurological:     Mental Status: He is alert.  Psychiatric:        Mood and Affect: Mood normal.     (all labs ordered are listed, but only abnormal results are displayed) Labs Reviewed - No data to display  EKG: None  Radiology: No results found.   .Incision and Drainage  Date/Time: 01/13/2024 2:25 PM  Performed by: Mandee Pluta A, PA-C Authorized by: Aaliyah Cancro A, PA-C   Consent:  Consent obtained:  Verbal   Consent given by:  Patient   Risks, benefits, and alternatives were discussed: yes     Risks discussed:  Bleeding, incomplete drainage and infection   Alternatives discussed:  Alternative treatment Universal protocol:    Imaging studies available: yes     Patient identity confirmed:  Verbally with patient, arm band and provided demographic data Location:    Type:  Abscess   Size:  3cm   Location:  Upper extremity   Upper extremity location: Right axilla. Pre-procedure details:    Skin preparation:  Antiseptic wash and chlorhexidine  Sedation:    Sedation type:  None Anesthesia:    Anesthesia method:  Local infiltration   Local anesthetic:  Lidocaine  2% WITH epi Procedure type:    Complexity:  Simple Procedure details:    Ultrasound guidance: no     Needle aspiration: no     Incision types:  Stab incision   Wound management:  Probed and deloculated, irrigated with saline, extensive cleaning and debrided   Drainage:  Purulent   Drainage amount:  Moderate   Wound treatment:  Wound left open   Packing materials:  None Post-procedure details:    Procedure completion:  Tolerated .Ultrasound ED Soft Tissue  Date/Time: 01/13/2024 2:26 PM  Performed by: Krishan Mcbreen A, PA-C Authorized by: Benjimin Hadden A, PA-C   Procedure details:    Indications: localization of abscess     Transverse view:  Visualized   Longitudinal view:  Visualized   Images: archived    Location:    Location: axilla     Side:  Right Findings:     abscess present    no foreign body present    Medications Ordered in the ED  lidocaine -EPINEPHrine (XYLOCAINE  W/EPI) 2 %-1:200000 (PF) injection 10 mL (10 mLs Intradermal Given 01/13/24 1333)                                    Medical Decision Making Risk Prescription drug management.   This patient presents to the ED for concern of skin infection, this involves an extensive number of treatment options, and is a complaint that carries with it a high risk of complications and morbidity.  The differential diagnosis includes abscess, cellulitis, cyst, lymphadenopathy   Co morbidities that complicate the patient evaluation  History of hepatitis C, hypothyroidism, hypertension   Additional history obtained:  Additional history obtained from epic chart review   Consultations Obtained:  I requested consultation with none,  and discussed lab and imaging findings as well as pertinent plan - they recommend: N/A   Problem List / ED Course / Critical interventions / Medication management  Patient with past history significant for hepatitis C, hypothyroidism, hypertension presents to the emergency department with concerns of underarm infection.  Reports he noticed an area of swelling with some clear drainage in the last day or so development of the right underarm.  Denies any fever, chills, body aches.  No history of similar symptoms. Exam reveals an area of fluctuance and swelling to the right axilla.  There is to be clear serous fluid draining through this area.  Bedside ultrasound performed showing appearance of abscess.  Patient consented to drainage. Incision and drainage made to right underarm with a stab incision due to area of drainage already present.  Patient tolerated well.  Wound irrigated and deloculated. Given area of erythema and induration surrounding the wound, start  patient on course of antibiotics to  prevent secondary infection or progressive cellulitis.  Advised patient return to the emergency department for any concerns of new or worsening symptoms.  He is otherwise able for outpatient follow-up and discharged home. I ordered medication including lidocaine  with epinephrine for anesthetic Reevaluation of the patient after these medicines showed that the patient improved I have reviewed the patients home medicines and have made adjustments as needed   Social Determinants of Health:  None   Test / Admission - Considered:  Stable for outpatient follow-up  Final diagnoses:  Axillary abscess    ED Discharge Orders          Ordered    sulfamethoxazole-trimethoprim (BACTRIM DS) 800-160 MG tablet  2 times daily        01/13/24 1409               Shekinah Pitones A, PA-C 01/13/24 1427    Cottie Donnice PARAS, MD 01/13/24 1807

## 2024-01-15 ENCOUNTER — Encounter (HOSPITAL_BASED_OUTPATIENT_CLINIC_OR_DEPARTMENT_OTHER): Payer: Self-pay

## 2024-01-15 ENCOUNTER — Other Ambulatory Visit (HOSPITAL_BASED_OUTPATIENT_CLINIC_OR_DEPARTMENT_OTHER): Payer: Self-pay

## 2024-01-23 ENCOUNTER — Other Ambulatory Visit (HOSPITAL_BASED_OUTPATIENT_CLINIC_OR_DEPARTMENT_OTHER): Payer: Self-pay

## 2024-02-03 ENCOUNTER — Other Ambulatory Visit (HOSPITAL_COMMUNITY): Payer: Self-pay | Admitting: Family Medicine

## 2024-02-03 DIAGNOSIS — J410 Simple chronic bronchitis: Secondary | ICD-10-CM

## 2024-02-04 ENCOUNTER — Telehealth: Payer: Self-pay | Admitting: Radiology

## 2024-02-04 NOTE — Telephone Encounter (Addendum)
 He only went for one visit of PT in 2023 when Dr Joane refered Dr Margrette would like for him to go for full therapy visits before he will see him and make sure patient is taking ibuprofen  daily as well He was previously seen by Dr Genelle as well in 2023 and referred to Neurosurgery  I called patient to advise. LM  I will send this message to Dr Joane so he is aware also

## 2024-02-04 NOTE — Telephone Encounter (Signed)
-----   Message from [facility] sent at 02/04/2024  9:15 AM EST ----- HELP... ----- Message ----- From: Margrette Taft BRAVO, MD Sent: 02/04/2024   8:22 AM EST To: Hope H Surrett  Needs PT and 6 weeks of nsaids bef appt w ortho

## 2024-02-04 NOTE — Telephone Encounter (Signed)
-----   Message from [facility] sent at 02/04/2024  9:15 AM EST ----- HELP... ----- Message ----- From: Margrette Taft BRAVO, MD Sent: 02/04/2024   8:22 AM EST To: Hope H Surrett  Needs PT and 6 weeks of nsaids bef appt w ortho
